# Patient Record
Sex: Female | Born: 1995 | Race: Black or African American | Hispanic: No | Marital: Single | Smoking: Never smoker
Health system: Southern US, Community
[De-identification: ages and names within clinical notes are randomized; demographics above are authoritative.]

## PROBLEM LIST (undated history)

## (undated) DIAGNOSIS — I10 Essential (primary) hypertension: Secondary | ICD-10-CM

## (undated) DIAGNOSIS — M5430 Sciatica, unspecified side: Secondary | ICD-10-CM

## (undated) DIAGNOSIS — D508 Other iron deficiency anemias: Secondary | ICD-10-CM

## (undated) DIAGNOSIS — E119 Type 2 diabetes mellitus without complications: Secondary | ICD-10-CM

## (undated) DIAGNOSIS — F329 Major depressive disorder, single episode, unspecified: Secondary | ICD-10-CM

## (undated) DIAGNOSIS — N926 Irregular menstruation, unspecified: Secondary | ICD-10-CM

## (undated) DIAGNOSIS — F32A Depression, unspecified: Secondary | ICD-10-CM

## (undated) DIAGNOSIS — M2550 Pain in unspecified joint: Secondary | ICD-10-CM

## (undated) HISTORY — DX: Other iron deficiency anemias: D50.8

## (undated) HISTORY — DX: Major depressive disorder, single episode, unspecified: F32.9

## (undated) HISTORY — DX: Depression, unspecified: F32.A

## (undated) HISTORY — DX: Type 2 diabetes mellitus without complications: E11.9

## (undated) HISTORY — DX: Pain in unspecified joint: M25.50

## (undated) HISTORY — DX: Essential (primary) hypertension: I10

## (undated) HISTORY — DX: Irregular menstruation, unspecified: N92.6

---

## 2014-01-02 ENCOUNTER — Emergency Department: Payer: Self-pay | Admitting: Emergency Medicine

## 2014-01-02 LAB — URINALYSIS, COMPLETE
BACTERIA: NONE SEEN
Bilirubin,UR: NEGATIVE
Blood: NEGATIVE
GLUCOSE, UR: NEGATIVE mg/dL (ref 0–75)
Ketone: NEGATIVE
Nitrite: NEGATIVE
Ph: 6 (ref 4.5–8.0)
RBC,UR: NONE SEEN /HPF (ref 0–5)
SPECIFIC GRAVITY: 1.028 (ref 1.003–1.030)
WBC UR: 9 /HPF (ref 0–5)

## 2014-01-02 LAB — CBC
HCT: 41.4 % (ref 35.0–47.0)
HGB: 13.3 g/dL (ref 12.0–16.0)
MCH: 27.9 pg (ref 26.0–34.0)
MCHC: 32.1 g/dL (ref 32.0–36.0)
MCV: 87 fL (ref 80–100)
Platelet: 265 10*3/uL (ref 150–440)
RBC: 4.75 10*6/uL (ref 3.80–5.20)
RDW: 15.5 % — ABNORMAL HIGH (ref 11.5–14.5)
WBC: 7.2 10*3/uL (ref 3.6–11.0)

## 2014-01-02 LAB — COMPREHENSIVE METABOLIC PANEL
ALBUMIN: 3.7 g/dL (ref 3.4–5.0)
ALT: 29 U/L
ANION GAP: 9 (ref 7–16)
AST: 22 U/L (ref 15–37)
Alkaline Phosphatase: 68 U/L
BILIRUBIN TOTAL: 0.3 mg/dL (ref 0.2–1.0)
BUN: 5 mg/dL — ABNORMAL LOW (ref 7–18)
CHLORIDE: 102 mmol/L (ref 98–107)
CO2: 25 mmol/L (ref 21–32)
CREATININE: 0.69 mg/dL (ref 0.60–1.30)
Calcium, Total: 8.5 mg/dL (ref 8.5–10.1)
EGFR (African American): >60
Glucose: 147 mg/dL — ABNORMAL HIGH (ref 65–99)
Osmolality: 272 (ref 275–301)
Potassium: 3.8 mmol/L (ref 3.5–5.1)
Sodium: 136 mmol/L (ref 136–145)
Total Protein: 7.3 g/dL (ref 6.4–8.2)

## 2014-01-02 LAB — HCG, QUANTITATIVE, PREGNANCY: Beta Hcg, Quant.: 26029 m[IU]/mL — ABNORMAL HIGH

## 2014-01-02 LAB — ETHANOL

## 2014-01-02 LAB — DRUG SCREEN, URINE

## 2014-01-02 LAB — WET PREP, GENITAL

## 2014-01-02 LAB — ACETAMINOPHEN LEVEL: Acetaminophen: <2 ug/mL

## 2014-01-02 LAB — GC/CHLAMYDIA PROBE AMP

## 2014-01-02 LAB — SALICYLATE LEVEL: Salicylates, Serum: <1.7 mg/dL

## 2014-01-02 LAB — TSH: THYROID STIMULATING HORM: 0.76 u[IU]/mL

## 2014-01-07 ENCOUNTER — Ambulatory Visit: Payer: Self-pay | Admitting: Nurse Practitioner

## 2014-01-11 ENCOUNTER — Ambulatory Visit: Payer: Self-pay | Admitting: Nurse Practitioner

## 2014-02-11 ENCOUNTER — Ambulatory Visit: Payer: Self-pay | Admitting: Nurse Practitioner

## 2014-02-20 ENCOUNTER — Encounter: Payer: Self-pay | Admitting: Maternal & Fetal Medicine

## 2014-03-05 ENCOUNTER — Emergency Department: Payer: Self-pay | Admitting: Emergency Medicine

## 2014-03-05 LAB — COMPREHENSIVE METABOLIC PANEL
AST: 34 U/L (ref 15–37)
Albumin: 3.3 g/dL — ABNORMAL LOW (ref 3.4–5.0)
Alkaline Phosphatase: 60 U/L
Anion Gap: 6 — ABNORMAL LOW (ref 7–16)
BILIRUBIN TOTAL: 0.3 mg/dL (ref 0.2–1.0)
BUN: 7 mg/dL (ref 7–18)
Calcium, Total: 8.3 mg/dL — ABNORMAL LOW (ref 8.5–10.1)
Chloride: 106 mmol/L (ref 98–107)
Co2: 24 mmol/L (ref 21–32)
Creatinine: 0.45 mg/dL — ABNORMAL LOW (ref 0.60–1.30)
Glucose: 103 mg/dL — ABNORMAL HIGH (ref 65–99)
Osmolality: 270 (ref 275–301)
POTASSIUM: 4.2 mmol/L (ref 3.5–5.1)
SGPT (ALT): 47 U/L
Sodium: 136 mmol/L (ref 136–145)
TOTAL PROTEIN: 6.8 g/dL (ref 6.4–8.2)

## 2014-03-05 LAB — CBC WITH DIFFERENTIAL/PLATELET
BASOS ABS: 0.1 10*3/uL (ref 0.0–0.1)
Basophil %: 0.5 %
EOS ABS: 0.1 10*3/uL (ref 0.0–0.7)
Eosinophil %: 1 %
HCT: 37.7 % (ref 35.0–47.0)
HGB: 11.9 g/dL — ABNORMAL LOW (ref 12.0–16.0)
LYMPHS ABS: 2.5 10*3/uL (ref 1.0–3.6)
Lymphocyte %: 21.8 %
MCH: 27.5 pg (ref 26.0–34.0)
MCHC: 31.7 g/dL — ABNORMAL LOW (ref 32.0–36.0)
MCV: 87 fL (ref 80–100)
Monocyte #: 0.5 x10 3/mm (ref 0.2–0.9)
Monocyte %: 4.7 %
NEUTROS ABS: 8.1 10*3/uL — AB (ref 1.4–6.5)
Neutrophil %: 72 %
PLATELETS: 190 10*3/uL (ref 150–440)
RBC: 4.35 10*6/uL (ref 3.80–5.20)
RDW: 15.3 % — ABNORMAL HIGH (ref 11.5–14.5)
WBC: 11.3 10*3/uL — ABNORMAL HIGH (ref 3.6–11.0)

## 2014-03-05 LAB — URINALYSIS, COMPLETE
BLOOD: NEGATIVE
Bilirubin,UR: NEGATIVE
GLUCOSE, UR: NEGATIVE mg/dL (ref 0–75)
Ketone: NEGATIVE
Leukocyte Esterase: NEGATIVE
NITRITE: NEGATIVE
Ph: 7 (ref 4.5–8.0)
Protein: NEGATIVE
RBC,UR: 2 /HPF (ref 0–5)
SPECIFIC GRAVITY: 1.02 (ref 1.003–1.030)
Squamous Epithelial: 5
WBC UR: 3 /HPF (ref 0–5)

## 2014-03-05 LAB — HCG, QUANTITATIVE, PREGNANCY: Beta Hcg, Quant.: 27397 m[IU]/mL — ABNORMAL HIGH

## 2014-03-13 ENCOUNTER — Ambulatory Visit: Payer: Self-pay | Admitting: Nurse Practitioner

## 2014-03-20 ENCOUNTER — Encounter: Payer: Self-pay | Admitting: Obstetrics & Gynecology

## 2014-03-23 ENCOUNTER — Emergency Department: Payer: Self-pay | Admitting: Emergency Medicine

## 2014-03-23 LAB — URINALYSIS, COMPLETE
BILIRUBIN, UR: NEGATIVE
Bacteria: NONE SEEN
Blood: NEGATIVE
Glucose,UR: NEGATIVE mg/dL (ref 0–75)
KETONE: NEGATIVE
LEUKOCYTE ESTERASE: NEGATIVE
NITRITE: NEGATIVE
PH: 6 (ref 4.5–8.0)
Protein: NEGATIVE
RBC,UR: 2 /HPF (ref 0–5)
Specific Gravity: 1.018 (ref 1.003–1.030)
Squamous Epithelial: 8
WBC UR: 4 /HPF (ref 0–5)

## 2014-03-23 LAB — WET PREP, GENITAL

## 2014-03-23 LAB — GC/CHLAMYDIA PROBE AMP

## 2014-03-25 LAB — URINE CULTURE

## 2014-04-03 ENCOUNTER — Encounter
Admit: 2014-04-03 | Disposition: A | Discharge status: Home / Self Care | Payer: Self-pay | Admitting: Obstetrics and Gynecology

## 2014-04-10 ENCOUNTER — Encounter: Payer: Self-pay | Admitting: Maternal & Fetal Medicine

## 2014-05-23 ENCOUNTER — Observation Stay: Payer: Self-pay | Admitting: Obstetrics and Gynecology

## 2014-05-23 LAB — CBC WITH DIFFERENTIAL/PLATELET
Basophil #: 0 10*3/uL (ref 0.0–0.1)
Basophil %: 0.3 %
Eosinophil #: 0.1 10*3/uL (ref 0.0–0.7)
Eosinophil %: 1.2 %
HCT: 34.2 % — ABNORMAL LOW (ref 35.0–47.0)
HGB: 11.1 g/dL — ABNORMAL LOW (ref 12.0–16.0)
Lymphocyte #: 1.8 10*3/uL (ref 1.0–3.6)
Lymphocyte %: 20 %
MCH: 28.4 pg (ref 26.0–34.0)
MCHC: 32.4 g/dL (ref 32.0–36.0)
MCV: 88 fL (ref 80–100)
Monocyte #: 0.5 x10 3/mm (ref 0.2–0.9)
Monocyte %: 5.5 %
NEUTROS ABS: 6.6 10*3/uL — AB (ref 1.4–6.5)
NEUTROS PCT: 73 %
Platelet: 194 10*3/uL (ref 150–440)
RBC: 3.9 10*6/uL (ref 3.80–5.20)
RDW: 14.5 % (ref 11.5–14.5)
WBC: 9 10*3/uL (ref 3.6–11.0)

## 2014-05-23 LAB — COMPREHENSIVE METABOLIC PANEL
ALK PHOS: 84 U/L
ANION GAP: 7 (ref 7–16)
Albumin: 2.6 g/dL — ABNORMAL LOW (ref 3.4–5.0)
BUN: 5 mg/dL — ABNORMAL LOW (ref 7–18)
Bilirubin,Total: 0.2 mg/dL (ref 0.2–1.0)
CO2: 24 mmol/L (ref 21–32)
Calcium, Total: 8.1 mg/dL — ABNORMAL LOW (ref 8.5–10.1)
Chloride: 108 mmol/L — ABNORMAL HIGH (ref 98–107)
Creatinine: 0.49 mg/dL — ABNORMAL LOW (ref 0.60–1.30)
EGFR (African American): >60
EGFR (Non-African Amer.): >60
Glucose: 85 mg/dL (ref 65–99)
Osmolality: 274 (ref 275–301)
Potassium: 3.8 mmol/L (ref 3.5–5.1)
SGOT(AST): 25 U/L (ref 15–37)
SGPT (ALT): 29 U/L
Sodium: 139 mmol/L (ref 136–145)
Total Protein: 6.3 g/dL — ABNORMAL LOW (ref 6.4–8.2)

## 2014-05-23 LAB — URINALYSIS, COMPLETE
Bilirubin,UR: NEGATIVE
Blood: NEGATIVE
Glucose,UR: NEGATIVE mg/dL (ref 0–75)
Ketone: NEGATIVE
Leukocyte Esterase: NEGATIVE
Nitrite: NEGATIVE
Ph: 7 (ref 4.5–8.0)
Protein: NEGATIVE
RBC,UR: 2 /HPF (ref 0–5)
Specific Gravity: 1.016 (ref 1.003–1.030)
Squamous Epithelial: 21
WBC UR: 2 /HPF (ref 0–5)

## 2014-06-18 ENCOUNTER — Observation Stay: Payer: Self-pay

## 2014-06-20 ENCOUNTER — Observation Stay: Payer: Self-pay | Admitting: Obstetrics & Gynecology

## 2014-06-23 ENCOUNTER — Encounter: Payer: Self-pay | Admitting: Maternal & Fetal Medicine

## 2014-06-24 ENCOUNTER — Encounter: Payer: Self-pay | Admitting: Pediatric Cardiology

## 2014-06-24 DIAGNOSIS — O24313 Unspecified pre-existing diabetes mellitus in pregnancy, third trimester: Secondary | ICD-10-CM

## 2014-06-24 DIAGNOSIS — O24919 Unspecified diabetes mellitus in pregnancy, unspecified trimester: Secondary | ICD-10-CM | POA: Insufficient documentation

## 2014-06-24 DIAGNOSIS — O24312 Unspecified pre-existing diabetes mellitus in pregnancy, second trimester: Secondary | ICD-10-CM | POA: Insufficient documentation

## 2014-06-24 HISTORY — DX: Unspecified pre-existing diabetes mellitus in pregnancy, third trimester: O24.313

## 2014-07-03 ENCOUNTER — Emergency Department: Payer: Self-pay | Admitting: Emergency Medicine

## 2014-07-04 LAB — CBC
HCT: 34.7 % — ABNORMAL LOW (ref 35.0–47.0)
HGB: 11.5 g/dL — ABNORMAL LOW (ref 12.0–16.0)
MCH: 28 pg (ref 26.0–34.0)
MCHC: 33.1 g/dL (ref 32.0–36.0)
MCV: 84 fL (ref 80–100)
PLATELETS: 255 10*3/uL (ref 150–440)
RBC: 4.11 10*6/uL (ref 3.80–5.20)
RDW: 14.7 % — AB (ref 11.5–14.5)
WBC: 9.5 10*3/uL (ref 3.6–11.0)

## 2014-07-04 LAB — COMPREHENSIVE METABOLIC PANEL
ALBUMIN: 3 g/dL — AB (ref 3.4–5.0)
ANION GAP: 8 (ref 7–16)
AST: 27 U/L (ref 15–37)
Alkaline Phosphatase: 132 U/L — ABNORMAL HIGH
BUN: 7 mg/dL (ref 7–18)
Bilirubin,Total: 0.2 mg/dL (ref 0.2–1.0)
Calcium, Total: 8.7 mg/dL (ref 8.5–10.1)
Chloride: 105 mmol/L (ref 98–107)
Co2: 24 mmol/L (ref 21–32)
Creatinine: 0.6 mg/dL (ref 0.60–1.30)
EGFR (African American): >60
EGFR (Non-African Amer.): >60
GLUCOSE: 112 mg/dL — AB (ref 65–99)
Osmolality: 273 (ref 275–301)
Potassium: 4.1 mmol/L (ref 3.5–5.1)
SGPT (ALT): 33 U/L
Sodium: 137 mmol/L (ref 136–145)
TOTAL PROTEIN: 6.8 g/dL (ref 6.4–8.2)

## 2014-07-04 LAB — URINALYSIS, COMPLETE
Bilirubin,UR: NEGATIVE
Blood: NEGATIVE
GLUCOSE, UR: NEGATIVE mg/dL (ref 0–75)
Nitrite: NEGATIVE
Ph: 6 (ref 4.5–8.0)
Protein: 30
RBC,UR: NONE SEEN /HPF (ref 0–5)
SPECIFIC GRAVITY: 1.025 (ref 1.003–1.030)
Squamous Epithelial: 26

## 2014-07-04 LAB — LIPASE, BLOOD: Lipase: 119 U/L (ref 73–393)

## 2014-07-18 ENCOUNTER — Observation Stay: Payer: Self-pay | Admitting: Obstetrics and Gynecology

## 2014-07-18 LAB — URINALYSIS, COMPLETE
BACTERIA: NONE SEEN
BILIRUBIN, UR: NEGATIVE
BLOOD: NEGATIVE
Leukocyte Esterase: NEGATIVE
Nitrite: NEGATIVE
Ph: 6 (ref 4.5–8.0)
Protein: NEGATIVE
Specific Gravity: 1.018 (ref 1.003–1.030)
Squamous Epithelial: 5
WBC UR: 2 /HPF (ref 0–5)

## 2014-07-30 ENCOUNTER — Observation Stay: Payer: Self-pay

## 2014-08-04 ENCOUNTER — Inpatient Hospital Stay: Payer: Self-pay

## 2014-08-06 DIAGNOSIS — O1490 Unspecified pre-eclampsia, unspecified trimester: Secondary | ICD-10-CM

## 2014-09-15 LAB — HM PAP SMEAR

## 2014-09-20 ENCOUNTER — Emergency Department
Admit: 2014-09-20 | Disposition: A | Discharge status: Home / Self Care | Payer: Self-pay | Admitting: Emergency Medicine

## 2014-09-20 LAB — BASIC METABOLIC PANEL
ANION GAP: 7 (ref 7–16)
BUN: 9 mg/dL
CALCIUM: 8.6 mg/dL — AB
Chloride: 106 mmol/L
Co2: 26 mmol/L
Creatinine: 0.75 mg/dL
EGFR (Non-African Amer.): >60
GLUCOSE: 144 mg/dL — AB
POTASSIUM: 3.8 mmol/L
SODIUM: 139 mmol/L

## 2014-09-20 LAB — CBC
HCT: 31.7 % — AB (ref 35.0–47.0)
HGB: 9.9 g/dL — AB (ref 12.0–16.0)
MCH: 24 pg — AB (ref 26.0–34.0)
MCHC: 31.1 g/dL — AB (ref 32.0–36.0)
MCV: 77 fL — AB (ref 80–100)
Platelet: 283 10*3/uL (ref 150–440)
RBC: 4.12 10*6/uL (ref 3.80–5.20)
RDW: 17.6 % — ABNORMAL HIGH (ref 11.5–14.5)
WBC: 5.9 10*3/uL (ref 3.6–11.0)

## 2014-09-20 LAB — TROPONIN I: Troponin-I: <0.03 ng/mL

## 2014-09-21 ENCOUNTER — Emergency Department
Admit: 2014-09-21 | Disposition: A | Discharge status: Home / Self Care | Payer: Self-pay | Admitting: Emergency Medicine

## 2014-09-21 LAB — CBC
HCT: 34 % — ABNORMAL LOW (ref 35.0–47.0)
HGB: 10.6 g/dL — ABNORMAL LOW (ref 12.0–16.0)
MCH: 24.2 pg — AB (ref 26.0–34.0)
MCHC: 31.2 g/dL — ABNORMAL LOW (ref 32.0–36.0)
MCV: 78 fL — ABNORMAL LOW (ref 80–100)
PLATELETS: 302 10*3/uL (ref 150–440)
RBC: 4.38 10*6/uL (ref 3.80–5.20)
RDW: 17.5 % — ABNORMAL HIGH (ref 11.5–14.5)
WBC: 4.6 10*3/uL (ref 3.6–11.0)

## 2014-09-30 ENCOUNTER — Ambulatory Visit
Admit: 2014-09-30 | Disposition: A | Discharge status: Home / Self Care | Payer: Self-pay | Attending: Family Medicine | Admitting: Family Medicine

## 2014-10-04 NOTE — Consult Note (Signed)
Referral Information:  Reason for Referral 19 yo G1 at 13w gestation with EDD 08/28/13 by US performed at [facility] on 01/03/14; measurements c/w 6w.  Pt has h/o T2 DM diagnosed 1/15 following primary care MD evaluation--had Hgb A1c of 7.1% at that time.  Reports normal opthalmologic exam within last year, no h/o renal dz or hypertension.  She was initially started on glyburide 2.5mg  at HS during pregnancy and stopped medication due to nightime lows (50s).  She did not bring log today but reports fastings below 95 and postpraindials 115-120s. [facility] education.   Referring Physician [facility]   Prenatal Hx She reports first trimester screening earlier this week. She reports initiation of exercise (walking) during pregnancy   Past Obstetrical Hx G1   Home Medications: Medication Instructions Status  MetroGel-Vaginal 0.75% vaginal gel with applicator 1 applicatorful vaginal once a day (at bedtime) Active  Prenatal gummy 1  orally 2 times a day Active   Allergies:   No Known Allergies:   Vital Signs/Notes:  Nursing Vital Signs: **Vital Signs.:   10-Sep-15 09:04  Vital Signs Type Routine  Temperature Temperature (F) 98.3  Celsius 36.8  Temperature Source oral  Pulse Pulse 87  Respirations Respirations 18  Systolic BP Systolic BP 128  Diastolic BP (mmHg) Diastolic BP (mmHg) 72  Mean BP 90  Pulse Ox % Pulse Ox % 100  Pulse Ox Activity Level  At rest  Oxygen Delivery Room Air/ 21 %   Perinatal Consult:  PMed Hx Rubella Immune, Hx of varicella   FHx metabolic disorders either side, T2DM- "everybody"--mother, father, grandparents,   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, nature at [employer]   Occupation Father works in Naval architect   Soc Hx single, no etoh,  ilicit drug use or tobacco use   Review Of Systems:  Medications/Allergies Reviewed Medications/Allergies reviewed    Additional Lab/Radiology Notes BSUS: single  live IUP today Opositive,   Impression/Recommendations:  Impression 19 yo G1 at 13 weeks with Type 2 DM(no end organ dysfunction)and maternal bmi 35 we addressed concerns related to pregestational diabetes including risk for fetal/neonatal complications including risk for  congenital anomalies, potential birth trauma/shoulder dystocia and stillbirth.     A hemoglobin A1c level below 8% is associated with an approximate 3% risk of fetal congenital anomalies.     --She was familiar with these neonatal risks due to her [facility] discussions [facility].  We also addressed the risk for hypertensive disorders and need iatrogenic preterm delivery in the stting of  poorly controlled maternal diabetes in pregnancy and/or fetopathy or the onset of hypertensive disorders. We encouraged her to maintain good glycemic control to lower the risk of these adverse outcomes.  We also addressed the recommendations for maternal weight gain (<15lbs) and encouraged her to continue her exercise regimen (walking).   --We reviewed the risk factors for preeclampsia(her risk factors include obesity and pregestational diabetes), how the dx is made and the treatment (delivery).  We also addressed the use of low dose aspirin for preeclampsia risk reduction.   Recommendations 1. [facility]--she received nutrition counseling there 2. She did not bring log book today but will record blood glucose values and will call us next week to review glycemic control 3.  Adjustment of diet to maintain fastings <95 mg/dl and one-hour post-prandials <140 mg/dl.  If there are any abnormalities with her glycemic control, we will initiate glyburide  as necessary.   4.  Baseline P: C ratio and, if abnormal, a 24 hour urine collection for protein and creatinine clearance; this should be repeated each trimester if there are any abnormalities on the first test or should her clinical status change at any  time during the pregnancy 5. Baseline creatine, liver function testing, and platelets, if not already performed. 6. TSH 7. Baseline EKG; maternal echocardiogram should be considered if the patient has an abnormal EKG or cardiac symptoms 8. Fetal echocardiogram at 22 weeks (this appointment has been scheduled)  9. Antepartum testing and growth assessment recommendations are outlined below.   10.Delivery should be considered between 37 and 40 weeks.  Consider delivery at 37-38 weeks if poor glycemic control or if fetopathy present.  If good glycemic control and no medications, delivery can occur by 40w. 11. Recommended initiation of low dose aspirin for preeclampsia risk reduction **[facility](in conjunction with follow up consultation)--pt aware ultrasound appt may be cancelled if [facility]   Plan:  Genetic Counseling no   Prenatal Diagnosis Options Level II US   Ultrasound at what gestational ages Monthly >24 weeks   Antepartum Testing Starting at 32 weeks, Weekly, begin twice weekly testing at ~35 weeks   Additional Testing Thyroid panel, Folate/prenatal vitamins   Delivery at what gestational age [redacted] weeks, sooner if fetopathy or poor glycemic control present    Total Time Spent with Patient 30 minutes   >50% of visit spent in couseling/coordination of care yes   Office Use Only [id number]  Level 2 (30min) NEW office consult exp prob focused   Coding Description: MATERNAL CONDITIONS/HISTORY INDICATION(S).   Diabetes - DM.   Obesity - BMI greater than equal to 30.  Electronic Signatures: Consuelo PandySmall, Stacy Sailer (MD)  (Signed 10-Sep-15 11:21)  Authored: Referral, Home Medications, Allergies, Vital Signs/Notes, Consult, Exam, Lab/Radiology Notes, Impression, Plan, Billing, Coding Description   Last Updated: 10-Sep-15 11:21 by Consuelo PandySmall, Shacarra Choe (MD)

## 2014-10-04 NOTE — Consult Note (Signed)
Referral Information:  Reason for Referral Follow-up MFM visit in setting of Type II diabetes   Referring Physician [facility]   Prenatal Hx Penny Gonzalez is a 19 year-old G1 P0 at 2616 6/7 weeks here for return visit in setting of type II DM. Her A1C early in pregnancy was 6.2%. She was seen by [facility] and had been on glyburide but was discontinued due to frequent low sugars. She is now just following a diet.  She has no complaints.   Home Medications: Medication Instructions Status  multivitamin, prenatal 2 tab(s) orally once a day Active   Allergies:   No Known Allergies:   Vital Signs/Notes:  Nursing Vital Signs: **Vital Signs.:   08-Oct-15 10:11  Pulse Pulse 90  Systolic BP Systolic BP 122  Diastolic BP (mmHg) Diastolic BP (mmHg) 60    Additional Lab/Radiology Notes Accuchecks: FS: 102, 86, 108, 74, 80, 87, 90 2hr PP: Non recorded   Impression/Recommendations:  Impression 19 year-old G1 P0 at 916 6/7 weeks with type II diabetes. See full MFM consult dated 02/20/14. Penny Gonzalez has been seen by [facility] and reports that she is following her diet. She has been recording her Fasting sugars but not 2hr PP. Some of her fastings are elevated but without seeing her other values, I am not comfortable adding back medication.  We reviewed the importance of following her diet and recording her sugars.  All questions answered. See US report from today Return in 2 weeks for MFM consult to review sugar log and US to complete anatomy survey Fetal echo is scheduled for Apr 22, 2014   Electronic Signatures: Penny Gonzalez, Penny Gonzalez (MD)  (Signed 08-Oct-15 11:58)  Authored: Referral, Home Medications, Allergies, Vital Signs/Notes, Lab/Radiology Notes, Impression   Last Updated: 08-Oct-15 11:58 by Penny Gonzalez, Penny Gonzalez (MD)

## 2014-10-06 LAB — SURGICAL PATHOLOGY

## 2014-10-12 NOTE — Consult Note (Signed)
Referral Information:  Reason for Referral 19 yo G1 at [redacted]w[redacted]d gestation with type 2 DM is here today for recommendations regarding BS management.    EDD 08/28/13 by US performed at [facility] on 01/03/14; measurements c/w 6w.  Pt has h/o T2 DM diagnosed 1/15 following primary care MD evaluation--had Hgb A1c of 7.1% at that time.  Reports normal opthalmologic exam within last year, no h/o renal dz or hypertension.  She was initially started on glyburide 2.5mg  at HS during pregnancy and stopped medication due to nightime lows (50s).  [facility] and diabetes education.  She is currently not taking any hypoglycemics for her diabetes.  She has stopped taking her low-dose aspirin.  She has missed  follow-up appointments [facility] at [facility].   Referring Physician [facility]   Prenatal Hx She reports normal first trimester screening.   Home Medications: Medication Instructions Status  multivitamin, prenatal 2 tab(s) orally once a day Active  Aspir 81 81 mg oral delayed release tablet 1 tab(s) orally once a day Active   Allergies:   No Known Allergies:   Vital Signs/Notes:  Nursing Vital Signs: **Vital Signs.:   11-Jan-16 14:00  Temperature Temperature (F) 98.2  Celsius 36.7  Pulse Pulse 111  Respirations Respirations 18  Systolic BP Systolic BP 140  Diastolic BP (mmHg) Diastolic BP (mmHg) 80  Mean BP 100  Pulse Ox % Pulse Ox % 99   Perinatal Consult:  PMed Hx Rubella Immune, Hx of varicella   FHx metabolic disorders either side, T2DM- "everybody"--mother, father, grandparents,   Occupation Mother working at [employer]   Occupation Father works in Naval architect   Soc Hx single, no etoh,  ilicit drug use or tobacco use   Review Of Systems:  Subjective Has a cold now, but it is getting better.   Fever/Chills No   Cough Yes   Abdominal Pain No   Diarrhea No   Constipation No   Nausea/Vomiting No   SOB/DOE No   Chest  Pain No   Dysuria No   Tolerating Diet Yes   Medications/Allergies Reviewed Medications/Allergies reviewed  Also taking Robitussin for her cold   Exam:  Today's Weight 257 lbs;BMI=39    Additional Lab/Radiology Notes Had [place] at [facility] in the last 2 weeks  FBSs   94-110 7/9 > 95 1-2 hour postprandials 91-158  12/15 >120   Impression/Recommendations:  Impression 19 yo G1 at [redacted]w[redacted]d gestation with Type 2 DM (no end organ dysfunction) and obesity previously counseled about the risks associated with diabetes and pregnancy now with fasting hyperglycemia   Recommendations 1. In addition to our original recommendations, I am recommending that she start metformin 500 mg po bid starting now.  This can be increased to 1000 mg po bid if necessary and should obviate the previous problems she had with hypoglycemia. 2. She has a fetal echo appt here tomorrow. 3. She was scheduled for return to [facility] in 2 weeks to review her BSs on metformin. 4. Fetal surveillance as outlined below.   Plan:  Ultrasound at what gestational ages Monthly >24 weeks   Antepartum Testing Starting at 32 weeks, Weekly, begin twice weekly testing at ~35 weeks    Delivery at what gestational age [redacted] weeks, sooner if fetopathy or poor glycemic control present    Total Time Spent with Patient 30 minutes   >50% of visit spent in couseling/coordination of care yes   Office Use Only [phone]  Office Visit Level 4 (25min) EST detailed office/outpt   Coding Description: MATERNAL CONDITIONS/HISTORY INDICATION(S).   Diabetes - DM.   Obesity - BMI greater than equal to 30.  Electronic Signatures: Lady DeutscherJames, Jamel Dunton (MD)  (Signed 11-Jan-16 15:15)  Authored: Referral, Home Medications, Allergies, Vital Signs/Notes, Consult, Exam, Lab/Radiology Notes, Impression, Plan, Billing, Coding Description   Last Updated: 11-Jan-16 15:15 by Lady DeutscherJames, Cordarrell Sane (MD)

## 2014-10-21 NOTE — H&P (Signed)
L&D Evaluation:  History:  HPI 19 yo G1 [facility] 136w3day [facility] with pregnancy complicated by T2DM with blood sugars not well controlled and polyhydraminos sent from office for a PIH evaluation. BP in office was 140s/90s and there was 3+proteinuria. She has had frontal headaches today, off and on and has had some scintillating scotomata yesterday. She is very swollen and has edema of her pannus of her abdomen and her lower extremities. She has gained 9# in 3 days. She reports having some contractions and had a bloody mucoid discharge yesterday.  No LOF, no frank VB, +FM. Very much wants to be induced because of increasing discomforts/pain. AFI today =24cm (has had as much as 36 cm).O POS/RI/VI/GBS positive  Last growth scan at 33 weeks with EFW=83% and AC>97th%. Patient has been very noncompliant with following diet and checking BS qid. Has done a better job of checking blood sugars in the last 2-3 weeks. Glyburide dose slowly increased over that time frame. TWG=57# (289#) NSTs have been reactive. Fetal echo1/13 with borderline left ventricular hypertrophy. First trimester test, MSAFP and CF all normal. +gbs bacteruria this pregnancy. LABS: O POS/RI/VI   Presents with elevated blood pressures and proteinuria   Patient's Medical History Obesity, DM II   Patient's Surgical History none   Medications Pre Natal Vitamins  glyburide 5 mg BID (was advised to increase to 7.5mg m BID, but did not because blood sugars were lower)   Allergies NKDA   Social History none   Family History Non-Contributory   ROS:  General positive for edema. No fever   HEENT positive for frontal headaches, scintillating scotomata,   GI normal, lower abdominal pain with ctx and tightening, no RUQ pain   GU normal, bloody discharge yesterday, neg dysuria or LOF   Resp normal   CV normal, No CP   Renal normal   MS sciatic type pain on right, difficulty moving legs   Exam:  Vital Signs 113/76, 127/99,  127/67, 112/85, 125/74   Urine Protein 3+, in office   General no apparent distress   Mental Status clear   Chest clear   Heart normal sinus rhythm, no murmur/gallop/rubs   Abdomen gravid, tender abdomen due to edema, NO RUQ tenderness   Estimated Fetal Weight 8#   Fetal Position cephalic   Edema 2+  3+   Reflexes 2+   Pelvic no external lesions, 2/70%/-2 and ballotable   Mebranes Intact   FHT normal rate with no decels, 150s with accels to 170s-Cat1   Ucx irregular, q3-7 min apart   Skin peau d'orange skin on abdomen   Impression:  Impression IUP at 36.3 weeks with elevated blood pressure and proteinuria in office. Uncontrolled T2DM with fetopathy   Plan:  Plan EFM/NST, monitor contractions and for cervical change, monitor BP, PIH panel   Electronic Signatures: Trinna BalloonGutierrez, Ammiel Guiney L (CNM)  (Signed 23-Feb-16 00:55)  Authored: L&D Evaluation   Last Updated: 23-Feb-16 00:55 by Trinna BalloonGutierrez, Quitman Norberto L (CNM)

## 2014-10-21 NOTE — H&P (Signed)
L&D Evaluation:  History:  HPI 19 yo G1 at 2858w0dby EDC of 08/28/2013 with pregnancy complicated by DM II presenting with cramping.  No LOF, no VB, +FM.  BG this AM 80.   Presents with abdominal pain   Patient's Medical History Obesity, DM II   Patient's Surgical History none   Medications Pre Natal Vitamins  glyburide 2.5mg  qAM, 5mg  qPM   Allergies NKDA   Social History none   Family History Non-Contributory   Exam:  Vital Signs stable    Urine Protein not completed   General no apparent distress   Mental Status clear    Chest no increased    Abdomen gravid, non-tender   Estimated Fetal Weight Average for gestational age   Fetal Position vtx   Back no CVAT   Edema no edema    FHT normal rate with no decels, 160, moderate, +accels, no decels - tracing not continous because of fetal movement   Impression:  Impression 19 yo G1 at 3058w0d presenting with cramping   Plan:  Comments 1) Abdominal pain and cramping  - UA negative - discussed supportive measures for cramping and discomforts of pregnancy which were discussed with patient at 07/16/14 office visit  2) Fetus- category I tracing with reactive NST yesterday in clinic.  Will wait for 20 minutes of continues tracing prior to discharge  3) O pos / ABSC neg / RI / VZI / HBsAg neg / RPR NR   4) Disposition - home with follow up in place on 07/21/13 [facility]   Electronic Signatures: Lorrene ReidStaebler, Lorence Nagengast M (MD)  (Signed 05-Feb-16 12:21)  Authored: L&D Evaluation   Last Updated: 05-Feb-16 12:21 by Lorrene ReidStaebler, Channah Godeaux M (MD)
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
## 2014-10-21 NOTE — H&P (Signed)
L&D Evaluation:  History:  HPI 19 [facility] with pregnancy complicated by T2DM with blood sugars not well controlled and polyhydraminos presenting with multiple complaints including cramping, sciatica on right, swelling in LE with Lt>RT, and dizziness No LOF, no VB, +FM. Very much wants to be induced because of increasing discomforts/pain. AFI today =36cm. Last growth scan at 33 weeks with EFW=83% and AC>97th%. Patient has been very noncompliant with following diet and checking BS qid. Has done a better job of checking blood sugars in the last 2-3 weeks. Glyburide dose slowly increased over that time frame. TWG=35# NSTs have been reactive. Fetal echo1/13 with borderline left ventricular hypertrophy. First trimester test, MSAFP and CF all normal. +gbs bacteruria this pregnancy. LABS:   Presents with abdominal pain, swelling in LE, dizziness   Patient's Medical History Obesity, DM II   Patient's Surgical History none   Medications Pre Natal Vitamins  glyburide 7.5mg  BID (just increased this AM)   Allergies NKDA   Social History none   Family History Non-Contributory   ROS:  ROS see above   Exam:  Vital Signs 140/80, 133/78   Urine Protein 1+   General no apparent distress   Mental Status clear   Chest clear    Heart normal sinus rhythm, no murmur/gallop/rubs   Abdomen gravid, non-tender, tender around umbilicus. edema present in skin of lower abd   Fetal Position cephalic on US   Edema 2+  3+  Nonpitting  Lt>RT   Reflexes 2+   Pelvic no external lesions, FT/50%/-2 to -3   Mebranes Intact   FHT normal rate with no decels, 150s with accels to 170s to 180s   Ucx irregular, mild   Skin dry   Impression:  Impression IUP at 35 5/7 weeks with DM T2, not well controlled. Polyhydraminos. Edema of lower extremitie probably due to polyhydraminos.  Mildly elevated blood pressures intially with proteinuria. R/O preeclampsia   Plan:  Plan  EFM/NST, monitor BP, PIH panel   Electronic Signatures: Farrel ConnersGutierrez, Kaylon Laroche L (CNM)  (Signed 17-Feb-16 23:17)  Authored: L&D Evaluation   Last Updated: 17-Feb-16 23:17 by Trinna BalloonGutierrez, Wyoma Genson L (CNM)

## 2014-11-14 ENCOUNTER — Telehealth: Payer: Self-pay | Admitting: Family Medicine

## 2014-11-14 DIAGNOSIS — M545 Low back pain, unspecified: Secondary | ICD-10-CM

## 2014-11-14 DIAGNOSIS — G8929 Other chronic pain: Secondary | ICD-10-CM

## 2014-11-14 MED ORDER — NAPROXEN 500 MG PO TABS: 500.0000 mg | ORAL_TABLET | Freq: Two times a day (BID) | ORAL | Status: DC

## 2014-11-14 NOTE — Telephone Encounter (Signed)
Tried calling the patient twice and i get a recording that says the patient is not accepting calls at this time.

## 2014-11-14 NOTE — Telephone Encounter (Signed)
Pt is requesting return call. She is having pain in her back and leg. The pain mainly affects her at night. Please send to [email or web address].

## 2014-11-14 NOTE — Telephone Encounter (Signed)
Refilled her Naproxen 500mg  twice a day as needed for any pain.

## 2014-11-14 NOTE — Telephone Encounter (Signed)
Refilled her Naproxen.

## 2014-11-26 ENCOUNTER — Ambulatory Visit (INDEPENDENT_AMBULATORY_CARE_PROVIDER_SITE_OTHER): Payer: Medicaid Other | Admitting: Family Medicine

## 2014-11-26 ENCOUNTER — Encounter: Payer: Self-pay | Admitting: Family Medicine

## 2014-11-26 VITALS — BP 122/74 | HR 102 | Temp 98.4°F | Resp 18 | Ht 68.0 in | Wt 239.1 lb

## 2014-11-26 DIAGNOSIS — Z202 Contact with and (suspected) exposure to infections with a predominantly sexual mode of transmission: Secondary | ICD-10-CM | POA: Insufficient documentation

## 2014-11-26 DIAGNOSIS — E669 Obesity, unspecified: Secondary | ICD-10-CM | POA: Diagnosis not present

## 2014-11-26 DIAGNOSIS — N926 Irregular menstruation, unspecified: Secondary | ICD-10-CM

## 2014-11-26 DIAGNOSIS — E119 Type 2 diabetes mellitus without complications: Secondary | ICD-10-CM | POA: Diagnosis not present

## 2014-11-26 DIAGNOSIS — M2550 Pain in unspecified joint: Secondary | ICD-10-CM | POA: Insufficient documentation

## 2014-11-26 DIAGNOSIS — E1165 Type 2 diabetes mellitus with hyperglycemia: Secondary | ICD-10-CM | POA: Insufficient documentation

## 2014-11-26 DIAGNOSIS — N912 Amenorrhea, unspecified: Secondary | ICD-10-CM | POA: Insufficient documentation

## 2014-11-26 DIAGNOSIS — R51 Headache: Secondary | ICD-10-CM

## 2014-11-26 DIAGNOSIS — R519 Headache, unspecified: Secondary | ICD-10-CM | POA: Insufficient documentation

## 2014-11-26 DIAGNOSIS — E1169 Type 2 diabetes mellitus with other specified complication: Secondary | ICD-10-CM | POA: Insufficient documentation

## 2014-11-26 LAB — POCT URINE PREGNANCY: PREG TEST UR: NEGATIVE

## 2014-11-26 NOTE — Progress Notes (Signed)
Name: Penny Gonzalez   MRN: [id number]    DOB: January 18, 1996   Date:11/26/2014       Progress Note  Subjective  Chief Complaint  Chief Complaint  Patient presents with  . Possible Pregnancy  . Muscle Pain    patient has been taking Tylenol but it has not helped.    HPI  Joint/Muscle Pain: Patient complains of arthralgias and myalgias for which has been present for several months. Pain is located in multiple joints such as lower back more to the left with hip pain and bilaterally thigh pain. It is described as aching, and is constant .  Associated symptoms include: none.  The patient has has had no relief from NSAIDs.  Related to injury:   No.  Patient is here for routine follow up of Diabetes Type II.  Current diabetes medication regimen includes lifestyle changes, weight loss, diet modification. Patient is not adhering to recommended therapy as instructed. Overall the patient feels that their blood glucose is not well controlled. Checking blood glucose 0 times.   Related symptoms? increased fatigue Any hypoglycemic symptoms?  No  Last Diabetic Eye Exam? None Checking feet daily? No  Amenorrhea: Patient complains of amenorrhea. Currently periods are occurring every a few months.   Bleeding is moderate.  Periods were not regular in the past occurring every a few months. Patient has recently had her first baby. Is there a chance of pregnancy? Yes. Factors that may be contributory to menstrual abnormalities include hormonal factors recently having baby, started on OCPs 1 month ago. Previous treatments for menstrual abnormalities include oral contraceptive pills, somewhat effective.       Patient Active Problem List   Diagnosis Date Noted  . Diabetes mellitus type 2, controlled 11/26/2014  . Headache disorder 11/26/2014  . Arthralgia of multiple joints 11/26/2014  . Obesity, Class II, BMI 35-39.9 11/26/2014  . Contact with and suspected exposure to infections with predominantly  sexual mode of transmission 11/26/2014  . Missed period 11/26/2014  . Diabetes in pregnancy 06/24/2014    History  Substance Use Topics  . Smoking status: Never Smoker   . Smokeless tobacco: Not on file  . Alcohol Use: No     Current outpatient prescriptions:  .  norethindrone-ethinyl estradiol (MICROGESTIN,JUNEL,LOESTRIN) 1-20 MG-MCG tablet, Take 1 tablet by mouth daily., Disp: , Rfl:  .  naproxen (NAPROSYN) 500 MG tablet, Take 1 tablet (500 mg total) by mouth 2 (two) times daily with a meal. (Patient not taking: Reported on 11/26/2014), Disp: 60 tablet, Rfl: 2  History reviewed. No pertinent past surgical history.  Family History  Problem Relation Age of Onset  . Diabetes Mother   . Arthritis Father   . Diabetes Father   . Heart disease Father   . Hypertension Father   . Stroke Father   . Hearing loss Sister   . Hypertension Brother   . Hypertension Paternal Grandfather     No Known Allergies   Review of Systems  Ten systems reviewed and is negative except as mentioned in HPI.     Objective  BP 122/74 mmHg  Pulse 102  Temp(Src) 98.4 F (36.9 C) (Oral)  Resp 18  Ht 5' 8" (1.727 m)  Wt 239 lb 1.6 oz (108.455 kg)  BMI 36.36 kg/m2  SpO2 98%  LMP 10/06/2014 (Approximate)  Physical Exam  Constitutional: Patient is obese and well-nourished. In no distress.  HEENT:  - Head: Normocephalic and atraumatic.  - Ears: Bilateral TMs gray, no erythema or effusion -  Nose: Nasal mucosa moist - Mouth/Throat: Oropharynx is clear and moist. No tonsillar hypertrophy or erythema. No post nasal drainage.  - Eyes: Conjunctivae clear, EOM movements normal. PERRLA. No scleral icterus.  Neck: Normal range of motion. Neck supple. No JVD present. No thyromegaly present.  Cardiovascular: Normal rate, regular rhythm and normal heart sounds.  No murmur heard.  Pulmonary/Chest: Effort normal and breath sounds normal. No respiratory distress. Musculoskeletal: Normal range of motion  bilateral UE and LE, no joint effusions. Peripheral vascular: Bilateral LE no edema. Neurological: CN II-XII grossly intact with no focal deficits. Alert and oriented to person, place, and time. Coordination, balance, strength, speech and gait are normal.  Skin: Skin is warm and dry. No rash noted. No erythema.  Psychiatric: Patient has a stable mood and affect. Behavior is normal in office today. Judgment and thought content normal in office today.   Recent Results (from the past 2160 hour(s))  Troponin I     Status: None   Collection Time: 09/20/14  3:14 AM  Result Value Ref Range   Troponin-I <0.03 ng/mL    Comment: 0.00-0.03 0.03 ng/mL or less: NEGATIVE  Repeat testing in 3-6 hrs  if clinically indicated. >0.05 ng/mL: POTENTIAL  MYOCARDIAL INJURY. Repeat  testing in 3-6 hrs if  clinically indicated. NOTE: An increase or decrease  of 30% or more on serial  testing suggests a  clinically important change NOTE: New Reference Range  08/19/14   CBC     Status: Abnormal   Collection Time: 09/20/14  3:14 AM  Result Value Ref Range   WBC 5.9 3.6-11.0 x10 3/mm 3   RBC 4.12 3.80-5.20 X10 6/mm 3   HGB 9.9 (L) 12.0-16.0 g/dL   HCT 31.7 (L) 35.0-47.0 %   MCV 77 (L) 80-100 fL   MCH 24.0 (L) 26.0-34.0 pg   MCHC 31.1 (L) 32.0-36.0 g/dL   RDW 17.6 (H) 11.5-14.5 %   Platelet 283 150-440 x10 3/mm 3  Basic metabolic panel     Status: Abnormal   Collection Time: 09/20/14  3:14 AM  Result Value Ref Range   Glucose, CSF DNP mg/dL    Comment: 65-99 NOTE: New Reference Range  08/19/14    BUN 9 mg/dL    Comment: 6-20 NOTE: New Reference Range  08/19/14    Creatinine 0.75 mg/dL    Comment: 0.44-1.00 NOTE: New Reference Range  08/19/14    Sodium, Urine Random DNP mmol/L    Comment: 135-145 NOTE: New Reference Range  08/19/14    Potassium, Urine Random DNP mmol/L    Comment: 3.5-5.1 NOTE: New Reference Range  08/19/14    Chloride, Urine Random DNP mmol/L    Comment:  101-111 NOTE: New Reference Range  08/19/14    Co2 26 mmol/L    Comment: 22-32 NOTE: New Reference Range  08/19/14    Calcium, Total 8.6 (L) mg/dL    Comment: 8.9-10.3 NOTE: New Reference Range  08/19/14    Anion Gap 7 7-16   EGFR (African American) >60    EGFR (Non-African Amer.) >60     Comment: eGFR values <60mL/min/1.73 m2 may be an indication of chronic kidney disease (CKD). Calculated eGFR is useful in patients with stable renal function. The eGFR calculation will not be reliable in acutely ill patients when serum creatinine is changing rapidly. It is not useful in patients on dialysis. The eGFR calculation may not be applicable to patients at the low and high extremes of body sizes, pregnant women, and vegetarians.      Glucose 144 (H) mg/dL    Comment: 65-99 NOTE: New Reference Range  08/19/14    Sodium 139 mmol/L    Comment: 135-145 NOTE: New Reference Range  08/19/14    Potassium 3.8 mmol/L    Comment: 3.5-5.1 NOTE: New Reference Range  08/19/14    Chloride 106 mmol/L    Comment: 101-111 NOTE: New Reference Range  08/19/14   CBC     Status: Abnormal   Collection Time: 09/21/14  9:49 AM  Result Value Ref Range   WBC 4.6 3.6-11.0 x10 3/mm 3   RBC 4.38 3.80-5.20 X10 6/mm 3   HGB 10.6 (L) 12.0-16.0 g/dL   HCT 34.0 (L) 35.0-47.0 %   MCV 78 (L) 80-100 fL   MCH 24.2 (L) 26.0-34.0 pg   MCHC 31.2 (L) 32.0-36.0 g/dL   RDW 17.5 (H) 11.5-14.5 %   Platelet 302 150-440 x10 3/mm 3  POCT urine pregnancy     Status: Normal   Collection Time: 11/26/14  8:43 AM  Result Value Ref Range   Preg Test, Ur Negative Negative     Assessment & Plan  1. Missed period Urine pregnancy in office negative. Will work up PCOS and serum B-Hcg to rule out early pregnancy.  - POCT urine pregnancy - B-HCG Quant - Prolactin - DHEA-sulfate  2. Arthralgia of multiple joints Rule out rheumatological propensity. OTC NSAIDs not helping. Avoid narcotics at such a young age. We  discussed possibly starting her on Celexa. Encouraged patient to attend PT and lose weight.  - Sedimentation rate - Rheumatoid factor - Antinuclear Antib (ANA)  3. Obesity, Class II, BMI 35-39.9 The patient has been counseled on their higher than normal BMI.  They have verbally expressed understanding their increased risk for other diseases.  In efforts to meet a better target BMI goal the patient has been counseled on lifestyle, diet and exercise modification tactics. Start with moderate intensity aerobic exercise (walking, jogging, elliptical, swimming, group or individual sports, hiking) at least 75mns a day at least 4 days a week and increase intensity, duration, frequency as tolerated. Diet should include well balance fresh fruits and vegetables avoiding processed foods, carbohydrates and sugars. Drink at least 8oz 10 glasses a day avoiding sodas, sugary fruit drinks, sweetened tea. Check weight on a reliable scale daily and monitor weight loss progress daily. Consider investing in mobile phone apps that will help keep track of weight loss goals.  - TSH - Prolactin - DHEA-sulfate  4. Diabetes mellitus type 2, controlled Due for routine blood work  - CBC with Differential/Platelet - COMPLETE METABOLIC PANEL WITH GFR - Lipid panel - HgB A1c - Prolactin - DHEA-sulfate

## 2014-11-26 NOTE — Progress Notes (Deleted)
Name: Penny Gonzalez   MRN: [id number]    DOB: 07/11/1995   Date:11/26/2014       Progress Note  Subjective  Chief Complaint  Chief Complaint  Patient presents with  . Possible Pregnancy  . Muscle Pain    patient has been taking Tylenol but it has not helped.    HPI  Possible Pregnancy  Muscle Pain     Patient Active Problem List   Diagnosis Date Noted  . Diabetes mellitus type 2, controlled 11/26/2014  . Headache disorder 11/26/2014  . Arthralgia of multiple joints 11/26/2014  . Obesity, Class II, BMI 35-39.9 11/26/2014  . Contact with and suspected exposure to infections with predominantly sexual mode of transmission 11/26/2014  . Missed period 11/26/2014  . Diabetes in pregnancy 06/24/2014    History  Substance Use Topics  . Smoking status: Never Smoker   . Smokeless tobacco: Not on file  . Alcohol Use: No     Current outpatient prescriptions:  .  norethindrone-ethinyl estradiol (MICROGESTIN,JUNEL,LOESTRIN) 1-20 MG-MCG tablet, Take 1 tablet by mouth daily., Disp: , Rfl:  .  naproxen (NAPROSYN) 500 MG tablet, Take 1 tablet (500 mg total) by mouth 2 (two) times daily with a meal. (Patient not taking: Reported on 11/26/2014), Disp: 60 tablet, Rfl: 2  History reviewed. No pertinent past surgical history.  Family History  Problem Relation Age of Onset  . Diabetes Mother   . Arthritis Father   . Diabetes Father   . Heart disease Father   . Hypertension Father   . Stroke Father   . Hearing loss Sister   . Hypertension Brother   . Hypertension Paternal Grandfather     No Known Allergies   Review of Systems  Ten systems reviewed and is negative except as mentioned in HPI.    Objective  BP 122/74 mmHg  Pulse 102  Temp(Src) 98.4 F (36.9 C) (Oral)  Resp 18  Ht 5' 8" (1.727 m)  Wt 239 lb 1.6 oz (108.455 kg)  BMI 36.36 kg/m2  SpO2 98%  LMP 10/06/2014 (Approximate)  Physical Exam  Constitutional: Patient is obese and well-nourished. In no  distress.  HEENT:  - Head: Normocephalic and atraumatic.  - Ears: Bilateral TMs gray, no erythema or effusion - Nose: Nasal mucosa moist - Mouth/Throat: Oropharynx is clear and moist. No tonsillar hypertrophy or erythema. No post nasal drainage.  - Eyes: Conjunctivae clear, EOM movements normal. PERRLA. No scleral icterus.  Neck: Normal range of motion. Neck supple. No JVD present. No thyromegaly present.  Cardiovascular: Normal rate, regular rhythm and normal heart sounds.  No murmur heard.  Pulmonary/Chest: Effort normal and breath sounds normal. No respiratory distress. Musculoskeletal: Normal range of motion bilateral UE and LE, no joint effusions. Peripheral vascular: Bilateral LE no edema. Neurological: CN II-XII grossly intact with no focal deficits. Alert and oriented to person, place, and time. Coordination, balance, strength, speech and gait are normal.  Skin: Skin is warm and dry. No rash noted. No erythema.  Psychiatric: Patient has stable mood. Behavior is normal in office today. Judgment and thought content normal in office today.   Recent Results (from the past 2160 hour(s))  Troponin I     Status: None   Collection Time: 09/20/14  3:14 AM  Result Value Ref Range   Troponin-I <0.03 ng/mL    Comment: 0.00-0.03 0.03 ng/mL or less: NEGATIVE  Repeat testing in 3-6 hrs  if clinically indicated. >0.05 ng/mL: POTENTIAL  MYOCARDIAL INJURY. Repeat  testing in 3-6   hrs if  clinically indicated. NOTE: An increase or decrease  of 30% or more on serial  testing suggests a  clinically important change NOTE: New Reference Range  08/19/14   CBC     Status: Abnormal   Collection Time: 09/20/14  3:14 AM  Result Value Ref Range   WBC 5.9 3.6-11.0 x10 3/mm 3   RBC 4.12 3.80-5.20 X10 6/mm 3   HGB 9.9 (L) 12.0-16.0 g/dL   HCT 31.7 (L) 35.0-47.0 %   MCV 77 (L) 80-100 fL   MCH 24.0 (L) 26.0-34.0 pg   MCHC 31.1 (L) 32.0-36.0 g/dL   RDW 17.6 (H) 11.5-14.5 %   Platelet 283 150-440  x10 3/mm 3  Basic metabolic panel     Status: Abnormal   Collection Time: 09/20/14  3:14 AM  Result Value Ref Range   Glucose, CSF DNP mg/dL    Comment: 65-99 NOTE: New Reference Range  08/19/14    BUN 9 mg/dL    Comment: 6-20 NOTE: New Reference Range  08/19/14    Creatinine 0.75 mg/dL    Comment: 0.44-1.00 NOTE: New Reference Range  08/19/14    Sodium, Urine Random DNP mmol/L    Comment: 135-145 NOTE: New Reference Range  08/19/14    Potassium, Urine Random DNP mmol/L    Comment: 3.5-5.1 NOTE: New Reference Range  08/19/14    Chloride, Urine Random DNP mmol/L    Comment: 101-111 NOTE: New Reference Range  08/19/14    Co2 26 mmol/L    Comment: 22-32 NOTE: New Reference Range  08/19/14    Calcium, Total 8.6 (L) mg/dL    Comment: 8.9-10.3 NOTE: New Reference Range  08/19/14    Anion Gap 7 7-16   EGFR (African American) >60    EGFR (Non-African Amer.) >60     Comment: eGFR values <68m/min/1.73 m2 may be an indication of chronic kidney disease (CKD). Calculated eGFR is useful in patients with stable renal function. The eGFR calculation will not be reliable in acutely ill patients when serum creatinine is changing rapidly. It is not useful in patients on dialysis. The eGFR calculation may not be applicable to patients at the low and high extremes of body sizes, pregnant women, and vegetarians.    Glucose 144 (H) mg/dL    Comment: 65-99 NOTE: New Reference Range  08/19/14    Sodium 139 mmol/L    Comment: 135-145 NOTE: New Reference Range  08/19/14    Potassium 3.8 mmol/L    Comment: 3.5-5.1 NOTE: New Reference Range  08/19/14    Chloride 106 mmol/L    Comment: 101-111 NOTE: New Reference Range  08/19/14   CBC     Status: Abnormal   Collection Time: 09/21/14  9:49 AM  Result Value Ref Range   WBC 4.6 3.6-11.0 x10 3/mm 3   RBC 4.38 3.80-5.20 X10 6/mm 3   HGB 10.6 (L) 12.0-16.0 g/dL   HCT 34.0 (L) 35.0-47.0 %   MCV 78 (L) 80-100 fL   MCH  24.2 (L) 26.0-34.0 pg   MCHC 31.2 (L) 32.0-36.0 g/dL   RDW 17.5 (H) 11.5-14.5 %   Platelet 302 150-440 x10 3/mm 3  POCT urine pregnancy     Status: Normal   Collection Time: 11/26/14  8:43 AM  Result Value Ref Range   Preg Test, Ur Negative Negative     Assessment & Plan

## 2014-11-27 ENCOUNTER — Other Ambulatory Visit: Payer: Self-pay | Admitting: Family Medicine

## 2014-11-27 LAB — CBC WITH DIFFERENTIAL/PLATELET
BASOS ABS: 0 10*3/uL (ref 0.0–0.2)
Basos: 0 %
EOS (ABSOLUTE): 0.1 10*3/uL (ref 0.0–0.4)
Eos: 2 %
Hematocrit: 33.2 % — ABNORMAL LOW (ref 34.0–46.6)
Hemoglobin: 10.6 g/dL — ABNORMAL LOW (ref 11.1–15.9)
IMMATURE GRANS (ABS): 0 10*3/uL (ref 0.0–0.1)
Immature Granulocytes: 0 %
LYMPHS ABS: 2.4 10*3/uL (ref 0.7–3.1)
Lymphs: 46 %
MCH: 22.9 pg — ABNORMAL LOW (ref 26.6–33.0)
MCHC: 31.9 g/dL (ref 31.5–35.7)
MCV: 72 fL — AB (ref 79–97)
MONOCYTES: 6 %
Monocytes Absolute: 0.3 10*3/uL (ref 0.1–0.9)
NEUTROS ABS: 2.3 10*3/uL (ref 1.4–7.0)
Neutrophils: 46 %
PLATELETS: 300 10*3/uL (ref 150–379)
RBC: 4.62 x10E6/uL (ref 3.77–5.28)
RDW: 18.4 % — AB (ref 12.3–15.4)
WBC: 5 10*3/uL (ref 3.4–10.8)

## 2014-11-27 LAB — SEDIMENTATION RATE: SED RATE: 16 mm/h (ref 0–32)

## 2014-11-27 LAB — HEMOGLOBIN A1C
Est. average glucose Bld gHb Est-mCnc: 146 mg/dL
HEMOGLOBIN A1C: 6.7 % — AB (ref 4.8–5.6)

## 2014-11-28 LAB — CMP14+EGFR
ALK PHOS: 89 IU/L (ref 39–117)
ALT: 13 IU/L (ref 0–32)
AST: 16 IU/L (ref 0–40)
Albumin/Globulin Ratio: 1.7 (ref 1.1–2.5)
Albumin: 4.4 g/dL (ref 3.5–5.5)
BUN / CREAT RATIO: 10 (ref 8–20)
BUN: 7 mg/dL (ref 6–20)
Bilirubin Total: <0.2 mg/dL (ref 0.0–1.2)
CALCIUM: 9.2 mg/dL (ref 8.7–10.2)
CHLORIDE: 100 mmol/L (ref 97–108)
CO2: 22 mmol/L (ref 18–29)
CREATININE: 0.69 mg/dL (ref 0.57–1.00)
GFR calc Af Amer: 144 mL/min/{1.73_m2} (ref >59)
GFR calc non Af Amer: 125 mL/min/{1.73_m2} (ref >59)
GLOBULIN, TOTAL: 2.6 g/dL (ref 1.5–4.5)
Glucose: 124 mg/dL — ABNORMAL HIGH (ref 65–99)
Potassium: 4.1 mmol/L (ref 3.5–5.2)
Sodium: 138 mmol/L (ref 134–144)
TOTAL PROTEIN: 7 g/dL (ref 6.0–8.5)

## 2014-11-28 LAB — TSH: TSH: 1.12 u[IU]/mL (ref 0.450–4.500)

## 2014-11-28 LAB — LIPID PANEL
CHOLESTEROL TOTAL: 152 mg/dL (ref 100–199)
Chol/HDL Ratio: 2.5 ratio units (ref 0.0–4.4)
HDL: 60 mg/dL (ref >39)
LDL Calculated: 69 mg/dL (ref 0–99)
TRIGLYCERIDES: 117 mg/dL (ref 0–149)
VLDL Cholesterol Cal: 23 mg/dL (ref 5–40)

## 2014-11-28 LAB — ANA: Anti Nuclear Antibody(ANA): NEGATIVE

## 2014-11-28 LAB — RHEUMATOID FACTOR: Rhuematoid fact SerPl-aCnc: 11.3 IU/mL (ref 0.0–13.9)

## 2014-11-28 LAB — DHEA-SULFATE: DHEA-SO4: 144.7 ug/dL (ref 110.0–431.7)

## 2014-11-28 LAB — PROLACTIN: PROLACTIN: 40.9 ng/mL — AB (ref 4.8–23.3)

## 2014-12-01 ENCOUNTER — Encounter: Payer: Self-pay | Admitting: Family Medicine

## 2014-12-01 ENCOUNTER — Telehealth: Payer: Self-pay | Admitting: Family Medicine

## 2014-12-01 DIAGNOSIS — E119 Type 2 diabetes mellitus without complications: Secondary | ICD-10-CM

## 2014-12-01 DIAGNOSIS — D508 Other iron deficiency anemias: Secondary | ICD-10-CM

## 2014-12-01 HISTORY — DX: Other iron deficiency anemias: D50.8

## 2014-12-01 MED ORDER — FERROUS SULFATE 325 (65 FE) MG PO TABS: 325.0000 mg | ORAL_TABLET | Freq: Every day | ORAL | Status: DC

## 2014-12-01 MED ORDER — METFORMIN HCL 500 MG PO TABS
500.0000 mg | ORAL_TABLET | Freq: Every day | ORAL | Status: DC
Start: 2014-12-01 — End: 2015-02-18

## 2014-12-01 NOTE — Telephone Encounter (Signed)
Contacted [facility], spoke with Chilton Si, and she stated that there was an error with the order on their part so she was adding on the Beta HCG and will fax those results along with the CMP once completed

## 2014-12-01 NOTE — Telephone Encounter (Signed)
Please contact [facility] regarding missing lab work results from last week: CMP and serum Beta HCG quantity to make sure she is not pregnant.

## 2014-12-02 LAB — BETA HCG QUANT (REF LAB)

## 2014-12-02 LAB — SPECIMEN STATUS REPORT

## 2014-12-02 NOTE — Telephone Encounter (Signed)
Got results for Bhcg, negative.

## 2014-12-05 ENCOUNTER — Emergency Department
Admission: EM | Admit: 2014-12-05 | Discharge: 2014-12-06 | Disposition: A | Discharge status: Home / Self Care | Payer: Medicaid Other | Attending: Student | Admitting: Student

## 2014-12-05 ENCOUNTER — Emergency Department: Payer: Medicaid Other

## 2014-12-05 ENCOUNTER — Other Ambulatory Visit: Payer: Self-pay

## 2014-12-05 ENCOUNTER — Encounter: Payer: Self-pay | Admitting: Emergency Medicine

## 2014-12-05 DIAGNOSIS — Z79899 Other long term (current) drug therapy: Secondary | ICD-10-CM | POA: Insufficient documentation

## 2014-12-05 DIAGNOSIS — Z793 Long term (current) use of hormonal contraceptives: Secondary | ICD-10-CM | POA: Diagnosis not present

## 2014-12-05 DIAGNOSIS — E119 Type 2 diabetes mellitus without complications: Secondary | ICD-10-CM | POA: Insufficient documentation

## 2014-12-05 DIAGNOSIS — R509 Fever, unspecified: Secondary | ICD-10-CM

## 2014-12-05 DIAGNOSIS — R0789 Other chest pain: Secondary | ICD-10-CM | POA: Insufficient documentation

## 2014-12-05 DIAGNOSIS — J029 Acute pharyngitis, unspecified: Secondary | ICD-10-CM

## 2014-12-05 DIAGNOSIS — Z791 Long term (current) use of non-steroidal anti-inflammatories (NSAID): Secondary | ICD-10-CM | POA: Diagnosis not present

## 2014-12-05 DIAGNOSIS — R5081 Fever presenting with conditions classified elsewhere: Secondary | ICD-10-CM | POA: Insufficient documentation

## 2014-12-05 DIAGNOSIS — M791 Myalgia, unspecified site: Secondary | ICD-10-CM

## 2014-12-05 LAB — COMPREHENSIVE METABOLIC PANEL
ALBUMIN: 4.2 g/dL (ref 3.5–5.0)
ALT: 25 U/L (ref 14–54)
ANION GAP: 9 (ref 5–15)
AST: 27 U/L (ref 15–41)
Alkaline Phosphatase: 86 U/L (ref 38–126)
BUN: 10 mg/dL (ref 6–20)
CALCIUM: 9.4 mg/dL (ref 8.9–10.3)
CO2: 24 mmol/L (ref 22–32)
Chloride: 104 mmol/L (ref 101–111)
Creatinine, Ser: 0.75 mg/dL (ref 0.44–1.00)
GFR calc Af Amer: >60 mL/min (ref >60)
GFR calc non Af Amer: >60 mL/min (ref >60)
GLUCOSE: 155 mg/dL — AB (ref 65–99)
Potassium: 4.2 mmol/L (ref 3.5–5.1)
SODIUM: 137 mmol/L (ref 135–145)
TOTAL PROTEIN: 8.4 g/dL — AB (ref 6.5–8.1)
Total Bilirubin: 0.4 mg/dL (ref 0.3–1.2)

## 2014-12-05 LAB — URINALYSIS COMPLETE WITH MICROSCOPIC (ARMC ONLY)
BILIRUBIN URINE: NEGATIVE
Bacteria, UA: NONE SEEN
Glucose, UA: NEGATIVE mg/dL
HGB URINE DIPSTICK: NEGATIVE
Ketones, ur: NEGATIVE mg/dL
Leukocytes, UA: NEGATIVE
Nitrite: NEGATIVE
Protein, ur: 100 mg/dL — AB
SPECIFIC GRAVITY, URINE: 1.026 (ref 1.005–1.030)
pH: 5 (ref 5.0–8.0)

## 2014-12-05 LAB — CBC
HEMATOCRIT: 35.1 % (ref 35.0–47.0)
Hemoglobin: 11.1 g/dL — ABNORMAL LOW (ref 12.0–16.0)
MCH: 23.1 pg — AB (ref 26.0–34.0)
MCHC: 31.7 g/dL — AB (ref 32.0–36.0)
MCV: 72.9 fL — AB (ref 80.0–100.0)
Platelets: 283 10*3/uL (ref 150–440)
RBC: 4.81 MIL/uL (ref 3.80–5.20)
RDW: 18.8 % — AB (ref 11.5–14.5)
WBC: 16.1 10*3/uL — ABNORMAL HIGH (ref 3.6–11.0)

## 2014-12-05 LAB — TROPONIN I: Troponin I: <0.03 ng/mL (ref <0.031)

## 2014-12-05 MED ORDER — KETOROLAC TROMETHAMINE 30 MG/ML IJ SOLN
INTRAMUSCULAR | Status: AC
  Filled 2014-12-05: qty 1

## 2014-12-05 MED ORDER — ACETAMINOPHEN 500 MG PO TABS
1000.0000 mg | ORAL_TABLET | Freq: Once | ORAL | Status: AC
  Administered 2014-12-05: 1000 mg via ORAL

## 2014-12-05 MED ORDER — KETOROLAC TROMETHAMINE 30 MG/ML IJ SOLN
30.0000 mg | Freq: Once | INTRAMUSCULAR | Status: AC
  Administered 2014-12-05: 30 mg via INTRAVENOUS

## 2014-12-05 MED ORDER — SODIUM CHLORIDE 0.9 % IV SOLN
1000.0000 mL | Freq: Once | INTRAVENOUS | Status: AC
  Administered 2014-12-05: 1000 mL via INTRAVENOUS

## 2014-12-05 MED ORDER — SODIUM CHLORIDE 0.9 % IV BOLUS (SEPSIS): 1000.0000 mL | Freq: Once | INTRAVENOUS | Status: DC

## 2014-12-05 MED ORDER — ACETAMINOPHEN 500 MG PO TABS
ORAL_TABLET | ORAL | Status: AC
  Filled 2014-12-05: qty 2

## 2014-12-05 NOTE — ED Provider Notes (Addendum)
[facility] Emergency Department Provider Note  ____________________________________________  Time seen: Approximately 11:30 PM  I have reviewed the triage vital signs and the nursing notes.   HISTORY  Chief Complaint Generalized Body Aches; Sore Throat; and Chest Pain    HPI Penny Gonzalez is a 19 y.o. female history of diabetes presents for evaluation of fever, myalgias, sore throat gradual onset, constant since this morning. Her young infant daughter has recently been ill with upper respiratory tract infection symptoms. The patient is having pain in her chest, her back, her arms and her legs. No dysuria, no vomiting or diarrhea. Current severity is moderate. No modifying factors.   Past Medical History  Diagnosis Date  . Diabetes mellitus without complication   . Irregular periods/menstrual cycles   . Joint pain of leg   . Depression     patient has not been officially diagnosed but has symptoms  . Anemia, iron deficiency, inadequate dietary intake 12/01/2014    Patient Active Problem List   Diagnosis Date Noted  . Anemia, iron deficiency, inadequate dietary intake 12/01/2014  . Diabetes mellitus type 2, controlled 11/26/2014  . Headache disorder 11/26/2014  . Arthralgia of multiple joints 11/26/2014  . Obesity, Class II, BMI 35-39.9 11/26/2014  . Contact with and suspected exposure to infections with predominantly sexual mode of transmission 11/26/2014  . Missed period 11/26/2014  . Diabetes in pregnancy 06/24/2014    History reviewed. No pertinent past surgical history.  Current Outpatient Rx  Name  Route  Sig  Dispense  Refill  . ferrous sulfate 325 (65 FE) MG tablet   Oral   Take 1 tablet (325 mg total) by mouth daily with breakfast.   90 tablet   2   . metFORMIN (GLUCOPHAGE) 500 MG tablet   Oral   Take 1 tablet (500 mg total) by mouth daily.   90 tablet   2   . norethindrone-ethinyl estradiol (MICROGESTIN,JUNEL,LOESTRIN) 1-20  MG-MCG tablet   Oral   Take 1 tablet by mouth daily.         . naproxen (NAPROSYN) 500 MG tablet   Oral   Take 1 tablet (500 mg total) by mouth 2 (two) times daily with a meal. Patient not taking: Reported on 11/26/2014   60 tablet   2     Allergies Review of patient's allergies indicates no known allergies.  Family History  Problem Relation Age of Onset  . Diabetes Mother   . Arthritis Father   . Diabetes Father   . Heart disease Father   . Hypertension Father   . Stroke Father   . Hearing loss Sister   . Hypertension Brother   . Hypertension Paternal Grandfather     Social History History  Substance Use Topics  . Smoking status: Never Smoker   . Smokeless tobacco: Not on file  . Alcohol Use: No    Review of Systems Constitutional: + fever/chills Eyes: No visual changes. ENT: No sore throat. Cardiovascular: + chest pain. Respiratory: Denies shortness of breath. Gastrointestinal: No abdominal pain.  No nausea, no vomiting.  No diarrhea.  No constipation. Genitourinary: Negative for dysuria. Musculoskeletal: Negative for back pain. Skin: Negative for rash. Neurological: Negative for headaches, focal weakness or numbness.  10-point ROS otherwise negative.  ____________________________________________   PHYSICAL EXAM:  VITAL SIGNS: ED Triage Vitals  Enc Vitals Group     BP 12/05/14 1925 130/92 mmHg     Pulse Rate 12/05/14 1925 134     Resp 12/05/14  1925 22     Temp 12/05/14 1925 98.5 F (36.9 C)     Temp Source 12/05/14 1925 Oral     SpO2 12/05/14 1919 98 %     Weight 12/05/14 1925 235 lb (106.595 kg)     Height 12/05/14 1925 5\' 9"  (1.753 m)     Head Cir --      Peak Flow --      Pain Score 12/05/14 1928 8     Pain Loc --      Pain Edu? --      Excl. in GC? --     Constitutional: Alert and oriented. Well appearing and in no acute distress. Eyes: Conjunctivae are normal. PERRL. EOMI. Head: Atraumatic. Nose: No  congestion/rhinnorhea. Mouth/Throat: Mucous membranes are moist.  Oropharynx without erythema or exudate. No deviation of uvula or hoarse voice. Neck: No stridor.  Supple without meningismus. Cardiovascular: Tachycardic rate, regular rhythm. Grossly normal heart sounds.  Good peripheral circulation. Respiratory: Normal respiratory effort.  No retractions. Lungs CTAB. Gastrointestinal: Soft and nontender. No distention. No abdominal bruits. No CVA tenderness. Genitourinary: deferred Musculoskeletal: No lower extremity tenderness nor edema.  No joint effusions. Neurologic:  Normal speech and language. No gross focal neurologic deficits are appreciated. Speech is normal. No gait instability. Skin:  Skin is warm, dry and intact. No rash noted. Psychiatric: Mood and affect are normal. Speech and behavior are normal.  ____________________________________________   LABS (all labs ordered are listed, but only abnormal results are displayed)  Labs Reviewed  CBC - Abnormal; Notable for the following:    WBC 16.1 (*)    Hemoglobin 11.1 (*)    MCV 72.9 (*)    MCH 23.1 (*)    MCHC 31.7 (*)    RDW 18.8 (*)    All other components within normal limits  COMPREHENSIVE METABOLIC PANEL - Abnormal; Notable for the following:    Glucose, Bld 155 (*)    Total Protein 8.4 (*)    All other components within normal limits  URINALYSIS COMPLETEWITH MICROSCOPIC ([facility] ONLY) - Abnormal; Notable for the following:    Color, Urine YELLOW (*)    APPearance CLEAR (*)    Protein, ur 100 (*)    Squamous Epithelial / LPF 0-5 (*)    All other components within normal limits  TROPONIN I   ____________________________________________  EKG  ED ECG REPORT I, Gayla Doss, the attending physician, personally viewed and interpreted this ECG.   Date: 12/05/2014  EKG Time: 19:26  Rate: 125  Rhythm: sinus tachycardia  Axis: normal  Intervals:none  ST&T Change: No acute ST segment elevation, nonspecific T-wave  flattening in lateral leads  ____________________________________________  RADIOLOGY  CXR FINDINGS: The heart size and mediastinal contours are within normal limits. Both lungs are clear. The visualized skeletal structures are unremarkable.  IMPRESSION: No active cardiopulmonary disease. ____________________________________________   PROCEDURES  Procedure(s) performed: None  Critical Care performed: Yes, see critical care note(s). Total critical care time spent 35 minutes given abnormal vital signs, multiple fluid boluses administered, fever.  ____________________________________________   INITIAL IMPRESSION / ASSESSMENT AND PLAN / ED COURSE  Pertinent labs & imaging results that were available during my care of the patient were reviewed by me and considered in my medical decision making (see chart for details).  Penny Gonzalez is a 19 y.o. female history of diabetes presents for evaluation of fever, myalgias, sore throat gradual onset, constant since this morning. On exam, she is very well-appearing in no acute  distress. She is tachycardic with fever, temperature 100.7. neck supple without meningismus. Lungs clear to auscultation bilaterally. Abdomen soft, nontender nondistended. Suspect viral syndrome/likely viral pharyngitis. Strep test is pending. She does not appear toxic, appears well hydrated, is tolerating oral intake very well. Antipyretics ordered (toradol ordered by another doctor in triage), her myalgias have improved, IV fluids infusing. Plan to discharge home with PCP follow-up, symptomatic care with return precautions once her heart rate and temperature improve. Dr. for back to follow-up heart rate and point of care strep test. Care transferred to Dr. York Cerise at 11:40 pm ____________________________________________   FINAL CLINICAL IMPRESSION(S) / ED DIAGNOSES  Final diagnoses:  Pharyngitis  Other specified fever  Myalgia      Gayla Doss,  MD 12/05/14 1610  Gayla Doss, MD 12/05/14 2343

## 2014-12-05 NOTE — ED Notes (Signed)
Pt came up to RN station at this time demanding iv be taking out because pt wants to leave. Pt states everyone has been rude here and would like to speak to someone in charge, charge RN andrea made aware, currently speaking with pt at this time along with MD williams.

## 2014-12-05 NOTE — ED Notes (Addendum)
After speaking to charge RN andrea and MD williams pt agreed to stay. MD gayle orders to restart IV and iv fluids at this time in order to bring downb pts' HR. Pt made aware and verbalized understanding and agreed to this at this time.

## 2014-12-05 NOTE — ED Provider Notes (Signed)
MSE was initiated and I personally evaluated the patient and placed orders (if any) at  9:09 PM on December 05, 2014.  The patient appears stable so that the remainder of the MSE may be completed by another provider.  Emily Filbert, MD 12/05/14 2109

## 2014-12-05 NOTE — ED Notes (Signed)
Pt arrived to the ED for complaints of generalized body aches, chest pain and sore throat. Pt states that she began to feel bad this morning and progressively gotten worse. Pt states that the chest pain is centralized experiencing shortness of breath, nausea and radiating back pain. Pt has no previous cardiac history with family cardiac history. Pt is AOx4 in moderate distress in triage, MD aware.

## 2014-12-06 LAB — POCT RAPID STREP A: Streptococcus, Group A Screen (Direct): NEGATIVE

## 2014-12-06 NOTE — ED Provider Notes (Signed)
-----------------------------------------   11:35 PM on 12/05/2014 -----------------------------------------  Assuming care from Dr. Inocencio Homes.  In short, Penny Gonzalez is a 19 y.o. female with a chief complaint of Generalized Body Aches; Sore Throat; and Chest Pain .  Refer to the original H&P for additional details.  The current plan of care is to give her IV fluids and reassess her tachycardia.  ----------------------------------------- 1:35 AM on 12/06/2014 -----------------------------------------  The patient has been sleeping comfortably.  Her heart rate remained slightly elevated at about 105-110, but she is in no acute distress.  I reassessed her and she is breathing and speaking comfortably.  She asked for and tolerated ginger ale in the emergency department.  She states that she is ready to go home.  I will discharge her per Dr. Joya Salm instructions.  I am not concerned about a serious bacterial infection or an emergent medical condition at this time and agree with Dr. Joya Salm initial assessment.   Loleta Rose, MD 12/06/14 [phone]

## 2014-12-06 NOTE — ED Notes (Signed)
POCT Rapid Strep A resulted= NEGATIVE

## 2014-12-10 ENCOUNTER — Encounter: Payer: Self-pay | Admitting: Family Medicine

## 2014-12-10 ENCOUNTER — Ambulatory Visit (INDEPENDENT_AMBULATORY_CARE_PROVIDER_SITE_OTHER): Payer: Medicaid Other | Admitting: Family Medicine

## 2014-12-10 VITALS — BP 120/88 | HR 96 | Temp 97.9°F | Resp 20 | Ht 68.2 in | Wt 234.9 lb

## 2014-12-10 DIAGNOSIS — R000 Tachycardia, unspecified: Secondary | ICD-10-CM

## 2014-12-10 DIAGNOSIS — I471 Supraventricular tachycardia: Secondary | ICD-10-CM | POA: Diagnosis not present

## 2014-12-10 NOTE — Progress Notes (Signed)
Name: Penny Gonzalez   MRN: [id number]    DOB: July 12, 1995   Date:12/10/2014       Progress Note  Subjective  Chief Complaint  Chief Complaint  Patient presents with  . Follow-up    ER follow-up from 12/05/14    HPI  Patient is here for follow up after being seen in the ER: She attended a cook out, started feeling light headed so was taken to ER. Found to have dehydration, given IV fluids. CXR, EKG, lab work otherwise unremarkable. She did not eat anything at the cook out but did note that she did not feel well earlier in the morning to begin with. She also feels that she had some anxiety involved with the situation. Since her episode she denies chest pain, palpitations, nausea, vomiting, paresthesias, dizziness, shortness of breath.  Past Medical History  Diagnosis Date  . Diabetes mellitus without complication   . Irregular periods/menstrual cycles   . Joint pain of leg   . Depression     patient has not been officially diagnosed but has symptoms  . Anemia, iron deficiency, inadequate dietary intake 12/01/2014    History reviewed. No pertinent past surgical history.  Family History  Problem Relation Age of Onset  . Diabetes Mother   . Arthritis Father   . Diabetes Father   . Heart disease Father   . Hypertension Father   . Stroke Father   . Hearing loss Sister   . Hypertension Brother   . Hypertension Paternal Grandfather     History   Social History  . Marital Status: Single    Spouse Name: N/A  . Number of Children: 1  . Years of Education: N/A   Occupational History  . Not on file.   Social History Main Topics  . Smoking status: Never Smoker   . Smokeless tobacco: Not on file  . Alcohol Use: No  . Drug Use: No  . Sexual Activity:    Partners: Male    Birth Control/ Protection: Condom     Comment: patient also uses birth control pills   Other Topics Concern  . Not on file   Social History Narrative     Current outpatient prescriptions:  .   ferrous sulfate 325 (65 FE) MG tablet, Take 1 tablet (325 mg total) by mouth daily with breakfast., Disp: 90 tablet, Rfl: 2 .  metFORMIN (GLUCOPHAGE) 500 MG tablet, Take 1 tablet (500 mg total) by mouth daily., Disp: 90 tablet, Rfl: 2 .  naproxen (NAPROSYN) 500 MG tablet, Take 1 tablet (500 mg total) by mouth 2 (two) times daily with a meal., Disp: 60 tablet, Rfl: 2 .  norethindrone-ethinyl estradiol (MICROGESTIN,JUNEL,LOESTRIN) 1-20 MG-MCG tablet, Take 1 tablet by mouth daily., Disp: , Rfl:   No Known Allergies   ROS  CONSTITUTIONAL: No significant weight changes, fever, chills, weakness or fatigue.  HEENT:  - Eyes: No visual changes.  - Ears: No auditory changes. No pain.  - Nose: No sneezing, congestion, runny nose. - Throat: No sore throat. No changes in swallowing. SKIN: No rash or itching.  CARDIOVASCULAR: No chest pain, chest pressure or chest discomfort. No palpitations or edema.  RESPIRATORY: No shortness of breath, cough or sputum.  GASTROINTESTINAL: No anorexia, nausea, vomiting. No changes in bowel habits. No abdominal pain or blood.  GENITOURINARY: No dysuria. No frequency. No discharge.  NEUROLOGICAL: No headache, dizziness, syncope, paralysis, ataxia, numbness or tingling in the extremities. No memory changes. No change in bowel or bladder control.  MUSCULOSKELETAL: No joint pain. No muscle pain. HEMATOLOGIC: No anemia, bleeding or bruising.  LYMPHATICS: No enlarged lymph nodes.  PSYCHIATRIC: No change in mood. No change in sleep pattern.  ENDOCRINOLOGIC: No reports of sweating, cold or heat intolerance. No polyuria or polydipsia.   Objective  Filed Vitals:   12/10/14 1009  BP: 120/88  Pulse: 96  Temp: 97.9 F (36.6 C)  TempSrc: Oral  Resp: 20  Height: 5' 8.2" (1.732 m)  Weight: 234 lb 14.4 oz (106.55 kg)  SpO2: 97%   Body mass index is 35.52 kg/(m^2).   Recent Results (from the past 2160 hour(s))  Troponin I     Status: None   Collection Time: 09/20/14   3:14 AM  Result Value Ref Range   Troponin-I <0.03 ng/mL    Comment: 0.00-0.03 0.03 ng/mL or less: NEGATIVE  Repeat testing in 3-6 hrs  if clinically indicated. >0.05 ng/mL: POTENTIAL  MYOCARDIAL INJURY. Repeat  testing in 3-6 hrs if  clinically indicated. NOTE: An increase or decrease  of 30% or more on serial  testing suggests a  clinically important change NOTE: New Reference Range  08/19/14   CBC     Status: Abnormal   Collection Time: 09/20/14  3:14 AM  Result Value Ref Range   WBC 5.9 3.6-11.0 x10 3/mm 3   RBC 4.12 3.80-5.20 X10 6/mm 3   HGB 9.9 (L) 12.0-16.0 g/dL   HCT 31.7 (L) 35.0-47.0 %   MCV 77 (L) 80-100 fL   MCH 24.0 (L) 26.0-34.0 pg   MCHC 31.1 (L) 32.0-36.0 g/dL   RDW 17.6 (H) 11.5-14.5 %   Platelet 283 150-440 x10 3/mm 3  Basic metabolic panel     Status: Abnormal   Collection Time: 09/20/14  3:14 AM  Result Value Ref Range   Glucose, CSF DNP mg/dL    Comment: 65-99 NOTE: New Reference Range  08/19/14    BUN 9 mg/dL    Comment: 6-20 NOTE: New Reference Range  08/19/14    Creatinine 0.75 mg/dL    Comment: 0.44-1.00 NOTE: New Reference Range  08/19/14    Sodium, Urine Random DNP mmol/L    Comment: 135-145 NOTE: New Reference Range  08/19/14    Potassium, Urine Random DNP mmol/L    Comment: 3.5-5.1 NOTE: New Reference Range  08/19/14    Chloride, Urine Random DNP mmol/L    Comment: 101-111 NOTE: New Reference Range  08/19/14    Co2 26 mmol/L    Comment: 22-32 NOTE: New Reference Range  08/19/14    Calcium, Total 8.6 (L) mg/dL    Comment: 8.9-10.3 NOTE: New Reference Range  08/19/14    Anion Gap 7 7-16   EGFR (African American) >60    EGFR (Non-African Amer.) >60     Comment: eGFR values <58mL/min/1.73 m2 may be an indication of chronic kidney disease (CKD). Calculated eGFR is useful in patients with stable renal function. The eGFR calculation will not be reliable in acutely ill patients when serum creatinine is changing  rapidly. It is not useful in patients on dialysis. The eGFR calculation may not be applicable to patients at the low and high extremes of body sizes, pregnant women, and vegetarians.    Glucose 144 (H) mg/dL    Comment: 65-99 NOTE: New Reference Range  08/19/14    Sodium 139 mmol/L    Comment: 135-145 NOTE: New Reference Range  08/19/14    Potassium 3.8 mmol/L    Comment: 3.5-5.1 NOTE: New Reference Range  08/19/14  Chloride 106 mmol/L    Comment: 101-111 NOTE: New Reference Range  08/19/14   CBC     Status: Abnormal   Collection Time: 09/21/14  9:49 AM  Result Value Ref Range   WBC 4.6 3.6-11.0 x10 3/mm 3   RBC 4.38 3.80-5.20 X10 6/mm 3   HGB 10.6 (L) 12.0-16.0 g/dL   HCT 19.8 (L) 24.2-99.8 %   MCV 78 (L) 80-100 fL   MCH 24.2 (L) 26.0-34.0 pg   MCHC 31.2 (L) 32.0-36.0 g/dL   RDW 06.9 (H) 99.6-72.2 %   Platelet 302 150-440 x10 3/mm 3  POCT urine pregnancy     Status: Normal   Collection Time: 11/26/14  8:43 AM  Result Value Ref Range   Preg Test, Ur Negative Negative  CMP14+EGFR     Status: Abnormal   Collection Time: 11/27/14  7:11 AM  Result Value Ref Range   Glucose 124 (H) 65 - 99 mg/dL   BUN 7 6 - 20 mg/dL   Creatinine, Ser 7.73 0.57 - 1.00 mg/dL   GFR calc non Af Amer 125 >59 mL/min/1.73   GFR calc Af Amer 144 >59 mL/min/1.73   BUN/Creatinine Ratio 10 8 - 20   Sodium 138 134 - 144 mmol/L   Potassium 4.1 3.5 - 5.2 mmol/L   Chloride 100 97 - 108 mmol/L   CO2 22 18 - 29 mmol/L   Calcium 9.2 8.7 - 10.2 mg/dL   Total Protein 7.0 6.0 - 8.5 g/dL   Albumin 4.4 3.5 - 5.5 g/dL   Globulin, Total 2.6 1.5 - 4.5 g/dL   Albumin/Globulin Ratio 1.7 1.1 - 2.5   Bilirubin Total <0.2 0.0 - 1.2 mg/dL   Alkaline Phosphatase 89 39 - 117 IU/L   AST 16 0 - 40 IU/L   ALT 13 0 - 32 IU/L  CBC with Differential/Platelet     Status: Abnormal   Collection Time: 11/27/14  7:17 AM  Result Value Ref Range   WBC 5.0 3.4 - 10.8 x10E3/uL   RBC 4.62 3.77 - 5.28 x10E6/uL    Hemoglobin 10.6 (L) 11.1 - 15.9 g/dL   Hematocrit 75.0 (L) 51.0 - 46.6 %   MCV 72 (L) 79 - 97 fL   MCH 22.9 (L) 26.6 - 33.0 pg   MCHC 31.9 31.5 - 35.7 g/dL   RDW 71.2 (H) 52.4 - 79.9 %   Platelets 300 150 - 379 x10E3/uL   NEUTROPHILS 46 %   Lymphs 46 %   Monocytes 6 %   Eos 2 %   Basos 0 %   Neutrophils Absolute 2.3 1.4 - 7.0 x10E3/uL   Lymphocytes Absolute 2.4 0.7 - 3.1 x10E3/uL   Monocytes Absolute 0.3 0.1 - 0.9 x10E3/uL   EOS (ABSOLUTE) 0.1 0.0 - 0.4 x10E3/uL   Basophils Absolute 0.0 0.0 - 0.2 x10E3/uL   Immature Granulocytes 0 %   Immature Grans (Abs) 0.0 0.0 - 0.1 x10E3/uL  Lipid panel     Status: None   Collection Time: 11/27/14  7:17 AM  Result Value Ref Range   Cholesterol, Total 152 100 - 199 mg/dL   Triglycerides 800 0 - 149 mg/dL   HDL 60 >12 mg/dL    Comment: According to ATP-III Guidelines, HDL-C >59 mg/dL is considered a negative risk factor for CHD.    VLDL Cholesterol Cal 23 5 - 40 mg/dL   LDL Calculated 69 0 - 99 mg/dL   Chol/HDL Ratio 2.5 0.0 - 4.4 ratio units    Comment:  T. Chol/HDL Ratio                                             Men  Women                               1/2 Avg.Risk  3.4    3.3                                   Avg.Risk  5.0    4.4                                2X Avg.Risk  9.6    7.1                                3X Avg.Risk 23.4   11.0   TSH     Status: None   Collection Time: 11/27/14  7:17 AM  Result Value Ref Range   TSH 1.120 0.450 - 4.500 uIU/mL  Sedimentation rate     Status: None   Collection Time: 11/27/14  7:17 AM  Result Value Ref Range   Sed Rate 16 0 - 32 mm/hr  Rheumatoid factor     Status: None   Collection Time: 11/27/14  7:17 AM  Result Value Ref Range   Rhuematoid fact SerPl-aCnc 11.3 0.0 - 13.9 IU/mL  HgB A1c     Status: Abnormal   Collection Time: 11/27/14  7:17 AM  Result Value Ref Range   Hgb A1c MFr Bld 6.7 (H) 4.8 - 5.6 %    Comment:          Pre-diabetes: 5.7 -  6.4          Diabetes: >6.4          Glycemic control for adults with diabetes: <7.0    Est. average glucose Bld gHb Est-mCnc 146 mg/dL  Antinuclear Antib (ANA)     Status: None   Collection Time: 11/27/14  7:17 AM  Result Value Ref Range   Anit Nuclear Antibody(ANA) Negative Negative  Prolactin     Status: Abnormal   Collection Time: 11/27/14  7:17 AM  Result Value Ref Range   Prolactin 40.9 (H) 4.8 - 23.3 ng/mL  DHEA-sulfate     Status: None   Collection Time: 11/27/14  7:17 AM  Result Value Ref Range   DHEA-SO4 144.7 110.0 - 431.7 ug/dL  Beta HCG, Quant (tumor marker)     Status: None   Collection Time: 11/27/14  7:17 AM  Result Value Ref Range   hCG Quant <1 mIU/mL    Comment:                      Female (Non-pregnant)    0 -     5                             (Postmenopausal)  0 -     8  Female (Pregnant)                      Weeks of Gestation                              3                6 -    71                              4               10 -   750                              5              217 -  7138                              6              158 - 29562                              7             [id number]                              8            [id number]                              9            [id number]                             12            [facility]  [place] CLIA methodology   Specimen status report     Status: None   Collection Time: 11/27/14  7:17 AM  Result Value Ref Range   specimen status report Comment     Comment: Tracking [facility] Error Written Authorization Written  Authorization Written Authorization Received. Authorization received from Original Request 12-01-2014 Logged by [facility] [address] [facility]   CBC     Status: Abnormal   Collection Time: 12/05/14  7:45 PM  Result Value Ref Range   WBC 16.1 (H) 3.6 - 11.0 K/uL   RBC 4.81 3.80 - 5.20 MIL/uL   Hemoglobin 11.1 (L) 12.0 - 16.0 g/dL   HCT 35.1 35.0 - 47.0 %   MCV 72.9 (L) 80.0 - 100.0 fL   MCH 23.1 (L) 26.0 - 34.0 pg   MCHC 31.7 (L) 32.0 - 36.0 g/dL   RDW 18.8 (H) 11.5 - 14.5 %   Platelets 283 150 - 440 K/uL  Comprehensive metabolic panel     Status: Abnormal   Collection Time: 12/05/14  7:45 PM  Result Value Ref Range   Sodium 137 135 - 145 mmol/L   Potassium 4.2 3.5 - 5.1 mmol/L   Chloride 104 101 - 111 mmol/L   CO2 24 22 - 32 mmol/L   Glucose, Bld 155 (H) 65 - 99 mg/dL   BUN 10 6 - 20 mg/dL   Creatinine, Ser 0.75 0.44 - 1.00 mg/dL   Calcium 9.4 8.9 - 10.3 mg/dL   Total Protein 8.4 (H) 6.5 - 8.1 g/dL   Albumin 4.2 3.5 - 5.0 g/dL   AST 27 15 - 41 U/L   ALT 25 14 - 54 U/L   Alkaline Phosphatase 86 38 - 126 U/L   Total Bilirubin 0.4 0.3 - 1.2 mg/dL   GFR calc non Af Amer >60 >60 mL/min   GFR calc Af Amer >60 >60 mL/min    Comment: (NOTE) The eGFR has been calculated using the CKD EPI equation. This calculation has not been validated in all clinical situations. eGFR's persistently <60 mL/min signify possible Chronic Kidney Disease.    Anion gap 9 5 - 15  Troponin I     Status: None   Collection Time: 12/05/14  7:45 PM  Result Value Ref Range   Troponin I <0.03 <0.031 ng/mL    Comment:        NO INDICATION OF MYOCARDIAL INJURY.   Urinalysis complete, with microscopic ([facility] only)     Status: Abnormal   Collection Time: 12/05/14  9:14 PM  Result Value Ref Range   Color, Urine YELLOW (A) YELLOW   APPearance CLEAR (A) CLEAR   Glucose, UA NEGATIVE NEGATIVE mg/dL   Bilirubin Urine NEGATIVE NEGATIVE   Ketones, ur NEGATIVE NEGATIVE mg/dL   Specific Gravity, Urine 1.026 1.005 - 1.030   Hgb  urine dipstick NEGATIVE NEGATIVE   pH 5.0 5.0 - 8.0   Protein, ur 100 (A) NEGATIVE mg/dL   Nitrite NEGATIVE NEGATIVE   Leukocytes, UA NEGATIVE NEGATIVE   RBC / HPF 0-5 0 - 5 RBC/hpf   WBC, UA 0-5 0 - 5 WBC/hpf   Bacteria, UA NONE SEEN NONE SEEN   Squamous Epithelial / LPF 0-5 (A) NONE SEEN   Mucous PRESENT  POCT rapid strep A [facility])     Status: None   Collection Time: 12/06/14 12:12 AM  Result Value Ref Range   Streptococcus, Group A Screen (Direct) NEGATIVE NEGATIVE    Physical Exam  Constitutional: Patient is obese and well-nourished. In no distress.  HEENT:  - Head: Normocephalic and atraumatic.  - Ears: Bilateral TMs gray, no erythema or effusion - Nose: Nasal mucosa moist - Mouth/Throat: Oropharynx is clear and moist. No tonsillar hypertrophy or erythema. No post nasal drainage.  - Eyes: Conjunctivae clear, EOM movements normal. PERRLA. No scleral icterus.  Neck: Normal range of motion. Neck supple. No JVD present. No thyromegaly present.  Cardiovascular: Normal rate, regular rhythm and normal heart sounds. No murmur heard.  Pulmonary/Chest: Effort normal and breath sounds normal. No respiratory distress. Musculoskeletal: Normal range of motion bilateral UE and LE, no joint effusions. Peripheral vascular: Bilateral LE no edema. Neurological: CN II-XII grossly intact with no focal deficits. Alert and oriented to person, place, and time. Coordination, balance, strength, speech and gait are normal.  Skin: Skin is warm and dry. No rash noted. No erythema.  Psychiatric: Patient has a stable mood and affect. Behavior is normal in office today. Judgment and thought content normal in office today.   Assessment & Plan  1. Sinus tachycardia Symptoms resolved. Reassurance provided. Encouraged patient to keep well hydrated and be mindful of anxiety heightening situations.

## 2014-12-25 ENCOUNTER — Encounter: Payer: Self-pay | Admitting: Family Medicine

## 2015-02-16 DIAGNOSIS — E119 Type 2 diabetes mellitus without complications: Secondary | ICD-10-CM | POA: Insufficient documentation

## 2015-02-16 DIAGNOSIS — R110 Nausea: Secondary | ICD-10-CM | POA: Diagnosis not present

## 2015-02-16 DIAGNOSIS — R109 Unspecified abdominal pain: Secondary | ICD-10-CM | POA: Diagnosis present

## 2015-02-16 LAB — CBC WITH DIFFERENTIAL/PLATELET
BASOS PCT: 0 %
Basophils Absolute: 0 10*3/uL (ref 0–0.1)
EOS ABS: 0.1 10*3/uL (ref 0–0.7)
Eosinophils Relative: 2 %
HCT: 37.6 % (ref 35.0–47.0)
HEMOGLOBIN: 12 g/dL (ref 12.0–16.0)
Lymphocytes Relative: 39 %
Lymphs Abs: 2.6 10*3/uL (ref 1.0–3.6)
MCH: 24.7 pg — ABNORMAL LOW (ref 26.0–34.0)
MCHC: 32 g/dL (ref 32.0–36.0)
MCV: 77.1 fL — ABNORMAL LOW (ref 80.0–100.0)
Monocytes Absolute: 0.3 10*3/uL (ref 0.2–0.9)
Monocytes Relative: 5 %
NEUTROS PCT: 54 %
Neutro Abs: 3.7 10*3/uL (ref 1.4–6.5)
Platelets: 300 10*3/uL (ref 150–440)
RBC: 4.87 MIL/uL (ref 3.80–5.20)
RDW: 19.2 % — ABNORMAL HIGH (ref 11.5–14.5)
WBC: 6.8 10*3/uL (ref 3.6–11.0)

## 2015-02-16 LAB — COMPREHENSIVE METABOLIC PANEL
ALK PHOS: 66 U/L (ref 38–126)
ALT: 19 U/L (ref 14–54)
AST: 26 U/L (ref 15–41)
Albumin: 4.3 g/dL (ref 3.5–5.0)
Anion gap: 7 (ref 5–15)
BUN: 9 mg/dL (ref 6–20)
CALCIUM: 9.9 mg/dL (ref 8.9–10.3)
CO2: 28 mmol/L (ref 22–32)
CREATININE: 0.68 mg/dL (ref 0.44–1.00)
Chloride: 104 mmol/L (ref 101–111)
GFR calc Af Amer: >60 mL/min (ref >60)
GFR calc non Af Amer: >60 mL/min (ref >60)
Glucose, Bld: 137 mg/dL — ABNORMAL HIGH (ref 65–99)
Potassium: 3.9 mmol/L (ref 3.5–5.1)
SODIUM: 139 mmol/L (ref 135–145)
Total Bilirubin: 0.4 mg/dL (ref 0.3–1.2)
Total Protein: 7.9 g/dL (ref 6.5–8.1)

## 2015-02-16 LAB — LIPASE, BLOOD: Lipase: 24 U/L (ref 22–51)

## 2015-02-16 NOTE — ED Notes (Addendum)
Pt reports hx of gallstones when she was 6 mos pregnant, pt states that she has had pain intermittently since, but for the past couple days the pain has been worse, radiating through to her back, nausea, and pain with her bm's. Pt states she is here with another pt being seen but states that she is the driver, not the other pt.

## 2015-02-17 ENCOUNTER — Emergency Department
Admission: EM | Admit: 2015-02-17 | Discharge: 2015-02-17 | Discharge status: Against Medical Advice | Payer: Medicaid Other | Attending: Emergency Medicine | Admitting: Emergency Medicine

## 2015-02-18 ENCOUNTER — Encounter: Payer: Self-pay | Admitting: Family Medicine

## 2015-02-18 ENCOUNTER — Ambulatory Visit (INDEPENDENT_AMBULATORY_CARE_PROVIDER_SITE_OTHER): Payer: Medicaid Other | Admitting: Family Medicine

## 2015-02-18 VITALS — BP 118/72 | HR 107 | Temp 98.2°F | Resp 18 | Wt 231.7 lb

## 2015-02-18 DIAGNOSIS — E119 Type 2 diabetes mellitus without complications: Secondary | ICD-10-CM | POA: Diagnosis not present

## 2015-02-18 DIAGNOSIS — K5901 Slow transit constipation: Secondary | ICD-10-CM

## 2015-02-18 DIAGNOSIS — R1010 Upper abdominal pain, unspecified: Secondary | ICD-10-CM

## 2015-02-18 DIAGNOSIS — G8929 Other chronic pain: Secondary | ICD-10-CM

## 2015-02-18 DIAGNOSIS — J358 Other chronic diseases of tonsils and adenoids: Secondary | ICD-10-CM | POA: Diagnosis not present

## 2015-02-18 DIAGNOSIS — R1011 Right upper quadrant pain: Secondary | ICD-10-CM

## 2015-02-18 MED ORDER — DOCUSATE SODIUM 100 MG PO CAPS: 100.0000 mg | ORAL_CAPSULE | Freq: Two times a day (BID) | ORAL | Status: DC

## 2015-02-18 MED ORDER — METFORMIN HCL 500 MG PO TABS
500.0000 mg | ORAL_TABLET | Freq: Every day | ORAL | Status: DC
Start: 2015-02-18 — End: 2015-10-27

## 2015-02-18 NOTE — Patient Instructions (Signed)
Cholelithiasis °Cholelithiasis (also called gallstones) is a form of gallbladder disease in which gallstones form in your gallbladder. The gallbladder is an organ that stores bile made in the liver, which helps digest fats. Gallstones begin as small crystals and slowly grow into stones. Gallstone pain occurs when the gallbladder spasms and a gallstone is blocking the duct. Pain can also occur when a stone passes out of the duct.  °RISK FACTORS °· Being female.   °· Having multiple pregnancies. Health care providers sometimes advise removing diseased gallbladders before future pregnancies.   °· Being obese. °· Eating a diet heavy in fried foods and fat.   °· Being older than 60 years and increasing age.   °· Prolonged use of medicines containing female hormones.   °· Having diabetes mellitus.   °· Rapidly losing weight.   °· Having a family history of gallstones (heredity).   °SYMPTOMS °· Nausea.   °· Vomiting. °· Abdominal pain.   °· Yellowing of the skin (jaundice).   °· Sudden pain. It may persist from several minutes to several hours. °· Fever.   °· Tenderness to the touch.  °In some cases, when gallstones do not move into the bile duct, people have no pain or symptoms. These are called "silent" gallstones.  °TREATMENT °Silent gallstones do not need treatment. In severe cases, emergency surgery may be required. Options for treatment include: °· Surgery to remove the gallbladder. This is the most common treatment. °· Medicines. These do not always work and may take 6-12 months or more to work. °· Shock wave treatment (extracorporeal biliary lithotripsy). In this treatment an ultrasound machine sends shock waves to the gallbladder to break gallstones into smaller pieces that can pass into the intestines or be dissolved by medicine. °HOME CARE INSTRUCTIONS  °· Only take over-the-counter or prescription medicines for pain, discomfort, or fever as directed by your health care provider.   °· Follow a low-fat diet until  seen again by your health care provider. Fat causes the gallbladder to contract, which can result in pain.   °· Follow up with your health care provider as directed. Attacks are almost always recurrent and surgery is usually required for permanent treatment.   °SEEK IMMEDIATE MEDICAL CARE IF:  °· Your pain increases and is not controlled by medicines.   °· You have a fever or persistent symptoms for more than 2-3 days.   °· You have a fever and your symptoms suddenly get worse.   °· You have persistent nausea and vomiting.   °MAKE SURE YOU:  °· Understand these instructions. °· Will watch your condition. °· Will get help right away if you are not doing well or get worse. °Document Released: 05/26/2005 Document Revised: 01/30/2013 Document Reviewed: 11/21/2012 °[employer]® Patient Information ©2015 [employer]. This information is not intended to replace advice given to you by your health care provider. Make sure you discuss any questions you have with your health care provider. ° °

## 2015-02-18 NOTE — Progress Notes (Signed)
Name: Keyona Emrich   MRN: [id number]    DOB: [birth year]   Date:02/18/2015       Progress Note  Subjective  Chief Complaint  Chief Complaint  Patient presents with  . Referral    patient has been having issues with her gallstones and needs a consult.  . Constipation    patient stated that she has a had time having a BM, but she does she passes small red pebbles.    HPI  Huong Luthi is a 19 year old patient with obesity, DM II, pain in weight bearing joints, anemia. She is here today to request consultation regarding getting her gall bladder removed. She complains of chronic epigastric and RUQ pain radiating around right side to flank.  Not associated with meals. Associated with bloating and constipation. No nausea, vomiting. Patient also complains of constipation.  Stool pattern has been 2 formed and pellet like stool(s) per week. Onset was several years ago Defecation has been difficult and incomplete. Co-Morbid conditions:endocrine disease and obesity. Symptoms have been waxing and waning. Trying to eat more fiber in her diet, eating out 2x/week fast food, no regular exercise. Inadequate water intake.  Current OTC/RX therapy has been none which has been not very effective.  She also reports today that she stopped using the metformin as it may have been causing her to feel sick and have chest pain with headaches. Symptoms now resolved. Ceylin also complains of coughing up tonsil stones. No sore throat, fevers, chills, myalgias.     Patient Active Problem List   Diagnosis Date Noted  . Anemia, iron deficiency, inadequate dietary intake 12/01/2014  . Diabetes mellitus type 2, controlled 11/26/2014  . Headache disorder 11/26/2014  . Arthralgia of multiple joints 11/26/2014  . Obesity, Class II, BMI 35-39.9 11/26/2014  . Contact with and suspected exposure to infections with predominantly sexual mode of transmission 11/26/2014  . Missed period 11/26/2014  . Diabetes in pregnancy  06/24/2014    Social History  Substance Use Topics  . Smoking status: Never Smoker   . Smokeless tobacco: Not on file  . Alcohol Use: No     Current outpatient prescriptions:  .  ferrous sulfate 325 (65 FE) MG tablet, Take 1 tablet (325 mg total) by mouth daily with breakfast., Disp: 90 tablet, Rfl: 2 .  metFORMIN (GLUCOPHAGE) 500 MG tablet, Take 1 tablet (500 mg total) by mouth daily., Disp: 90 tablet, Rfl: 2 .  naproxen (NAPROSYN) 500 MG tablet, Take 1 tablet (500 mg total) by mouth 2 (two) times daily with a meal., Disp: 60 tablet, Rfl: 2 .  norethindrone-ethinyl estradiol (MICROGESTIN,JUNEL,LOESTRIN) 1-20 MG-MCG tablet, Take 1 tablet by mouth daily., Disp: , Rfl:   History reviewed. No pertinent past surgical history.  Family History  Problem Relation Age of Onset  . Diabetes Mother   . Arthritis Father   . Diabetes Father   . Heart disease Father   . Hypertension Father   . Stroke Father   . Hearing loss Sister   . Hypertension Brother   . Hypertension Paternal Grandfather     No Known Allergies   Review of Systems  CONSTITUTIONAL: No significant weight changes, fever, chills, weakness or fatigue.  HEENT:  - Eyes: No visual changes.  - Ears: No auditory changes. No pain.  - Nose: No sneezing, congestion, runny nose. - Throat: No sore throat. No changes in swallowing. Tonsil stones. SKIN: No rash or itching.  CARDIOVASCULAR: No chest pain, chest pressure or chest discomfort. No  palpitations or edema.  RESPIRATORY: No shortness of breath, cough or sputum.  GASTROINTESTINAL: No anorexia, nausea, vomiting. Constipation and abdominal pain. GENITOURINARY: No dysuria. No frequency. No discharge.  NEUROLOGICAL: No headache, dizziness, syncope, paralysis, ataxia, numbness or tingling in the extremities. No memory changes. No change in bowel or bladder control.  MUSCULOSKELETAL: No joint pain. No muscle pain. HEMATOLOGIC: No anemia, bleeding or bruising.  LYMPHATICS: No  enlarged lymph nodes.  PSYCHIATRIC: No change in mood. No change in sleep pattern.  ENDOCRINOLOGIC: No reports of sweating, cold or heat intolerance. No polyuria or polydipsia.     Objective  BP 118/72 mmHg  Pulse 107  Temp(Src) 98.2 F (36.8 C) (Oral)  Resp 18  Wt 231 lb 11.2 oz (105.098 kg)  SpO2 98%  LMP 09/16/2014 (Approximate) Body mass index is 35.24 kg/(m^2).  Physical Exam  Constitutional: Patient is obese and well-nourished. In no distress.  HEENT:  - Head: Normocephalic and atraumatic.  - Ears: Bilateral TMs gray, no erythema or effusion - Nose: Nasal mucosa moist - Mouth/Throat: Oropharynx is clear and moist. No tonsillar hypertrophy or erythema. No post nasal drainage.  - Eyes: Conjunctivae clear, EOM movements normal. PERRLA. No scleral icterus.  Neck: Normal range of motion. Neck supple. No JVD present. No thyromegaly present.  Cardiovascular: Normal rate, regular rhythm and normal heart sounds.  No murmur heard.  Pulmonary/Chest: Effort normal and breath sounds normal. No respiratory distress. Abdomen: Soft, obese, non tender, no guarding or rebound in all areas, normal bowel sounds in all areas. Musculoskeletal: Normal range of motion bilateral UE and LE, no joint effusions. Peripheral vascular: Bilateral LE no edema. Neurological: CN II-XII grossly intact with no focal deficits. Alert and oriented to person, place, and time. Coordination, balance, strength, speech and gait are normal.  Skin: Skin is warm and dry. No rash noted. No erythema.  Psychiatric: Patient has a normal mood and affect. Behavior is normal in office today. Judgment and thought content normal in office today.   Recent Results (from the past 2160 hour(s))  POCT urine pregnancy     Status: Normal   Collection Time: 11/26/14  8:43 AM  Result Value Ref Range   Preg Test, Ur Negative Negative  CMP14+EGFR     Status: Abnormal   Collection Time: 11/27/14  7:11 AM  Result Value Ref Range    Glucose 124 (H) 65 - 99 mg/dL   BUN 7 6 - 20 mg/dL   Creatinine, Ser 0.69 0.57 - 1.00 mg/dL   GFR calc non Af Amer 125 >59 mL/min/1.73   GFR calc Af Amer 144 >59 mL/min/1.73   BUN/Creatinine Ratio 10 8 - 20   Sodium 138 134 - 144 mmol/L   Potassium 4.1 3.5 - 5.2 mmol/L   Chloride 100 97 - 108 mmol/L   CO2 22 18 - 29 mmol/L   Calcium 9.2 8.7 - 10.2 mg/dL   Total Protein 7.0 6.0 - 8.5 g/dL   Albumin 4.4 3.5 - 5.5 g/dL   Globulin, Total 2.6 1.5 - 4.5 g/dL   Albumin/Globulin Ratio 1.7 1.1 - 2.5   Bilirubin Total <0.2 0.0 - 1.2 mg/dL   Alkaline Phosphatase 89 39 - 117 IU/L   AST 16 0 - 40 IU/L   ALT 13 0 - 32 IU/L  CBC with Differential/Platelet     Status: Abnormal   Collection Time: 11/27/14  7:17 AM  Result Value Ref Range   WBC 5.0 3.4 - 10.8 x10E3/uL   RBC 4.62 3.77 -  5.28 x10E6/uL   Hemoglobin 10.6 (L) 11.1 - 15.9 g/dL   Hematocrit 33.2 (L) 34.0 - 46.6 %   MCV 72 (L) 79 - 97 fL   MCH 22.9 (L) 26.6 - 33.0 pg   MCHC 31.9 31.5 - 35.7 g/dL   RDW 18.4 (H) 12.3 - 15.4 %   Platelets 300 150 - 379 x10E3/uL   Neutrophils 46 %   Lymphs 46 %   Monocytes 6 %   Eos 2 %   Basos 0 %   Neutrophils Absolute 2.3 1.4 - 7.0 x10E3/uL   Lymphocytes Absolute 2.4 0.7 - 3.1 x10E3/uL   Monocytes Absolute 0.3 0.1 - 0.9 x10E3/uL   EOS (ABSOLUTE) 0.1 0.0 - 0.4 x10E3/uL   Basophils Absolute 0.0 0.0 - 0.2 x10E3/uL   Immature Granulocytes 0 %   Immature Grans (Abs) 0.0 0.0 - 0.1 x10E3/uL  Lipid panel     Status: None   Collection Time: 11/27/14  7:17 AM  Result Value Ref Range   Cholesterol, Total 152 100 - 199 mg/dL   Triglycerides 117 0 - 149 mg/dL   HDL 60 >39 mg/dL    Comment: According to ATP-III Guidelines, HDL-C >59 mg/dL is considered a negative risk factor for CHD.    VLDL Cholesterol Cal 23 5 - 40 mg/dL   LDL Calculated 69 0 - 99 mg/dL   Chol/HDL Ratio 2.5 0.0 - 4.4 ratio units    Comment:                                   T. Chol/HDL Ratio                                              Men  Women                               1/2 Avg.Risk  3.4    3.3                                   Avg.Risk  5.0    4.4                                2X Avg.Risk  9.6    7.1                                3X Avg.Risk 23.4   11.0   TSH     Status: None   Collection Time: 11/27/14  7:17 AM  Result Value Ref Range   TSH 1.120 0.450 - 4.500 uIU/mL  Sedimentation rate     Status: None   Collection Time: 11/27/14  7:17 AM  Result Value Ref Range   Sed Rate 16 0 - 32 mm/hr  Rheumatoid factor     Status: None   Collection Time: 11/27/14  7:17 AM  Result Value Ref Range   Rhuematoid fact SerPl-aCnc 11.3 0.0 - 13.9 IU/mL  HgB A1c     Status: Abnormal   Collection Time: 11/27/14  7:17 AM  Result Value  Ref Range   Hgb A1c MFr Bld 6.7 (H) 4.8 - 5.6 %    Comment:          Pre-diabetes: 5.7 - 6.4          Diabetes: >6.4          Glycemic control for adults with diabetes: <7.0    Est. average glucose Bld gHb Est-mCnc 146 mg/dL  Antinuclear Antib (ANA)     Status: None   Collection Time: 11/27/14  7:17 AM  Result Value Ref Range   Anit Nuclear Antibody(ANA) Negative Negative  Prolactin     Status: Abnormal   Collection Time: 11/27/14  7:17 AM  Result Value Ref Range   Prolactin 40.9 (H) 4.8 - 23.3 ng/mL  DHEA-sulfate     Status: None   Collection Time: 11/27/14  7:17 AM  Result Value Ref Range   DHEA-SO4 144.7 110.0 - 431.7 ug/dL  Beta HCG, Quant (tumor marker)     Status: None   Collection Time: 11/27/14  7:17 AM  Result Value Ref Range   hCG Quant <1 mIU/mL    Comment:                      Female (Non-pregnant)    0 -     5                             (Postmenopausal)  0 -     8                      Female (Pregnant)                      Weeks of Gestation                              3                6 -    71                              4               10 -   750                              5              217 -  7138                              6              158 - 09735                               7             [id number] -[id number]                              8            [id number]  [place] CLIA methodology   Specimen status report     Status: None   Collection Time: 11/27/14  7:17 AM  Result Value Ref Range   specimen status report Comment     Comment: Tracking [facility] Error Written Authorization Written Authorization Written Authorization Received. Authorization received from Original Request 12-01-2014 Logged by [facility]   CBC     Status: Abnormal   Collection Time: 12/05/14  7:45 PM  Result Value Ref Range   WBC 16.1 (H) 3.6 - 11.0 K/uL   RBC 4.81 3.80 - 5.20 MIL/uL   Hemoglobin 11.1 (L) 12.0 - 16.0 g/dL   HCT 35.1 35.0 - 47.0 %   MCV 72.9 (L) 80.0 - 100.0 fL   MCH 23.1 (L) 26.0 - 34.0 pg   MCHC 31.7 (L) 32.0 - 36.0 g/dL   RDW 18.8 (H) 11.5 - 14.5 %   Platelets 283 150 - 440 K/uL  Comprehensive metabolic panel     Status: Abnormal   Collection Time: 12/05/14  7:45 PM  Result Value Ref Range   Sodium 137 135 - 145 mmol/L   Potassium 4.2 3.5 - 5.1 mmol/L   Chloride 104 101 - 111 mmol/L   CO2 24 22 - 32 mmol/L   Glucose, Bld 155 (H) 65 - 99 mg/dL   BUN 10 6 - 20 mg/dL   Creatinine, Ser 0.75 0.44 - 1.00 mg/dL   Calcium 9.4 8.9 - 10.3 mg/dL   Total Protein 8.4 (H) 6.5 - 8.1 g/dL   Albumin 4.2 3.5 - 5.0 g/dL   AST 27 15 - 41 U/L   ALT 25 14 - 54 U/L  Alkaline Phosphatase 86 38 - 126 U/L   Total Bilirubin 0.4 0.3 - 1.2 mg/dL   GFR calc non Af Amer >60 >60 mL/min   GFR calc Af Amer >60 >60 mL/min     Comment: (NOTE) The eGFR has been calculated using the CKD EPI equation. This calculation has not been validated in all clinical situations. eGFR's persistently <60 mL/min signify possible Chronic Kidney Disease.    Anion gap 9 5 - 15  Troponin I     Status: None   Collection Time: 12/05/14  7:45 PM  Result Value Ref Range   Troponin I <0.03 <0.031 ng/mL    Comment:        NO INDICATION OF MYOCARDIAL INJURY.   Urinalysis complete, with microscopic ([facility] only)     Status: Abnormal   Collection Time: 12/05/14  9:14 PM  Result Value Ref Range   Color, Urine YELLOW (A) YELLOW   APPearance CLEAR (A) CLEAR   Glucose, UA NEGATIVE NEGATIVE mg/dL   Bilirubin Urine NEGATIVE NEGATIVE   Ketones, ur NEGATIVE NEGATIVE mg/dL   Specific Gravity, Urine 1.026 1.005 - 1.030   Hgb urine dipstick NEGATIVE NEGATIVE   pH 5.0 5.0 - 8.0   Protein, ur 100 (A) NEGATIVE mg/dL   Nitrite NEGATIVE NEGATIVE   Leukocytes, UA NEGATIVE NEGATIVE   RBC / HPF 0-5 0 - 5 RBC/hpf   WBC, UA 0-5 0 - 5 WBC/hpf   Bacteria, UA NONE SEEN NONE SEEN   Squamous Epithelial / LPF 0-5 (A) NONE SEEN   Mucous PRESENT   POCT rapid strep A [facility])     Status: None   Collection Time: 12/06/14 12:12 AM  Result Value Ref Range   Streptococcus, Group A Screen (Direct) NEGATIVE NEGATIVE  Lipase, blood     Status: None   Collection Time: 02/16/15  9:41 PM  Result Value Ref Range   Lipase 24 22 - 51 U/L  CBC WITH DIFFERENTIAL     Status: Abnormal   Collection Time: 02/16/15  9:41 PM  Result Value Ref Range   WBC 6.8 3.6 - 11.0 K/uL   RBC 4.87 3.80 - 5.20 MIL/uL   Hemoglobin 12.0 12.0 - 16.0 g/dL   HCT 37.6 35.0 - 47.0 %   MCV 77.1 (L) 80.0 - 100.0 fL   MCH 24.7 (L) 26.0 - 34.0 pg   MCHC 32.0 32.0 - 36.0 g/dL   RDW 19.2 (H) 11.5 - 14.5 %   Platelets 300 150 - 440 K/uL   Neutrophils Relative % 54 %   Neutro Abs 3.7 1.4 - 6.5 K/uL   Lymphocytes Relative 39 %   Lymphs Abs 2.6 1.0 - 3.6 K/uL   Monocytes Relative 5  %   Monocytes Absolute 0.3 0.2 - 0.9 K/uL   Eosinophils Relative 2 %   Eosinophils Absolute 0.1 0 - 0.7 K/uL   Basophils Relative 0 %   Basophils Absolute 0.0 0 - 0.1 K/uL  Comprehensive metabolic panel     Status: Abnormal   Collection Time: 02/16/15  9:41 PM  Result Value Ref Range   Sodium 139 135 - 145 mmol/L   Potassium 3.9 3.5 - 5.1 mmol/L   Chloride 104 101 - 111 mmol/L   CO2 28 22 - 32 mmol/L   Glucose, Bld 137 (H) 65 - 99 mg/dL   BUN 9 6 - 20 mg/dL   Creatinine, Ser 0.68 0.44 - 1.00 mg/dL   Calcium 9.9 8.9 - 10.3 mg/dL  Total Protein 7.9 6.5 - 8.1 g/dL   Albumin 4.3 3.5 - 5.0 g/dL   AST 26 15 - 41 U/L   ALT 19 14 - 54 U/L   Alkaline Phosphatase 66 38 - 126 U/L   Total Bilirubin 0.4 0.3 - 1.2 mg/dL   GFR calc non Af Amer >60 >60 mL/min   GFR calc Af Amer >60 >60 mL/min    Comment: (NOTE) The eGFR has been calculated using the CKD EPI equation. This calculation has not been validated in all clinical situations. eGFR's persistently <60 mL/min signify possible Chronic Kidney Disease.    Anion gap 7 5 - 15     Assessment & Plan  1. Diabetes mellitus type 2, controlled Instructed patient to restart metformin at least 225m a day. If symptoms of headache, chest pain return she is instructed to call me.  - metFORMIN (GLUCOPHAGE) 500 MG tablet; Take 1 tablet (500 mg total) by mouth daily.  Dispense: 30 tablet; Refill: 3  2. Constipation, slow transit Will get imaging and start her on colace. Needs to increase fiber in diet, avoid fast food, drink more water and lose weight.  - [place]; Future - docusate sodium (COLACE) 100 MG capsule; Take 1 capsule (100 mg total) by mouth 2 (two) times daily.  Dispense: 60 capsule; Refill: 3  3. Chronic right upper quadrant pain Will get imaging.  - [place] Complete; Future  4. Tonsil stone Gargle with mouth wash twice a day.

## 2015-02-27 ENCOUNTER — Ambulatory Visit: Payer: Medicaid Other

## 2015-03-04 ENCOUNTER — Telehealth: Payer: Self-pay | Admitting: Family Medicine

## 2015-03-04 NOTE — Telephone Encounter (Signed)
Authorization was received.   [facility] #[id number], expiring on 03/28/15.

## 2015-03-04 NOTE — Telephone Encounter (Signed)
Penny Gonzalez from [facility] states patient is having a abdominal ultrasound and it is needing authorization [id number] if you have any questions

## 2015-03-04 NOTE — Telephone Encounter (Signed)
The authorization for patient's [place] is [id number] expires 03/28/2015.  This is information was given to Konrad Dolores with pre service at [facility] on 03/04/15.

## 2015-03-05 ENCOUNTER — Ambulatory Visit
Admission: RE | Admit: 2015-03-05 | Discharge: 2015-03-05 | Disposition: A | Discharge status: Home / Self Care | Payer: Medicaid Other | Source: Ambulatory Visit | Attending: Family Medicine | Admitting: Family Medicine

## 2015-03-05 DIAGNOSIS — K5901 Slow transit constipation: Secondary | ICD-10-CM | POA: Diagnosis present

## 2015-03-05 DIAGNOSIS — K8020 Calculus of gallbladder without cholecystitis without obstruction: Secondary | ICD-10-CM | POA: Insufficient documentation

## 2015-03-05 DIAGNOSIS — R1011 Right upper quadrant pain: Secondary | ICD-10-CM

## 2015-03-05 DIAGNOSIS — G8929 Other chronic pain: Secondary | ICD-10-CM | POA: Diagnosis present

## 2015-03-05 DIAGNOSIS — R1010 Upper abdominal pain, unspecified: Secondary | ICD-10-CM | POA: Insufficient documentation

## 2015-03-08 ENCOUNTER — Encounter: Payer: Self-pay | Admitting: Emergency Medicine

## 2015-03-08 ENCOUNTER — Emergency Department
Admission: EM | Admit: 2015-03-08 | Discharge: 2015-03-08 | Disposition: A | Discharge status: Home / Self Care | Payer: Medicaid Other | Attending: Student | Admitting: Student

## 2015-03-08 DIAGNOSIS — N898 Other specified noninflammatory disorders of vagina: Secondary | ICD-10-CM | POA: Diagnosis present

## 2015-03-08 DIAGNOSIS — Z79899 Other long term (current) drug therapy: Secondary | ICD-10-CM | POA: Diagnosis not present

## 2015-03-08 DIAGNOSIS — Z791 Long term (current) use of non-steroidal anti-inflammatories (NSAID): Secondary | ICD-10-CM | POA: Insufficient documentation

## 2015-03-08 DIAGNOSIS — E119 Type 2 diabetes mellitus without complications: Secondary | ICD-10-CM | POA: Insufficient documentation

## 2015-03-08 DIAGNOSIS — Z3202 Encounter for pregnancy test, result negative: Secondary | ICD-10-CM | POA: Diagnosis not present

## 2015-03-08 LAB — BASIC METABOLIC PANEL
ANION GAP: 8 (ref 5–15)
BUN: 9 mg/dL (ref 6–20)
CHLORIDE: 104 mmol/L (ref 101–111)
CO2: 28 mmol/L (ref 22–32)
Calcium: 9.6 mg/dL (ref 8.9–10.3)
Creatinine, Ser: 0.71 mg/dL (ref 0.44–1.00)
GFR calc non Af Amer: >60 mL/min (ref >60)
Glucose, Bld: 130 mg/dL — ABNORMAL HIGH (ref 65–99)
POTASSIUM: 3.7 mmol/L (ref 3.5–5.1)
Sodium: 140 mmol/L (ref 135–145)

## 2015-03-08 LAB — WET PREP, GENITAL
Clue Cells Wet Prep HPF POC: NONE SEEN
Trich, Wet Prep: NONE SEEN
Yeast Wet Prep HPF POC: NONE SEEN

## 2015-03-08 LAB — CBC WITH DIFFERENTIAL/PLATELET
Basophils Absolute: 0 10*3/uL (ref 0–0.1)
Basophils Relative: 0 %
Eosinophils Absolute: 0.1 10*3/uL (ref 0–0.7)
Eosinophils Relative: 1 %
HEMATOCRIT: 38.4 % (ref 35.0–47.0)
HEMOGLOBIN: 12.4 g/dL (ref 12.0–16.0)
LYMPHS ABS: 2.7 10*3/uL (ref 1.0–3.6)
LYMPHS PCT: 35 %
MCH: 25.2 pg — AB (ref 26.0–34.0)
MCHC: 32.3 g/dL (ref 32.0–36.0)
MCV: 77.9 fL — ABNORMAL LOW (ref 80.0–100.0)
MONOS PCT: 5 %
Monocytes Absolute: 0.4 10*3/uL (ref 0.2–0.9)
NEUTROS ABS: 4.6 10*3/uL (ref 1.4–6.5)
NEUTROS PCT: 59 %
Platelets: 263 10*3/uL (ref 150–440)
RBC: 4.93 MIL/uL (ref 3.80–5.20)
RDW: 19 % — ABNORMAL HIGH (ref 11.5–14.5)
WBC: 7.8 10*3/uL (ref 3.6–11.0)

## 2015-03-08 LAB — URINALYSIS COMPLETE WITH MICROSCOPIC (ARMC ONLY)
Bacteria, UA: NONE SEEN
Bilirubin Urine: NEGATIVE
Glucose, UA: NEGATIVE mg/dL
Ketones, ur: NEGATIVE mg/dL
Nitrite: NEGATIVE
Protein, ur: 100 mg/dL — AB
Specific Gravity, Urine: 1.03 (ref 1.005–1.030)
pH: 5 (ref 5.0–8.0)

## 2015-03-08 LAB — CHLAMYDIA/NGC RT PCR (ARMC ONLY)
CHLAMYDIA TR: NOT DETECTED
N gonorrhoeae: NOT DETECTED

## 2015-03-08 LAB — POCT PREGNANCY, URINE: Preg Test, Ur: NEGATIVE

## 2015-03-08 NOTE — ED Notes (Signed)
Pt reports vaginal discharge for about 2 or 3 days. Denies any odor to discharge reports wanted to be check for STD.

## 2015-03-08 NOTE — ED Provider Notes (Signed)
[facility] Emergency Department Provider Note  ____________________________________________  Time seen: Approximately 11:19 AM  I have reviewed the triage vital signs and the nursing notes.   HISTORY  Chief Complaint Vaginal Discharge  HPI Penny Gonzalez is a 19 y.o. female is here with complaint of vaginal discharge for the approximately 2-3 days. Patient denies any odor. She told triage nurses that she wanted to be checked for STDs but will not elaborate in the exam room. She states that she also has some cramping with some abdominal pain. She denies any vomiting or diarrhea. She denies any fever or chills. She does have her 8-month-old daughter with her. She is a patient at [facility].   Past Medical History  Diagnosis Date  . Diabetes mellitus without complication   . Irregular periods/menstrual cycles   . Joint pain of leg   . Depression     patient has not been officially diagnosed but has symptoms  . Anemia, iron deficiency, inadequate dietary intake 12/01/2014    Patient Active Problem List   Diagnosis Date Noted  . Constipation, slow transit 02/18/2015  . Chronic right upper quadrant pain 02/18/2015  . Tonsil stone 02/18/2015  . Anemia, iron deficiency, inadequate dietary intake 12/01/2014  . Diabetes mellitus type 2, controlled 11/26/2014  . Headache disorder 11/26/2014  . Arthralgia of multiple joints 11/26/2014  . Obesity, Class II, BMI 35-39.9 11/26/2014  . Contact with and suspected exposure to infections with predominantly sexual mode of transmission 11/26/2014  . Missed period 11/26/2014  . Diabetes in pregnancy 06/24/2014    History reviewed. No pertinent past surgical history.  Current Outpatient Rx  Name  Route  Sig  Dispense  Refill  . docusate sodium (COLACE) 100 MG capsule   Oral   Take 1 capsule (100 mg total) by mouth 2 (two) times daily.   60 capsule   3   . ferrous sulfate 325 (65 FE) MG tablet   Oral  Take 1 tablet (325 mg total) by mouth daily with breakfast.   90 tablet   2   . metFORMIN (GLUCOPHAGE) 500 MG tablet   Oral   Take 1 tablet (500 mg total) by mouth daily.   30 tablet   3   . naproxen (NAPROSYN) 500 MG tablet   Oral   Take 1 tablet (500 mg total) by mouth 2 (two) times daily with a meal.   60 tablet   2   . norethindrone-ethinyl estradiol (MICROGESTIN,JUNEL,LOESTRIN) 1-20 MG-MCG tablet   Oral   Take 1 tablet by mouth daily.           Allergies Review of patient's allergies indicates no known allergies.  Family History  Problem Relation Age of Onset  . Diabetes Mother   . Arthritis Father   . Diabetes Father   . Heart disease Father   . Hypertension Father   . Stroke Father   . Hearing loss Sister   . Hypertension Brother   . Hypertension Paternal Grandfather     Social History Social History  Substance Use Topics  . Smoking status: Never Smoker   . Smokeless tobacco: None  . Alcohol Use: No    Review of Systems Constitutional: No fever/chills ENT: No sore throat. Cardiovascular: Denies chest pain. Respiratory: Denies shortness of breath. Gastrointestinal: Positive lower abdominal pain.  No nausea, no vomiting.  No diarrhea.  No constipation. Genitourinary: Negative for dysuria. Musculoskeletal: Negative for back pain. Skin: Negative for rash. Neurological: Negative for headaches, focal  weakness or numbness.  10-point ROS otherwise negative.  ____________________________________________   PHYSICAL EXAM:  VITAL SIGNS: ED Triage Vitals  Enc Vitals Group     BP 03/08/15 1059 118/89 mmHg     Pulse Rate 03/08/15 1059 87     Resp 03/08/15 1059 20     Temp 03/08/15 1059 98.7 F (37.1 C)     Temp Source 03/08/15 1059 Oral     SpO2 03/08/15 1059 96 %     Weight 03/08/15 1059 231 lb (104.781 kg)     Height 03/08/15 1059  (1.753 m)     Head Cir --      Peak Flow --      Pain Score --      Pain Loc --      Pain Edu? --       Excl. in GC? --     Constitutional: Alert and oriented. Well appearing and in no acute distress. Eyes: Conjunctivae are normal. PERRL. EOMI. Head: Atraumatic. Nose: No congestion/rhinnorhea. Mouth/Throat: Mucous membranes are moist.  Oropharynx non-erythematous. Neck: No stridor.   Cardiovascular: Normal rate, regular rhythm. Grossly normal heart sounds.  Good peripheral circulation. Respiratory: Normal respiratory effort.  No retractions. Lungs CTAB. Gastrointestinal: Soft and nontender. No distention.  No CVA tenderness. Bowel sounds normal 4 quadrants. Genitourinary: Pelvic exam: no abnormalities external exam. There is minimal mucus-like secretions but no discharge or exudate was seen. There is no cervical motion tenderness. There is no adnexal masses or tenderness. Musculoskeletal: Moves upper extremities without any difficulty No lower extremity tenderness nor edema.  No joint effusions. Neurologic:  Normal speech and language. No gross focal neurologic deficits are appreciated. No gait instability. Skin:  Skin is warm, dry and intact. No rash noted. Psychiatric: Mood and affect are normal. Speech and behavior are normal.  ____________________________________________   LABS (all labs ordered are listed, but only abnormal results are displayed)  Labs Reviewed  WET PREP, GENITAL - Abnormal; Notable for the following:    WBC, Wet Prep HPF POC FEW (*)    All other components within normal limits  URINALYSIS COMPLETEWITH MICROSCOPIC ([facility] ONLY) - Abnormal; Notable for the following:    Color, Urine YELLOW (*)    APPearance CLEAR (*)    Hgb urine dipstick 1+ (*)    Protein, ur 100 (*)    Leukocytes, UA 1+ (*)    Squamous Epithelial / LPF 6-30 (*)    All other components within normal limits  BASIC METABOLIC PANEL - Abnormal; Notable for the following:    Glucose, Bld 130 (*)    All other components within normal limits  CBC WITH DIFFERENTIAL/PLATELET - Abnormal; Notable for  the following:    MCV 77.9 (*)    MCH 25.2 (*)    RDW 19.0 (*)    All other components within normal limits  CHLAMYDIA/NGC RT PCR ([facility] ONLY)  POC URINE PREG, ED  POCT PREGNANCY, URINE    ___________________________________________   PROCEDURES  Procedure(s) performed: None  Critical Care performed: No  ____________________________________________   INITIAL IMPRESSION / ASSESSMENT AND PLAN / ED COURSE  Pertinent labs & imaging results that were available during my care of the patient were reviewed by me and considered in my medical decision making (see chart for details).  Patient was made aware that her wet prep, GC and chlamydia test were all negative. Urinalysis is clear. Patient will follow-up with her gynecologist at [facility] if any continued problems. She is return to the  emergency room if any fever chills or severe worsening of her vaginal discomfort. ____________________________________________   FINAL CLINICAL IMPRESSION(S) / ED DIAGNOSES  Final diagnoses:  Vaginal discharge      Tommi Rumps, PA-C 03/08/15 2201  Gayla Doss, MD 03/09/15 2320

## 2015-03-08 NOTE — Discharge Instructions (Signed)
FOLLOW UP WITH YOUR DOCTOR AT [facility] IF ANY CONTINUED PROBLEMS

## 2015-03-09 ENCOUNTER — Telehealth: Payer: Self-pay

## 2015-03-09 NOTE — Telephone Encounter (Signed)
Patient returned my call and after she verified her date of birth, ultrasound results were reviewed. Patient was then informed of her options: surgical consult or G.I. referral then was told to think about the 2 and give [place] a call back with her decision. She agreed and said ok.

## 2015-03-10 ENCOUNTER — Telehealth: Payer: Self-pay | Admitting: Family Medicine

## 2015-03-10 ENCOUNTER — Other Ambulatory Visit: Payer: Self-pay | Admitting: Family Medicine

## 2015-03-10 DIAGNOSIS — R1011 Right upper quadrant pain: Principal | ICD-10-CM

## 2015-03-10 DIAGNOSIS — G8929 Other chronic pain: Secondary | ICD-10-CM

## 2015-03-10 DIAGNOSIS — K8020 Calculus of gallbladder without cholecystitis without obstruction: Secondary | ICD-10-CM | POA: Insufficient documentation

## 2015-03-10 NOTE — Telephone Encounter (Signed)
Referral to [facility] placed. 

## 2015-03-10 NOTE — Telephone Encounter (Signed)
Patient has requested to see a GI specialist before seeing a surgeon

## 2015-03-12 NOTE — Telephone Encounter (Signed)
Patient was informed that referral was placed and that she should hear a call from their office with an appt date and time.

## 2015-03-19 ENCOUNTER — Other Ambulatory Visit: Payer: Self-pay | Admitting: Student

## 2015-03-19 DIAGNOSIS — R933 Abnormal findings on diagnostic imaging of other parts of digestive tract: Secondary | ICD-10-CM

## 2015-03-19 DIAGNOSIS — R1011 Right upper quadrant pain: Secondary | ICD-10-CM

## 2015-04-17 ENCOUNTER — Ambulatory Visit: Payer: Medicaid Other

## 2015-04-21 ENCOUNTER — Ambulatory Visit
Admission: RE | Admit: 2015-04-21 | Discharge: 2015-04-21 | Disposition: A | Discharge status: Home / Self Care | Payer: Medicaid Other | Source: Ambulatory Visit | Attending: Student | Admitting: Student

## 2015-04-21 DIAGNOSIS — R1011 Right upper quadrant pain: Secondary | ICD-10-CM

## 2015-04-21 DIAGNOSIS — R933 Abnormal findings on diagnostic imaging of other parts of digestive tract: Secondary | ICD-10-CM

## 2015-04-30 ENCOUNTER — Ambulatory Visit
Admission: RE | Admit: 2015-04-30 | Discharge: 2015-04-30 | Disposition: A | Discharge status: Home / Self Care | Payer: Medicaid Other | Source: Ambulatory Visit | Attending: Student | Admitting: Student

## 2015-04-30 DIAGNOSIS — E119 Type 2 diabetes mellitus without complications: Secondary | ICD-10-CM | POA: Insufficient documentation

## 2015-04-30 DIAGNOSIS — R933 Abnormal findings on diagnostic imaging of other parts of digestive tract: Secondary | ICD-10-CM | POA: Insufficient documentation

## 2015-04-30 DIAGNOSIS — G8929 Other chronic pain: Secondary | ICD-10-CM | POA: Diagnosis not present

## 2015-04-30 DIAGNOSIS — R1011 Right upper quadrant pain: Secondary | ICD-10-CM | POA: Diagnosis not present

## 2015-04-30 MED ORDER — TECHNETIUM TC 99M MEBROFENIN IV KIT
5.0000 | PACK | Freq: Once | INTRAVENOUS | Status: DC | PRN
  Administered 2015-04-30: 4.86 via INTRAVENOUS
  Filled 2015-04-30: qty 6

## 2015-05-19 ENCOUNTER — Encounter: Payer: Self-pay | Admitting: Sports Medicine

## 2015-05-19 ENCOUNTER — Ambulatory Visit (INDEPENDENT_AMBULATORY_CARE_PROVIDER_SITE_OTHER): Payer: Medicaid Other

## 2015-05-19 ENCOUNTER — Ambulatory Visit (INDEPENDENT_AMBULATORY_CARE_PROVIDER_SITE_OTHER): Payer: Medicaid Other | Admitting: Sports Medicine

## 2015-05-19 DIAGNOSIS — M216X9 Other acquired deformities of unspecified foot: Secondary | ICD-10-CM

## 2015-05-19 DIAGNOSIS — M2141 Flat foot [pes planus] (acquired), right foot: Secondary | ICD-10-CM | POA: Diagnosis not present

## 2015-05-19 DIAGNOSIS — M7662 Achilles tendinitis, left leg: Secondary | ICD-10-CM

## 2015-05-19 DIAGNOSIS — M7661 Achilles tendinitis, right leg: Secondary | ICD-10-CM | POA: Diagnosis not present

## 2015-05-19 DIAGNOSIS — M2142 Flat foot [pes planus] (acquired), left foot: Secondary | ICD-10-CM

## 2015-05-19 DIAGNOSIS — B353 Tinea pedis: Secondary | ICD-10-CM

## 2015-05-19 DIAGNOSIS — M79673 Pain in unspecified foot: Secondary | ICD-10-CM

## 2015-05-19 DIAGNOSIS — M21869 Other specified acquired deformities of unspecified lower leg: Secondary | ICD-10-CM

## 2015-05-19 MED ORDER — MELOXICAM 15 MG PO TABS: 15.0000 mg | ORAL_TABLET | Freq: Every day | ORAL | Status: DC

## 2015-05-19 MED ORDER — TERBINAFINE HCL 1 % EX CREA: 1.0000 "application " | TOPICAL_CREAM | Freq: Two times a day (BID) | CUTANEOUS | Status: DC

## 2015-05-19 NOTE — Patient Instructions (Signed)
Achilles Tendinitis Achilles tendinitis is inflammation of the tough, cord-like band that attaches the lower muscles of your leg to your heel (Achilles tendon). It is usually caused by overusing the tendon and joint involved.  CAUSES Achilles tendinitis can happen because of:  A sudden increase in exercise or activity (such as running).  Doing the same exercises or activities (such as jumping) over and over.  Not warming up calf muscles before exercising.  Exercising in shoes that are worn out or not made for exercise.  Having arthritis or a bone growth on the back of the heel bone. This can rub against the tendon and hurt the tendon. SIGNS AND SYMPTOMS The most common symptoms are:  Pain in the back of the leg, just above the heel. The pain usually gets worse with exercise and better with rest.  Stiffness or soreness in the back of the leg, especially in the morning.  Swelling of the skin over the Achilles tendon.  Trouble standing on tiptoe. Sometimes, an Achilles tendon tears (ruptures). Symptoms of an Achilles tendon rupture can include:  Sudden, severe pain in the back of the leg.  Trouble putting weight on the foot or walking normally. DIAGNOSIS Achilles tendinitis will be diagnosed based on symptoms and a physical examination. An X-ray may be done to check if another condition is causing your symptoms. An MRI may be ordered if your health care provider suspects you may have completely torn your tendon, which is called an Achilles tendon rupture.  TREATMENT  Achilles tendinitis usually gets better over time. It can take weeks to months to heal completely. Treatment focuses on treating the symptoms and helping the injury heal. HOME CARE INSTRUCTIONS   Rest your Achilles tendon and avoid activities that cause pain.  Apply ice to the injured area:  Put ice in a plastic bag.  Place a towel between your skin and the bag.  Leave the ice on for 20 minutes, 2-3 times a  day  Try to avoid using the tendon (other than gentle range of motion) while the tendon is painful. Do not resume use until instructed by your health care provider. Then begin use gradually. Do not increase use to the point of pain. If pain does develop, decrease use and continue the above measures. Gradually increase activities that do not cause discomfort until you achieve normal use.  Do exercises to make your calf muscles stronger and more flexible. Your health care provider or physical therapist can recommend exercises for you to do.  Wrap your ankle with an elastic bandage or other wrap. This can help keep your tendon from moving too much. Your health care provider will show you how to wrap your ankle correctly.  Only take over-the-counter or prescription medicines for pain, discomfort, or fever as directed by your health care provider. SEEK MEDICAL CARE IF:   Your pain and swelling increase or pain is uncontrolled with medicines.  You develop new, unexplained symptoms or your symptoms get worse.  You are unable to move your toes or foot.  You develop warmth and swelling in your foot.  You have an unexplained temperature. MAKE SURE YOU:   Understand these instructions.  Will watch your condition.  Will get help right away if you are not doing well or get worse.   This information is not intended to replace advice given to you by your health care provider. Make sure you discuss any questions you have with your health care provider.   Document Released:   03/09/2005 Document Revised: 06/20/2014 Document Reviewed: 01/09/2013 Elsevier Interactive Patient Education 2016 Elsevier Inc.  

## 2015-05-19 NOTE — Progress Notes (Signed)
Patient ID: Ephriam KnucklesSamantha Yordy, female   DOB: 08/02/1995, 19 y.o.   MRN: [id number]   Subjective: Ephriam KnucklesSamantha Calaway is a 19 y.o. female patient who presents to office for evaluation of bilateral foot pain. Patient complains of progressive pain especially over the last few months in both feet all over; reports that she works long hours at [employer] and sometimes the pain goes up both the back of both legs and is sometimes is in her arches and occassional tingling in toes.   Patient denies any other pedal complaints. Denies injury/trip/fall/sprain/any causative factors.   FBS 96mg /dl  D6LA1C 6.7  Patient Active Problem List   Diagnosis Date Noted  . Cholelithiasis 03/10/2015  . Cholelithiasis without obstruction 03/10/2015  . Constipation, slow transit 02/18/2015  . Chronic right upper quadrant pain 02/18/2015  . Tonsil stone 02/18/2015  . Anemia, iron deficiency, inadequate dietary intake 12/01/2014  . Diabetes mellitus type 2, controlled (HCC) 11/26/2014  . Headache disorder 11/26/2014  . Arthralgia of multiple joints 11/26/2014  . Obesity, Class II, BMI 35-39.9 11/26/2014  . Contact with and suspected exposure to infections with predominantly sexual mode of transmission 11/26/2014  . Missed period 11/26/2014  . Diabetes in pregnancy (HCC) 06/24/2014   Current Outpatient Prescriptions on File Prior to Visit  Medication Sig Dispense Refill  . docusate sodium (COLACE) 100 MG capsule Take 1 capsule (100 mg total) by mouth 2 (two) times daily. 60 capsule 3  . ferrous sulfate 325 (65 FE) MG tablet Take 1 tablet (325 mg total) by mouth daily with breakfast. 90 tablet 2  . metFORMIN (GLUCOPHAGE) 500 MG tablet Take 1 tablet (500 mg total) by mouth daily. 30 tablet 3  . naproxen (NAPROSYN) 500 MG tablet Take 1 tablet (500 mg total) by mouth 2 (two) times daily with a meal. 60 tablet 2  . norethindrone-ethinyl estradiol (MICROGESTIN,JUNEL,LOESTRIN) 1-20 MG-MCG tablet Take 1 tablet by mouth daily.      No current facility-administered medications on file prior to visit.   No Known Allergies   Objective:  General: Alert and oriented x3 in no acute distress  Dermatology: No open lesions bilateral lower extremities, no webspace macerations, no ecchymosis bilateral, mild scaly skin plantar sulcus bilateral feet resembling tinea with no signs of infection, all nails x 10 are well manicured.  Vascular: Dorsalis Pedis and Posterior Tibial pedal pulses 2/4, Capillary Fill Time 3 seconds,(+) pedal hair growth bilateral, no edema bilateral lower extremities, Temperature gradient within normal limits.  Neurology: Gross sensation intact via light touch bilateral, Protective sensation intact  with Semmes Weinstein Monofilament to all pedal sites, Position sense intact, vibratory intact bilateral, Deep tendon reflexes within normal limits bilateral, No babinski sign present bilateral. (- )Tinels sign.  Musculoskeletal: Mild tenderness with palpation at achilles tendon insertion no other areas of pedal pain reproduced on exam,No pain with calf compression bilateral. There is decreased ankle rom with knee extending  vs flexed resembling gastroc equnius bilateral, Subtalar joint range of motion is within normal limits, there is no 1st ray hypermobility noted bilateral, Pes planus foot type bilateral, strength within normal limits in all groups bilateral.   Gait: Non-Antalgic gait with increased medial arch collapse and pronatory influence noted bilateral   Xrays  Left & Right Foot 3 views    Impression: Normal osseous mineralization, joint spaces well preserved, pes planus foot type with first ray elevatus, fifth hammertoe deformity, no fractures or dislocations, soft tissues within normal limits, no foreign body.       Assessment  and Plan: Problem List Items Addressed This Visit    None    Visit Diagnoses    Foot pain, unspecified laterality    -  Primary    Relevant Medications    meloxicam  (MOBIC) 15 MG tablet    Other Relevant Orders    DG Foot Complete Left    DG Foot Complete Right    Achilles tendonitis, bilateral        Relevant Medications    meloxicam (MOBIC) 15 MG tablet    Equinovarus acquired deformity, unspecified laterality        Pes planus of both feet        Tinea pedis of both feet        Relevant Medications    terbinafine (LAMISIL AT) 1 % cream        -Complete examination performed -Xrays reviewed -Discussed treatement options for tinea pedis and likely structural related tendonitis -Recommend daily use of good supportive shoes and over-the-counter orthotics -Recommend daily icing -Recommend gentle stretching exercises; information handout given -Recommend daily use of night splint which was dispensed at today's visit -Rx meloxicam 15 mg daily -Rx Lamisil AT cream for tinea -Patient to return to office in 3 weeks or sooner if condition worsens.  Asencion Islam, DPM

## 2015-05-24 ENCOUNTER — Encounter: Payer: Self-pay | Admitting: Emergency Medicine

## 2015-05-24 ENCOUNTER — Emergency Department
Admission: EM | Admit: 2015-05-24 | Discharge: 2015-05-24 | Disposition: A | Discharge status: Home / Self Care | Payer: Medicaid Other | Attending: Emergency Medicine | Admitting: Emergency Medicine

## 2015-05-24 DIAGNOSIS — M7072 Other bursitis of hip, left hip: Secondary | ICD-10-CM

## 2015-05-24 DIAGNOSIS — Y9389 Activity, other specified: Secondary | ICD-10-CM | POA: Insufficient documentation

## 2015-05-24 DIAGNOSIS — Z791 Long term (current) use of non-steroidal anti-inflammatories (NSAID): Secondary | ICD-10-CM | POA: Diagnosis not present

## 2015-05-24 DIAGNOSIS — E119 Type 2 diabetes mellitus without complications: Secondary | ICD-10-CM | POA: Insufficient documentation

## 2015-05-24 DIAGNOSIS — Z7984 Long term (current) use of oral hypoglycemic drugs: Secondary | ICD-10-CM | POA: Insufficient documentation

## 2015-05-24 DIAGNOSIS — M7062 Trochanteric bursitis, left hip: Secondary | ICD-10-CM | POA: Insufficient documentation

## 2015-05-24 DIAGNOSIS — Z79899 Other long term (current) drug therapy: Secondary | ICD-10-CM | POA: Diagnosis not present

## 2015-05-24 DIAGNOSIS — M25552 Pain in left hip: Secondary | ICD-10-CM | POA: Diagnosis present

## 2015-05-24 DIAGNOSIS — M76892 Other specified enthesopathies of left lower limb, excluding foot: Secondary | ICD-10-CM | POA: Diagnosis not present

## 2015-05-24 MED ORDER — PREDNISONE 10 MG PO TABS: 10.0000 mg | ORAL_TABLET | Freq: Two times a day (BID) | ORAL | Status: DC

## 2015-05-24 MED ORDER — CYCLOBENZAPRINE HCL 5 MG PO TABS: 5.0000 mg | ORAL_TABLET | Freq: Three times a day (TID) | ORAL | Status: DC | PRN

## 2015-05-24 NOTE — ED Provider Notes (Signed)
[facility] Emergency Department Provider Note ____________________________________________  Time seen: 1510  I have reviewed the triage vital signs and the nursing notes.  HISTORY  Chief Complaint  Leg Pain and Back Pain  HPI  Penny Gonzalez is a 19 y.o. female reports to the ED for evaluation of left hip and thigh pain has been intermittent but increasing over the last month or so, since she returned to full time work. She is about 10 months postpartum, and describes the onset of some pain to the lateral side of her left hip that seemed to begin around the time she delivered. She describes the symptoms as intermittent, achy, and spasms. She also notes that it sometimes feels as if her leg is going to "give out" on her. This pain usually passes quickly and seemed to be worsened by prolonged walking and transition from sit to stand. He has dosed Tylenol and Motrin intermittently but denies any overall symptom relief. She recently returned to work and notes the increased incidence of these catches or sharp pains in her lateral hip with her excessive walking on the job. She rates discomfort at 9/10 in triage. She denies any lotion any paresthesias, foot drop, or any recent falls, trauma, or injury.  Past Medical History  Diagnosis Date  . Diabetes mellitus without complication (HCC)   . Irregular periods/menstrual cycles   . Joint pain of leg   . Depression     patient has not been officially diagnosed but has symptoms  . Anemia, iron deficiency, inadequate dietary intake 12/01/2014    Patient Active Problem List   Diagnosis Date Noted  . Cholelithiasis 03/10/2015  . Cholelithiasis without obstruction 03/10/2015  . Constipation, slow transit 02/18/2015  . Chronic right upper quadrant pain 02/18/2015  . Tonsil stone 02/18/2015  . Anemia, iron deficiency, inadequate dietary intake 12/01/2014  . Diabetes mellitus type 2, controlled (HCC) 11/26/2014  . Headache  disorder 11/26/2014  . Arthralgia of multiple joints 11/26/2014  . Obesity, Class II, BMI 35-39.9 11/26/2014  . Contact with and suspected exposure to infections with predominantly sexual mode of transmission 11/26/2014  . Missed period 11/26/2014  . Diabetes in pregnancy (HCC) 06/24/2014    No past surgical history on file.  Current Outpatient Rx  Name  Route  Sig  Dispense  Refill  . cyclobenzaprine (FLEXERIL) 5 MG tablet   Oral   Take 1 tablet (5 mg total) by mouth 3 (three) times daily as needed for muscle spasms.   15 tablet   0   . docusate sodium (COLACE) 100 MG capsule   Oral   Take 1 capsule (100 mg total) by mouth 2 (two) times daily.   60 capsule   3   . ferrous sulfate 325 (65 FE) MG tablet   Oral   Take 1 tablet (325 mg total) by mouth daily with breakfast.   90 tablet   2   . meloxicam (MOBIC) 15 MG tablet   Oral   Take 1 tablet (15 mg total) by mouth daily.   30 tablet   0   . metFORMIN (GLUCOPHAGE) 500 MG tablet   Oral   Take 1 tablet (500 mg total) by mouth daily.   30 tablet   3   . naproxen (NAPROSYN) 500 MG tablet   Oral   Take 1 tablet (500 mg total) by mouth 2 (two) times daily with a meal.   60 tablet   2   . norethindrone-ethinyl estradiol (MICROGESTIN,JUNEL,LOESTRIN) 1-20  MG-MCG tablet   Oral   Take 1 tablet by mouth daily.         . predniSONE (DELTASONE) 10 MG tablet   Oral   Take 1 tablet (10 mg total) by mouth 2 (two) times daily with a meal.   10 tablet   0   . terbinafine (LAMISIL AT) 1 % cream   Topical   Apply 1 application topically 2 (two) times daily.   30 g   0    Allergies Review of patient's allergies indicates no known allergies.  Family History  Problem Relation Age of Onset  . Diabetes Mother   . Arthritis Father   . Diabetes Father   . Heart disease Father   . Hypertension Father   . Stroke Father   . Hearing loss Sister   . Hypertension Brother   . Hypertension Paternal Grandfather      Social History Social History  Substance Use Topics  . Smoking status: Never Smoker   . Smokeless tobacco: None  . Alcohol Use: No   Review of Systems  Constitutional: Negative for fever. Eyes: Negative for visual changes. ENT: Negative for sore throat. Cardiovascular: Negative for chest pain. Respiratory: Negative for shortness of breath. Gastrointestinal: Negative for abdominal pain, vomiting and diarrhea. Genitourinary: Negative for dysuria. Musculoskeletal: Negative for back pain.  Skin: Negative for rash. Neurological: Negative for headaches, focal weakness or numbness. ____________________________________________  PHYSICAL EXAM:  VITAL SIGNS: ED Triage Vitals  Enc Vitals Group     BP 05/24/15 1448 141/91 mmHg     Pulse Rate 05/24/15 1448 89     Resp 05/24/15 1448 16     Temp 05/24/15 1448 97.7 F (36.5 C)     Temp Source 05/24/15 1448 Oral     SpO2 05/24/15 1448 95 %     Weight 05/24/15 1448 230 lb (104.327 kg)     Height --      Head Cir --      Peak Flow --      Pain Score 05/24/15 1449 9     Pain Loc --      Pain Edu? --      Excl. in GC? --    Constitutional: Alert and oriented. Well appearing and in no distress. Head: Normocephalic and atraumatic.      Eyes: Conjunctivae are normal. PERRL. Normal extraocular movements      Ears: Canals clear. TMs intact bilaterally.   Nose: No congestion/rhinorrhea.   Mouth/Throat: Mucous membranes are moist.   Neck: Supple. No thyromegaly. Hematological/Lymphatic/Immunological: No cervical lymphadenopathy. Cardiovascular: Normal rate, regular rhythm.  Respiratory: Normal respiratory effort. No wheezes/rales/rhonchi. Gastrointestinal: Soft and nontender. No distention. Musculoskeletal: Patient with normal transition from sit to stand. She is able to exhibit normal lumbar flexion and extension range without midline tenderness, spasm, deformity, or step-off. She is tender to palpation over the left  trochanteric bursa. She shows full hip flexion and extension range without difficulty. Nontender with normal range of motion in all extremities.  Neurologic:  Normal gait without ataxia. Normal speech and language. No gross focal neurologic deficits are appreciated. Skin:  Skin is warm, dry and intact. No rash noted. Psychiatric: Mood and affect are normal. Patient exhibits appropriate insight and judgment. ____________________________________________  INITIAL IMPRESSION / ASSESSMENT AND PLAN / ED COURSE  Patient with presentation consistent with a left hip bursitis. She'll be discharged with prescriptions for enteric-coated Naprosyn and Flexeril. She will take a monitor symptoms and apply ice as directed. She  will follow-up with her primary care provider or orthopedics for consideration of a bursa steroid injection. Work note is provided today as requested. ____________________________________________  FINAL CLINICAL IMPRESSION(S) / ED DIAGNOSES  Final diagnoses:  Hip bursitis, left  Hip flexor tendinitis, left      Lissa HoardJenise V Bacon Demoni Gergen, PA-C 05/24/15 2010  Emily FilbertJonathan E Williams, MD 05/24/15 2049

## 2015-05-24 NOTE — Discharge Instructions (Signed)
Hip Bursitis Bursitis is a swelling and soreness (inflammation) of a fluid-filled sac (bursa). This sac overlies and protects the joints.  CAUSES   Injury.  Overuse of the muscles surrounding the joint.  Arthritis.  Gout.  Infection.  Cold weather.  Inadequate warm-up and conditioning prior to activities. The cause may not be known.  SYMPTOMS   Mild to severe irritation.  Tenderness and swelling over the outside of the hip.  Pain with motion of the hip.  If the bursa becomes infected, a fever may be present. Redness, tenderness, and warmth will develop over the hip. Symptoms usually lessen in 3 to 4 weeks with treatment, but can come back. TREATMENT If conservative treatment does not work, your caregiver may advise draining the bursa and injecting cortisone into the area. This may speed up the healing process. This may also be used as an initial treatment of choice. HOME CARE INSTRUCTIONS   Apply ice to the affected area for 15-20 minutes every 3 to 4 hours while awake for the first 2 days. Put the ice in a plastic bag and place a towel between the bag of ice and your skin.  Rest the painful joint as much as possible, but continue to put the joint through a normal range of motion at least 4 times per day. When the pain lessens, begin normal, slow movements and usual activities to help prevent stiffness of the hip.  Only take over-the-counter or prescription medicines for pain, discomfort, or fever as directed by your caregiver.  Use crutches to limit weight bearing on the hip joint, if advised.  Elevate your painful hip to reduce swelling. Use pillows for propping and cushioning your legs and hips.  Gentle massage may provide comfort and decrease swelling. SEEK IMMEDIATE MEDICAL CARE IF:   Your pain increases even during treatment, or you are not improving.  You have a fever.  You have heat and inflammation over the involved bursa.  You have any other questions or  concerns. MAKE SURE YOU:   Understand these instructions.  Will watch your condition.  Will get help right away if you are not doing well or get worse.   This information is not intended to replace advice given to you by your health care provider. Make sure you discuss any questions you have with your health care provider.   Document Released: 11/19/2001 Document Revised: 08/22/2011 Document Reviewed: 12/30/2014 Elsevier Interactive Patient Education [employer]2016 Elsevier Inc.   Take the prescription meds as directed.  Follow-up with your provider or ortho for further management. Hold any other anti-inflammatories while dosing the steroids.

## 2015-05-24 NOTE — ED Notes (Signed)
Pt here for back pain and left leg pain. Pain is described as a "spasm"

## 2015-05-24 NOTE — ED Notes (Signed)
NAD noted at time of D/C. Pt denies questions or concerns. Pt ambulatory to the lobby at this time.  

## 2015-06-12 ENCOUNTER — Ambulatory Visit: Payer: Medicaid Other | Admitting: Sports Medicine

## 2015-06-16 ENCOUNTER — Ambulatory Visit (INDEPENDENT_AMBULATORY_CARE_PROVIDER_SITE_OTHER): Payer: Medicaid Other | Admitting: Sports Medicine

## 2015-06-16 ENCOUNTER — Encounter: Payer: Self-pay | Admitting: Sports Medicine

## 2015-06-16 DIAGNOSIS — M21869 Other specified acquired deformities of unspecified lower leg: Secondary | ICD-10-CM

## 2015-06-16 DIAGNOSIS — M79673 Pain in unspecified foot: Secondary | ICD-10-CM

## 2015-06-16 DIAGNOSIS — M7661 Achilles tendinitis, right leg: Secondary | ICD-10-CM | POA: Diagnosis not present

## 2015-06-16 DIAGNOSIS — E119 Type 2 diabetes mellitus without complications: Secondary | ICD-10-CM | POA: Diagnosis not present

## 2015-06-16 DIAGNOSIS — M216X9 Other acquired deformities of unspecified foot: Secondary | ICD-10-CM | POA: Diagnosis not present

## 2015-06-16 DIAGNOSIS — M2141 Flat foot [pes planus] (acquired), right foot: Secondary | ICD-10-CM

## 2015-06-16 DIAGNOSIS — B353 Tinea pedis: Secondary | ICD-10-CM | POA: Diagnosis not present

## 2015-06-16 DIAGNOSIS — M7662 Achilles tendinitis, left leg: Secondary | ICD-10-CM

## 2015-06-16 DIAGNOSIS — M2142 Flat foot [pes planus] (acquired), left foot: Secondary | ICD-10-CM

## 2015-06-16 DIAGNOSIS — M62469 Contracture of muscle, unspecified lower leg: Secondary | ICD-10-CM

## 2015-06-16 MED ORDER — TERBINAFINE HCL 1 % EX CREA: 1.0000 "application " | TOPICAL_CREAM | Freq: Two times a day (BID) | CUTANEOUS | Status: DC

## 2015-06-16 NOTE — Progress Notes (Signed)
Patient ID: Penny Gonzalez, female   DOB: 02-Mar-1996, 20 y.o.   MRN: [id number] Subjective: Penny Gonzalez is a 20 y.o. diabetic female patient who presents to office for evaluation of bilateral foot pain. Patient states that pain feels a little better at times however she has been forgetting to wear brace and take meds. States that she has no more problems in the right and has ocassional pain in the left. Patient states that she never got the cream for her feet. Admits also to ocassional tingling in toes unchanged from prior.  Patient denies any other pedal complaints.   FBS 123mg /dl a few days ago  W0J 6.7  Patient Active Problem List   Diagnosis Date Noted  . Cholelithiasis 03/10/2015  . Cholelithiasis without obstruction 03/10/2015  . Constipation, slow transit 02/18/2015  . Chronic right upper quadrant pain 02/18/2015  . Tonsil stone 02/18/2015  . Anemia, iron deficiency, inadequate dietary intake 12/01/2014  . Diabetes mellitus type 2, controlled (HCC) 11/26/2014  . Headache disorder 11/26/2014  . Arthralgia of multiple joints 11/26/2014  . Obesity, Class II, BMI 35-39.9 11/26/2014  . Contact with and suspected exposure to infections with predominantly sexual mode of transmission 11/26/2014  . Missed period 11/26/2014  . Diabetes in pregnancy (HCC) 06/24/2014   Current Outpatient Prescriptions on File Prior to Visit  Medication Sig Dispense Refill  . cyclobenzaprine (FLEXERIL) 5 MG tablet Take 1 tablet (5 mg total) by mouth 3 (three) times daily as needed for muscle spasms. 15 tablet 0  . docusate sodium (COLACE) 100 MG capsule Take 1 capsule (100 mg total) by mouth 2 (two) times daily. 60 capsule 3  . ferrous sulfate 325 (65 FE) MG tablet Take 1 tablet (325 mg total) by mouth daily with breakfast. 90 tablet 2  . meloxicam (MOBIC) 15 MG tablet Take 1 tablet (15 mg total) by mouth daily. 30 tablet 0  . metFORMIN (GLUCOPHAGE) 500 MG tablet Take 1 tablet (500 mg total) by mouth  daily. 30 tablet 3  . naproxen (NAPROSYN) 500 MG tablet Take 1 tablet (500 mg total) by mouth 2 (two) times daily with a meal. 60 tablet 2  . norethindrone-ethinyl estradiol (MICROGESTIN,JUNEL,LOESTRIN) 1-20 MG-MCG tablet Take 1 tablet by mouth daily.    . predniSONE (DELTASONE) 10 MG tablet Take 1 tablet (10 mg total) by mouth 2 (two) times daily with a meal. 10 tablet 0   No current facility-administered medications on file prior to visit.   No Known Allergies   Objective:  General: Alert and oriented x3 in no acute distress  Dermatology: No open lesions bilateral lower extremities, no webspace macerations, no ecchymosis bilateral, mild scaly skin plantar sulcus bilateral feet resembling tinea with no signs of infection, all nails x 10 are well manicured.  Vascular: Dorsalis Pedis and Posterior Tibial pedal pulses 2/4, Capillary Fill Time 3 seconds,(+) pedal hair growth bilateral, no edema bilateral lower extremities, Temperature gradient within normal limits.  Neurology: Gross sensation intact via light touch bilateral, Protective sensation intact  with Semmes Weinstein Monofilament to all pedal sites, Position sense intact, vibratory intact bilateral, Deep tendon reflexes within normal limits bilateral, No babinski sign present bilateral. (- )Tinels sign.  Musculoskeletal: Decreased tenderness with palpation at achilles tendon insertion no other areas of pedal pain reproduced on exam bilateral,No pain with calf compression bilateral. There is decreased ankle rom with knee extending  vs flexed resembling gastroc equnius bilateral, Subtalar joint range of motion is within normal limits, there is no 1st ray hypermobility  noted bilateral, Pes planus foot type bilateral, strength within normal limits in all groups bilateral.        Assessment and Plan: Problem List Items Addressed This Visit    None    Visit Diagnoses    Achilles tendonitis, bilateral    -  Primary    improving L>R     Gastrocnemius equinus, unspecified laterality        Pes planus of both feet        Tinea pedis of both feet        Relevant Medications    terbinafine (LAMISIL AT) 1 % cream    Foot pain, unspecified laterality        Diabetes mellitus without complication (HCC)           -Complete examination performed -Discussed treatement options for tinea pedis and likely structural related tendonitis -Recommend daily use of good supportive shoes and over-the-counter orthotics. Dispensed heel lifts to use daily at today's visit. -Recommend daily icing 1-2x daily  -Recommend gentle stretching exercises daily -Recommend daily use of night splint daily -Advised patient to complete the course of meloxicam as rx -Reordered Lamisil AT cream for tinea -Patient to return to office in 4 weeks or sooner if condition worsens.  Penny Gonzalez, DPM

## 2015-07-14 ENCOUNTER — Encounter: Payer: Self-pay | Admitting: Sports Medicine

## 2015-07-14 ENCOUNTER — Ambulatory Visit (INDEPENDENT_AMBULATORY_CARE_PROVIDER_SITE_OTHER): Payer: Medicaid Other | Admitting: Sports Medicine

## 2015-07-14 DIAGNOSIS — M79673 Pain in unspecified foot: Secondary | ICD-10-CM

## 2015-07-14 DIAGNOSIS — M7662 Achilles tendinitis, left leg: Secondary | ICD-10-CM

## 2015-07-14 DIAGNOSIS — E119 Type 2 diabetes mellitus without complications: Secondary | ICD-10-CM | POA: Diagnosis not present

## 2015-07-14 DIAGNOSIS — M21869 Other specified acquired deformities of unspecified lower leg: Secondary | ICD-10-CM

## 2015-07-14 DIAGNOSIS — M216X9 Other acquired deformities of unspecified foot: Secondary | ICD-10-CM | POA: Diagnosis not present

## 2015-07-14 DIAGNOSIS — M2142 Flat foot [pes planus] (acquired), left foot: Secondary | ICD-10-CM

## 2015-07-14 DIAGNOSIS — M7661 Achilles tendinitis, right leg: Secondary | ICD-10-CM

## 2015-07-14 DIAGNOSIS — M2141 Flat foot [pes planus] (acquired), right foot: Secondary | ICD-10-CM

## 2015-07-14 DIAGNOSIS — B353 Tinea pedis: Secondary | ICD-10-CM

## 2015-07-14 NOTE — Progress Notes (Signed)
Patient ID: Penny Gonzalez, female   DOB: 05-Aug-1995, 20 y.o.   MRN: [id number]  Subjective: Penny Gonzalez is a 20 y.o. diabetic female patient who returms to office for evaluation of bilateral foot pain. Patient states that pain comes and goes; feels "ok". Patient completed the mobic with no issues; states that she never got the cream for her feet and will attempt to get it. Admits that she saw her PCP who gave her a list of other doctors that she can consider visiting for pain in legs. Admits also to ocassional tingling in toes unchanged from prior.  Patient denies any other pedal complaints.   FBS /dl a few days ago  W0J 6.7  Patient Active Problem List   Diagnosis Date Noted  . Cholelithiasis 03/10/2015  . Cholelithiasis without obstruction 03/10/2015  . Constipation, slow transit 02/18/2015  . Chronic right upper quadrant pain 02/18/2015  . Tonsil stone 02/18/2015  . Anemia, iron deficiency, inadequate dietary intake 12/01/2014  . Diabetes mellitus type 2, controlled (HCC) 11/26/2014  . Headache disorder 11/26/2014  . Arthralgia of multiple joints 11/26/2014  . Obesity, Class II, BMI 35-39.9 11/26/2014  . Contact with and suspected exposure to infections with predominantly sexual mode of transmission 11/26/2014  . Missed period 11/26/2014  . Diabetes in pregnancy (HCC) 06/24/2014   Current Outpatient Prescriptions on File Prior to Visit  Medication Sig Dispense Refill  . cyclobenzaprine (FLEXERIL) 5 MG tablet Take 1 tablet (5 mg total) by mouth 3 (three) times daily as needed for muscle spasms. 15 tablet 0  . docusate sodium (COLACE) 100 MG capsule Take 1 capsule (100 mg total) by mouth 2 (two) times daily. 60 capsule 3  . ferrous sulfate 325 (65 FE) MG tablet Take 1 tablet (325 mg total) by mouth daily with breakfast. 90 tablet 2  . meloxicam (MOBIC) 15 MG tablet Take 1 tablet (15 mg total) by mouth daily. 30 tablet 0  . metFORMIN (GLUCOPHAGE) 500 MG tablet Take 1  tablet (500 mg total) by mouth daily. 30 tablet 3  . naproxen (NAPROSYN) 500 MG tablet Take 1 tablet (500 mg total) by mouth 2 (two) times daily with a meal. 60 tablet 2  . norethindrone-ethinyl estradiol (MICROGESTIN,JUNEL,LOESTRIN) 1-20 MG-MCG tablet Take 1 tablet by mouth daily.    . predniSONE (DELTASONE) 10 MG tablet Take 1 tablet (10 mg total) by mouth 2 (two) times daily with a meal. 10 tablet 0  . terbinafine (LAMISIL AT) 1 % cream Apply 1 application topically 2 (two) times daily. 30 g 0   No current facility-administered medications on file prior to visit.   No Known Allergies   Objective:  General: Alert and oriented x3 in no acute distress  Dermatology: No open lesions bilateral lower extremities, no webspace macerations, no ecchymosis bilateral, mild scaly skin plantar sulcus bilateral feet resembling tinea with no signs of infection, all nails x 10 are well manicured.  Vascular: Dorsalis Pedis and Posterior Tibial pedal pulses 2/4, Capillary Fill Time 3 seconds,(+) pedal hair growth bilateral, no edema bilateral lower extremities, Temperature gradient within normal limits.  Neurology: Gross sensation intact via light touch bilateral, Protective sensation intact  with Semmes Weinstein Monofilament to all pedal sites, Position sense intact, vibratory intact bilateral, Deep tendon reflexes within normal limits bilateral, No babinski sign present bilateral. (- )Tinels sign.  Musculoskeletal: No tenderness with palpation at achilles tendon insertion no other areas of pedal pain reproduced on exam bilateral,No pain with calf compression bilateral. There is decreased ankle  rom with knee extending  vs flexed resembling gastroc equnius bilateral, Subtalar joint range of motion is within normal limits, there is no 1st ray hypermobility noted bilateral, Pes planus foot type bilateral, strength within normal limits in all groups bilateral.        Assessment and Plan: Problem List Items  Addressed This Visit    None    Visit Diagnoses    Achilles tendonitis, bilateral    -  Primary    Gastrocnemius equinus, unspecified laterality        Pes planus of both feet        Tinea pedis of both feet        Foot pain, unspecified laterality        Diabetes mellitus without complication (HCC)           -Complete examination performed -Discussed treatement options for tinea pedis and likely structural related tendonitis secondary to pes planus -Recommend daily use of good supportive shoes and over-the-counter orthotics. Cont with heel lifts daily. -Recommend daily icing 1-2x daily  -Recommend gentle stretching exercises daily -Recommend daily use of night splint daily -Advised patient to get Lamisil AT cream for tinea  -Advised patient to see other consults as provided by PCP for ocassional nerve pain; recommend neurology -Patient to return to office as needed or sooner if condition worsens.  Asencion Islam, DPM

## 2015-09-03 ENCOUNTER — Emergency Department
Admission: EM | Admit: 2015-09-03 | Discharge: 2015-09-03 | Disposition: A | Discharge status: Home / Self Care | Payer: Medicaid Other | Attending: Emergency Medicine | Admitting: Emergency Medicine

## 2015-09-03 DIAGNOSIS — K649 Unspecified hemorrhoids: Secondary | ICD-10-CM | POA: Insufficient documentation

## 2015-09-03 DIAGNOSIS — E119 Type 2 diabetes mellitus without complications: Secondary | ICD-10-CM | POA: Diagnosis not present

## 2015-09-03 LAB — URINALYSIS COMPLETE WITH MICROSCOPIC (ARMC ONLY)
BILIRUBIN URINE: NEGATIVE
GLUCOSE, UA: NEGATIVE mg/dL
HGB URINE DIPSTICK: NEGATIVE
KETONES UR: NEGATIVE mg/dL
Nitrite: NEGATIVE
Protein, ur: 30 mg/dL — AB
Specific Gravity, Urine: 1.025 (ref 1.005–1.030)
pH: 6 (ref 5.0–8.0)

## 2015-09-03 LAB — POCT PREGNANCY, URINE: Preg Test, Ur: NEGATIVE

## 2015-09-03 NOTE — ED Notes (Signed)
Pt states she has passed blood with her stools the past 2 days.. States she has hx of hemorrhoids. Pt also request STD and pregnancy test..

## 2015-09-15 ENCOUNTER — Emergency Department
Admission: EM | Admit: 2015-09-15 | Discharge: 2015-09-15 | Disposition: A | Discharge status: Home / Self Care | Payer: Medicaid Other | Attending: Emergency Medicine | Admitting: Emergency Medicine

## 2015-09-15 DIAGNOSIS — F329 Major depressive disorder, single episode, unspecified: Secondary | ICD-10-CM | POA: Diagnosis not present

## 2015-09-15 DIAGNOSIS — K625 Hemorrhage of anus and rectum: Secondary | ICD-10-CM | POA: Diagnosis present

## 2015-09-15 DIAGNOSIS — E119 Type 2 diabetes mellitus without complications: Secondary | ICD-10-CM | POA: Insufficient documentation

## 2015-09-15 DIAGNOSIS — K649 Unspecified hemorrhoids: Secondary | ICD-10-CM | POA: Insufficient documentation

## 2015-09-15 LAB — COMPREHENSIVE METABOLIC PANEL
ALT: 37 U/L (ref 14–54)
ANION GAP: 7 (ref 5–15)
AST: 36 U/L (ref 15–41)
Albumin: 4.5 g/dL (ref 3.5–5.0)
Alkaline Phosphatase: 63 U/L (ref 38–126)
BILIRUBIN TOTAL: 0.5 mg/dL (ref 0.3–1.2)
BUN: 9 mg/dL (ref 6–20)
CALCIUM: 9.7 mg/dL (ref 8.9–10.3)
CO2: 24 mmol/L (ref 22–32)
Chloride: 105 mmol/L (ref 101–111)
Creatinine, Ser: 0.52 mg/dL (ref 0.44–1.00)
GFR calc Af Amer: >60 mL/min (ref >60)
Glucose, Bld: 106 mg/dL — ABNORMAL HIGH (ref 65–99)
POTASSIUM: 3.6 mmol/L (ref 3.5–5.1)
Sodium: 136 mmol/L (ref 135–145)
TOTAL PROTEIN: 7.9 g/dL (ref 6.5–8.1)

## 2015-09-15 LAB — URINALYSIS COMPLETE WITH MICROSCOPIC (ARMC ONLY)
Bilirubin Urine: NEGATIVE
GLUCOSE, UA: NEGATIVE mg/dL
HGB URINE DIPSTICK: NEGATIVE
KETONES UR: NEGATIVE mg/dL
NITRITE: NEGATIVE
Protein, ur: 30 mg/dL — AB
SPECIFIC GRAVITY, URINE: 1.019 (ref 1.005–1.030)
pH: 8 (ref 5.0–8.0)

## 2015-09-15 LAB — CBC
HEMATOCRIT: 40.9 % (ref 35.0–47.0)
HEMOGLOBIN: 13.5 g/dL (ref 12.0–16.0)
MCH: 26.9 pg (ref 26.0–34.0)
MCHC: 32.9 g/dL (ref 32.0–36.0)
MCV: 81.6 fL (ref 80.0–100.0)
Platelets: 237 10*3/uL (ref 150–440)
RBC: 5.01 MIL/uL (ref 3.80–5.20)
RDW: 14.6 % — AB (ref 11.5–14.5)
WBC: 6.5 10*3/uL (ref 3.6–11.0)

## 2015-09-15 LAB — LIPASE, BLOOD: LIPASE: 17 U/L (ref 11–51)

## 2015-09-15 LAB — POCT PREGNANCY, URINE: PREG TEST UR: NEGATIVE

## 2015-09-15 NOTE — ED Provider Notes (Signed)
[facility] Emergency Department Provider Note  ____________________________________________    I have reviewed the triage vital signs and the nursing notes.   HISTORY  Chief Complaint Abdominal Pain and Rectal Bleeding    HPI Penny Gonzalez is a 20 y.o. female who presents with complaints of rectal bleeding. Patient reports that she looked at her stool yesterday and noticed a small amount of red blood in the toilet and on the paper. She denies painful stooling. She does report history of constipation. She does have a history of hemorrhoids. No nausea or vomiting. No dizziness. No abdominal pain today.     Past Medical History  Diagnosis Date  . Diabetes mellitus without complication (HCC)   . Irregular periods/menstrual cycles   . Joint pain of leg   . Depression     patient has not been officially diagnosed but has symptoms  . Anemia, iron deficiency, inadequate dietary intake 12/01/2014    Patient Active Problem List   Diagnosis Date Noted  . Cholelithiasis 03/10/2015  . Cholelithiasis without obstruction 03/10/2015  . Constipation, slow transit 02/18/2015  . Chronic right upper quadrant pain 02/18/2015  . Tonsil stone 02/18/2015  . Anemia, iron deficiency, inadequate dietary intake 12/01/2014  . Diabetes mellitus type 2, controlled (HCC) 11/26/2014  . Headache disorder 11/26/2014  . Arthralgia of multiple joints 11/26/2014  . Obesity, Class II, BMI 35-39.9 11/26/2014  . Contact with and suspected exposure to infections with predominantly sexual mode of transmission 11/26/2014  . Missed period 11/26/2014  . Diabetes in pregnancy (HCC) 06/24/2014    No past surgical history on file.  Current Outpatient Rx  Name  Route  Sig  Dispense  Refill  . cyclobenzaprine (FLEXERIL) 5 MG tablet   Oral   Take 1 tablet (5 mg total) by mouth 3 (three) times daily as needed for muscle spasms.   15 tablet   0   . docusate sodium (COLACE) 100 MG  capsule   Oral   Take 1 capsule (100 mg total) by mouth 2 (two) times daily.   60 capsule   3   . ferrous sulfate 325 (65 FE) MG tablet   Oral   Take 1 tablet (325 mg total) by mouth daily with breakfast.   90 tablet   2   . meloxicam (MOBIC) 15 MG tablet   Oral   Take 1 tablet (15 mg total) by mouth daily.   30 tablet   0   . metFORMIN (GLUCOPHAGE) 500 MG tablet   Oral   Take 1 tablet (500 mg total) by mouth daily.   30 tablet   3   . naproxen (NAPROSYN) 500 MG tablet   Oral   Take 1 tablet (500 mg total) by mouth 2 (two) times daily with a meal.   60 tablet   2   . norethindrone-ethinyl estradiol (MICROGESTIN,JUNEL,LOESTRIN) 1-20 MG-MCG tablet   Oral   Take 1 tablet by mouth daily.         . predniSONE (DELTASONE) 10 MG tablet   Oral   Take 1 tablet (10 mg total) by mouth 2 (two) times daily with a meal.   10 tablet   0   . terbinafine (LAMISIL AT) 1 % cream   Topical   Apply 1 application topically 2 (two) times daily.   30 g   0     Allergies Review of patient's allergies indicates no known allergies.  Family History  Problem Relation Age of Onset  .  Diabetes Mother   . Arthritis Father   . Diabetes Father   . Heart disease Father   . Hypertension Father   . Stroke Father   . Hearing loss Sister   . Hypertension Brother   . Hypertension Paternal Grandfather     Social History Social History  Substance Use Topics  . Smoking status: Never Smoker   . Smokeless tobacco: Not on file  . Alcohol Use: No    Review of Systems  Constitutional: Negative for dizziness Eyes: Negative for redness  Cardiovascular: Negative for palpitations Respiratory: Negative for cough Gastrointestinal: Negative for abdominal pain, rectal bleeding as above Genitourinary: Negative for dysuria. Musculoskeletal: Positive for chronic back pain Skin: Negative for pallor  Psychiatric: Mild anxiety    ____________________________________________   PHYSICAL  EXAM:  VITAL SIGNS: ED Triage Vitals  Enc Vitals Group     BP 09/15/15 0731 124/76 mmHg     Pulse Rate 09/15/15 0328 79     Resp 09/15/15 0328 20     Temp 09/15/15 0328 98.2 F (36.8 C)     Temp Source 09/15/15 0328 Oral     SpO2 09/15/15 0328 99 %     Weight 09/15/15 0328 230 lb (104.327 kg)     Height 09/15/15 0328 5\' 8"  (1.727 m)     Head Cir --      Peak Flow --      Pain Score 09/15/15 0328 8     Pain Loc --      Pain Edu? --      Excl. in GC? --      Constitutional: Alert and oriented. Well appearing and in no distress.  Eyes: Conjunctivae are normal. No erythema or injection ENT   Head: Normocephalic and atraumatic.   Mouth/Throat: Mucous membranes are moist. Cardiovascular: Normal rate, regular rhythm. N Respiratory: Normal respiratory effort without tachypnea nor retractions.  Gastrointestinal: Soft and non-tender in all quadrants. No distention. Nonbleeding, nonthrombosed hemorrhoid at 12 and 6:00, brown stool Genitourinary: deferred Musculoskeletal: Nontender with normal range of motion in all extremities.  Neurologic:  Normal speech and language. No gross focal neurologic deficits are appreciated. Skin:  Skin is warm, dry and intact. No rash noted. Psychiatric: Mood and affect are normal. Patient exhibits appropriate insight and judgment.  ____________________________________________    LABS (pertinent positives/negatives)  Labs Reviewed  COMPREHENSIVE METABOLIC PANEL - Abnormal; Notable for the following:    Glucose, Bld 106 (*)    All other components within normal limits  CBC - Abnormal; Notable for the following:    RDW 14.6 (*)    All other components within normal limits  URINALYSIS COMPLETEWITH MICROSCOPIC ([facility] ONLY) - Abnormal; Notable for the following:    Color, Urine YELLOW (*)    APPearance CLOUDY (*)    Protein, ur 30 (*)    Leukocytes, UA TRACE (*)    Bacteria, UA RARE (*)    Squamous Epithelial / LPF 6-30 (*)    All other  components within normal limits  LIPASE, BLOOD  POC URINE PREG, ED  POCT PREGNANCY, URINE    ____________________________________________   EKG  None  ____________________________________________    RADIOLOGY  None  ____________________________________________   PROCEDURES  Procedure(s) performed: none  Critical Care performed: none  ____________________________________________   INITIAL IMPRESSION / ASSESSMENT AND PLAN / ED COURSE  Pertinent labs & imaging results that were available during my care of the patient were reviewed by me and considered in my medical decision making (see  chart for details).  Patient well-appearing and in no distress. Vital signs are normal. Lab work is unremarkable. Exam consistent with hemorrhoids. Recommended conservative care, Preparation H, increase fiber and follow up with PCP.  ____________________________________________   FINAL CLINICAL IMPRESSION(S) / ED DIAGNOSES  Final diagnoses:  Hemorrhoids, unspecified hemorrhoid type          Jene Every, MD 09/15/15 [phone]

## 2015-09-15 NOTE — ED Notes (Signed)
Patient ambulatory to triage with steady gait, without difficulty or distress noted; pt reports blood noted in stool x week with lower abd & back pain;  here recently for same but left prior to being evaluated

## 2015-09-15 NOTE — Discharge Instructions (Signed)
Hemorrhoids °Hemorrhoids are puffy (swollen) veins around the rectum or anus. Hemorrhoids can cause pain, itching, bleeding, or irritation. °HOME CARE °· Eat foods with fiber, such as whole grains, beans, nuts, fruits, and vegetables. Ask your doctor about taking products with added fiber in them (fiber supplements).  °· Drink enough fluid to keep your pee (urine) clear or pale yellow. °· Exercise often. °· Go to the bathroom when you have the urge to poop. Do not wait. °· Avoid straining to poop (bowel movement). °· Keep the butt area dry and clean. Use wet toilet paper or moist paper towels. °· Medicated creams and medicine inserted into the anus (anal suppository) may be used or applied as told. °· Only take medicine as told by your doctor. °· Take a warm water bath (sitz bath) for 15-20 minutes to ease pain. Do this 3-4 times a day. °· Place ice packs on the area if it is tender or puffy. Use the ice packs between the warm water baths. °¨ Put ice in a plastic bag. °¨ Place a towel between your skin and the bag. °¨ Leave the ice on for 15-20 minutes, 03-04 times a day. °· Do not use a donut-shaped pillow or sit on the toilet for a long time. °GET HELP RIGHT AWAY IF:  °· You have more pain that is not controlled by treatment or medicine. °· You have bleeding that will not stop. °· You have trouble or are unable to poop (bowel movement). °· You have pain or puffiness outside the area of the hemorrhoids. °MAKE SURE YOU:  °· Understand these instructions. °· Will watch your condition. °· Will get help right away if you are not doing well or get worse. °  °This information is not intended to replace advice given to you by your health care provider. Make sure you discuss any questions you have with your health care provider. °  °Document Released: 03/08/2008 Document Revised: 05/16/2012 Document Reviewed: 04/10/2012 °Elsevier Interactive Patient Education ©2016 [employer] ° °High-Fiber Diet °Fiber, also called  dietary fiber, is a type of carbohydrate found in fruits, vegetables, whole grains, and beans. A high-fiber diet can have many health benefits. Your health care provider may recommend a high-fiber diet to help: °· Prevent constipation. Fiber can make your bowel movements more regular. °· Lower your cholesterol. °· Relieve hemorrhoids, uncomplicated diverticulosis, or irritable bowel syndrome. °· Prevent overeating as part of a weight-loss plan. °· Prevent heart disease, type 2 diabetes, and certain cancers. °WHAT IS MY PLAN? °The recommended daily intake of fiber includes: °· 38 grams for men under age 50. °· 30 grams for men over age 50. °· 25 grams for women under age 50. °· 21 grams for women over age 50. °You can get the recommended daily intake of dietary fiber by eating a variety of fruits, vegetables, grains, and beans. Your health care provider may also recommend a fiber supplement if it is not possible to get enough fiber through your diet. °WHAT DO I NEED TO KNOW ABOUT A HIGH-FIBER DIET? °· Fiber supplements have not been widely studied for their effectiveness, so it is better to get fiber through food sources. °· Always check the fiber content on the nutrition facts label of any prepackaged food. Look for foods that contain at least 5 grams of fiber per serving. °· Ask your dietitian if you have questions about specific foods that are related to your condition, especially if those foods are not listed in the following section. °· Increase your   daily fiber consumption gradually. Increasing your intake of dietary fiber too quickly may cause bloating, cramping, or gas. °· Drink plenty of water. Water helps you to digest fiber. °WHAT FOODS CAN I EAT? °Grains °Whole-grain breads. Multigrain cereal. Oats and oatmeal. Brown rice. Barley. Bulgur wheat. Millet. Bran muffins. Popcorn. Rye wafer crackers. °Vegetables °Sweet potatoes. Spinach. Kale. Artichokes. Cabbage. Broccoli. Green peas. Carrots.  Squash. °Fruits °Berries. Pears. Apples. Oranges. Avocados. Prunes and raisins. Dried figs. °Meats and Other Protein Sources °Navy, kidney, pinto, and soy beans. Split peas. Lentils. Nuts and seeds. °Dairy °Fiber-fortified yogurt. °Beverages °Fiber-fortified soy milk. Fiber-fortified orange juice. °Other °Fiber bars. °The items listed above may not be a complete list of recommended foods or beverages. Contact your dietitian for more options. °WHAT FOODS ARE NOT RECOMMENDED? °Grains °White bread. Pasta made with refined flour. White rice. °Vegetables °Fried potatoes. Canned vegetables. Well-cooked vegetables.  °Fruits °Fruit juice. Cooked, strained fruit. °Meats and Other Protein Sources °Fatty cuts of meat. Fried poultry or fried fish. °Dairy °Milk. Yogurt. Cream cheese. Sour cream. °Beverages °Soft drinks. °Other °Cakes and pastries. Butter and oils. °The items listed above may not be a complete list of foods and beverages to avoid. Contact your dietitian for more information. °WHAT ARE SOME TIPS FOR INCLUDING HIGH-FIBER FOODS IN MY DIET? °· Eat a wide variety of high-fiber foods. °· Make sure that half of all grains consumed each day are whole grains. °· Replace breads and cereals made from refined flour or white flour with whole-grain breads and cereals. °· Replace white rice with brown rice, bulgur wheat, or millet. °· Start the day with a breakfast that is high in fiber, such as a cereal that contains at least 5 grams of fiber per serving. °· Use beans in place of meat in soups, salads, or pasta. °· Eat high-fiber snacks, such as berries, raw vegetables, nuts, or popcorn. °  °This information is not intended to replace advice given to you by your health care provider. Make sure you discuss any questions you have with your health care provider. °  °Document Released: 05/30/2005 Document Revised: 06/20/2014 Document Reviewed: 11/12/2013 °Elsevier Interactive Patient Education ©2016 [employer] ° °

## 2015-09-15 NOTE — ED Notes (Signed)
Dr Cyril LoosenKinner at bedside, observed with this RN at bedside, pt has 2 hemorrhoids.

## 2015-10-27 ENCOUNTER — Encounter: Payer: Self-pay | Admitting: Family Medicine

## 2015-10-27 ENCOUNTER — Ambulatory Visit (INDEPENDENT_AMBULATORY_CARE_PROVIDER_SITE_OTHER): Payer: Medicaid Other | Admitting: Family Medicine

## 2015-10-27 VITALS — BP 124/84 | HR 97 | Temp 99.2°F | Resp 16 | Wt 235.0 lb

## 2015-10-27 DIAGNOSIS — Z5181 Encounter for therapeutic drug level monitoring: Secondary | ICD-10-CM

## 2015-10-27 DIAGNOSIS — E119 Type 2 diabetes mellitus without complications: Secondary | ICD-10-CM

## 2015-10-27 DIAGNOSIS — F331 Major depressive disorder, recurrent, moderate: Secondary | ICD-10-CM | POA: Diagnosis not present

## 2015-10-27 DIAGNOSIS — Z202 Contact with and (suspected) exposure to infections with a predominantly sexual mode of transmission: Secondary | ICD-10-CM

## 2015-10-27 DIAGNOSIS — Z0000 Encounter for general adult medical examination without abnormal findings: Secondary | ICD-10-CM | POA: Diagnosis not present

## 2015-10-27 LAB — POCT GLYCOSYLATED HEMOGLOBIN (HGB A1C): HEMOGLOBIN A1C: 7.4

## 2015-10-27 MED ORDER — METFORMIN HCL ER 500 MG PO TB24
500.0000 mg | ORAL_TABLET | Freq: Every day | ORAL | Status: DC
Start: 2015-10-27 — End: 2016-01-30

## 2015-10-27 NOTE — Progress Notes (Signed)
BP 124/84 mmHg  Pulse 97  Temp(Src) 99.2 F (37.3 C) (Oral)  Resp 16  Wt 235 lb (106.595 kg)  SpO2 95%   Subjective:    Patient ID: Penny Gonzalez, female    DOB: Apr 01, 1996, 20 y.o.   MRN: [id number]  HPI: Penny Gonzalez is a 20 y.o. female  Chief Complaint  Patient presents with  . Follow-up  . Medication Refill    metformin  . std check    not having any symptoms   Type 2 diabetes She has not been taking her medicine for a month Has not been home for a month; all is resolved today and she is safe Has had some dry mouth, always thirsty, using the bathroom a lot, no blurred vision since having glasses prescription changed Really strong fam hx of diabetes Did the diabetic educator She knows what to do, it's just a matter of doing it  Just got back on her birth control, had been off for a month, started back, body out of whack, saw her GYN, likely because of stress; no fevers; no abnormal pap smears; had gonorrhea; treated and had been sexually active since then; she would testing; some unprotected sex; would like full testing  She has a therapist; she has discussed the suicidal thoughts, self harm; just wished she wasn't here; then talked about her daughter; she would get depressed, back and forth; her therapist is helping her through it; goes to [facility]; she has crisis numbers   Depression screen [facility] 2/9 10/27/2015 02/18/2015 11/26/2014  Decreased Interest 2 0 1  Down, Depressed, Hopeless 2 0 1  PHQ - 2 Score 4 0 2  Altered sleeping 1 - 1  Tired, decreased energy 1 - 3  Change in appetite 3 - 0  Feeling bad or failure about yourself  2 - 1  Trouble concentrating 1 - 0  Moving slowly or fidgety/restless 0 - 0  Suicidal thoughts 1 - 0  PHQ-9 Score 13 - 7  Difficult doing work/chores Somewhat difficult - Somewhat difficult   Relevant past medical, surgical, family and social history reviewed Past Medical History  Diagnosis Date  . Diabetes mellitus without  complication (HCC)   . Irregular periods/menstrual cycles   . Joint pain of leg   . Depression     patient has not been officially diagnosed but has symptoms  . Anemia, iron deficiency, inadequate dietary intake 12/01/2014  . Major depression (HCC) 11/23/2015   No past surgical history on file. Family History  Problem Relation Age of Onset  . Diabetes Mother   . Arthritis Father   . Diabetes Father   . Heart disease Father   . Hypertension Father   . Stroke Father   . Hearing loss Sister   . Hypertension Brother   . Hypertension Paternal Grandfather    Social History  Substance Use Topics  . Smoking status: Never Smoker   . Smokeless tobacco: None  . Alcohol Use: No   Interim medical history since last visit reviewed. Allergies and medications reviewed  Review of Systems Per HPI unless specifically indicated above     Objective:    BP 124/84 mmHg  Pulse 97  Temp(Src) 99.2 F (37.3 C) (Oral)  Resp 16  Wt 235 lb (106.595 kg)  SpO2 95%  Wt Readings from Last 3 Encounters:  11/18/15 233 lb (105.688 kg)  10/27/15 235 lb (106.595 kg)  09/15/15 230 lb (104.327 kg)   body mass index is 35.74 kg/(m^2).  Physical Exam  Constitutional: She appears well-developed and well-nourished.  HENT:  Head: Normocephalic and atraumatic.  Eyes: Conjunctivae and EOM are normal. Right eye exhibits no hordeolum. Left eye exhibits no hordeolum. No scleral icterus.  Neck: Carotid bruit is not present. No thyromegaly present.  Cardiovascular: Normal rate, regular rhythm, S1 normal, S2 normal and normal heart sounds.   No extrasystoles are present.  Pulmonary/Chest: Effort normal and breath sounds normal. No respiratory distress. Right breast exhibits no inverted nipple, no mass, no nipple discharge, no skin change and no tenderness. Left breast exhibits no inverted nipple, no mass, no nipple discharge, no skin change and no tenderness. Breasts are symmetrical.  Abdominal: Soft. Normal  appearance and bowel sounds are normal. She exhibits no distension, no abdominal bruit, no pulsatile midline mass and no mass. There is no hepatosplenomegaly. There is no tenderness. No hernia.  Genitourinary: Uterus normal. Pelvic exam was performed with patient prone. There is no rash or lesion on the right labia. There is no rash or lesion on the left labia. Cervix exhibits no motion tenderness. Right adnexum displays no mass, no tenderness and no fullness. Left adnexum displays no mass, no tenderness and no fullness.  Musculoskeletal: Normal range of motion. She exhibits no edema.  Lymphadenopathy:       Head (right side): No submandibular adenopathy present.       Head (left side): No submandibular adenopathy present.    She has no cervical adenopathy.    She has no axillary adenopathy.  Neurological: She is alert. She displays no tremor. No cranial nerve deficit. She exhibits normal muscle tone. Gait normal.  Skin: Skin is warm and dry. No bruising and no ecchymosis noted. No cyanosis. No pallor.  Psychiatric: Her speech is normal and behavior is normal. Thought content normal. Her mood appears not anxious. She does not exhibit a depressed mood.   Diabetic Foot Form - Detailed   Diabetic Foot Exam - detailed  Diabetic Foot exam was performed with the following findings:  Yes 10/27/2015  2:34 PM  Visual Foot Exam completed.:  Yes  Are the toenails long?:  No  Are the toenails thick?:  No  Are the toenails ingrown?:  No  Normal Range of Motion:  Yes    Pulse Foot Exam completed.:  Yes  Right Dorsalis Pedis:  Present Left Dorsalis Pedis:  Present  Sensory Foot Exam Completed.:  Yes  Swelling:  No  Semmes-Weinstein Monofilament Test  R Site 1-Great Toe:  Pos L Site 1-Great Toe:  Pos  R Site 4:  Pos L Site 4:  Pos  R Site 5:  Pos L Site 5:  Pos           Assessment & Plan:   Problem List Items Addressed This Visit      Endocrine   Diabetes mellitus type 2, controlled (HCC) -  Primary (Chronic)    Check A1c here shows uncontrolled sugars      Relevant Medications   metFORMIN (GLUCOPHAGE-XR) 500 MG 24 hr tablet   Other Relevant Orders   POCT HgB A1C (Completed)   Lipid Panel w/o Chol/HDL Ratio   Comprehensive metabolic panel   Microalbumin / creatinine urine ratio     Other   Major depression (HCC)    She has crisis numbers; [facility]      Medication monitoring encounter   Relevant Orders   Comprehensive metabolic panel   Preventative health care    USPSTF  grade A and B recommendations reviewed with patient; age-appropriate recommendations, preventive care, screening tests, etc discussed and encouraged; healthy living encouraged; see AVS for patient education given to patient       Other Visit Diagnoses    Gonorrhea contact, treated        Relevant Orders    HIV antibody    GC/chlamydia probe amp, urine    HSV(herpes smplx)abs-1+2(IgG+IgM)-bld (Completed)    RPR Qual (Completed)    Hepatitis panel, acute (Completed)       Follow up plan: Return in about 3 months (around 01/27/2016) for diabetes and fasting labs.  An after-visit summary was printed and given to the patient at check-out.  Please see the patient instructions which may contain other information and recommendations beyond what is mentioned above in the assessment and plan.  Meds ordered this encounter  Medications  . metFORMIN (GLUCOPHAGE-XR) 500 MG 24 hr tablet    Sig: Take 1 tablet (500 mg total) by mouth daily.    Dispense:  30 tablet    Refill:  5    Orders Placed This Encounter  Procedures  . GC/Chlamydia Probe Amp  . HIV antibody  . GC/chlamydia probe amp, urine  . HSV(herpes smplx)abs-1+2(IgG+IgM)-bld  . RPR Qual  . Hepatitis panel, acute  . Lipid Panel w/o Chol/HDL Ratio  . Comprehensive metabolic panel  . Microalbumin / creatinine urine ratio  . POCT HgB A1C

## 2015-10-27 NOTE — Assessment & Plan Note (Addendum)
Check A1c here shows uncontrolled sugars

## 2015-10-27 NOTE — Patient Instructions (Addendum)
Let's get labs today  Please do see your eye doctor regularly, and have your eyes examined every year (or more often per his or her recommendation)  Check your feet every night and let me know right away of any sores, infections, numbness, etc. Try to limit sweets, white bread, white rice, white potatoes It is okay with me for you to not check your fingerstick blood sugars (per [employer] of Endocrinology Best Practices), unless you are interested and feel it would be helpful for you  If you have not heard anything from my staff in a week about any orders/referrals/studies from today, please contact us here to follow-up [phone]  Do seek help if dark thoughts  Health Maintenance, Female Adopting a healthy lifestyle and getting preventive care can go a long way to promote health and wellness. Talk with your health care provider about what schedule of regular examinations is right for you. This is a good chance for you to check in with your provider about disease prevention and staying healthy. In between checkups, there are plenty of things you can do on your own. Experts have done a lot of research about which lifestyle changes and preventive measures are most likely to keep you healthy. Ask your health care provider for more information. WEIGHT AND DIET  Eat a healthy diet  Be sure to include plenty of vegetables, fruits, low-fat dairy products, and lean protein.  Do not eat a lot of foods high in solid fats, added sugars, or salt.  Get regular exercise. This is one of the most important things you can do for your health.  Most adults should exercise for at least 150 minutes each week. The exercise should increase your heart rate and make you sweat (moderate-intensity exercise).  Most adults should also do strengthening exercises at least twice a week. This is in addition to the moderate-intensity exercise.  Maintain a healthy weight  Body mass index (BMI) is a measurement  that can be used to identify possible weight problems. It estimates body fat based on height and weight. Your health care provider can help determine your BMI and help you achieve or maintain a healthy weight.  For females 65 years of age and older:   A BMI below 18.5 is considered underweight.  A BMI of 18.5 to 24.9 is normal.  A BMI of 25 to 29.9 is considered overweight.  A BMI of 30 and above is considered obese.  Watch levels of cholesterol and blood lipids  You should start having your blood tested for lipids and cholesterol at 22 years of age, then have this test every 5 years.  You may need to have your cholesterol levels checked more often if:  Your lipid or cholesterol levels are high.  You are older than 22 years of age.  You are at high risk for heart disease.  CANCER SCREENING   Lung Cancer  Lung cancer screening is recommended for adults 61-34 years old who are at high risk for lung cancer because of a history of smoking.  A yearly low-dose CT scan of the lungs is recommended for people who:  Currently smoke.  Have quit within the past 15 years.  Have at least a 30-pack-year history of smoking. A pack year is smoking an average of one pack of cigarettes a day for 1 year.  Yearly screening should continue until it has been 15 years since you quit.  Yearly screening should stop if you develop a health problem  that would prevent you from having lung cancer treatment.  Breast Cancer  Practice breast self-awareness. This means understanding how your breasts normally appear and feel.  It also means doing regular breast self-exams. Let your health care provider know about any changes, no matter how small.  If you are in your 20s or 30s, you should have a clinical breast exam (CBE) by a health care provider every 1-3 years as part of a regular health exam.  If you are 68 or older, have a CBE every year. Also consider having a breast X-ray (mammogram) every  year.  If you have a family history of breast cancer, talk to your health care provider about genetic screening.  If you are at high risk for breast cancer, talk to your health care provider about having an MRI and a mammogram every year.  Breast cancer gene (BRCA) assessment is recommended for women who have family members with BRCA-related cancers. BRCA-related cancers include:  Breast.  Ovarian.  Tubal.  Peritoneal cancers.  Results of the assessment will determine the need for genetic counseling and BRCA1 and BRCA2 testing. Cervical Cancer Your health care provider may recommend that you be screened regularly for cancer of the pelvic organs (ovaries, uterus, and vagina). This screening involves a pelvic examination, including checking for microscopic changes to the surface of your cervix (Pap test). You may be encouraged to have this screening done every 3 years, beginning at age 66.  For women ages 28-65, health care providers may recommend pelvic exams and Pap testing every 3 years, or they may recommend the Pap and pelvic exam, combined with testing for human papilloma virus (HPV), every 5 years. Some types of HPV increase your risk of cervical cancer. Testing for HPV may also be done on women of any age with unclear Pap test results.  Other health care providers may not recommend any screening for nonpregnant women who are considered low risk for pelvic cancer and who do not have symptoms. Ask your health care provider if a screening pelvic exam is right for you.  If you have had past treatment for cervical cancer or a condition that could lead to cancer, you need Pap tests and screening for cancer for at least 20 years after your treatment. If Pap tests have been discontinued, your risk factors (such as having a new sexual partner) need to be reassessed to determine if screening should resume. Some women have medical problems that increase the chance of getting cervical cancer. In  these cases, your health care provider may recommend more frequent screening and Pap tests. Colorectal Cancer  This type of cancer can be detected and often prevented.  Routine colorectal cancer screening usually begins at 22 years of age and continues through 22 years of age.  Your health care provider may recommend screening at an earlier age if you have risk factors for colon cancer.  Your health care provider may also recommend using home test kits to check for hidden blood in the stool.  A small camera at the end of a tube can be used to examine your colon directly (sigmoidoscopy or colonoscopy). This is done to check for the earliest forms of colorectal cancer.  Routine screening usually begins at age 69.  Direct examination of the colon should be repeated every 5-10 years through 22 years of age. However, you may need to be screened more often if early forms of precancerous polyps or small growths are found. Skin Cancer  Check your skin  from head to toe regularly.  Tell your health care provider about any new moles or changes in moles, especially if there is a change in a mole's shape or color.  Also tell your health care provider if you have a mole that is larger than the size of a pencil eraser.  Always use sunscreen. Apply sunscreen liberally and repeatedly throughout the day.  Protect yourself by wearing long sleeves, pants, a wide-brimmed hat, and sunglasses whenever you are outside. HEART DISEASE, DIABETES, AND HIGH BLOOD PRESSURE   High blood pressure causes heart disease and increases the risk of stroke. High blood pressure is more likely to develop in:  People who have blood pressure in the high end of the normal range (130-139/85-89 mm Hg).  People who are overweight or obese.  People who are African American.  If you are 82-31 years of age, have your blood pressure checked every 3-5 years. If you are 23 years of age or older, have your blood pressure checked  every year. You should have your blood pressure measured twice--once when you are at a hospital or clinic, and once when you are not at a hospital or clinic. Record the average of the two measurements. To check your blood pressure when you are not at a hospital or clinic, you can use:  An automated blood pressure machine at a pharmacy.  A home blood pressure monitor.  If you are between 61 years and 41 years old, ask your health care provider if you should take aspirin to prevent strokes.  Have regular diabetes screenings. This involves taking a blood sample to check your fasting blood sugar level.  If you are at a normal weight and have a low risk for diabetes, have this test once every three years after 22 years of age.  If you are overweight and have a high risk for diabetes, consider being tested at a younger age or more often. PREVENTING INFECTION  Hepatitis B  If you have a higher risk for hepatitis B, you should be screened for this virus. You are considered at high risk for hepatitis B if:  You were born in a country where hepatitis B is common. Ask your health care provider which countries are considered high risk.  Your parents were born in a high-risk country, and you have not been immunized against hepatitis B (hepatitis B vaccine).  You have HIV or AIDS.  You use needles to inject street drugs.  You live with someone who has hepatitis B.  You have had sex with someone who has hepatitis B.  You get hemodialysis treatment.  You take certain medicines for conditions, including cancer, organ transplantation, and autoimmune conditions. Hepatitis C  Blood testing is recommended for:  Everyone born from [birth year] through 1967.  Anyone with known risk factors for hepatitis C. Sexually transmitted infections (STIs)  You should be screened for sexually transmitted infections (STIs) including gonorrhea and chlamydia if:  You are sexually active and are younger than 22 years  of age.  You are older than 22 years of age and your health care provider tells you that you are at risk for this type of infection.  Your sexual activity has changed since you were last screened and you are at an increased risk for chlamydia or gonorrhea. Ask your health care provider if you are at risk.  If you do not have HIV, but are at risk, it may be recommended that you take a prescription medicine daily to prevent  HIV infection. This is called pre-exposure prophylaxis (PrEP). You are considered at risk if:  You are sexually active and do not regularly use condoms or know the HIV status of your partner(s).  You take drugs by injection.  You are sexually active with a partner who has HIV. Talk with your health care provider about whether you are at high risk of being infected with HIV. If you choose to begin PrEP, you should first be tested for HIV. You should then be tested every 3 months for as long as you are taking PrEP.  PREGNANCY   If you are premenopausal and you may become pregnant, ask your health care provider about preconception counseling.  If you may become pregnant, take 400 to 800 micrograms (mcg) of folic acid every day.  If you want to prevent pregnancy, talk to your health care provider about birth control (contraception). OSTEOPOROSIS AND MENOPAUSE   Osteoporosis is a disease in which the bones lose minerals and strength with aging. This can result in serious bone fractures. Your risk for osteoporosis can be identified using a bone density scan.  If you are 37 years of age or older, or if you are at risk for osteoporosis and fractures, ask your health care provider if you should be screened.  Ask your health care provider whether you should take a calcium or vitamin D supplement to lower your risk for osteoporosis.  Menopause may have certain physical symptoms and risks.  Hormone replacement therapy may reduce some of these symptoms and risks. Talk to your  health care provider about whether hormone replacement therapy is right for you.  HOME CARE INSTRUCTIONS   Schedule regular health, dental, and eye exams.  Stay current with your immunizations.   Do not use any tobacco products including cigarettes, chewing tobacco, or electronic cigarettes.  If you are pregnant, do not drink alcohol.  If you are breastfeeding, limit how much and how often you drink alcohol.  Limit alcohol intake to no more than 1 drink per day for nonpregnant women. One drink equals 12 ounces of beer, 5 ounces of wine, or 1 ounces of hard liquor.  Do not use street drugs.  Do not share needles.  Ask your health care provider for help if you need support or information about quitting drugs.  Tell your health care provider if you often feel depressed.  Tell your health care provider if you have ever been abused or do not feel safe at home.   This information is not intended to replace advice given to you by your health care provider. Make sure you discuss any questions you have with your health care provider.   Document Released: 12/13/2010 Document Revised: 06/20/2014 Document Reviewed: 05/01/2013 Elsevier Interactive Patient Education [employer].

## 2015-10-27 NOTE — Progress Notes (Signed)
  Patient ID: Penny Gonzalez, female   DOB: 05/30/1996, 20 y.o.   MRN: [id number]   Subjective:   Penny Gonzalez is a 20 y.o. female here for a complete physical exam  Interim issues since last visit:   Past Medical History  Diagnosis Date  . Diabetes mellitus without complication (HCC)   . Irregular periods/menstrual cycles   . Joint pain of leg   . Depression     patient has not been officially diagnosed but has symptoms  . Anemia, iron deficiency, inadequate dietary intake 12/01/2014   No past surgical history on file.   Family History  Problem Relation Age of Onset  . Diabetes Mother   . Arthritis Father   . Diabetes Father   . Heart disease Father   . Hypertension Father   . Stroke Father   . Hearing loss Sister   . Hypertension Brother   . Hypertension Paternal Grandfather    Social History  Substance Use Topics  . Smoking status: Never Smoker   . Smokeless tobacco: Not on file  . Alcohol Use: No   Review of Systems  Objective:   Filed Vitals:   10/27/15 1401  BP: 124/84  Pulse: 97  Temp: 99.2 F (37.3 C)  TempSrc: Oral  Resp: 16  Weight: 235 lb (106.595 kg)  SpO2: 95%   Body mass index is 35.74 kg/(m^2). Wt Readings from Last 3 Encounters:  10/27/15 235 lb (106.595 kg)  09/15/15 230 lb (104.327 kg)  09/03/15 230 lb (104.327 kg)   Physical Exam  Assessment/Plan:   Problem List Items Addressed This Visit    None       No orders of the defined types were placed in this encounter.   No orders of the defined types were placed in this encounter.    Follow up plan: No Follow-up on file.  An After Visit Summary was printed and given to the patient.

## 2015-10-27 NOTE — Assessment & Plan Note (Signed)
USPSTF grade A and B recommendations reviewed with patient; age-appropriate recommendations, preventive care, screening tests, etc discussed and encouraged; healthy living encouraged; see AVS for patient education given to patient  

## 2015-10-29 ENCOUNTER — Telehealth: Payer: Self-pay

## 2015-10-29 LAB — HEPATITIS PANEL, ACUTE
Hep A IgM: NEGATIVE
Hep B C IgM: NEGATIVE
Hep C Virus Ab: <0.1 s/co ratio (ref 0.0–0.9)
Hepatitis B Surface Ag: NEGATIVE

## 2015-10-29 LAB — GC/CHLAMYDIA PROBE AMP
Chlamydia trachomatis, NAA: NEGATIVE
NEISSERIA GONORRHOEAE BY PCR: NEGATIVE

## 2015-10-29 LAB — HSV(HERPES SMPLX)ABS-I+II(IGG+IGM)-BLD
HSV 1 GLYCOPROTEIN G AB, IGG: 40.5 {index} — AB (ref 0.00–0.90)
HSVI/II COMB AB IGM: 1.02 ratio — AB (ref 0.00–0.90)

## 2015-10-29 LAB — RPR QUALITATIVE: RPR: NONREACTIVE

## 2015-10-29 NOTE — Telephone Encounter (Signed)
I sent results to her already with explanations; you can read them to her or she can check her e-mail; thank you

## 2015-10-29 NOTE — Telephone Encounter (Signed)
Pt calling please review labs

## 2015-10-29 NOTE — Telephone Encounter (Signed)
Left voicemail with results.

## 2015-11-18 ENCOUNTER — Ambulatory Visit (INDEPENDENT_AMBULATORY_CARE_PROVIDER_SITE_OTHER): Payer: Medicaid Other | Admitting: Family Medicine

## 2015-11-18 ENCOUNTER — Encounter: Payer: Self-pay | Admitting: Family Medicine

## 2015-11-18 VITALS — BP 118/80 | HR 91 | Temp 98.6°F | Resp 16 | Wt 233.0 lb

## 2015-11-18 DIAGNOSIS — M545 Low back pain: Secondary | ICD-10-CM

## 2015-11-18 DIAGNOSIS — R5383 Other fatigue: Secondary | ICD-10-CM | POA: Diagnosis not present

## 2015-11-18 DIAGNOSIS — N926 Irregular menstruation, unspecified: Secondary | ICD-10-CM

## 2015-11-18 DIAGNOSIS — Z202 Contact with and (suspected) exposure to infections with a predominantly sexual mode of transmission: Secondary | ICD-10-CM | POA: Diagnosis not present

## 2015-11-18 LAB — POCT URINALYSIS DIPSTICK
Bilirubin, UA: NEGATIVE
Glucose, UA: NEGATIVE
Ketones, UA: NEGATIVE
NITRITE UA: NEGATIVE
PH UA: 5
Protein, UA: NEGATIVE
RBC UA: NEGATIVE
Spec Grav, UA: 1.015
UROBILINOGEN UA: 0.2

## 2015-11-18 LAB — POCT URINE PREGNANCY: Preg Test, Ur: NEGATIVE

## 2015-11-18 MED ORDER — NORETHINDRONE ACET-ETHINYL EST 1-20 MG-MCG PO TABS: 1.0000 | ORAL_TABLET | Freq: Every day | ORAL | Status: DC

## 2015-11-18 MED ORDER — METRONIDAZOLE 500 MG PO TABS: 500.0000 mg | ORAL_TABLET | Freq: Two times a day (BID) | ORAL | Status: AC

## 2015-11-18 MED ORDER — NITROFURANTOIN MONOHYD MACRO 100 MG PO CAPS: 100.0000 mg | ORAL_CAPSULE | Freq: Two times a day (BID) | ORAL | Status: DC

## 2015-11-18 NOTE — Patient Instructions (Addendum)
We'll get some labs today We'll also get a pelvic ultrasound Start the macrobid and the metronidazole Be safe and smart  Sexually Transmitted Disease A sexually transmitted disease (STD) is a disease or infection often passed to another person during sex. However, STDs can be passed through nonsexual ways. An STD can be passed through:  Spit (saliva).  Semen.  Blood.  Mucus from the vagina.  Pee (urine). HOW CAN I LESSEN MY CHANCES OF GETTING AN STD?  Use:  Latex condoms.  Water-soluble lubricants with condoms. Do not use petroleum jelly or oils.  Dental dams. These are small pieces of latex that are used as a barrier during oral sex.  Avoid having more than one sex partner.  Do not have sex with someone who has other sex partners.  Do not have sex with anyone you do not know or who is at high risk for an STD.  Avoid risky sex that can break your skin.  Do not have sex if you have open sores on your mouth or skin.  Avoid drinking too much alcohol or taking illegal drugs. Alcohol and drugs can affect your good judgment.  Avoid oral and anal sex acts.  Get shots (vaccines) for HPV and hepatitis.  If you are at risk of being infected with HIV, it is advised that you take a certain medicine daily to prevent HIV infection. This is called pre-exposure prophylaxis (PrEP). You may be at risk if:  You are a man who has sex with other men (MSM).  You are attracted to the opposite sex (heterosexual) and are having sex with more than one partner.  You take drugs with a needle.  You have sex with someone who has HIV.  Talk with your doctor about if you are at high risk of being infected with HIV. If you begin to take PrEP, get tested for HIV first. Get tested every 3 months for as long as you are taking PrEP.  Get tested for STDs every year if you are sexually active. If you are treated for an STD, get tested again 3 months after you are treated. WHAT SHOULD I DO IF I THINK  I HAVE AN STD?  See your doctor.  Tell your sex partner(s) that you have an STD. They should be tested and treated.  Do not have sex until your doctor says it is okay. WHEN SHOULD I GET HELP? Get help right away if:  You have bad belly (abdominal) pain.  You are a man and have puffiness (swelling) or pain in your testicles.  You are a woman and have puffiness in your vagina.   This information is not intended to replace advice given to you by your health care provider. Make sure you discuss any questions you have with your health care provider.   Document Released: 07/07/2004 Document Revised: 06/20/2014 Document Reviewed: 11/23/2012 Elsevier Interactive Patient Education [employer]2016 Elsevier Inc.
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
## 2015-11-19 ENCOUNTER — Other Ambulatory Visit: Payer: Self-pay | Admitting: Family Medicine

## 2015-11-19 ENCOUNTER — Telehealth: Payer: Self-pay | Admitting: Family Medicine

## 2015-11-19 DIAGNOSIS — E559 Vitamin D deficiency, unspecified: Secondary | ICD-10-CM | POA: Insufficient documentation

## 2015-11-19 LAB — CBC WITH DIFFERENTIAL/PLATELET
Basophils Absolute: 0 10*3/uL (ref 0.0–0.2)
Basos: 0 %
EOS (ABSOLUTE): 0.1 10*3/uL (ref 0.0–0.4)
EOS: 1 %
HEMATOCRIT: 40.4 % (ref 34.0–46.6)
HEMOGLOBIN: 13.3 g/dL (ref 11.1–15.9)
Immature Grans (Abs): 0 10*3/uL (ref 0.0–0.1)
Immature Granulocytes: 0 %
LYMPHS ABS: 3.2 10*3/uL — AB (ref 0.7–3.1)
Lymphs: 42 %
MCH: 27.8 pg (ref 26.6–33.0)
MCHC: 32.9 g/dL (ref 31.5–35.7)
MCV: 85 fL (ref 79–97)
MONOCYTES: 5 %
MONOS ABS: 0.4 10*3/uL (ref 0.1–0.9)
NEUTROS ABS: 3.9 10*3/uL (ref 1.4–7.0)
Neutrophils: 52 %
Platelets: 307 10*3/uL (ref 150–379)
RBC: 4.78 x10E6/uL (ref 3.77–5.28)
RDW: 15.7 % — AB (ref 12.3–15.4)
WBC: 7.5 10*3/uL (ref 3.4–10.8)

## 2015-11-19 LAB — HEPATITIS PANEL, ACUTE
HEP B C IGM: NEGATIVE
Hep A IgM: NEGATIVE
Hep C Virus Ab: <0.1 s/co ratio (ref 0.0–0.9)
Hepatitis B Surface Ag: NEGATIVE

## 2015-11-19 LAB — TSH: TSH: 1.4 u[IU]/mL (ref 0.450–4.500)

## 2015-11-19 LAB — RPR: RPR Ser Ql: NONREACTIVE

## 2015-11-19 LAB — VITAMIN B12: VITAMIN B 12: 459 pg/mL (ref 211–946)

## 2015-11-19 LAB — VITAMIN D 25 HYDROXY (VIT D DEFICIENCY, FRACTURES): VIT D 25 HYDROXY: 15.6 ng/mL — AB (ref 30.0–100.0)

## 2015-11-19 LAB — HIV ANTIBODY (ROUTINE TESTING W REFLEX): HIV SCREEN 4TH GENERATION: NONREACTIVE

## 2015-11-19 MED ORDER — VITAMIN D (ERGOCALCIFEROL) 1.25 MG (50000 UNIT) PO CAPS: 50000.0000 [IU] | ORAL_CAPSULE | ORAL | Status: DC

## 2015-11-19 NOTE — Telephone Encounter (Signed)
I sent her a note through MyChart, but you can call her if she didn't see it Her vitamin D is really low; that can cause fatigue; start prescription vitamin D once a week for 8 weeks, then just 1,000 iu daily Other labs are negative (great news) Thank you

## 2015-11-19 NOTE — Telephone Encounter (Signed)
PT ASKING FOR TEST RESULTS ON LABS

## 2015-11-20 LAB — URINE CULTURE

## 2015-11-23 ENCOUNTER — Encounter: Payer: Self-pay | Admitting: Family Medicine

## 2015-11-23 DIAGNOSIS — F329 Major depressive disorder, single episode, unspecified: Secondary | ICD-10-CM

## 2015-11-23 HISTORY — DX: Major depressive disorder, single episode, unspecified: F32.9

## 2015-11-23 NOTE — Assessment & Plan Note (Signed)
She has crisis numbers; [facility]

## 2015-11-24 ENCOUNTER — Telehealth: Payer: Self-pay | Admitting: Family Medicine

## 2015-11-24 DIAGNOSIS — A749 Chlamydial infection, unspecified: Secondary | ICD-10-CM

## 2015-11-24 NOTE — Telephone Encounter (Signed)
The GC/chlymdia is back results was left on you desk.  She is positive for chylmdia

## 2015-11-24 NOTE — Telephone Encounter (Signed)
It looks like we're still missing results for a urine microalbumin to creatinine ratio from May and the urine repeat for gonorrhea and chlamydia from June; please check with lab; if not done or pending, please ask patient to submit FIRST void urine of the morning for these; thanks

## 2015-11-25 ENCOUNTER — Ambulatory Visit
Admission: RE | Admit: 2015-11-25 | Discharge: 2015-11-25 | Disposition: A | Discharge status: Home / Self Care | Payer: Medicaid Other | Source: Ambulatory Visit | Attending: Family Medicine | Admitting: Family Medicine

## 2015-11-25 DIAGNOSIS — N926 Irregular menstruation, unspecified: Secondary | ICD-10-CM | POA: Diagnosis not present

## 2015-11-25 DIAGNOSIS — M545 Low back pain: Secondary | ICD-10-CM | POA: Diagnosis present

## 2015-11-25 DIAGNOSIS — A749 Chlamydial infection, unspecified: Secondary | ICD-10-CM | POA: Insufficient documentation

## 2015-11-25 MED ORDER — AZITHROMYCIN 250 MG PO TABS: ORAL_TABLET | ORAL | Status: AC

## 2015-11-25 NOTE — Telephone Encounter (Signed)
Patient requesting return call. Checking status on test results.

## 2015-11-25 NOTE — Telephone Encounter (Signed)
Please call at lunch

## 2015-11-25 NOTE — Telephone Encounter (Signed)
Discussed chlamydia; treat with azith 1 gm po x 1 Test of cure in 3 weeks, July 3rd Take a week break after 3 weeks of OCP (she has not been doing this)

## 2015-11-27 ENCOUNTER — Telehealth: Payer: Self-pay | Admitting: Family Medicine

## 2015-11-27 ENCOUNTER — Encounter: Payer: Self-pay | Admitting: Family Medicine

## 2015-11-27 MED ORDER — NYSTATIN 100000 UNIT/ML MT SUSP: 5.0000 mL | Freq: Four times a day (QID) | OROMUCOSAL | Status: DC

## 2015-11-27 NOTE — Telephone Encounter (Signed)
Patient has a question about what her and Dr. Sherie DonLada discussed a few days ago when she was called.  Patient would not go into detail as to what she needed to ask.

## 2015-11-27 NOTE — Telephone Encounter (Signed)
I spoke w/patient Patient was talking to her brother; question about herpes; no blisters or bumps down below Fever blister on mouth a few months back, nothing below White tongue; has had thrush before; will send in nystatin; she can fill if needed Be smart and safe

## 2015-12-08 ENCOUNTER — Ambulatory Visit: Payer: Medicaid Other | Admitting: Family Medicine

## 2015-12-10 ENCOUNTER — Encounter: Payer: Self-pay | Admitting: Family Medicine

## 2015-12-10 ENCOUNTER — Ambulatory Visit (INDEPENDENT_AMBULATORY_CARE_PROVIDER_SITE_OTHER): Payer: Medicaid Other | Admitting: Family Medicine

## 2015-12-10 ENCOUNTER — Other Ambulatory Visit: Payer: Self-pay | Admitting: Family Medicine

## 2015-12-10 VITALS — BP 112/76 | HR 98 | Temp 98.7°F | Resp 16 | Wt 238.4 lb

## 2015-12-10 DIAGNOSIS — A749 Chlamydial infection, unspecified: Secondary | ICD-10-CM | POA: Diagnosis not present

## 2015-12-10 DIAGNOSIS — Z202 Contact with and (suspected) exposure to infections with a predominantly sexual mode of transmission: Secondary | ICD-10-CM

## 2015-12-10 DIAGNOSIS — N898 Other specified noninflammatory disorders of vagina: Secondary | ICD-10-CM

## 2015-12-10 DIAGNOSIS — F331 Major depressive disorder, recurrent, moderate: Secondary | ICD-10-CM

## 2015-12-10 NOTE — Assessment & Plan Note (Signed)
Recent case of chlamydia; retest

## 2015-12-10 NOTE — Progress Notes (Signed)
BP 112/76 mmHg  Pulse 98  Temp(Src) 98.7 F (37.1 C) (Oral)  Resp 16  Wt 238 lb 6.4 oz (108.138 kg)  SpO2 99%   Subjective:    Patient ID: Penny Gonzalez, female    DOB: 02/07/1996, 20 y.o.   MRN: [id number]  HPI: Penny Gonzalez is a 20 y.o. female  Chief Complaint  Patient presents with  . Wrist Pain    Pt states it is locking up and numbness right   Patient is here c/o some right wrist pain She thought her fingers were getting numb Not laying on it at night, doesn't think it's from sleeping on it wrong Tried propping it up Having shooting pains in the right wrist; starts in the wrist and goes up to the 4th and 5th fingers Same motion on every plate, innoculating plates, lots of plates every day, repetitive movements at work  She noticed a fishy odor down below; went to the bathroom and noticed that smell; no problems on the medicine; somewhat a little itchy on the outside; clear discharge; she has had intercourse since last STI checks; she was with her ex-boyfriend but she's not worried about him, doesn't think she picked up anything from him  We reviewed her std testing from June 7th; she wanted to have repeat testing; she thinks she is going through a paranoid phase, just wants to be sure; she knows her body now; the wet feeling and itchy feeling concerns her and she thinks about it a lot; sees a psychiatrist (through Skype) and sees therapist in person; not on any medicine for anxiety  Relevant past medical, surgical, family and social history reviewed Past Medical History  Diagnosis Date  . Diabetes mellitus without complication (HCC)   . Irregular periods/menstrual cycles   . Joint pain of leg   . Depression     patient has not been officially diagnosed but has symptoms  . Anemia, iron deficiency, inadequate dietary intake 12/01/2014  . Major depression (HCC) 11/23/2015   Social History  Substance Use Topics  . Smoking status: Never Smoker   . Smokeless  tobacco: None  . Alcohol Use: No   Interim medical history since last visit reviewed. Allergies and medications reviewed  Review of Systems Per HPI unless specifically indicated above     Objective:    BP 112/76 mmHg  Pulse 98  Temp(Src) 98.7 F (37.1 C) (Oral)  Resp 16  Wt 238 lb 6.4 oz (108.138 kg)  SpO2 99%  Wt Readings from Last 3 Encounters:  12/10/15 238 lb 6.4 oz (108.138 kg)  11/18/15 233 lb (105.688 kg)  10/27/15 235 lb (106.595 kg)   body mass index is 36.26 kg/(m^2).  Physical Exam  Constitutional: She appears well-developed and well-nourished.  obese  Cardiovascular:  Pulses:      Radial pulses are 2+ on the right side.  Genitourinary: Uterus is not enlarged. Cervix exhibits no motion tenderness, no discharge and no friability. Right adnexum displays no mass, no tenderness and no fullness. Left adnexum displays no mass, no tenderness and no fullness. No bleeding in the vagina. Vaginal discharge (non-odorous) found.  Musculoskeletal:       Right wrist: She exhibits normal range of motion, no tenderness, no swelling, no effusion, no crepitus and no deformity.  Neurological:  No sensory deficit of either hand; no muscular wasting of right hand/arm  Psychiatric: Her mood appears not anxious. She does not exhibit a depressed mood.  Good eye contact examiner  Assessment & Plan:   Problem List Items Addressed This Visit      Other   Major depression (HCC)    Encouraged patient to talk with her therapist and psychiatrist, as she might benefit from medication      Relevant Orders   GC/chlamydia probe amp, urine   Contact with and suspected exposure to infections with predominantly sexual mode of transmission - Primary    Patient wishes to recheck for trich, GC, chlamydia; safety encouraged      Chlamydia    Recent case of chlamydia; retest      Relevant Orders   GC/chlamydia probe amp, urine    Other Visit Diagnoses    Vaginal discharge         Relevant Orders    GC/chlamydia probe amp, urine        Follow up plan: Return if symptoms worsen or fail to improve, for (See you in August).  An after-visit summary was printed and given to the patient at check-out.  Please see the patient instructions which may contain other information and recommendations beyond what is mentioned above in the assessment and plan.  Orders Placed This Encounter  Procedures  . WET PREP BY MOLECULAR PROBE  . GC/chlamydia probe amp, urine

## 2015-12-10 NOTE — Patient Instructions (Addendum)
Be smart. Be safe. :-) Talk with your therapist and psychiatrist about maybe starting medicine for recurrent worrying.  Safe Sex Safe sex is about reducing the risk of giving or getting a sexually transmitted disease (STD). STDs are spread through sexual contact involving the genitals, mouth, or rectum. Some STDs can be cured and others cannot. Safe sex can also prevent unintended pregnancies.  WHAT ARE SOME SAFE SEX PRACTICES?  Limit your sexual activity to only one partner who is having sex with only you.  Talk to your partner about his or her past partners, past STDs, and drug use.  Use a condom every time you have sexual intercourse. This includes vaginal, oral, and anal sexual activity. Both females and males should wear condoms during oral sex. Only use latex or polyurethane condoms and water-based lubricants. Using petroleum-based lubricants or oils to lubricate a condom will weaken the condom and increase the chance that it will break. The condom should be in place from the beginning to the end of sexual activity. Wearing a condom reduces, but does not completely eliminate, your risk of getting or giving an STD. STDs can be spread by contact with infected body fluids and skin.  Get vaccinated for hepatitis B and HPV.  Avoid alcohol and recreational drugs, which can affect your judgment. You may forget to use a condom or participate in high-risk sex.  For females, avoid douching after sexual intercourse. Douching can spread an infection farther into the reproductive tract.  Check your body for signs of sores, blisters, rashes, or unusual discharge. See your health care provider if you notice any of these signs.  Avoid sexual contact if you have symptoms of an infection or are being treated for an STD. If you or your partner has herpes, avoid sexual contact when blisters are present. Use condoms at all other times.  If you are at risk of being infected with HIV, it is recommended that  you take a prescription medicine daily to prevent HIV infection. This is called pre-exposure prophylaxis (PrEP). You are considered at risk if:  You are a man who has sex with other men (MSM).  You are a heterosexual man or woman who is sexually active with more than one partner.  You take drugs by injection.  You are sexually active with a partner who has HIV.  Talk with your health care provider about whether you are at high risk of being infected with HIV. If you choose to begin PrEP, you should first be tested for HIV. You should then be tested every 3 months for as long as you are taking PrEP.  See your health care provider for regular screenings, exams, and tests for other STDs. Before having sex with a new partner, each of you should be screened for STDs and should talk about the results with each other. WHAT ARE THE BENEFITS OF SAFE SEX?   There is less chance of getting or giving an STD.  You can prevent unwanted or unintended pregnancies.  By discussing safe sex concerns with your partner, you may increase feelings of intimacy, comfort, trust, and honesty between the two of you.   This information is not intended to replace advice given to you by your health care provider. Make sure you discuss any questions you have with your health care provider.   Document Released: 07/07/2004 Document Revised: 06/20/2014 Document Reviewed: 11/21/2011 Elsevier Interactive Patient Education [employer] Elsevier Inc.

## 2015-12-10 NOTE — Assessment & Plan Note (Signed)
Encouraged patient to talk with her therapist and psychiatrist, as she might benefit from medication

## 2015-12-10 NOTE — Assessment & Plan Note (Signed)
Patient wishes to recheck for trich, GC, chlamydia; safety encouraged

## 2015-12-11 LAB — GC/CHLAMYDIA PROBE AMP
CT Probe RNA: NOT DETECTED
GC Probe RNA: NOT DETECTED

## 2015-12-12 ENCOUNTER — Other Ambulatory Visit: Payer: Self-pay | Admitting: Family Medicine

## 2015-12-12 LAB — WET PREP BY MOLECULAR PROBE
Candida species: POSITIVE — AB
GARDNERELLA VAGINALIS: NEGATIVE
TRICHOMONAS VAG: NEGATIVE

## 2015-12-12 MED ORDER — FLUCONAZOLE 150 MG PO TABS: 150.0000 mg | ORAL_TABLET | Freq: Once | ORAL | Status: DC

## 2015-12-14 ENCOUNTER — Telehealth: Payer: Self-pay | Admitting: Family Medicine

## 2015-12-14 NOTE — Telephone Encounter (Signed)
Requesting return call pertaining to results that she had received from you and the health department.

## 2015-12-16 NOTE — Telephone Encounter (Signed)
I returned her call; she was checked out at the health dept; they did testing; it was a week after we saw her; she told them about her test; their test takes up to 3 weeks I spoke with pt, she was already treated, then retested on 12/10/15 and it was negative

## 2015-12-30 ENCOUNTER — Emergency Department
Admission: EM | Admit: 2015-12-30 | Discharge: 2015-12-30 | Disposition: A | Discharge status: Home / Self Care | Payer: Medicaid Other | Attending: Emergency Medicine | Admitting: Emergency Medicine

## 2015-12-30 ENCOUNTER — Encounter: Payer: Self-pay | Admitting: Emergency Medicine

## 2015-12-30 DIAGNOSIS — Z7984 Long term (current) use of oral hypoglycemic drugs: Secondary | ICD-10-CM | POA: Insufficient documentation

## 2015-12-30 DIAGNOSIS — R1011 Right upper quadrant pain: Secondary | ICD-10-CM | POA: Diagnosis present

## 2015-12-30 DIAGNOSIS — E119 Type 2 diabetes mellitus without complications: Secondary | ICD-10-CM | POA: Diagnosis not present

## 2015-12-30 DIAGNOSIS — N898 Other specified noninflammatory disorders of vagina: Secondary | ICD-10-CM

## 2015-12-30 DIAGNOSIS — F329 Major depressive disorder, single episode, unspecified: Secondary | ICD-10-CM | POA: Insufficient documentation

## 2015-12-30 DIAGNOSIS — R109 Unspecified abdominal pain: Secondary | ICD-10-CM

## 2015-12-30 LAB — URINALYSIS COMPLETE WITH MICROSCOPIC (ARMC ONLY)
BILIRUBIN URINE: NEGATIVE
Glucose, UA: NEGATIVE mg/dL
HGB URINE DIPSTICK: NEGATIVE
Ketones, ur: NEGATIVE mg/dL
LEUKOCYTES UA: NEGATIVE
Nitrite: NEGATIVE
PH: 5 (ref 5.0–8.0)
PROTEIN: 30 mg/dL — AB
Specific Gravity, Urine: 1.024 (ref 1.005–1.030)

## 2015-12-30 LAB — CBC
HEMATOCRIT: 41.5 % (ref 35.0–47.0)
Hemoglobin: 13.8 g/dL (ref 12.0–16.0)
MCH: 28.2 pg (ref 26.0–34.0)
MCHC: 33.2 g/dL (ref 32.0–36.0)
MCV: 84.8 fL (ref 80.0–100.0)
PLATELETS: 243 10*3/uL (ref 150–440)
RBC: 4.89 MIL/uL (ref 3.80–5.20)
RDW: 15.3 % — ABNORMAL HIGH (ref 11.5–14.5)
WBC: 7.6 10*3/uL (ref 3.6–11.0)

## 2015-12-30 LAB — COMPREHENSIVE METABOLIC PANEL
ALT: 26 U/L (ref 14–54)
ANION GAP: 8 (ref 5–15)
AST: 23 U/L (ref 15–41)
Albumin: 4.1 g/dL (ref 3.5–5.0)
Alkaline Phosphatase: 53 U/L (ref 38–126)
BUN: 9 mg/dL (ref 6–20)
CO2: 25 mmol/L (ref 22–32)
CREATININE: 0.7 mg/dL (ref 0.44–1.00)
Calcium: 9.4 mg/dL (ref 8.9–10.3)
Chloride: 106 mmol/L (ref 101–111)
GFR calc non Af Amer: >60 mL/min (ref >60)
Glucose, Bld: 89 mg/dL (ref 65–99)
POTASSIUM: 3.8 mmol/L (ref 3.5–5.1)
SODIUM: 139 mmol/L (ref 135–145)
Total Bilirubin: 0.4 mg/dL (ref 0.3–1.2)
Total Protein: 7.3 g/dL (ref 6.5–8.1)

## 2015-12-30 LAB — WET PREP, GENITAL
Clue Cells Wet Prep HPF POC: NONE SEEN
Sperm: NONE SEEN
Trich, Wet Prep: NONE SEEN
WBC, Wet Prep HPF POC: NONE SEEN
Yeast Wet Prep HPF POC: NONE SEEN

## 2015-12-30 LAB — CHLAMYDIA/NGC RT PCR (ARMC ONLY)
Chlamydia Tr: NOT DETECTED
N GONORRHOEAE: NOT DETECTED

## 2015-12-30 LAB — POCT PREGNANCY, URINE: PREG TEST UR: NEGATIVE

## 2015-12-30 LAB — LIPASE, BLOOD: LIPASE: 19 U/L (ref 11–51)

## 2015-12-30 MED ORDER — KETOROLAC TROMETHAMINE 30 MG/ML IJ SOLN
30.0000 mg | Freq: Once | INTRAMUSCULAR | Status: AC
  Administered 2015-12-30: 30 mg via INTRAVENOUS
  Filled 2015-12-30: qty 1

## 2015-12-30 MED ORDER — SODIUM CHLORIDE 0.9 % IV BOLUS (SEPSIS)
1000.0000 mL | Freq: Once | INTRAVENOUS | Status: AC
  Administered 2015-12-30: 1000 mL via INTRAVENOUS

## 2015-12-30 MED ORDER — ONDANSETRON HCL 4 MG/2ML IJ SOLN
4.0000 mg | Freq: Once | INTRAMUSCULAR | Status: AC
Start: 2015-12-30 — End: 2015-12-30
  Administered 2015-12-30: 4 mg via INTRAVENOUS
  Filled 2015-12-30: qty 2

## 2015-12-30 NOTE — Discharge Instructions (Signed)
You have been seen today in the Emergency Department (ED)  for pelvic pain.  Your workup has shown negative results, but as we discussed, gonorrhea and chlamydia tests take several days to result and if POSITIVE you will get a call at home (you will not get a call if you do not have an infection).  Please have your partner tested for STD's and do not resume sexual activity until you and your partner have confirmed negative test results or have both been treated.  Please follow up with your doctor in 1-2 days regarding todays Emergency Department (ED)  visit and your symptoms.   Return to the Emergency Department (ED)  if your pain worsens, you develop a fever, you develop abdominal pain, or any other symptoms that concern you.

## 2015-12-30 NOTE — ED Notes (Signed)
Pt c/o abd pain and nausea for two days, denies any other sx. NAD.

## 2015-12-30 NOTE — ED Provider Notes (Signed)
[facility] Emergency Department Provider Note  ____________________________________________  Time seen: Approximately 11:36 AM  I have reviewed the triage vital signs and the nursing notes.   HISTORY  Chief Complaint Abdominal Pain and Nausea   HPI Penny Gonzalez is a 20 y.o. female history of diabetes, cholelithiasis, chronic right upper quadrant abdominal pain with multiple negative ultrasounds and HIDA scan in November 2016 who presents for evaluation of abdominal pain. Patient reports 2-3 days of intermittent crampy right flank pain. She reports that the pain is similar to her prior presentations where she has had workup for gallbladder with no acute findings other than cholelithiasis. She reports that the pain is worse when she sits for prolonged period of time and he gets better when she gets up and walks. She denies hematuria but does endorse frequency. She reports that she finished treatment for a urinary tract infection a week ago. She also reports a last month she was treated for chlamydia and is concerned that her symptoms have not cleared and she continues to have a foul smelling vaginal discharge. She reports that she is sexually active with two partners and does not use protection.She endorses some nausea but no vomiting and is eating normally. She denies prior history of kidney stones. She currently endorses mild pain located in her right flank, radiating across to the right upper quadrant, nonpleuritic.  Past Medical History  Diagnosis Date  . Diabetes mellitus without complication (HCC)   . Irregular periods/menstrual cycles   . Joint pain of leg   . Depression     patient has not been officially diagnosed but has symptoms  . Anemia, iron deficiency, inadequate dietary intake 12/01/2014  . Major depression (HCC) 11/23/2015    Patient Active Problem List   Diagnosis Date Noted  . Chlamydia 11/25/2015  . Major depression (HCC) 11/23/2015  .  Vitamin D deficiency 11/19/2015  . Fatigue 11/18/2015  . Preventative health care 10/27/2015  . Medication monitoring encounter 10/27/2015  . Cholelithiasis 03/10/2015  . Cholelithiasis without obstruction 03/10/2015  . Constipation, slow transit 02/18/2015  . Chronic right upper quadrant pain 02/18/2015  . Tonsil stone 02/18/2015  . Anemia, iron deficiency, inadequate dietary intake 12/01/2014  . Diabetes mellitus type 2, controlled (HCC) 11/26/2014  . Headache disorder 11/26/2014  . Arthralgia of multiple joints 11/26/2014  . Obesity, Class II, BMI 35-39.9 11/26/2014  . Contact with and suspected exposure to infections with predominantly sexual mode of transmission 11/26/2014  . Missed period 11/26/2014  . Diabetes in pregnancy (HCC) 06/24/2014    History reviewed. No pertinent past surgical history.  Current Outpatient Rx  Name  Route  Sig  Dispense  Refill  . metFORMIN (GLUCOPHAGE-XR) 500 MG 24 hr tablet   Oral   Take 1 tablet (500 mg total) by mouth daily.   30 tablet   5   . norethindrone-ethinyl estradiol (MICROGESTIN,JUNEL,LOESTRIN) 1-20 MG-MCG tablet   Oral   Take 1 tablet by mouth daily. Then take a week off before starting next pack   1 Package   0     Allergies Review of patient's allergies indicates no known allergies.  Family History  Problem Relation Age of Onset  . Diabetes Mother   . Arthritis Father   . Diabetes Father   . Heart disease Father   . Hypertension Father   . Stroke Father   . Hearing loss Sister   . Hypertension Brother   . Hypertension Paternal Grandfather     Social  History Social History  Substance Use Topics  . Smoking status: Never Smoker   . Smokeless tobacco: None  . Alcohol Use: No    Review of Systems  Constitutional: Negative for fever. Eyes: Negative for visual changes. ENT: Negative for sore throat. Cardiovascular: Negative for chest pain. Respiratory: Negative for shortness of breath. Gastrointestinal: +  RUQ for abdominal pain and nausea. No vomiting or diarrhea. Genitourinary: Negative for dysuria. + frequency and R flank pain Musculoskeletal: Negative for back pain. Skin: Negative for rash. Neurological: Negative for headaches, weakness or numbness.  ____________________________________________   PHYSICAL EXAM:  VITAL SIGNS: ED Triage Vitals  Enc Vitals Group     BP 12/30/15 1057 133/87 mmHg     Pulse Rate 12/30/15 1057 92     Resp 12/30/15 1057 18     Temp 12/30/15 1057 98.2 F (36.8 C)     Temp Source 12/30/15 1057 Oral     SpO2 12/30/15 1057 100 %     Weight 12/30/15 1057 235 lb (106.595 kg)     Height 12/30/15 1057 5\' 8"  (1.727 m)     Head Cir --      Peak Flow --      Pain Score 12/30/15 1051 8     Pain Loc --      Pain Edu? --      Excl. in GC? --     Constitutional: Alert and oriented. Well appearing and in no apparent distress. HEENT:      Head: Normocephalic and atraumatic.         Eyes: Conjunctivae are normal. Sclera is non-icteric. EOMI. PERRL      Mouth/Throat: Mucous membranes are moist.       Neck: Supple with no signs of meningismus. Cardiovascular: Regular rate and rhythm. No murmurs, gallops, or rubs. 2+ symmetrical distal pulses are present in all extremities. No JVD. Respiratory: Normal respiratory effort. Lungs are clear to auscultation bilaterally. No wheezes, crackles, or rhonchi.  Gastrointestinal: Soft, non tender, and non distended with positive bowel sounds. No rebound or guarding. Genitourinary: No CVA tenderness. Pelvic exam: Normal external genitalia, no rashes or lesions. Normal cervical mucus. Os closed. No cervical motion tenderness.  No uterine or adnexal tenderness.   Musculoskeletal: Nontender with normal range of motion in all extremities. No edema, cyanosis, or erythema of extremities. Neurologic: Normal speech and language. Face is symmetric. Moving all extremities. No gross focal neurologic deficits are appreciated. Skin: Skin is  warm, dry and intact. No rash noted. Psychiatric: Mood and affect are normal. Speech and behavior are normal.  ____________________________________________   LABS (all labs ordered are listed, but only abnormal results are displayed)  Labs Reviewed  CBC - Abnormal; Notable for the following:    RDW 15.3 (*)    All other components within normal limits  URINALYSIS COMPLETEWITH MICROSCOPIC ([facility] ONLY) - Abnormal; Notable for the following:    Color, Urine YELLOW (*)    APPearance HAZY (*)    Protein, ur 30 (*)    Bacteria, UA RARE (*)    Squamous Epithelial / LPF 6-30 (*)    All other components within normal limits  WET PREP, GENITAL  CHLAMYDIA/NGC RT PCR ([facility] ONLY)  LIPASE, BLOOD  COMPREHENSIVE METABOLIC PANEL  POC URINE PREG, ED  POCT PREGNANCY, URINE   ____________________________________________  EKG  none ____________________________________________  RADIOLOGY  none  ____________________________________________   PROCEDURES  Procedure(s) performed: None Critical Care performed:  None ____________________________________________   INITIAL IMPRESSION / ASSESSMENT AND  PLAN / ED COURSE  20 y.o. female history of diabetes, cholelithiasis, chronic right upper quadrant abdominal pain with multiple negative ultrasounds and HIDA scan in November 2016 who presents for evaluation of intermittent right flank/right upper quadrant abdominal pain associated with nausea, worse when sitting down for a prolonged period of time. Pain is not postprandial. Patient is also concerned that she might have an STD due to vaginal discharge or foul smell. She also concerned she might have a UTI with urinary frequency. On exam she is extremely well appearing, no distress, her vital signs are within normal limits, her abdomen is soft and completely nontender throughout, no flank tenderness. Labs within normal limits with negative CMP, CBC, U pregnant, and UA. Will perform pelvic exam  to rule out PID vs STD. No indication for imaging with no tenderness and normal labs.  _________________________ 2:34 PM on 12/30/2015 -----------------------------------------  Pelvic exam with no cervical motion tenderness or adnexal tenderness and his normal amount of cervical mucus. Wet prep negative. Patient is feeling markedly improved after Toradol. She is tolerating by mouth. We'll hold off on treating her for GC and chlamydia at this time as her symptoms are very mild and her exam is reassuring. Patient is in agreement. We'll discharge home and follow-up with primary care doctor.  Pertinent labs & imaging results that were available during my care of the patient were reviewed by me and considered in my medical decision making (see chart for details).    I discussed my evaluation of the patient's symptoms, my clinical impression, and my proposed outpatient treatment plan with patient. We have discussed anticipatory guidance, scheduled follow-up, and careful return precautions. The patient expresses understanding and is comfortable with the discharge plan. All patient's questions were answered.   ____________________________________________   FINAL CLINICAL IMPRESSION(S) / ED DIAGNOSES  Final diagnoses:  Abdominal pain, unspecified abdominal location  Vaginal discharge      NEW MEDICATIONS STARTED DURING THIS VISIT:  New Prescriptions   No medications on file     Note:  This document was prepared using Dragon voice recognition software and may include unintentional dictation errors.    Nita Sickle, MD 12/30/15 1435

## 2016-01-21 ENCOUNTER — Ambulatory Visit (INDEPENDENT_AMBULATORY_CARE_PROVIDER_SITE_OTHER): Payer: Medicaid Other | Admitting: Family Medicine

## 2016-01-21 ENCOUNTER — Other Ambulatory Visit: Payer: Self-pay | Admitting: Family Medicine

## 2016-01-21 ENCOUNTER — Encounter: Payer: Self-pay | Admitting: Family Medicine

## 2016-01-21 DIAGNOSIS — F331 Major depressive disorder, recurrent, moderate: Secondary | ICD-10-CM

## 2016-01-21 DIAGNOSIS — E119 Type 2 diabetes mellitus without complications: Secondary | ICD-10-CM | POA: Diagnosis not present

## 2016-01-21 DIAGNOSIS — R300 Dysuria: Secondary | ICD-10-CM

## 2016-01-21 DIAGNOSIS — Z202 Contact with and (suspected) exposure to infections with a predominantly sexual mode of transmission: Secondary | ICD-10-CM | POA: Diagnosis not present

## 2016-01-21 DIAGNOSIS — K8020 Calculus of gallbladder without cholecystitis without obstruction: Secondary | ICD-10-CM | POA: Diagnosis not present

## 2016-01-21 DIAGNOSIS — E559 Vitamin D deficiency, unspecified: Secondary | ICD-10-CM | POA: Diagnosis not present

## 2016-01-21 MED ORDER — SERTRALINE HCL 50 MG PO TABS: ORAL_TABLET | ORAL | 0 refills | Status: DC

## 2016-01-21 MED ORDER — LEVONORGEST-ETH ESTRAD 91-DAY 0.15-0.03 MG PO TABS: 1.0000 | ORAL_TABLET | Freq: Every day | ORAL | 4 refills | Status: DC

## 2016-01-21 NOTE — Assessment & Plan Note (Signed)
Check next week with other labs

## 2016-01-21 NOTE — Assessment & Plan Note (Signed)
Start sertraline, f/u in 4 weeks; call if feeling worse, or seek help if needed

## 2016-01-21 NOTE — Progress Notes (Signed)
Pulse (!) 117   Temp 98.4 F (36.9 C) (Oral)   Resp 18   Ht 5\' 8"  (1.727 m)   Wt 238 lb 11.2 oz (108.3 kg)   SpO2 98%   BMI 36.29 kg/m    Subjective:    Patient ID: Penny Gonzalez, female    DOB: [birth year], 20 y.o.   MRN: [id number]  HPI: Penny Gonzalez is a 20 y.o. female  Chief Complaint  Patient presents with  . Medication Refill    3 month F/U, would like to have her urine checked for follow up on STD  . Diabetes    She wants urine rechecked and wants to f/u on the STD; has been overthinking herpes  Type 2 diabetes; due for labs; no problems with feet  She talked to her therapist about anxiety; she goes through someone else to get medication; she thought it would be okay and thinks it would be a good idea; she had a bad anxiety attack yesterday; she went to court on August 1st; he got probation for a year and has to take anger management  She is unhappy with her birth control; she was having heavy bleeding with the withdrawal bleeding; felt like everything was hurting; she was on the patch before and might put on another patch rather; she does not want Depo; she does not want Mirena; she started depo and blew up and gained 30 pounds in 3 months  Vit D low, 15.6 in June 2017  Depression screen [facility] 2/9 01/21/2016 10/27/2015 02/18/2015 11/26/2014  Decreased Interest 1 2 0 1  Down, Depressed, Hopeless 1 2 0 1  PHQ - 2 Score 2 4 0 2  Altered sleeping 3 1 - 1  Tired, decreased energy 3 1 - 3  Change in appetite 1 3 - 0  Feeling bad or failure about yourself  1 2 - 1  Trouble concentrating 1 1 - 0  Moving slowly or fidgety/restless 1 0 - 0  Suicidal thoughts 0 1 - 0  PHQ-9 Score 12 13 - 7  Difficult doing work/chores Somewhat difficult Somewhat difficult - Somewhat difficult    No flowsheet data found.  Relevant past medical, surgical, family and social history reviewed Past Medical History:  Diagnosis Date  . Anemia, iron deficiency, inadequate dietary intake  12/01/2014  . Depression    patient has not been officially diagnosed but has symptoms  . Diabetes mellitus without complication (HCC)   . Irregular periods/menstrual cycles   . Joint pain of leg   . Major depression (HCC) 11/23/2015   No past surgical history on file. Family History  Problem Relation Age of Onset  . Diabetes Mother   . Arthritis Father   . Diabetes Father   . Heart disease Father   . Hypertension Father   . Stroke Father   . Hearing loss Sister   . Hypertension Brother   . Hypertension Paternal Grandfather    Social History  Substance Use Topics  . Smoking status: Never Smoker  . Smokeless tobacco: Not on file  . Alcohol use No    Interim medical history since last visit reviewed. Allergies and medications reviewed  Review of Systems Per HPI unless specifically indicated above     Objective:    Pulse (!) 117   Temp 98.4 F (36.9 C) (Oral)   Resp 18   Ht 5\' 8"  (1.727 m)   Wt 238 lb 11.2 oz (108.3 kg)   SpO2 98%   BMI  36.29 kg/m   Wt Readings from Last 3 Encounters:  01/21/16 238 lb 11.2 oz (108.3 kg)  12/30/15 235 lb (106.6 kg)  12/10/15 238 lb 6.4 oz (108.1 kg)    Physical Exam  Constitutional: She appears well-developed and well-nourished. No distress.  obese  HENT:  Head: Normocephalic and atraumatic.  Eyes: EOM are normal. No scleral icterus.  Neck: No thyromegaly present.  Cardiovascular: Normal rate, regular rhythm and normal heart sounds.   No murmur heard. Pulmonary/Chest: Effort normal and breath sounds normal. No respiratory distress. She has no wheezes.  Abdominal: Soft. Bowel sounds are normal. She exhibits no distension.  Musculoskeletal: Normal range of motion. She exhibits no edema.  Neurological: She is alert. She exhibits normal muscle tone.  Skin: Skin is warm and dry. She is not diaphoretic. No pallor.  Psychiatric: She has a normal mood and affect. Her behavior is normal. Judgment and thought content normal.    Diabetic Foot Form - Detailed   Diabetic Foot Exam - detailed Diabetic Foot exam was performed with the following findings:  Yes 01/21/2016  9:55 PM  Visual Foot Exam completed.:  Yes  Are the toenails ingrown?:  No Normal Range of Motion:  Yes Pulse Foot Exam completed.:  Yes  Right Dorsalis Pedis:  Present Left Dorsalis Pedis:  Present  Sensory Foot Exam Completed.:  Yes Swelling:  No Semmes-Weinstein Monofilament Test R Site 1-Great Toe:  Pos L Site 1-Great Toe:  Pos  R Site 4:  Pos L Site 4:  Pos  R Site 5:  Pos L Site 5:  Pos        Results for orders placed or performed during the hospital encounter of 12/30/15  Wet prep, genital  Result Value Ref Range   Yeast Wet Prep HPF POC NONE SEEN NONE SEEN   Trich, Wet Prep NONE SEEN NONE SEEN   Clue Cells Wet Prep HPF POC NONE SEEN NONE SEEN   WBC, Wet Prep HPF POC NONE SEEN NONE SEEN   Sperm NONE SEEN   Chlamydia/NGC rt PCR ([facility] only)  Result Value Ref Range   Specimen source GC/Chlam ENDOCERVICAL    Chlamydia Tr NOT DETECTED NOT DETECTED   N gonorrhoeae NOT DETECTED NOT DETECTED  Lipase, blood  Result Value Ref Range   Lipase 19 11 - 51 U/L  Comprehensive metabolic panel  Result Value Ref Range   Sodium 139 135 - 145 mmol/L   Potassium 3.8 3.5 - 5.1 mmol/L   Chloride 106 101 - 111 mmol/L   CO2 25 22 - 32 mmol/L   Glucose, Bld 89 65 - 99 mg/dL   BUN 9 6 - 20 mg/dL   Creatinine, Ser 1.61 0.44 - 1.00 mg/dL   Calcium 9.4 8.9 - 09.6 mg/dL   Total Protein 7.3 6.5 - 8.1 g/dL   Albumin 4.1 3.5 - 5.0 g/dL   AST 23 15 - 41 U/L   ALT 26 14 - 54 U/L   Alkaline Phosphatase 53 38 - 126 U/L   Total Bilirubin 0.4 0.3 - 1.2 mg/dL   GFR calc non Af Amer >60 >60 mL/min   GFR calc Af Amer >60 >60 mL/min   Anion gap 8 5 - 15  CBC  Result Value Ref Range   WBC 7.6 3.6 - 11.0 K/uL   RBC 4.89 3.80 - 5.20 MIL/uL   Hemoglobin 13.8 12.0 - 16.0 g/dL   HCT 04.5 40.9 - 81.1 %   MCV 84.8 80.0 - 100.0 fL  MCH 28.2 26.0 - 34.0 pg    MCHC 33.2 32.0 - 36.0 g/dL   RDW 16.115.3 (H) 09.611.5 - 04.514.5 %   Platelets 243 150 - 440 K/uL  Urinalysis complete, with microscopic  Result Value Ref Range   Color, Urine YELLOW (A) YELLOW   APPearance HAZY (A) CLEAR   Glucose, UA NEGATIVE NEGATIVE mg/dL   Bilirubin Urine NEGATIVE NEGATIVE   Ketones, ur NEGATIVE NEGATIVE mg/dL   Specific Gravity, Urine 1.024 1.005 - 1.030   Hgb urine dipstick NEGATIVE NEGATIVE   pH 5.0 5.0 - 8.0   Protein, ur 30 (A) NEGATIVE mg/dL   Nitrite NEGATIVE NEGATIVE   Leukocytes, UA NEGATIVE NEGATIVE   RBC / HPF 0-5 0 - 5 RBC/hpf   WBC, UA 0-5 0 - 5 WBC/hpf   Bacteria, UA RARE (A) NONE SEEN   Squamous Epithelial / LPF 6-30 (A) NONE SEEN   Mucous PRESENT   Pregnancy, urine POC  Result Value Ref Range   Preg Test, Ur NEGATIVE NEGATIVE      Assessment & Plan:   Problem List Items Addressed This Visit      Digestive   Cholelithiasis without obstruction    Patient considering surgery        Endocrine   Diabetes mellitus type 2, controlled (HCC) (Chronic)    Check A1c next week; foot exam by MD today        Other   Vitamin D deficiency    Check next week with other labs      Major depression (HCC)    Start sertraline, f/u in 4 weeks; call if feeling worse, or seek help if needed      Relevant Medications   sertraline (ZOLOFT) 50 MG tablet   Dysuria    Check urine today      Relevant Orders   Urinalysis w microscopic + reflex cultur   Contact with and suspected exposure to infections with predominantly sexual mode of transmission    Check gc/chl      Relevant Orders   GC/Chlamydia Probe Amp    Other Visit Diagnoses   None.     Follow up plan: Return in about 7 days (around 01/28/2016) for fasting labs (just labs); return 4 weeks with Dr. Sherie DonLada.  An after-visit summary was printed and given to the patient at check-out.  Please see the patient instructions which may contain other information and recommendations beyond what is mentioned  above in the assessment and plan.  Meds ordered this encounter  Medications  . sertraline (ZOLOFT) 50 MG tablet    Sig: One-half of a pill by mouth daily x 8 days, then one whole pill daily    Dispense:  26 tablet    Refill:  0  . levonorgestrel-ethinyl estradiol (SEASONALE,INTROVALE,JOLESSA) 0.15-0.03 MG tablet    Sig: Take 1 tablet by mouth daily.    Dispense:  1 Package    Refill:  4    Orders Placed This Encounter  Procedures  . GC/Chlamydia Probe Amp  . Urinalysis w microscopic + reflex cultur

## 2016-01-21 NOTE — Patient Instructions (Addendum)
Stop the current birth control pill Start the new one on Sunday, then take for 3 months and have one period every 3 months (4 times a year) Start the sertraline for mood and anxiety Return next Thursday or later for fasting labs  Check out the information at familydoctor.org entitled "Nutrition for Weight Loss: What You Need to Know about Fad Diets" Try to lose between 1-2 pounds per week by taking in fewer calories and burning off more calories You can succeed by limiting portions, limiting foods dense in calories and fat, becoming more active, and drinking 8 glasses of water a day (64 ounces) Don't skip meals, especially breakfast, as skipping meals may alter your metabolism Do not use over-the-counter weight loss pills or gimmicks that claim rapid weight loss A healthy BMI (or body mass index) is between 18.5 and 24.9 You can calculate your ideal BMI at the NIH website [employer]huhttp://www.nhlbi.nih.gov/health/educational/lose_wt/BMI/bmicalc.htm  Your goal blood pressure is less than 130 mmHg on top. Try to follow the DASH guidelines (DASH stands for Dietary Approaches to Stop Hypertension) Try to limit the sodium in your diet.  Ideally, consume less than 1.5 grams (less than 1,500mg ) per day. Do not add salt when cooking or at the table.  Check the sodium amount on labels when shopping, and choose items lower in sodium when given a choice. Avoid or limit foods that already contain a lot of sodium. Eat a diet rich in fruits and vegetables and whole grains.

## 2016-01-21 NOTE — Assessment & Plan Note (Signed)
Check gc/chl

## 2016-01-21 NOTE — Assessment & Plan Note (Signed)
Patient considering surgery

## 2016-01-21 NOTE — Assessment & Plan Note (Signed)
Check urine today 

## 2016-01-21 NOTE — Assessment & Plan Note (Signed)
Check A1c next week; foot exam by MD today

## 2016-01-22 LAB — URINALYSIS W MICROSCOPIC + REFLEX CULTURE
BILIRUBIN URINE: NEGATIVE
Bacteria, UA: NONE SEEN [HPF]
CRYSTALS: NONE SEEN [HPF]
Casts: NONE SEEN [LPF]
GLUCOSE, UA: NEGATIVE
KETONES UR: NEGATIVE
LEUKOCYTES UA: NEGATIVE
Nitrite: NEGATIVE
PH: 6 (ref 5.0–8.0)
RBC / HPF: NONE SEEN RBC/HPF (ref <=2)
SPECIFIC GRAVITY, URINE: 1.027 (ref 1.001–1.035)
Squamous Epithelial / LPF: NONE SEEN [HPF] (ref <=5)
WBC UA: NONE SEEN WBC/HPF (ref <=5)
Yeast: NONE SEEN [HPF]

## 2016-01-22 LAB — GC/CHLAMYDIA PROBE AMP
CT Probe RNA: NOT DETECTED
GC Probe RNA: NOT DETECTED

## 2016-01-27 ENCOUNTER — Ambulatory Visit: Payer: Medicaid Other | Admitting: Family Medicine

## 2016-01-27 ENCOUNTER — Telehealth: Payer: Self-pay | Admitting: Family Medicine

## 2016-01-28 ENCOUNTER — Other Ambulatory Visit: Payer: Medicaid Other

## 2016-01-28 ENCOUNTER — Other Ambulatory Visit: Payer: Self-pay

## 2016-01-28 DIAGNOSIS — IMO0001 Reserved for inherently not codable concepts without codable children: Secondary | ICD-10-CM

## 2016-01-28 DIAGNOSIS — E669 Obesity, unspecified: Secondary | ICD-10-CM

## 2016-01-28 DIAGNOSIS — E119 Type 2 diabetes mellitus without complications: Secondary | ICD-10-CM

## 2016-01-28 DIAGNOSIS — Z79899 Other long term (current) drug therapy: Secondary | ICD-10-CM

## 2016-01-28 LAB — COMPLETE METABOLIC PANEL WITH GFR
ALBUMIN: 4.4 g/dL (ref 3.6–5.1)
ALK PHOS: 43 U/L (ref 33–115)
ALT: 20 U/L (ref 6–29)
AST: 16 U/L (ref 10–30)
BUN: 8 mg/dL (ref 7–25)
CO2: 25 mmol/L (ref 20–31)
Calcium: 9.6 mg/dL (ref 8.6–10.2)
Chloride: 102 mmol/L (ref 98–110)
Creat: 0.71 mg/dL (ref 0.50–1.10)
GLUCOSE: 106 mg/dL — AB (ref 65–99)
POTASSIUM: 4 mmol/L (ref 3.5–5.3)
SODIUM: 139 mmol/L (ref 135–146)
Total Bilirubin: 0.4 mg/dL (ref 0.2–1.2)
Total Protein: 7.3 g/dL (ref 6.1–8.1)

## 2016-01-28 LAB — LIPID PANEL
CHOL/HDL RATIO: 2.4 ratio (ref <=5.0)
Cholesterol: 144 mg/dL (ref 125–200)
HDL: 61 mg/dL (ref >=46)
LDL CALC: 64 mg/dL (ref <130)
Triglycerides: 95 mg/dL (ref <150)
VLDL: 19 mg/dL (ref <30)

## 2016-01-28 LAB — HEMOGLOBIN A1C
Hgb A1c MFr Bld: 7.3 % — ABNORMAL HIGH (ref <5.7)
MEAN PLASMA GLUCOSE: 163 mg/dL

## 2016-01-30 ENCOUNTER — Other Ambulatory Visit: Payer: Self-pay | Admitting: Family Medicine

## 2016-01-30 DIAGNOSIS — R809 Proteinuria, unspecified: Secondary | ICD-10-CM | POA: Insufficient documentation

## 2016-01-30 DIAGNOSIS — R3129 Other microscopic hematuria: Secondary | ICD-10-CM

## 2016-01-30 MED ORDER — METFORMIN HCL ER 500 MG PO TB24: 1000.0000 mg | ORAL_TABLET | Freq: Every day | ORAL | 5 refills | Status: DC

## 2016-01-30 NOTE — Progress Notes (Signed)
Orders entered for recheck urine and 24 hour protein

## 2016-01-30 NOTE — Assessment & Plan Note (Signed)
Order 24 hour urine

## 2016-01-30 NOTE — Assessment & Plan Note (Signed)
Recheck one week after period finishes

## 2016-01-30 NOTE — Telephone Encounter (Signed)
Please let pt know that her kidney function is fine Her liver function is fine Her cholesterol panel is beautiful Her A1c is a little above target, so let's have her work on healthier eating, more activity, modest weight loss, and increase metformin from one pill daily to two pills daily Recheck labs and see me for diabetes visit in 3 months

## 2016-02-01 ENCOUNTER — Other Ambulatory Visit: Payer: Self-pay

## 2016-02-01 DIAGNOSIS — R809 Proteinuria, unspecified: Secondary | ICD-10-CM

## 2016-02-01 DIAGNOSIS — R3129 Other microscopic hematuria: Secondary | ICD-10-CM

## 2016-02-01 NOTE — Telephone Encounter (Signed)
Pot notified

## 2016-02-02 ENCOUNTER — Other Ambulatory Visit: Payer: Self-pay | Admitting: Family Medicine

## 2016-02-02 LAB — URINALYSIS W MICROSCOPIC + REFLEX CULTURE
Bacteria, UA: NONE SEEN [HPF]
Bilirubin Urine: NEGATIVE
CASTS: NONE SEEN [LPF]
Crystals: NONE SEEN [HPF]
GLUCOSE, UA: NEGATIVE
Hgb urine dipstick: NEGATIVE
KETONES UR: NEGATIVE
LEUKOCYTES UA: NEGATIVE
NITRITE: NEGATIVE
PH: 6.5 (ref 5.0–8.0)
SPECIFIC GRAVITY, URINE: 1.024 (ref 1.001–1.035)
Yeast: NONE SEEN [HPF]

## 2016-02-03 LAB — PROTEIN, URINE, 24 HOUR
PROTEIN, URINE: 24 mg/dL (ref 5–24)
Protein, 24H Urine: 168 mg/24 h — ABNORMAL HIGH (ref <150)

## 2016-02-04 ENCOUNTER — Other Ambulatory Visit: Payer: Self-pay | Admitting: Family Medicine

## 2016-02-04 ENCOUNTER — Telehealth: Payer: Self-pay | Admitting: Family Medicine

## 2016-02-04 DIAGNOSIS — R809 Proteinuria, unspecified: Secondary | ICD-10-CM

## 2016-02-04 MED ORDER — LISINOPRIL 2.5 MG PO TABS: 2.5000 mg | ORAL_TABLET | Freq: Every day | ORAL | 0 refills | Status: DC

## 2016-02-04 NOTE — Assessment & Plan Note (Signed)
Start ACE-I, recheck urine microalb and BMP in 4 weeks

## 2016-02-04 NOTE — Progress Notes (Signed)
Orders for bmp and urine in 4 weeks after starting ACE-I

## 2016-02-05 ENCOUNTER — Telehealth: Payer: Self-pay | Admitting: Family Medicine

## 2016-02-05 LAB — PROTEIN, URINE, 24 HOUR

## 2016-02-05 NOTE — Telephone Encounter (Signed)
My gut feeling (no pun intended) is that it's the metformin Have her hold the metformin for a week, see if her symptoms resolve Then she start back on the metformin If symptoms return, call us back

## 2016-02-05 NOTE — Telephone Encounter (Signed)
Pt.notified

## 2016-02-05 NOTE — Telephone Encounter (Signed)
I returned her call She will get the lisinopril I explained that the lisinopril is to protect the kidneys She'll stop the metformin for a week and then restart if needed Call if needed

## 2016-02-19 ENCOUNTER — Encounter: Payer: Self-pay | Admitting: Family Medicine

## 2016-02-19 ENCOUNTER — Ambulatory Visit (INDEPENDENT_AMBULATORY_CARE_PROVIDER_SITE_OTHER): Payer: Medicaid Other | Admitting: Family Medicine

## 2016-02-19 DIAGNOSIS — E1165 Type 2 diabetes mellitus with hyperglycemia: Secondary | ICD-10-CM

## 2016-02-19 DIAGNOSIS — N898 Other specified noninflammatory disorders of vagina: Secondary | ICD-10-CM | POA: Diagnosis not present

## 2016-02-19 DIAGNOSIS — Z202 Contact with and (suspected) exposure to infections with a predominantly sexual mode of transmission: Secondary | ICD-10-CM | POA: Diagnosis not present

## 2016-02-19 NOTE — Progress Notes (Signed)
BP 128/74   Pulse 100   Temp 98.9 F (37.2 C) (Oral)   Resp 14   Wt 241 lb (109.3 kg)   LMP 12/31/2015   SpO2 98%   BMI 36.64 kg/m    Subjective:    Patient ID: Penny Gonzalez, female    DOB: 02/07/1996, 20 y.o.   MRN: [id number]  HPI: Penny Gonzalez is a 20 y.o. female  Chief Complaint  Patient presents with  . Follow-up   She was having issues with her stomach, but wants to restart her metformin now; she has diabetes; no dry mouth, no blurred vision; avoiding soft drinks; last night had salad and water; cut out the juice; gets wheat bread instead of white bread, not a bread eater; no sores on the feet; A1c was 7.3 on August 17th  Had some protein in her urine; had this before during pregnancy; had pre-eclampsia; more fish and chicken than red meat; drinking more water now; no foam or bubbles in the urine; urine is clear; taking lisinopril every day, no problems  Still taking birth control, 3 month cycle  She had not had sex in months; she went to [place] this weekend, had intercourse, used protection but the condom popped; she took a shower and wiping down there and was burning; no burning with urination; just burning with wiping and showering; going on for a couple of days; no sores or blisters; no discharge; no pelvic pain; just gets occasional cramps from the medicine; she wants to be checked; she is not worried about pregnancy; on 3 month OCP  Depression screen [facility]HQ 2/9 02/19/2016 01/21/2016 10/27/2015 02/18/2015 11/26/2014  Decreased Interest 0 1 2 0 1  Down, Depressed, Hopeless 0 1 2 0 1  PHQ - 2 Score 0 2 4 0 2  Altered sleeping - 3 1 - 1  Tired, decreased energy - 3 1 - 3  Change in appetite - 1 3 - 0  Feeling bad or failure about yourself  - 1 2 - 1  Trouble concentrating - 1 1 - 0  Moving slowly or fidgety/restless - 1 0 - 0  Suicidal thoughts - 0 1 - 0  PHQ-9 Score - 12 13 - 7  Difficult doing work/chores - Somewhat difficult Somewhat difficult - Somewhat  difficult   Relevant past medical, surgical, family and social history reviewed Past Medical History:  Diagnosis Date  . Anemia, iron deficiency, inadequate dietary intake 12/01/2014  . Depression    patient has not been officially diagnosed but has symptoms  . Diabetes mellitus without complication (HCC)   . Irregular periods/menstrual cycles   . Joint pain of leg   . Major depression (HCC) 11/23/2015   Social History  Substance Use Topics  . Smoking status: Never Smoker  . Smokeless tobacco: Not on file  . Alcohol use No   Interim medical history since last visit reviewed. Allergies and medications reviewed  Review of Systems Per HPI unless specifically indicated above     Objective:    BP 128/74   Pulse 100   Temp 98.9 F (37.2 C) (Oral)   Resp 14   Wt 241 lb (109.3 kg)   LMP 12/31/2015   SpO2 98%   BMI 36.64 kg/m   Wt Readings from Last 3 Encounters:  02/19/16 241 lb (109.3 kg)  01/21/16 238 lb 11.2 oz (108.3 kg)  12/30/15 235 lb (106.6 kg)    Physical Exam  Constitutional: She appears well-developed and well-nourished.  obese  Cardiovascular: Tachycardia present.   Borderline tachycardic  Pulmonary/Chest: Effort normal.  Genitourinary: There is no rash, tenderness or injury on the right labia. There is no rash, tenderness or injury on the left labia. Uterus is not enlarged. Cervix exhibits no motion tenderness, no discharge and no friability. Right adnexum displays no mass, no tenderness and no fullness. Left adnexum displays no mass, no tenderness and no fullness. No erythema or bleeding in the vagina. No signs of injury around the vagina. Vaginal discharge (thin whitish discharge) found.  Musculoskeletal:       Right wrist: She exhibits normal range of motion, no tenderness, no swelling, no effusion, no crepitus and no deformity.  Psychiatric: Her mood appears not anxious. She does not exhibit a depressed mood.  Good eye contact examiner      Assessment &  Plan:   Problem List Items Addressed This Visit      Endocrine   Diabetes mellitus type 2, controlled (HCC) (Chronic)    Encouraged healthy eating; limit white bread, white rice, sugary drinks, etc; next A1c due on or after Nov 18th        Other   Vaginal discharge    Scant discharge; will check wet prep      Contact with and suspected exposure to infections with predominantly sexual mode of transmission    Check labs      Relevant Orders   Urinalysis, Routine w reflex microscopic (Completed)   GC/Chlamydia Probe Amp   WET PREP FOR TRICH, YEAST, CLUE    Other Visit Diagnoses   None.      Follow up plan: No Follow-up on file.  An after-visit summary was printed and given to the patient at check-out.  Please see the patient instructions which may contain other information and recommendations beyond what is mentioned above in the assessment and plan.  No orders of the defined types were placed in this encounter.   Orders Placed This Encounter  Procedures  . GC/Chlamydia Probe Amp  . WET PREP FOR TRICH, YEAST, CLUE  . Urinalysis, Routine w reflex microscopic  . Specimen status report

## 2016-02-19 NOTE — Assessment & Plan Note (Signed)
Check labs 

## 2016-02-21 DIAGNOSIS — N898 Other specified noninflammatory disorders of vagina: Secondary | ICD-10-CM | POA: Insufficient documentation

## 2016-02-21 NOTE — Assessment & Plan Note (Signed)
Scant discharge; will check wet prep

## 2016-02-21 NOTE — Assessment & Plan Note (Signed)
Encouraged healthy eating; limit white bread, white rice, sugary drinks, etc; next A1c due on or after Nov 18th

## 2016-02-22 ENCOUNTER — Ambulatory Visit (INDEPENDENT_AMBULATORY_CARE_PROVIDER_SITE_OTHER): Payer: Medicaid Other | Admitting: Family Medicine

## 2016-02-22 ENCOUNTER — Other Ambulatory Visit: Payer: Self-pay | Admitting: Family Medicine

## 2016-02-22 ENCOUNTER — Other Ambulatory Visit: Payer: Self-pay

## 2016-02-22 DIAGNOSIS — R300 Dysuria: Secondary | ICD-10-CM

## 2016-02-22 LAB — GC/CHLAMYDIA PROBE AMP
Chlamydia trachomatis, NAA: NEGATIVE
Neisseria gonorrhoeae by PCR: NEGATIVE

## 2016-02-22 LAB — PLEASE NOTE

## 2016-02-22 LAB — URINALYSIS, ROUTINE W REFLEX MICROSCOPIC

## 2016-02-22 LAB — SPECIMEN STATUS REPORT

## 2016-02-23 ENCOUNTER — Telehealth: Payer: Self-pay | Admitting: Family Medicine

## 2016-02-23 LAB — WET PREP FOR TRICH, YEAST, CLUE

## 2016-02-23 LAB — URINALYSIS, ROUTINE W REFLEX MICROSCOPIC
Bilirubin, UA: NEGATIVE
GLUCOSE, UA: NEGATIVE
Ketones, UA: NEGATIVE
Leukocytes, UA: NEGATIVE
NITRITE UA: NEGATIVE
RBC, UA: NEGATIVE
Specific Gravity, UA: >=1.03 — AB (ref 1.005–1.030)
Urobilinogen, Ur: 1 mg/dL (ref 0.2–1.0)
pH, UA: 7 (ref 5.0–7.5)

## 2016-02-23 LAB — MICROSCOPIC EXAMINATION
Casts: NONE SEEN /lpf
Epithelial Cells (non renal): >10 /hpf — AB (ref 0–10)

## 2016-02-23 LAB — VAGINITIS/VAGINOSIS, DNA PROBE
CANDIDA SPECIES: NEGATIVE
GARDNERELLA VAGINALIS: NEGATIVE
TRICHOMONAS VAG: NEGATIVE

## 2016-02-23 NOTE — Telephone Encounter (Signed)
I talked to patient; no evidence of infection She stopped the metformin, tapering back up

## 2016-02-29 ENCOUNTER — Telehealth: Payer: Self-pay | Admitting: Family Medicine

## 2016-02-29 NOTE — Telephone Encounter (Signed)
Pt.notified

## 2016-02-29 NOTE — Telephone Encounter (Signed)
I would ask her to give it a few more days to see if flow subsides; if not, please call back on Thursday

## 2016-02-29 NOTE — Telephone Encounter (Signed)
Pt called states has been on new birth control for over a month.  She is suppose to have period every 3 months.  Pt states has been bleeding for over 1 week now, she states it is medium flow. Please advise

## 2016-03-03 ENCOUNTER — Ambulatory Visit: Payer: Medicaid Other

## 2016-03-14 ENCOUNTER — Other Ambulatory Visit: Payer: Self-pay | Admitting: Family Medicine

## 2016-03-14 NOTE — Telephone Encounter (Signed)
August 2017 Cr and K+ reviewed; Rx approved

## 2016-03-15 ENCOUNTER — Telehealth: Payer: Self-pay | Admitting: Family Medicine

## 2016-03-15 NOTE — Telephone Encounter (Signed)
PT WAS CALLING FOR REFILL BUT SEEN WHERE ONE HAD ALREADY BEEN DONE.

## 2016-03-21 NOTE — Telephone Encounter (Signed)
Please call patient regarding results.  Wouldn't really tell me

## 2016-03-23 NOTE — Telephone Encounter (Signed)
Please get more information or see if you can help her; I'll be out this afternoon and I don't see any recent results Thank you

## 2016-03-23 NOTE — Telephone Encounter (Signed)
Ok I spoke with  Patient and she states is having foot pain and numbness and waking up with abdominal pain and back pain.  I told her if she is having multiple problems to schedule an appt and could discuss then.

## 2016-03-24 ENCOUNTER — Encounter: Payer: Self-pay | Admitting: Family Medicine

## 2016-03-24 ENCOUNTER — Ambulatory Visit (INDEPENDENT_AMBULATORY_CARE_PROVIDER_SITE_OTHER): Payer: Medicaid Other | Admitting: Family Medicine

## 2016-03-24 DIAGNOSIS — I10 Essential (primary) hypertension: Secondary | ICD-10-CM | POA: Diagnosis not present

## 2016-03-24 DIAGNOSIS — K8012 Calculus of gallbladder with acute and chronic cholecystitis without obstruction: Secondary | ICD-10-CM

## 2016-03-24 DIAGNOSIS — M79672 Pain in left foot: Secondary | ICD-10-CM

## 2016-03-24 DIAGNOSIS — Z5181 Encounter for therapeutic drug level monitoring: Secondary | ICD-10-CM

## 2016-03-24 DIAGNOSIS — R300 Dysuria: Secondary | ICD-10-CM | POA: Diagnosis not present

## 2016-03-24 DIAGNOSIS — M79671 Pain in right foot: Secondary | ICD-10-CM

## 2016-03-24 DIAGNOSIS — R808 Other proteinuria: Secondary | ICD-10-CM

## 2016-03-24 MED ORDER — LISINOPRIL 5 MG PO TABS: 5.0000 mg | ORAL_TABLET | Freq: Every day | ORAL | 0 refills | Status: DC

## 2016-03-24 NOTE — Assessment & Plan Note (Signed)
Check urine at patient's request

## 2016-03-24 NOTE — Assessment & Plan Note (Signed)
Check urine; increase ACE-I; very important to not get pregnant

## 2016-03-24 NOTE — Progress Notes (Signed)
BP (!) 138/98 (BP Location: Left Arm, Patient Position: Sitting, Cuff Size: Normal)   Pulse 97   Temp 99 F (37.2 C) (Oral)   Resp 16   Wt 241 lb 9 oz (109.6 kg)   LMP 02/17/2016   SpO2 94%   BMI 36.73 kg/m    Subjective:    Patient ID: Penny Gonzalez, female    DOB: 01/12/1996, 20 y.o.   MRN: [id number]  HPI: Penny KnucklesSamantha Closson is a 20 y.o. female  Chief Complaint  Patient presents with  . Abdominal Pain  . Foot Pain   She has been on "shipment" for a month; involves a lot of standing and walking; she had been sitting for 4-5 months; now moving around more; her feet have been tingling, terrible; hurting and aching; wearing tennis shoes; does have protective mats; mother will massage her feet; no medicine; no whirlpool tubs; she has had to wear supports; she used to see a foot doctor, and they gave her foot inserts to wear, but only one pair; just a one time thing  She knows her gallbladder has to be removed; she went to talk to the lady at [facility] about [facility]  Her BP is high; her job is stressing her out; she is going to look for another job  She wants to have her urine checked; she shaves and has razor bumps and has changed her soap; she uses regular   [facility] 03/24/2016 02/19/2016 01/21/2016 10/27/2015 02/18/2015  Decreased Interest 0 0 1 2 0  Down, Depressed, Hopeless 0 0 1 2 0  PHQ - 2 Score 0 0 2 4 0  Altered sleeping - - 3 1 -  Tired, decreased energy - - 3 1 -  Change in appetite - - 1 3 -  Feeling bad or failure about yourself  - - 1 2 -  Trouble concentrating - - 1 1 -  Moving slowly or fidgety/restless - - 1 0 -  Suicidal thoughts - - 0 1 -  PHQ-9 Score - - 12 13 -  Difficult doing work/chores - - Somewhat difficult Somewhat difficult -   Relevant past medical, surgical, family and social history reviewed Past Medical History:  Diagnosis Date  . Anemia, iron deficiency, inadequate dietary intake 12/01/2014  . Depression     patient has not been officially diagnosed but has symptoms  . Diabetes mellitus without complication (HCC)   . Essential hypertension, benign 03/31/2016  . Essential hypertension, benign 03/31/2016  . Irregular periods/menstrual cycles   . Joint pain of leg   . Major depression 11/23/2015   History reviewed. No pertinent surgical history. Family History  Problem Relation Age of Onset  . Diabetes Mother   . Arthritis Father   . Diabetes Father   . Heart disease Father   . Hypertension Father   . Stroke Father   . Hearing loss Sister   . Hypertension Brother   . Hypertension Paternal Grandfather    Social History  Substance Use Topics  . Smoking status: Never Smoker  . Smokeless tobacco: Not on file  . Alcohol use No    Interim medical history since last visit reviewed. Allergies and medications reviewed  Review of Systems Per HPI unless specifically indicated above     Objective:    BP (!) 138/98 (BP Location: Left Arm, Patient Position: Sitting, Cuff Size: Normal)   Pulse 97   Temp 99 F (37.2 C) (Oral)   Resp 16  Wt 241 lb 9 oz (109.6 kg)   LMP 02/17/2016   SpO2 94%   BMI 36.73 kg/m   Wt Readings from Last 3 Encounters:  03/24/16 241 lb 9 oz (109.6 kg)  02/19/16 241 lb (109.3 kg)  01/21/16 238 lb 11.2 oz (108.3 kg)    Physical Exam  Constitutional: She appears well-developed and well-nourished. No distress.  obese  Eyes: EOM are normal. No scleral icterus.  Neck: No thyromegaly present.  Cardiovascular: Normal rate.   Pulmonary/Chest: Effort normal.  Abdominal: She exhibits no distension. There is no tenderness.  Musculoskeletal:       Right ankle: She exhibits normal range of motion, no swelling and no deformity.       Left ankle: She exhibits normal range of motion, no swelling and no deformity.       Right lower leg: She exhibits no swelling and no edema.       Left lower leg: She exhibits no swelling and no edema.       Right foot: There is  tenderness. There is normal range of motion, no bony tenderness, no swelling, no crepitus and no deformity.       Left foot: There is tenderness. There is normal range of motion, no bony tenderness, no swelling, no crepitus and no deformity.  Skin: No pallor.  Psychiatric: She has a normal mood and affect. Her behavior is normal. Judgment and thought content normal. Her mood appears not anxious. She does not exhibit a depressed mood.   Diabetic Foot Form - Detailed   Diabetic Foot Exam - detailed Diabetic Foot exam was performed with the following findings:  Yes 03/24/2016  5:59 PM  Visual Foot Exam completed.:  Yes  Are the toenails ingrown?:  No Normal Range of Motion:  Yes Pulse Foot Exam completed.:  Yes  Right Dorsalis Pedis:  Present Left Dorsalis Pedis:  Present  Sensory Foot Exam Completed.:  Yes Swelling:  No Semmes-Weinstein Monofilament Test R Site 1-Great Toe:  Pos L Site 1-Great Toe:  Pos  R Site 4:  Pos L Site 4:  Pos  R Site 5:  Pos L Site 5:  Pos          Assessment & Plan:   Problem List Items Addressed This Visit      Cardiovascular and Mediastinum   Essential hypertension, benign    Uncontrolled; increase ACE-I; very important to not get pregnant while on this medicine; work on weight loss, healthier eating; return in two weeks for BMP and BP check; try to follow DASH guidelines; see avs      Relevant Medications   lisinopril (PRINIVIL,ZESTRIL) 5 MG tablet   Other Relevant Orders   Basic metabolic panel     Digestive   Cholelithiasis    Patient plans to have surgery; to ER if symptoms worsen before then        Other   Proteinuria    Check urine; increase ACE-I; very important to not get pregnant      Medication monitoring encounter    Check K+ and Cr in two weeks after increase in the ACE-I      Relevant Orders   Basic metabolic panel   Foot pain, bilateral    I think this is exacerbated by her obesity; encouraged weight loss; ten minute  breaks to elevate legs, get off of feet twice during eight hour shift; supportive shoes, mat for floor; massage; I do not feel anything like Morton's neuroma, and exam/hx  note consistent with plantar fasciitis or achilles tendonitis; too young to suspect significant arthritis in the feet; treat symptomatically and give me update      Dysuria    Check urine at patient's request      Relevant Orders   Urinalysis w microscopic + reflex cultur (Completed)    Other Visit Diagnoses   None.      Follow up plan: Return in about 2 weeks (around 04/07/2016) for blood pressure, labs.  An after-visit summary was printed and given to the patient at check-out.  Please see the patient instructions which may contain other information and recommendations beyond what is mentioned above in the assessment and plan.  Meds ordered this encounter  Medications  . lisinopril (PRINIVIL,ZESTRIL) 5 MG tablet    Sig: Take 1 tablet (5 mg total) by mouth daily.    Dispense:  30 tablet    Refill:  0    Orders Placed This Encounter  Procedures  . Urinalysis w microscopic + reflex cultur  . Basic metabolic panel

## 2016-03-24 NOTE — Patient Instructions (Addendum)
I'll write a letter for you to have two ten-minute breaks during your 8 hour shifts Wear good supportive tennis shoes Work on weight loss Check out the information at [email or web address] entitled "Nutrition for Weight Loss: What You Need to Know about Fad Diets" Try to lose between 1-2 pounds per week by taking in fewer calories and burning off more calories You can succeed by limiting portions, limiting foods dense in calories and fat, becoming more active, and drinking 8 glasses of water a day (64 ounces) Don't skip meals, especially breakfast, as skipping meals may alter your metabolism Do not use over-the-counter weight loss pills or gimmicks that claim rapid weight loss A healthy BMI (or body mass index) is between 18.5 and 24.9 You can calculate your ideal BMI at the NIH website [employer]  Increase the lisinopril to 5 mg daily Your goal blood pressure is less than 130 mmHg on top. Try to follow the DASH guidelines (DASH stands for Dietary Approaches to Stop Hypertension) Try to limit the sodium in your diet.  Ideally, consume less than 1.5 grams (less than 1,500mg ) per day. Do not add salt when cooking or at the table.  Check the sodium amount on labels when shopping, and choose items lower in sodium when given a choice. Avoid or limit foods that already contain a lot of sodium. Eat a diet rich in fruits and vegetables and whole grains.  DASH Eating Plan DASH stands for "Dietary Approaches to Stop Hypertension." The DASH eating plan is a healthy eating plan that has been shown to reduce high blood pressure (hypertension). Additional health benefits may include reducing the risk of type 2 diabetes mellitus, heart disease, and stroke. The DASH eating plan may also help with weight loss. WHAT DO I NEED TO KNOW ABOUT THE DASH EATING PLAN? For the DASH eating plan, you will follow these general guidelines:  Choose foods with a percent  daily value for sodium of less than 5% (as listed on the food label).  Use salt-free seasonings or herbs instead of table salt or sea salt.  Check with your health care provider or pharmacist before using salt substitutes.  Eat lower-sodium products, often labeled as "lower sodium" or "no salt added."  Eat fresh foods.  Eat more vegetables, fruits, and low-fat dairy products.  Choose whole grains. Look for the word "whole" as the first word in the ingredient list.  Choose fish and skinless chicken or [place] more often than red meat. Limit fish, poultry, and meat to 6 oz (170 g) each day.  Limit sweets, desserts, sugars, and sugary drinks.  Choose heart-healthy fats.  Limit cheese to 1 oz (28 g) per day.  Eat more home-cooked food and less restaurant, buffet, and fast food.  Limit fried foods.  Cook foods using methods other than frying.  Limit canned vegetables. If you do use them, rinse them well to decrease the sodium.  When eating at a restaurant, ask that your food be prepared with less salt, or no salt if possible. WHAT FOODS CAN I EAT? Seek help from a dietitian for individual calorie needs. Grains Whole grain or whole wheat bread. Brown rice. Whole grain or whole wheat pasta. Quinoa, bulgur, and whole grain cereals. Low-sodium cereals. Corn or whole wheat flour tortillas. Whole grain cornbread. Whole grain crackers. Low-sodium crackers. Vegetables Fresh or frozen vegetables (raw, steamed, roasted, or grilled). Low-sodium or reduced-sodium tomato and vegetable juices. Low-sodium or reduced-sodium tomato sauce and paste. Low-sodium or reduced-sodium canned vegetables.  Fruits  All fresh, canned (in natural juice), or frozen fruits. Meat and Other Protein Products Ground beef (85% or leaner), grass-fed beef, or beef trimmed of fat. Skinless chicken or [place]. Ground chicken or [place]. Pork trimmed of fat. All fish and seafood. Eggs. Dried beans, peas, or lentils. Unsalted  nuts and seeds. Unsalted canned beans. Dairy Low-fat dairy products, such as skim or 1% milk, 2% or reduced-fat cheeses, low-fat ricotta or cottage cheese, or plain low-fat yogurt. Low-sodium or reduced-sodium cheeses. Fats and Oils Tub margarines without trans fats. Light or reduced-fat mayonnaise and salad dressings (reduced sodium). Avocado. Safflower, olive, or canola oils. Natural peanut or almond butter. Other Unsalted popcorn and pretzels. The items listed above may not be a complete list of recommended foods or beverages. Contact your dietitian for more options. WHAT FOODS ARE NOT RECOMMENDED? Grains White bread. White pasta. White rice. Refined cornbread. Bagels and croissants. Crackers that contain trans fat. Vegetables Creamed or fried vegetables. Vegetables in a cheese sauce. Regular canned vegetables. Regular canned tomato sauce and paste. Regular tomato and vegetable juices. Fruits Dried fruits. Canned fruit in light or heavy syrup. Fruit juice. Meat and Other Protein Products Fatty cuts of meat. Ribs, chicken wings, bacon, sausage, bologna, salami, chitterlings, fatback, hot dogs, bratwurst, and packaged luncheon meats. Salted nuts and seeds. Canned beans with salt. Dairy Whole or 2% milk, cream, half-and-half, and cream cheese. Whole-fat or sweetened yogurt. Full-fat cheeses or blue cheese. Nondairy creamers and whipped toppings. Processed cheese, cheese spreads, or cheese curds. Condiments Onion and garlic salt, seasoned salt, table salt, and sea salt. Canned and packaged gravies. Worcestershire sauce. Tartar sauce. Barbecue sauce. Teriyaki sauce. Soy sauce, including reduced sodium. Steak sauce. Fish sauce. Oyster sauce. Cocktail sauce. Horseradish. Ketchup and mustard. Meat flavorings and tenderizers. Bouillon cubes. Hot sauce. Tabasco sauce. Marinades. Taco seasonings. Relishes. Fats and Oils Butter, stick margarine, lard, shortening, ghee, and bacon fat. Coconut, palm  kernel, or palm oils. Regular salad dressings. Other Pickles and olives. Salted popcorn and pretzels. The items listed above may not be a complete list of foods and beverages to avoid. Contact your dietitian for more information. WHERE CAN I FIND MORE INFORMATION? National Heart, Lung, and Blood Institute: [employer].itwww.nhlbi.nih.gov/health/health-topics/topics/dash/   This information is not intended to replace advice given to you by your health care provider. Make sure you discuss any questions you have with your health care provider.   Document Released: 05/19/2011 Document Revised: 06/20/2014 Document Reviewed: 04/03/2013 Elsevier Interactive Patient Education [employer]2016 Elsevier Inc.

## 2016-03-25 LAB — URINALYSIS W MICROSCOPIC + REFLEX CULTURE
BILIRUBIN URINE: NEGATIVE
CASTS: NONE SEEN [LPF]
CRYSTALS: NONE SEEN [HPF]
GLUCOSE, UA: NEGATIVE
HGB URINE DIPSTICK: NEGATIVE
Leukocytes, UA: NEGATIVE
NITRITE: NEGATIVE
Specific Gravity, Urine: 1.028 (ref 1.001–1.035)
Yeast: NONE SEEN [HPF]
pH: 6.5 (ref 5.0–8.0)

## 2016-03-28 ENCOUNTER — Telehealth: Payer: Self-pay | Admitting: Family Medicine

## 2016-03-28 DIAGNOSIS — K8012 Calculus of gallbladder with acute and chronic cholecystitis without obstruction: Secondary | ICD-10-CM

## 2016-03-28 NOTE — Assessment & Plan Note (Signed)
Refer to surgeon. 

## 2016-03-28 NOTE — Telephone Encounter (Signed)
Pt wants to know if she can get a referral to GI at [facility] for her Gall Bladder. She states she was supposed to go last year but did not schedule. Please advise.

## 2016-03-28 NOTE — Telephone Encounter (Signed)
I thought GI was taking care of referral too I just entered a new referral to [facility]

## 2016-03-29 NOTE — Telephone Encounter (Signed)
Pt.notified

## 2016-03-31 ENCOUNTER — Encounter: Payer: Self-pay | Admitting: Family Medicine

## 2016-03-31 DIAGNOSIS — M79671 Pain in right foot: Secondary | ICD-10-CM | POA: Insufficient documentation

## 2016-03-31 DIAGNOSIS — I10 Essential (primary) hypertension: Secondary | ICD-10-CM

## 2016-03-31 DIAGNOSIS — M79672 Pain in left foot: Secondary | ICD-10-CM

## 2016-03-31 HISTORY — DX: Essential (primary) hypertension: I10

## 2016-03-31 NOTE — Assessment & Plan Note (Addendum)
Uncontrolled; increase ACE-I; very important to not get pregnant while on this medicine; work on weight loss, healthier eating; return in two weeks for BMP and BP check; try to follow DASH guidelines; see avs

## 2016-03-31 NOTE — Assessment & Plan Note (Signed)
I think this is exacerbated by her obesity; encouraged weight loss; ten minute breaks to elevate legs, get off of feet twice during eight hour shift; supportive shoes, mat for floor; massage; I do not feel anything like Morton's neuroma, and exam/hx note consistent with plantar fasciitis or achilles tendonitis; too young to suspect significant arthritis in the feet; treat symptomatically and give me update

## 2016-03-31 NOTE — Assessment & Plan Note (Signed)
Check K+ and Cr in two weeks after increase in the ACE-I

## 2016-03-31 NOTE — Assessment & Plan Note (Signed)
Patient plans to have surgery; to ER if symptoms worsen before then

## 2016-04-08 ENCOUNTER — Encounter: Payer: Self-pay | Admitting: Family Medicine

## 2016-04-08 ENCOUNTER — Ambulatory Visit (INDEPENDENT_AMBULATORY_CARE_PROVIDER_SITE_OTHER): Payer: Medicaid Other | Admitting: Family Medicine

## 2016-04-08 DIAGNOSIS — I10 Essential (primary) hypertension: Secondary | ICD-10-CM | POA: Diagnosis not present

## 2016-04-08 DIAGNOSIS — R808 Other proteinuria: Secondary | ICD-10-CM | POA: Diagnosis not present

## 2016-04-08 NOTE — Patient Instructions (Signed)
Keep up the good work Avoid salt substitutes Your goal blood pressure is less than 130 mmHg on top. Try to follow the DASH guidelines (DASH stands for Dietary Approaches to Stop Hypertension) Try to limit the sodium in your diet.  Ideally, consume less than 1.5 grams (less than 1,500mg ) per day. Do not add salt when cooking or at the table.  Check the sodium amount on labels when shopping, and choose items lower in sodium when given a choice. Avoid or limit foods that already contain a lot of sodium. Eat a diet rich in fruits and vegetables and whole grains. Do not get pregnant on this medicine

## 2016-04-08 NOTE — Assessment & Plan Note (Signed)
Well-controlled today on the higher dose; do not get pregnant on this medicine; if any chance or intention of pregnancy, stop it and let us know right away

## 2016-04-08 NOTE — Assessment & Plan Note (Signed)
Improving on the ACE-I; do not get pregnant on this medicine; recheck labs

## 2016-04-08 NOTE — Progress Notes (Signed)
BP 128/78   Pulse 98   Temp 99.1 F (37.3 C) (Oral)   Resp 14   Wt 241 lb 8 oz (109.5 kg)   LMP 02/17/2016   SpO2 98%   BMI 36.72 kg/m    Subjective:    Patient ID: Penny Gonzalez, female    DOB: November 25, 1995, 20 y.o.   MRN: [id number]  HPI: Penny Gonzalez is a 20 y.o. female  Chief Complaint  Patient presents with  . Follow-up   Here for BP follow-up; last BP was 138/98 and today's reading shows systolic down 10 points and diastolic down 20 points; medicine was increased at last visit; using 50% less salt product (advised NOT to)  She is going to reduce her hours; less hours on the feet; feet are still hurting; toes Trying to reduce stress; has a new therapist  Depression screen [facility] 2/9 03/24/2016 02/19/2016 01/21/2016 10/27/2015 02/18/2015  Decreased Interest 0 0 1 2 0  Down, Depressed, Hopeless 0 0 1 2 0  PHQ - 2 Score 0 0 2 4 0  Altered sleeping - - 3 1 -  Tired, decreased energy - - 3 1 -  Change in appetite - - 1 3 -  Feeling bad or failure about yourself  - - 1 2 -  Trouble concentrating - - 1 1 -  Moving slowly or fidgety/restless - - 1 0 -  Suicidal thoughts - - 0 1 -  PHQ-9 Score - - 12 13 -  Difficult doing work/chores - - Somewhat difficult Somewhat difficult -   Relevant past medical, surgical, family and social history reviewed Past Medical History:  Diagnosis Date  . Anemia, iron deficiency, inadequate dietary intake 12/01/2014  . Depression    patient has not been officially diagnosed but has symptoms  . Diabetes mellitus without complication (HCC)   . Essential hypertension, benign 03/31/2016  . Essential hypertension, benign 03/31/2016  . Irregular periods/menstrual cycles   . Joint pain of leg   . Major depression 11/23/2015   History reviewed. No pertinent surgical history. Family History  Problem Relation Age of Onset  . Diabetes Mother   . Arthritis Father   . Diabetes Father   . Heart disease Father   . Hypertension Father   . Stroke  Father   . Hearing loss Sister   . Hypertension Brother   . Hypertension Paternal Grandfather    Social History  Substance Use Topics  . Smoking status: Never Smoker  . Smokeless tobacco: Never Used  . Alcohol use No   Interim medical history since last visit reviewed. Allergies and medications reviewed  Review of Systems Per HPI unless specifically indicated above     Objective:    BP 128/78   Pulse 98   Temp 99.1 F (37.3 C) (Oral)   Resp 14   Wt 241 lb 8 oz (109.5 kg)   LMP 02/17/2016   SpO2 98%   BMI 36.72 kg/m   Wt Readings from Last 3 Encounters:  04/08/16 241 lb 8 oz (109.5 kg)  03/24/16 241 lb 9 oz (109.6 kg)  02/19/16 241 lb (109.3 kg)    Physical Exam  Constitutional: She appears well-developed and well-nourished. No distress.  Cardiovascular: Normal rate.   Pulmonary/Chest: Effort normal.  Skin: No pallor.  Psychiatric: She has a normal mood and affect.      Assessment & Plan:   Problem List Items Addressed This Visit      Cardiovascular and Mediastinum   Essential  hypertension, benign    Well-controlled today on the higher dose; do not get pregnant on this medicine; if any chance or intention of pregnancy, stop it and let us know right away      Relevant Orders   Urine Microalbumin w/creat. ratio (Completed)   BASIC METABOLIC PANEL WITH GFR (Completed)     Other   Proteinuria    Improving on the ACE-I; do not get pregnant on this medicine; recheck labs      Relevant Orders   Urine Microalbumin w/creat. ratio (Completed)    Other Visit Diagnoses   None.     Follow up plan: Return in about 3 months (around 06/30/2016) for fasting labs and visit with Dr. Sherie DonLada.  An after-visit summary was printed and given to the patient at check-out.  Please see the patient instructions which may contain other information and recommendations beyond what is mentioned above in the assessment and plan.  No orders of the defined types were placed in this  encounter.   Orders Placed This Encounter  Procedures  . Urine Microalbumin w/creat. ratio  . BASIC METABOLIC PANEL WITH GFR

## 2016-04-09 LAB — BASIC METABOLIC PANEL WITH GFR
BUN: 9 mg/dL (ref 7–25)
CHLORIDE: 104 mmol/L (ref 98–110)
CO2: 27 mmol/L (ref 20–31)
CREATININE: 0.76 mg/dL (ref 0.50–1.10)
Calcium: 9.3 mg/dL (ref 8.6–10.2)
GFR, Est African American: >89 mL/min (ref >=60)
GFR, Est Non African American: >89 mL/min (ref >=60)
Glucose, Bld: 185 mg/dL — ABNORMAL HIGH (ref 65–99)
POTASSIUM: 3.9 mmol/L (ref 3.5–5.3)
Sodium: 140 mmol/L (ref 135–146)

## 2016-04-09 LAB — MICROALBUMIN / CREATININE URINE RATIO
Creatinine, Urine: 277 mg/dL (ref 20–320)
Microalb Creat Ratio: 26 mcg/mg creat (ref <30)
Microalb, Ur: 7.1 mg/dL

## 2016-04-28 ENCOUNTER — Encounter: Payer: Self-pay | Admitting: Family Medicine

## 2016-04-28 ENCOUNTER — Ambulatory Visit (INDEPENDENT_AMBULATORY_CARE_PROVIDER_SITE_OTHER): Payer: Medicaid Other | Admitting: Family Medicine

## 2016-04-28 VITALS — BP 124/76 | HR 97 | Temp 98.4°F | Resp 14 | Wt 237.0 lb

## 2016-04-28 DIAGNOSIS — E669 Obesity, unspecified: Secondary | ICD-10-CM | POA: Diagnosis not present

## 2016-04-28 DIAGNOSIS — K8012 Calculus of gallbladder with acute and chronic cholecystitis without obstruction: Secondary | ICD-10-CM | POA: Diagnosis not present

## 2016-04-28 DIAGNOSIS — K760 Fatty (change of) liver, not elsewhere classified: Secondary | ICD-10-CM | POA: Diagnosis not present

## 2016-04-28 DIAGNOSIS — I10 Essential (primary) hypertension: Secondary | ICD-10-CM

## 2016-04-28 NOTE — Patient Instructions (Addendum)
Try to limit saturated fats in your diet (bologna, hot dogs, barbeque, cheeseburgers, hamburgers, steak, bacon, sausage, cheese, etc.) and get more fresh fruits, vegetables, and whole grains Your goal blood pressure is less than 130 mmHg on top and under 80 on the bottom Try to follow the DASH guidelines (DASH stands for Dietary Approaches to Stop Hypertension) Try to limit the sodium in your diet.  Ideally, consume less than 1.5 grams (less than 1,500mg ) per day. Do not add salt when cooking or at the table.  Check the sodium amount on labels when shopping, and choose items lower in sodium when given a choice. Avoid or limit foods that already contain a lot of sodium. Eat a diet rich in fruits and vegetables and whole grains. Check out the information at familydoctor.org entitled "Nutrition for Weight Loss: What You Need to Know about Fad Diets" Try to lose between 1-2 pounds per week by taking in fewer calories and burning off more calories You can succeed by limiting portions, limiting foods dense in calories and fat, becoming more active, and drinking 8 glasses of water a day (64 ounces) Don't skip meals, especially breakfast, as skipping meals may alter your metabolism Do not use over-the-counter weight loss pills or gimmicks that claim rapid weight loss A healthy BMI (or body mass index) is between 18.5 and 24.9 You can calculate your ideal BMI at the NIH website [employer]huhttp://www.nhlbi.nih.gov/health/educational/lose_wt/BMI/bmicalc.htm   Fatty Liver Introduction Fatty liver, also called hepatic steatosis or steatohepatitis, is a condition in which too much fat has built up in your liver cells. The liver removes harmful substances from your bloodstream. It produces fluids your body needs. It also helps your body use and store energy from the food you eat. In many cases, fatty liver does not cause symptoms or problems. It is often diagnosed when tests are being done for other reasons. However, over  time, fatty liver can cause inflammation that may lead to more serious liver problems, such as scarring of the liver (cirrhosis). What are the causes? Causes of fatty liver may include:  Drinking too much alcohol.  Poor nutrition.  Obesity.  Cushing syndrome.  Diabetes.  Hyperlipidemia.  Pregnancy.  Certain drugs.  Poisons.  Some viral infections. What increases the risk? You may be more likely to develop fatty liver if you:  Abuse alcohol.  Are pregnant.  Are overweight.  Have diabetes.  Have hepatitis.  Have a high triglyceride level. What are the signs or symptoms? Fatty liver often does not cause any symptoms. In cases where symptoms develop, they can include:  Fatigue.  Weakness.  Weight loss.  Confusion.  Abdominal pain.  Yellowing of your skin and the white parts of your eyes (jaundice).  Nausea and vomiting. How is this diagnosed? Fatty liver may be diagnosed by:  Physical exam and medical history.  Blood tests.  Imaging tests, such as an ultrasound, CT scan, or MRI.  Liver biopsy. A small sample of liver tissue is removed using a needle. The sample is then looked at under a microscope. How is this treated? Fatty liver is often caused by other health conditions. Treatment for fatty liver may involve medicines and lifestyle changes to manage conditions such as:  Alcoholism.  High cholesterol.  Diabetes.  Being overweight or obese. Follow these instructions at home:  Eat a healthy diet as directed by your health care provider.  Exercise regularly. This can help you lose weight and control your cholesterol and diabetes. Talk to your health care  provider about an exercise plan and which activities are best for you.  Do not drink alcohol.  Take medicines only as directed by your health care provider. Contact a health care provider if: You have difficulty controlling your:  Blood sugar.  Cholesterol.  Alcohol consumption. Get  help right away if:  You have abdominal pain.  You have jaundice.  You have nausea and vomiting. This information is not intended to replace advice given to you by your health care provider. Make sure you discuss any questions you have with your health care provider. Document Released: 07/15/2005 Document Revised: 11/05/2015 Document Reviewed: 10/09/2013  2017 Elsevier

## 2016-04-28 NOTE — Progress Notes (Signed)
BP 124/76   Pulse 97   Temp 98.4 F (36.9 C) (Oral)   Resp 14   Wt 237 lb (107.5 kg)   SpO2 98%   BMI 36.04 kg/m    Subjective:    Patient ID: Penny Gonzalez, female    DOB: 04/24/1996, 20 y.o.   MRN: [id number]  HPI: Penny Gonzalez is a 20 y.o. female  Chief Complaint  Patient presents with  . Hypertension    running high  . fatty liver    discuss this   Patient is here to go over her blood pressure High blood pressure at the last two doctor visits (GYN and surgeon) We reviewed records in the computer 127/93 at the surgeon's office yesterday 153/110 at the GYN's office yesterday; rechecked 150/106 No excessive salt, no pain, not really stressed, then says maybe it was the nervousness Mother and father both have HTN  Surgeon plans to do choly on December 7th; told her she has a liver disease, fatty liver Father also had fatty liver Patient will eat sausage here and there for breakfast; not many hamburgers; not much cheese  Obesity; she has managed to lose a few pounds since last visit  ----------------------------------------------------- Abdominal US done March 05, 2015 EXAM: ULTRASOUND ABDOMEN COMPLETE  COMPARISON:  Ultrasound 07/04/2014  FINDINGS: Gallbladder: Multiple mobile stones are demonstrated within the gallbladder lumen. No gallbladder wall thickening or pericholecystic fluid. Negative sonographic Murphy's sign.  Common bile duct: Diameter: 2 mm  Liver: Diffusely increased in echogenicity. No focal lesion identified.  IVC: No abnormality visualized.  Pancreas: Visualized portion unremarkable.  Spleen: Size and appearance within normal limits.  Right Kidney: Length: 12.8 cm. Echogenicity within normal limits. No mass or hydronephrosis visualized.  Left Kidney: Length: 12.2 cm. Echogenicity within normal limits. No mass or hydronephrosis visualized.  Abdominal aorta: No aneurysm visualized.  Other findings:  None.  IMPRESSION: Increased hepatic parenchymal echogenicity suggestive of steatosis.  Cholelithiasis without sonographic evidence for acute cholecystitis.   Electronically Signed   By: Annia Beltrew  Davis M.D.   On: 03/05/2015 09:32 --------------------------------------  [facility] 2/[facility] 03/24/2016 02/19/2016 01/21/2016 10/27/2015 02/18/2015  Decreased Interest 0 0 1 2 0  Down, Depressed, Hopeless 0 0 1 2 0  PHQ - 2 Score 0 0 2 4 0  Altered sleeping - - 3 1 -  Tired, decreased energy - - 3 1 -  Change in appetite - - 1 3 -  Feeling bad or failure about yourself  - - 1 2 -  Trouble concentrating - - 1 1 -  Moving slowly or fidgety/restless - - 1 0 -  Suicidal thoughts - - 0 1 -  PHQ-9 Score - - 12 13 -  Difficult doing work/chores - - Somewhat difficult Somewhat difficult -   Relevant past medical, surgical, family and social history reviewed Past Medical History:  Diagnosis Date  . Anemia, iron deficiency, inadequate dietary intake 12/01/2014  . Depression    patient has not been officially diagnosed but has symptoms  . Diabetes mellitus without complication (HCC)   . Essential hypertension, benign 03/31/2016  . Essential hypertension, benign 03/31/2016  . Irregular periods/menstrual cycles   . Joint pain of leg   . Major depression 11/23/2015   History reviewed. No pertinent surgical history. Family History  Problem Relation Age of Onset  . Diabetes Mother   . Arthritis Father   . Diabetes Father   . Heart disease Father   . Hypertension Father   .  Stroke Father   . Hearing loss Sister   . Hypertension Brother   . Hypertension Paternal Grandfather    Social History  Substance Use Topics  . Smoking status: Never Smoker  . Smokeless tobacco: Never Used  . Alcohol use No   Interim medical history since last visit reviewed. Allergies and medications reviewed  Review of Systems Per HPI unless specifically indicated above     Objective:    BP 124/76    Pulse 97   Temp 98.4 F (36.9 C) (Oral)   Resp 14   Wt 237 lb (107.5 kg)   SpO2 98%   BMI 36.04 kg/m   Wt Readings from Last 3 Encounters:  04/28/16 237 lb (107.5 kg)  04/08/16 241 lb 8 oz (109.5 kg)  03/24/16 241 lb 9 oz (109.6 kg)    Physical Exam  Constitutional: She appears well-developed and well-nourished. No distress.  Eyes: EOM are normal. No scleral icterus.  Neck: No thyromegaly present.  Cardiovascular: Normal rate and regular rhythm.   Pulmonary/Chest: Effort normal and breath sounds normal.  Abdominal: She exhibits no distension.  Musculoskeletal: She exhibits no edema.  Skin: No pallor.  Psychiatric: She has a normal mood and affect. Her behavior is normal. Judgment and thought content normal.   Results for orders placed or performed in visit on 04/08/16  Urine Microalbumin w/creat. ratio  Result Value Ref Range   Creatinine, Urine 277 20 - 320 mg/dL   Microalb, Ur 7.1 Not estab mg/dL   Microalb Creat Ratio 26 <30 mcg/mg creat  BASIC METABOLIC PANEL WITH GFR  Result Value Ref Range   Sodium 140 135 - 146 mmol/L   Potassium 3.9 3.5 - 5.3 mmol/L   Chloride 104 98 - 110 mmol/L   CO2 27 20 - 31 mmol/L   Glucose, Bld 185 (H) 65 - 99 mg/dL   BUN 9 7 - 25 mg/dL   Creat 1.610.76 0.960.50 - 0.451.10 mg/dL   Calcium 9.3 8.6 - 40.910.2 mg/dL   GFR, Est African American >89 >=60 mL/min   GFR, Est Non African American >89 >=60 mL/min      Assessment & Plan:   Problem List Items Addressed This Visit      Cardiovascular and Mediastinum   Essential hypertension, benign (Chronic)    Reviewed blood pressures with her, as well as ideal ranges; work on weight loss, DASH guidelines        Digestive   Fatty liver (Chronic)    Work on weight loss, strategies discussed      Cholelithiasis    Plans for surgery in December; limit fatty foods; to ER if acute attack        Other   Obesity, Class II, 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  An after-visit summary was printed and given to the patient at check-out.  Please see the patient instructions which may contain other information and recommendations beyond what is mentioned above in the assessment and plan.  Face-to-face time with patient was more than 25 minutes, >50% time spent counseling and coordination of care

## 2016-05-07 DIAGNOSIS — K760 Fatty (change of) liver, not elsewhere classified: Secondary | ICD-10-CM | POA: Insufficient documentation

## 2016-05-07 NOTE — Assessment & Plan Note (Signed)
Work on weight loss, strategies discussed

## 2016-05-07 NOTE — Assessment & Plan Note (Signed)
Plans for surgery in December; limit fatty foods; to ER if acute attack

## 2016-05-07 NOTE — Assessment & Plan Note (Signed)
Encouraged weight loss, which is impacting her BP and her fatty liver

## 2016-05-07 NOTE — Assessment & Plan Note (Signed)
Reviewed blood pressures with her, as well as ideal ranges; work on weight loss, DASH guidelines

## 2016-05-12 ENCOUNTER — Encounter
Admission: RE | Admit: 2016-05-12 | Discharge: 2016-05-12 | Disposition: A | Discharge status: Home / Self Care | Payer: Medicaid Other | Source: Ambulatory Visit | Attending: Surgery | Admitting: Surgery

## 2016-05-12 NOTE — Patient Instructions (Signed)
  Your procedure is scheduled on: 05-19-16 [facility](THURSDAY) Report to Same Day Surgery 2nd floor medical mall [facility](Medical Mall Entrance-take elevator on left to 2nd floor.  Check in with surgery information desk.) To find out your arrival time please call [phone] between 1PM - 3PM on 05-18-16 [facility](WEDNESDAY)  Remember: Instructions that are not followed completely may result in serious medical risk, up to and including death, or upon the discretion of your surgeon and anesthesiologist your surgery may need to be rescheduled.    _x___ 1. Do not eat food or drink liquids after midnight. No gum chewing or hard candies.     __x__ 2. No Alcohol for 24 hours before or after surgery.   __x__3. No Smoking for 24 prior to surgery.   ____  4. Bring all medications with you on the day of surgery if instructed.    __x__ 5. Notify your doctor if there is any change in your medical condition     (cold, fever, infections).     Do not wear jewelry, make-up, hairpins, clips or nail polish.  Do not wear lotions, powders, or perfumes. You may wear deodorant.  Do not shave 48 hours prior to surgery. Men may shave face and neck.  Do not bring valuables to the hospital.    [facility] belongings or valuables.               Contacts, dentures or bridgework may not be worn into surgery.  Leave your suitcase in the car. After surgery it may be brought to your room.  For patients admitted to the hospital, discharge time is determined by your treatment team.   Patients discharged the day of surgery will not be allowed to drive home.  You will need someone to drive you home and stay with you the night of your procedure.    Please read over the following fact sheets that you were given:   [facility] Preparing for [facility] and or MRSA Information   _x___ Take these medicines the morning of surgery with A SIP OF WATER:    1. LISINOPRIL  2.  3.  4.  5.  6.  ____Fleets enema or Magnesium  Citrate as directed.   _x___ Use CHG Soap or sage wipes as directed on instruction sheet   ____ Use inhalers on the day of surgery and bring to hospital day of surgery  _X___ Stop metformin 2 days prior to surgery-LAST DOSE Monday, 05-16-16    ____ Take 1/2 of usual insulin dose the night before surgery and none on the morning of  surgery.   ____ Stop Aspirin, Coumadin, Pllavix ,Eliquis, Effient, or Pradaxa  x__ Stop Anti-inflammatories such as Advil, Aleve, Ibuprofen, Motrin, Naproxen,          Naprosyn, Goodies powders or aspirin products. Ok to take Tylenol.   ____ Stop supplements until after surgery.    ____ Bring C-Pap to the hospital.

## 2016-05-16 ENCOUNTER — Ambulatory Visit
Admission: RE | Admit: 2016-05-16 | Discharge: 2016-05-16 | Disposition: A | Discharge status: Home / Self Care | Payer: Medicaid Other | Source: Ambulatory Visit | Attending: Family Medicine | Admitting: Family Medicine

## 2016-05-16 ENCOUNTER — Encounter: Payer: Self-pay | Admitting: Family Medicine

## 2016-05-16 ENCOUNTER — Encounter
Admission: RE | Admit: 2016-05-16 | Discharge: 2016-05-16 | Disposition: A | Discharge status: Home / Self Care | Payer: Medicaid Other | Source: Ambulatory Visit | Attending: Surgery | Admitting: Surgery

## 2016-05-16 ENCOUNTER — Ambulatory Visit (INDEPENDENT_AMBULATORY_CARE_PROVIDER_SITE_OTHER): Payer: Medicaid Other | Admitting: Family Medicine

## 2016-05-16 VITALS — BP 130/90 | HR 100 | Temp 98.6°F | Resp 16 | Wt 236.2 lb

## 2016-05-16 DIAGNOSIS — E669 Obesity, unspecified: Secondary | ICD-10-CM | POA: Insufficient documentation

## 2016-05-16 DIAGNOSIS — R1011 Right upper quadrant pain: Secondary | ICD-10-CM | POA: Insufficient documentation

## 2016-05-16 DIAGNOSIS — Z01818 Encounter for other preprocedural examination: Secondary | ICD-10-CM | POA: Diagnosis not present

## 2016-05-16 DIAGNOSIS — R809 Proteinuria, unspecified: Secondary | ICD-10-CM | POA: Insufficient documentation

## 2016-05-16 DIAGNOSIS — I10 Essential (primary) hypertension: Secondary | ICD-10-CM | POA: Insufficient documentation

## 2016-05-16 DIAGNOSIS — E119 Type 2 diabetes mellitus without complications: Secondary | ICD-10-CM | POA: Insufficient documentation

## 2016-05-16 DIAGNOSIS — Z01810 Encounter for preprocedural cardiovascular examination: Secondary | ICD-10-CM | POA: Insufficient documentation

## 2016-05-16 DIAGNOSIS — N898 Other specified noninflammatory disorders of vagina: Secondary | ICD-10-CM | POA: Diagnosis not present

## 2016-05-16 DIAGNOSIS — E559 Vitamin D deficiency, unspecified: Secondary | ICD-10-CM | POA: Diagnosis not present

## 2016-05-16 DIAGNOSIS — K8012 Calculus of gallbladder with acute and chronic cholecystitis without obstruction: Secondary | ICD-10-CM | POA: Diagnosis not present

## 2016-05-16 DIAGNOSIS — R9431 Abnormal electrocardiogram [ECG] [EKG]: Secondary | ICD-10-CM | POA: Insufficient documentation

## 2016-05-16 DIAGNOSIS — Z01812 Encounter for preprocedural laboratory examination: Secondary | ICD-10-CM | POA: Insufficient documentation

## 2016-05-16 DIAGNOSIS — R059 Cough, unspecified: Secondary | ICD-10-CM

## 2016-05-16 DIAGNOSIS — R05 Cough: Secondary | ICD-10-CM | POA: Diagnosis present

## 2016-05-16 DIAGNOSIS — K760 Fatty (change of) liver, not elsewhere classified: Secondary | ICD-10-CM | POA: Diagnosis not present

## 2016-05-16 DIAGNOSIS — F329 Major depressive disorder, single episode, unspecified: Secondary | ICD-10-CM | POA: Insufficient documentation

## 2016-05-16 DIAGNOSIS — D508 Other iron deficiency anemias: Secondary | ICD-10-CM | POA: Insufficient documentation

## 2016-05-16 DIAGNOSIS — R51 Headache: Secondary | ICD-10-CM | POA: Insufficient documentation

## 2016-05-16 DIAGNOSIS — Z202 Contact with and (suspected) exposure to infections with a predominantly sexual mode of transmission: Secondary | ICD-10-CM | POA: Diagnosis not present

## 2016-05-16 DIAGNOSIS — K8020 Calculus of gallbladder without cholecystitis without obstruction: Secondary | ICD-10-CM | POA: Insufficient documentation

## 2016-05-16 DIAGNOSIS — Z6839 Body mass index (BMI) 39.0-39.9, adult: Secondary | ICD-10-CM | POA: Insufficient documentation

## 2016-05-16 DIAGNOSIS — I498 Other specified cardiac arrhythmias: Secondary | ICD-10-CM | POA: Diagnosis not present

## 2016-05-16 DIAGNOSIS — R300 Dysuria: Secondary | ICD-10-CM | POA: Insufficient documentation

## 2016-05-16 DIAGNOSIS — G8929 Other chronic pain: Secondary | ICD-10-CM | POA: Diagnosis present

## 2016-05-16 LAB — HEPATIC FUNCTION PANEL
ALBUMIN: 3.9 g/dL (ref 3.5–5.0)
ALT: 24 U/L (ref 14–54)
AST: 22 U/L (ref 15–41)
Alkaline Phosphatase: 44 U/L (ref 38–126)
Bilirubin, Direct: <0.1 mg/dL — ABNORMAL LOW (ref 0.1–0.5)
TOTAL PROTEIN: 7.4 g/dL (ref 6.5–8.1)
Total Bilirubin: 0.3 mg/dL (ref 0.3–1.2)

## 2016-05-16 LAB — DIFFERENTIAL
BASOS ABS: 0 10*3/uL (ref 0–0.1)
Basophils Relative: 0 %
EOS ABS: 0.1 10*3/uL (ref 0–0.7)
EOS PCT: 1 %
LYMPHS ABS: 1.8 10*3/uL (ref 1.0–3.6)
Lymphocytes Relative: 27 %
Monocytes Absolute: 0.3 10*3/uL (ref 0.2–0.9)
Monocytes Relative: 4 %
NEUTROS PCT: 68 %
Neutro Abs: 4.4 10*3/uL (ref 1.4–6.5)

## 2016-05-16 LAB — CBC
HCT: 40.2 % (ref 35.0–47.0)
HEMOGLOBIN: 13.4 g/dL (ref 12.0–16.0)
MCH: 28.1 pg (ref 26.0–34.0)
MCHC: 33.3 g/dL (ref 32.0–36.0)
MCV: 84.2 fL (ref 80.0–100.0)
PLATELETS: 235 10*3/uL (ref 150–440)
RBC: 4.77 MIL/uL (ref 3.80–5.20)
RDW: 14.4 % (ref 11.5–14.5)
WBC: 6.6 10*3/uL (ref 3.6–11.0)

## 2016-05-16 MED ORDER — BENZONATATE 100 MG PO CAPS: 100.0000 mg | ORAL_CAPSULE | Freq: Three times a day (TID) | ORAL | 0 refills | Status: DC | PRN

## 2016-05-16 MED ORDER — BECLOMETHASONE DIPROPIONATE 40 MCG/ACT IN AERS: 2.0000 | INHALATION_SPRAY | Freq: Two times a day (BID) | RESPIRATORY_TRACT | 0 refills | Status: DC

## 2016-05-16 NOTE — Assessment & Plan Note (Signed)
I do not believe her cough to be anything serious to preclude her surgery later this week; I will get CXR, and if normal, will clear her for lap choly

## 2016-05-16 NOTE — Patient Instructions (Signed)
Please have the chest xray today Use the new inhaler twice a day to help calm down your airways Rinse your mouth out after the 2nd puff each use Use the cough medicine If cough persists, we'll consider stopping your lisinopril and switching to something else After the xray, I should be able to clear you for surgery

## 2016-05-16 NOTE — Progress Notes (Signed)
BP 130/90 (BP Location: Left Arm, Patient Position: Sitting, Cuff Size: Normal)   Pulse 100   Temp 98.6 F (37 C) (Oral)   Resp 16   Wt 236 lb 4 oz (107.2 kg)   LMP 03/12/2016 (Approximate)   SpO2 94%   BMI 34.89 kg/m    Subjective:    Patient ID: Penny AmosSamantha B Skibicki, female    DOB: 03/08/1996, 20 y.o.   MRN: [id number]  HPI: Penny Gonzalez is a 20 y.o. female  Chief Complaint  Patient presents with  . Procedure    Surgical Clearence for a cough    Patient says she needs surgical clearance because of cough Dr. Nadeen LandauJarvis Wilton Smith is planning on doing a lap choly on December 7th (today is Dec 4th) She has never smoked She had a cold and bronchitis; she doesn't feel bad; doesn't feel sick She has had a cold and the cough has just lingered; [place] came [place] from [place], she got the cold last weekend She had the bronchitis a few years ago; every time she gets sick, it's the coughing part at the end She has been taking robitussin and that helped at first She doesn't want to reschedule She has not taken the ACE-I for two days but can't tell any difference  [facility] screen [facility] 2/9 05/16/2016 03/24/2016 02/19/2016 01/21/2016 10/27/2015  Decreased Interest 0 0 0 1 2  Down, Depressed, Hopeless 0 0 0 1 2  PHQ - 2 Score 0 0 0 2 4  Altered sleeping - - - 3 1  Tired, decreased energy - - - 3 1  Change in appetite - - - 1 3  Feeling bad or failure about yourself  - - - 1 2  Trouble concentrating - - - 1 1  Moving slowly or fidgety/restless - - - 1 0  Suicidal thoughts - - - 0 1  PHQ-9 Score - - - 12 13  Difficult doing work/chores - - - Somewhat difficult Somewhat difficult   Relevant past medical, surgical, family and social history reviewed Past Medical History:  Diagnosis Date  . Anemia, iron deficiency, inadequate dietary intake 12/01/2014  . Bronchitis   . Cold 05/12/2016   RESOLVING PER PT  . Depression    patient has not been officially diagnosed but has  symptoms  . Diabetes mellitus without complication (HCC)   . Essential hypertension, benign 03/31/2016  . Essential hypertension, benign 03/31/2016  . Irregular periods/menstrual cycles   . Joint pain of leg   . Major depression 11/23/2015   History reviewed. No pertinent surgical history. Social History  Substance Use Topics  . Smoking status: Never Smoker  . Smokeless tobacco: Never Used  . Alcohol use 0.0 oz/week     Comment: OCC   Interim medical history since last visit reviewed. Allergies and medications reviewed  Review of Systems Per HPI unless specifically indicated above     Objective:    BP 130/90 (BP Location: Left Arm, Patient Position: Sitting, Cuff Size: Normal)   Pulse 100   Temp 98.6 F (37 C) (Oral)   Resp 16   Wt 236 lb 4 oz (107.2 kg)   LMP 03/12/2016 (Approximate)   SpO2 94%   BMI 34.89 kg/m   Wt Readings from Last 3 Encounters:  05/16/16 236 lb 4 oz (107.2 kg)  05/12/16 235 lb (106.6 kg)  04/28/16 237 lb (107.5 kg)    Physical Exam  Constitutional: She appears well-developed and well-nourished.  HENT:  Right Ear: Hearing, tympanic membrane, external ear and ear canal normal.  Left Ear: Hearing, tympanic membrane, external ear and ear canal normal.  Nose: No rhinorrhea.  Mouth/Throat: Oropharynx is clear and moist and mucous membranes are normal.  Eyes: EOM are normal. No scleral icterus.  Cardiovascular: Normal rate and regular rhythm.   Pulmonary/Chest: Effort normal and breath sounds normal. No accessory muscle usage. No respiratory distress. She has no decreased breath sounds. She has no wheezes. She has no rhonchi. She has no rales.  Few dry-sounding coughs during visit  Lymphadenopathy:    She has no cervical adenopathy.       Right: No supraclavicular adenopathy present.       Left: No supraclavicular adenopathy present.  Skin: She is not diaphoretic. No pallor.  Psychiatric: She has a normal mood and affect. Her behavior is normal.     Results for orders placed or performed during the hospital encounter of 05/16/16  CBC  Result Value Ref Range   WBC 6.6 3.6 - 11.0 K/uL   RBC 4.77 3.80 - 5.20 MIL/uL   Hemoglobin 13.4 12.0 - 16.0 g/dL   HCT 16.140.2 09.635.0 - 04.547.0 %   MCV 84.2 80.0 - 100.0 fL   MCH 28.1 26.0 - 34.0 pg   MCHC 33.3 32.0 - 36.0 g/dL   RDW 40.914.4 81.111.5 - 91.414.5 %   Platelets 235 150 - 440 K/uL  Differential  Result Value Ref Range   Neutrophils Relative % 68 %   Neutro Abs 4.4 1.4 - 6.5 K/uL   Lymphocytes Relative 27 %   Lymphs Abs 1.8 1.0 - 3.6 K/uL   Monocytes Relative 4 %   Monocytes Absolute 0.3 0.2 - 0.9 K/uL   Eosinophils Relative 1 %   Eosinophils Absolute 0.1 0 - 0.7 K/uL   Basophils Relative 0 %   Basophils Absolute 0.0 0 - 0.1 K/uL  Hepatic function panel  Result Value Ref Range   Total Protein 7.4 6.5 - 8.1 g/dL   Albumin 3.9 3.5 - 5.0 g/dL   AST 22 15 - 41 U/L   ALT 24 14 - 54 U/L   Alkaline Phosphatase 44 38 - 126 U/L   Total Bilirubin 0.3 0.3 - 1.2 mg/dL   Bilirubin, Direct <7.8<0.1 (L) 0.1 - 0.5 mg/dL   Indirect Bilirubin NOT CALCULATED 0.3 - 0.9 mg/dL      Assessment & Plan:   Problem List Items Addressed This Visit      Digestive   Cholelithiasis    I do not believe her cough to be anything serious to preclude her surgery later this week; I will get CXR, and if normal, will clear her for lap choly       Other Visit Diagnoses    Cough    -  Primary   sounds to be post-bronchitis; will Rx tessalon perles and QVAR for one month only; rinse mouth out p use; CXR today for pre-op; if persists, stop ACE-I   Relevant Orders   DG Chest 2 View      Follow up plan: No Follow-up on file.  An after-visit summary was printed and given to the patient at check-out.  Please see the patient instructions which may contain other information and recommendations beyond what is mentioned above in the assessment and plan.  Meds ordered this encounter  Medications  . benzonatate (TESSALON PERLES)  100 MG capsule    Sig: Take 1 capsule (100 mg total) by mouth every 8 (eight)  hours as needed for cough.    Dispense:  30 capsule    Refill:  0  . beclomethasone (QVAR) 40 MCG/ACT inhaler    Sig: Inhale 2 puffs into the lungs 2 (two) times daily.    Dispense:  1 Inhaler    Refill:  0    Orders Placed This Encounter  Procedures  . DG Chest 2 View

## 2016-05-17 NOTE — Pre-Procedure Instructions (Signed)
Progress Notes Encounter Date: 05/16/2016 2:20 PM Penny Passey, MD  Family Medicine    [] Hide copied text [] Hover for attribution information   BP 130/90 (BP Location: Left Arm, Patient Position: Sitting, Cuff Size: Normal)   Pulse 100   Temp 98.6 F (37 C) (Oral)   Resp 16   Wt 236 lb 4 oz (107.2 kg)   LMP 03/12/2016 (Approximate)   SpO2 94%   BMI 34.89 kg/m    Subjective:    Patient ID: Penny Gonzalez, female    DOB: 01-21-1996, 20 y.o.   MRN: [id number]  HPI: Penny Gonzalez is a 20 y.o. female      Chief Complaint  Patient presents with  . Procedure    Surgical Clearence for a cough    Patient says she needs surgical clearance because of cough Dr. Nadeen Landau is planning on doing a lap choly on December 7th (today is Dec 4th) She has never smoked She had a cold and bronchitis; she doesn't feel bad; doesn't feel sick She has had a cold and the cough has just lingered; when she came back from [place], she got the cold last weekend She had the bronchitis a few years ago; every time she gets sick, it's the coughing part at the end She has been taking robitussin and that helped at first She doesn't want to reschedule She has not taken the ACE-I for two days but can't tell any difference  Depression screen [facility] 2/9 05/16/2016 03/24/2016 02/19/2016 01/21/2016 10/27/2015  Decreased Interest 0 0 0 1 2  Down, Depressed, Hopeless 0 0 0 1 2  PHQ - 2 Score 0 0 0 2 4  Altered sleeping - - - 3 1  Tired, decreased energy - - - 3 1  Change in appetite - - - 1 3  Feeling bad or failure about yourself  - - - 1 2  Trouble concentrating - - - 1 1  Moving slowly or fidgety/restless - - - 1 0  Suicidal thoughts - - - 0 1  PHQ-9 Score - - - 12 13  Difficult doing work/chores - - - Somewhat difficult Somewhat difficult   Relevant past medical, surgical, family and social history reviewed     Past Medical History:  Diagnosis Date  . Anemia, iron  deficiency, inadequate dietary intake 12/01/2014  . Bronchitis   . Cold 05/12/2016   RESOLVING PER PT  . Depression    patient has not been officially diagnosed but has symptoms  . Diabetes mellitus without complication (HCC)   . Essential hypertension, benign 03/31/2016  . Essential hypertension, benign 03/31/2016  . Irregular periods/menstrual cycles   . Joint pain of leg   . Major depression 11/23/2015   History reviewed. No pertinent surgical history.        Social History   Substance Use Topics   . Smoking status: Never Smoker   . Smokeless tobacco: Never Used   . Alcohol use 0.0 oz/week     Comment: OCC    Interim medical history since last visit reviewed. Allergies and medications reviewed  Review of Systems Per HPI unless specifically indicated above     Objective:    BP 130/90 (BP Location: Left Arm, Patient Position: Sitting, Cuff Size: Normal)   Pulse 100   Temp 98.6 F (37 C) (Oral)   Resp 16   Wt 236 lb 4 oz (107.2 kg)   LMP 03/12/2016 (Approximate)   SpO2 94%   BMI  34.89 kg/m      Wt Readings from Last 3 Encounters:  05/16/16 236 lb 4 oz (107.2 kg)  05/12/16 235 lb (106.6 kg)  04/28/16 237 lb (107.5 kg)    Physical Exam  Constitutional: She appears well-developed and well-nourished.  HENT:  Right Ear: Hearing, tympanic membrane, external ear and ear canal normal.  Left Ear: Hearing, tympanic membrane, external ear and ear canal normal.  Nose: No rhinorrhea.  Mouth/Throat: Oropharynx is clear and moist and mucous membranes are normal.  Eyes: EOM are normal. No scleral icterus.  Cardiovascular: Normal rate and regular rhythm.   Pulmonary/Chest: Effort normal and breath sounds normal. No accessory muscle usage. No respiratory distress. She has no decreased breath sounds. She has no wheezes. She has no rhonchi. She has no rales.  Few dry-sounding coughs during visit  Lymphadenopathy:    She has no cervical adenopathy.       Right: No  supraclavicular adenopathy present.       Left: No supraclavicular adenopathy present.  Skin: She is not diaphoretic. No pallor.  Psychiatric: She has a normal mood and affect. Her behavior is normal.        Results for orders placed or performed during the hospital encounter of 05/16/16  CBC  Result Value Ref Range   WBC 6.6 3.6 - 11.0 K/uL   RBC 4.77 3.80 - 5.20 MIL/uL   Hemoglobin 13.4 12.0 - 16.0 g/dL   HCT 25.340.2 66.435.0 - 40.347.0 %   MCV 84.2 80.0 - 100.0 fL   MCH 28.1 26.0 - 34.0 pg   MCHC 33.3 32.0 - 36.0 g/dL   RDW 47.414.4 25.911.5 - 56.314.5 %   Platelets 235 150 - 440 K/uL  Differential  Result Value Ref Range   Neutrophils Relative % 68 %   Neutro Abs 4.4 1.4 - 6.5 K/uL   Lymphocytes Relative 27 %   Lymphs Abs 1.8 1.0 - 3.6 K/uL   Monocytes Relative 4 %   Monocytes Absolute 0.3 0.2 - 0.9 K/uL   Eosinophils Relative 1 %   Eosinophils Absolute 0.1 0 - 0.7 K/uL   Basophils Relative 0 %   Basophils Absolute 0.0 0 - 0.1 K/uL  Hepatic function panel  Result Value Ref Range   Total Protein 7.4 6.5 - 8.1 g/dL   Albumin 3.9 3.5 - 5.0 g/dL   AST 22 15 - 41 U/L   ALT 24 14 - 54 U/L   Alkaline Phosphatase 44 38 - 126 U/L   Total Bilirubin 0.3 0.3 - 1.2 mg/dL   Bilirubin, Direct <8.7<0.1 (L) 0.1 - 0.5 mg/dL   Indirect Bilirubin NOT CALCULATED 0.3 - 0.9 mg/dL      Assessment & Plan:   Problem List Items Addressed This Visit          Digestive   Cholelithiasis    I do not believe her cough to be anything serious to preclude her surgery later this week; I will get CXR, and if normal, will clear her for lap choly            Other Visit Diagnoses    Cough    -  Primary   sounds to be post-bronchitis; will Rx tessalon perles and QVAR for one month only; rinse mouth out p use; CXR today for pre-op; if persists, stop ACE-I   Relevant Orders   DG Chest 2 View      Follow up plan: No Follow-up on file.  An after-visit summary was printed  and  given to the patient at check-out.  Please see the patient instructions which may contain other information and recommendations beyond what is mentioned above in the assessment and plan.      Meds ordered this encounter  Medications  . benzonatate (TESSALON PERLES) 100 MG capsule    Sig: Take 1 capsule (100 mg total) by mouth every 8 (eight) hours as needed for cough.    Dispense:  30 capsule    Refill:  0  . beclomethasone (QVAR) 40 MCG/ACT inhaler    Sig: Inhale 2 puffs into the lungs 2 (two) times daily.    Dispense:  1 Inhaler    Refill:  0       Orders Placed This Encounter  Procedures  . DG Chest 2 View       Electronically signed by Penny PasseyMelinda P Lada, MD at 05/16/2016 3:07 PM      Office Visit on 05/16/2016        Detailed Report

## 2016-05-17 NOTE — Pre-Procedure Instructions (Signed)
DG Chest 2 View (Accession [id number]) (Order [id number])  Imaging  Date: 05/16/2016 [facility] Ordering/Authorizing: Kerman PasseyMelinda P Lada, MD  Exam Information   Status Exam Begun  Exam Ended   Final [99] 05/16/2016 3:27 PM 05/16/2016 3:35 PM  PACS Images   Show images for DG Chest 2 View  Study Result   CLINICAL DATA:  Cough  EXAM: CHEST  2 VIEW  COMPARISON:  12/05/2014  FINDINGS: The heart size and mediastinal contours are within normal limits. Both lungs are clear. The visualized skeletal structures are unremarkable.  IMPRESSION: No active cardiopulmonary disease.   Electronically Signed   By: Natasha MeadLiviu  Pop M.D.   On: 05/16/2016 16:52   Result Notes   Notes Recorded by Kerman PasseyMelinda P Lada, MD on 05/16/2016 at 5:59 PM EST Good news. Your chest xray was completely normal. Best wishes for your surgery!      Vitals   Height Weight BMI (Calculated)  5\' 9"  (1.753 m) 236 lb 4 oz (107.2 kg) 34.8  External Result Report   External Result Report  Imaging   Imaging Information  Patient Result Comments   Viewed by Maryagnes AmosSamantha B Eleazer on 05/16/2016 6:02 PM  Written by Kerman PasseyLada, Melinda P, MD on 05/16/2016 5:59 PM  Good news. Your chest xray was completely normal. Best wishes for your surgery!  Signed by   Signed Date/Time  Phone Pager  POP, LIVIU 05/16/2016 4:52 PM [phone]  Result Notes   Notes Recorded by Kerman PasseyMelinda P Lada, MD on 05/16/2016 at 5:59 PM EST Good news. Your chest xray was completely normal. Best wishes for your surgery!      Signed   Electronically signed by Natasha MeadLiviu Pop, MD on 05/16/16 at 1652 EST

## 2016-05-17 NOTE — Pre-Procedure Instructions (Signed)
Cholelithiasis A&P Encounter Date: 05/16/2016 3:06 PM Kerman PasseyMelinda P Lada, MD  Family Medicine    [] Hide copied text [] Hover for attribution information I do not believe her cough to be anything serious to preclude her surgery later this week; I will get CXR, and if normal, will clear her for lap choly    Electronically signed by Kerman PasseyMelinda P Lada, MD

## 2016-05-18 ENCOUNTER — Encounter: Payer: Self-pay | Admitting: *Deleted

## 2016-05-19 ENCOUNTER — Ambulatory Visit
Admission: RE | Admit: 2016-05-19 | Discharge: 2016-05-19 | Disposition: A | Discharge status: Home / Self Care | Payer: Medicaid Other | Source: Ambulatory Visit | Attending: Surgery | Admitting: Surgery

## 2016-05-19 ENCOUNTER — Encounter: Payer: Self-pay | Admitting: *Deleted

## 2016-05-19 ENCOUNTER — Ambulatory Visit: Payer: Medicaid Other

## 2016-05-19 ENCOUNTER — Ambulatory Visit: Payer: Medicaid Other | Admitting: Certified Registered"

## 2016-05-19 ENCOUNTER — Encounter
Admission: RE | Disposition: A | Discharge status: Home / Self Care | Payer: Self-pay | Source: Ambulatory Visit | Attending: Surgery

## 2016-05-19 DIAGNOSIS — N289 Disorder of kidney and ureter, unspecified: Secondary | ICD-10-CM | POA: Diagnosis not present

## 2016-05-19 DIAGNOSIS — E119 Type 2 diabetes mellitus without complications: Secondary | ICD-10-CM | POA: Diagnosis not present

## 2016-05-19 DIAGNOSIS — I10 Essential (primary) hypertension: Secondary | ICD-10-CM | POA: Diagnosis not present

## 2016-05-19 DIAGNOSIS — F329 Major depressive disorder, single episode, unspecified: Secondary | ICD-10-CM | POA: Insufficient documentation

## 2016-05-19 DIAGNOSIS — K8020 Calculus of gallbladder without cholecystitis without obstruction: Secondary | ICD-10-CM

## 2016-05-19 DIAGNOSIS — Z7984 Long term (current) use of oral hypoglycemic drugs: Secondary | ICD-10-CM | POA: Diagnosis not present

## 2016-05-19 DIAGNOSIS — D649 Anemia, unspecified: Secondary | ICD-10-CM | POA: Insufficient documentation

## 2016-05-19 DIAGNOSIS — K8010 Calculus of gallbladder with chronic cholecystitis without obstruction: Secondary | ICD-10-CM | POA: Diagnosis not present

## 2016-05-19 HISTORY — PX: CHOLECYSTECTOMY: SHX55

## 2016-05-19 LAB — GLUCOSE, CAPILLARY
Glucose-Capillary: 119 mg/dL — ABNORMAL HIGH (ref 65–99)
Glucose-Capillary: 133 mg/dL — ABNORMAL HIGH (ref 65–99)

## 2016-05-19 LAB — POCT PREGNANCY, URINE: Preg Test, Ur: NEGATIVE

## 2016-05-19 SURGERY — LAPAROSCOPIC CHOLECYSTECTOMY WITH INTRAOPERATIVE CHOLANGIOGRAM
Anesthesia: General

## 2016-05-19 MED ORDER — FENTANYL CITRATE (PF) 100 MCG/2ML IJ SOLN
INTRAMUSCULAR | Status: AC
  Administered 2016-05-19: 25 ug via INTRAVENOUS
  Filled 2016-05-19: qty 2

## 2016-05-19 MED ORDER — FENTANYL CITRATE (PF) 100 MCG/2ML IJ SOLN
INTRAMUSCULAR | Status: DC | PRN
  Administered 2016-05-19: 100 ug via INTRAVENOUS
  Administered 2016-05-19 (×5): 50 ug via INTRAVENOUS

## 2016-05-19 MED ORDER — HYDROCODONE-ACETAMINOPHEN 5-325 MG PO TABS
ORAL_TABLET | ORAL | Status: AC
  Administered 2016-05-19: 1 via ORAL
  Filled 2016-05-19: qty 1

## 2016-05-19 MED ORDER — FAMOTIDINE 20 MG PO TABS
20.0000 mg | ORAL_TABLET | Freq: Once | ORAL | Status: AC
  Administered 2016-05-19: 20 mg via ORAL

## 2016-05-19 MED ORDER — LIDOCAINE HCL (CARDIAC) 20 MG/ML IV SOLN
INTRAVENOUS | Status: DC | PRN
  Administered 2016-05-19: 50 mg via INTRAVENOUS

## 2016-05-19 MED ORDER — KETOROLAC TROMETHAMINE 30 MG/ML IJ SOLN
INTRAMUSCULAR | Status: DC | PRN
  Administered 2016-05-19: 30 mg via INTRAVENOUS

## 2016-05-19 MED ORDER — ONDANSETRON HCL 4 MG/2ML IJ SOLN
INTRAMUSCULAR | Status: DC | PRN
Start: 2016-05-19 — End: 2016-05-19
  Administered 2016-05-19: 4 mg via INTRAVENOUS

## 2016-05-19 MED ORDER — ONDANSETRON HCL 4 MG/2ML IJ SOLN
4.0000 mg | Freq: Once | INTRAMUSCULAR | Status: DC | PRN
Start: 2016-05-19 — End: 2016-05-19

## 2016-05-19 MED ORDER — HYDROCODONE-ACETAMINOPHEN 5-325 MG PO TABS: 1.0000 | ORAL_TABLET | ORAL | 0 refills | Status: DC | PRN

## 2016-05-19 MED ORDER — ACETAMINOPHEN 10 MG/ML IV SOLN
INTRAVENOUS | Status: DC | PRN
  Administered 2016-05-19: 1000 mg via INTRAVENOUS

## 2016-05-19 MED ORDER — HYDROCODONE-ACETAMINOPHEN 5-325 MG PO TABS
1.0000 | ORAL_TABLET | ORAL | Status: DC | PRN
  Administered 2016-05-19: 1 via ORAL

## 2016-05-19 MED ORDER — GLYCOPYRROLATE 0.2 MG/ML IJ SOLN
INTRAMUSCULAR | Status: DC | PRN
  Administered 2016-05-19: 0.6 mg via INTRAVENOUS

## 2016-05-19 MED ORDER — SODIUM CHLORIDE 0.9 % IV SOLN
INTRAVENOUS | Status: DC | PRN
  Administered 2016-05-19: 10 mL

## 2016-05-19 MED ORDER — ALBUTEROL SULFATE HFA 108 (90 BASE) MCG/ACT IN AERS
INHALATION_SPRAY | RESPIRATORY_TRACT | Status: DC | PRN
  Administered 2016-05-19: 4 via RESPIRATORY_TRACT

## 2016-05-19 MED ORDER — HEPARIN SODIUM (PORCINE) 5000 UNIT/ML IJ SOLN
INTRAMUSCULAR | Status: AC
  Filled 2016-05-19: qty 1

## 2016-05-19 MED ORDER — ACETAMINOPHEN 10 MG/ML IV SOLN
INTRAVENOUS | Status: AC
  Filled 2016-05-19: qty 100

## 2016-05-19 MED ORDER — FENTANYL CITRATE (PF) 100 MCG/2ML IJ SOLN
25.0000 ug | INTRAMUSCULAR | Status: DC | PRN
  Administered 2016-05-19 (×4): 25 ug via INTRAVENOUS

## 2016-05-19 MED ORDER — MIDAZOLAM HCL 2 MG/2ML IJ SOLN
INTRAMUSCULAR | Status: DC | PRN
  Administered 2016-05-19: 2 mg via INTRAVENOUS

## 2016-05-19 MED ORDER — PROPOFOL 10 MG/ML IV BOLUS
INTRAVENOUS | Status: DC | PRN
  Administered 2016-05-19: 200 mg via INTRAVENOUS

## 2016-05-19 MED ORDER — ROCURONIUM BROMIDE 100 MG/10ML IV SOLN
INTRAVENOUS | Status: DC | PRN
Start: 2016-05-19 — End: 2016-05-19
  Administered 2016-05-19: 5 mg via INTRAVENOUS
  Administered 2016-05-19: 10 mg via INTRAVENOUS
  Administered 2016-05-19: 5 mg via INTRAVENOUS
  Administered 2016-05-19: 40 mg via INTRAVENOUS

## 2016-05-19 MED ORDER — NEOSTIGMINE METHYLSULFATE 10 MG/10ML IV SOLN
INTRAVENOUS | Status: DC | PRN
  Administered 2016-05-19: 3 mg via INTRAVENOUS

## 2016-05-19 MED ORDER — FAMOTIDINE 20 MG PO TABS
ORAL_TABLET | ORAL | Status: AC
  Administered 2016-05-19: 20 mg via ORAL
  Filled 2016-05-19: qty 1

## 2016-05-19 MED ORDER — SODIUM CHLORIDE 0.9 % IV SOLN
INTRAVENOUS | Status: DC
  Administered 2016-05-19: 10:00:00 via INTRAVENOUS

## 2016-05-19 MED ORDER — DEXAMETHASONE SODIUM PHOSPHATE 10 MG/ML IJ SOLN
INTRAMUSCULAR | Status: DC | PRN
  Administered 2016-05-19: 4 mg via INTRAVENOUS

## 2016-05-19 SURGICAL SUPPLY — 35 items
APPLIER CLIP ROT 10 11.4 M/L (STAPLE) ×2
CANISTER SUCT 1200ML W/VALVE (MISCELLANEOUS) ×2 IMPLANT
CANNULA DILATOR 10 W/SLV (CANNULA) ×2 IMPLANT
CATH REDDICK CHOLANGI 4FR 50CM (CATHETERS) ×2 IMPLANT
CHLORAPREP W/TINT 26ML (MISCELLANEOUS) ×2 IMPLANT
CLIP APPLIE ROT 10 11.4 M/L (STAPLE) ×1 IMPLANT
DRAPE SHEET LG 3/4 BI-LAMINATE (DRAPES) ×2 IMPLANT
ELECT REM PT RETURN 9FT ADLT (ELECTROSURGICAL) ×2
ELECTRODE REM PT RTRN 9FT ADLT (ELECTROSURGICAL) ×1 IMPLANT
GAUZE SPONGE 4X4 12PLY STRL (GAUZE/BANDAGES/DRESSINGS) ×2 IMPLANT
GLOVE BIO SURGEON STRL SZ7.5 (GLOVE) ×2 IMPLANT
GOWN STRL REUS W/ TWL LRG LVL3 (GOWN DISPOSABLE) ×4 IMPLANT
GOWN STRL REUS W/TWL LRG LVL3 (GOWN DISPOSABLE) ×4
IRRIGATION STRYKERFLOW (MISCELLANEOUS) ×1 IMPLANT
IRRIGATOR STRYKERFLOW (MISCELLANEOUS) ×2
IV NS 1000ML (IV SOLUTION) ×1
IV NS 1000ML BAXH (IV SOLUTION) ×1 IMPLANT
KIT RM TURNOVER STRD PROC AR (KITS) ×2 IMPLANT
LABEL OR SOLS (LABEL) ×2 IMPLANT
NDL INSUFF ACCESS 14 VERSASTEP (NEEDLE) ×2 IMPLANT
NEEDLE FILTER BLUNT 18X 1/2SAF (NEEDLE) ×1
NEEDLE FILTER BLUNT 18X1 1/2 (NEEDLE) ×1 IMPLANT
NS IRRIG 500ML POUR BTL (IV SOLUTION) ×2 IMPLANT
PACK LAP CHOLECYSTECTOMY (MISCELLANEOUS) ×2 IMPLANT
SCISSORS METZENBAUM CVD 33 (INSTRUMENTS) ×2 IMPLANT
SEAL FOR SCOPE WARMER C3101 (MISCELLANEOUS) ×2 IMPLANT
SLEEVE ENDOPATH XCEL 5M (ENDOMECHANICALS) ×2 IMPLANT
STRIP CLOSURE SKIN 1/2X4 (GAUZE/BANDAGES/DRESSINGS) ×2 IMPLANT
SUT CHROMIC 5 0 RB 1 27 (SUTURE) ×2 IMPLANT
SUT VIC AB 0 CT2 27 (SUTURE) IMPLANT
SYR 3ML LL SCALE MARK (SYRINGE) ×2 IMPLANT
TROCAR XCEL NON-BLD 11X100MML (ENDOMECHANICALS) ×2 IMPLANT
TROCAR XCEL NON-BLD 5MMX100MML (ENDOMECHANICALS) ×2 IMPLANT
TUBING INSUFFLATOR HI FLOW (MISCELLANEOUS) ×2 IMPLANT
WATER STERILE IRR 1000ML POUR (IV SOLUTION) ×2 IMPLANT

## 2016-05-19 NOTE — H&P (Signed)
  She comes in today for laparoscopic cholecystectomy. I reviewed her history of right upper quadrant pains and findings of gallstones.  She also developed a cold last week but is improving. She saw her primary care physician 2 days ago and was given a prescription for Qvar inhalers used twice a day. Also taking Tessalon Perles as cough medicine which is helping. She feels with cold as mostly resolved.  On examination she is awake alert and oriented and in no acute distress. Lung sounds are clear. Pharynx appears clear.  Diagnosis chronic cholecystitis cholelithiasis  I discussed the plan for laparoscopic cholecystectomy.

## 2016-05-19 NOTE — Op Note (Signed)
OPERATIVE REPORT  PREOPERATIVE DIAGNOSIS:  Chronic cholecystitis cholelithiasis  POSTOPERATIVE DIAGNOSIS: Chronic cholecystitis cholelithiasis  PROCEDURE: Laparoscopic cholecystectomy with cholangiogram  ANESTHESIA: General  SURGEON: Penny RollsWilton Jaretssi Kraker M.D.  INDICATIONS: She has history of epigastric pains and ultrasound findings of gallstones. Surgery was recommended for definitive treatment.  With the patient on the operating table in the supine position under general endotracheal anesthesia the abdomen was prepared with ChloraPrep solution and draped in a sterile manner. A short incision was made in the inferior aspect of the umbilicus and carried down to the deep fascia which was grasped with a laryngeal hook. A Veress needle was inserted aspirated and irrigated with a saline solution. The peritoneal cavity was insufflated with carbon dioxide. The Veress needle was removed. The 10 mm cannula was inserted. The 10 mm 0 laparoscope was inserted to view the peritoneal cavity.  Another incision was made in the epigastrium slightly to the right of the midline to introduce an 11 mm cannula. 2 incisions were made in the lateral aspect of the right upper quadrant to introduce 2   5 mm cannulas. Initial inspection revealed a smooth surface of the liver and a partially intrahepatic gallbladder. Brief survey of stomach and intestines appeared typical.  The gallbladder was retracted towards the right shoulder.  The gallbladder neck was retracted inferiorly and laterally.  The porta hepatis was identified. The gallbladder was mobilized with incision of the visceral peritoneum. The cystic duct was dissected free from surrounding structures. The cystic artery was dissected free from surrounding structures. A critical view of safety was demonstrated  An Endo Clip was placed across the cystic duct adjacent to the gallbladder neck. An incision was made in the cystic duct to introduce a Reddick catheter. The  cholangiogram was done with injection of half-strength Conray 60 dye. This demonstrated the bile ducts and flow of dye into the duodenum. No retained stones were identified. The cholangiogram appeared normal. The Reddick catheter was removed. The cystic duct was doubly ligated with endoclips and divided. The cystic artery was controlled with double endoclips and divided. The gallbladder was dissected free from the liver with use of hook and cautery and blunt dissection. Bleeding was minimal and hemostasis was intact. The gallbladder was delivered up through the infraumbilical incision opened and suctioned.  2 large stones were removed with the stone scoop. The gallbladder was then delivered up out of the abdomen and submitted in formalin with stones for routine pathology. The right upper quadrant was further inspected and hemostasis was intact. The cannulas were removed allowing carbon dioxide to escape from the peritoneal cavity. There was minimal bleeding from subcutaneous tissues which was cauterized and resolved. The skin incisions were closed with interrupted 5-0 chromic subcutaneous suture benzoin and Steri-Strips. Gauze dressings were applied with paper tape.  The patient appeared to be in satisfactory condition and was prepared for transfer to the recovery room  Penny Gonzalez M.D.

## 2016-05-19 NOTE — Discharge Instructions (Addendum)
AMBULATORY SURGERY  DISCHARGE INSTRUCTIONS   1) The drugs that you were given will stay in your system until tomorrow so for the next 24 hours you should not:  A) Drive an automobile B) Make any legal decisions C) Drink any alcoholic beverage   2) You may resume regular meals tomorrow.  Today it is better to start with liquids and gradually work up to solid foods.  You may eat anything you prefer, but it is better to start with liquids, then soup and crackers, and gradually work up to solid foods.   3) Please notify your doctor immediately if you have any unusual bleeding, trouble breathing, redness and pain at the surgery site, drainage, fever, or pain not relieved by medication.    4) Additional Instructions: Make sure you do get up and walk around.  Keep your TED stockings on during the day, take them off at bedtime.  Put them back on in the morning until you are up and around.  Make sure to take stool softeners when you take narcotics. Use a support pillow to hold firm support against your abdomen when coughing, sneezing, laughing.  Please contact your physician with any problems or Same Day Surgery at [phone], Monday through Friday 6 am to 4 pm, or [place] [facility] Main number at [phone].Take Tylenol or Norco if needed for pain.  Should not drive or do anything dangerous when taking Norco.  Remove dressings on Saturday. May then shower and blot dry.  Steri-Strips will likely stick for 1 or 2 weeks and eventually start coming off.

## 2016-05-19 NOTE — Transfer of Care (Signed)
Immediate Anesthesia Transfer of Care Note  Patient: Penny Gonzalez  Procedure(s) Performed: Procedure(s): LAPAROSCOPIC CHOLECYSTECTOMY WITH INTRAOPERATIVE CHOLANGIOGRAM (N/A)  Patient Location: PACU  Anesthesia Type:General  Level of Consciousness: sedated and responds to stimulation  Airway & Oxygen Therapy: Patient Spontanous Breathing and Patient connected to face mask oxygen  Post-op Assessment: Report given to RN and Post -op Vital signs reviewed and stable  Post vital signs: Reviewed  Last Vitals:  Vitals:   05/19/16 0948 05/19/16 1310  BP: 134/84 115/64  Pulse: 85 (!) 101  Resp: 16 (!) 21  Temp: 36.7 C     Last Pain:  Vitals:   05/19/16 0948  TempSrc: Oral         Complications: No apparent anesthesia complications

## 2016-05-19 NOTE — Anesthesia Procedure Notes (Signed)
Procedure Name: Intubation Performed by: Amire Leazer Pre-anesthesia Checklist: Patient identified, Patient being monitored, Timeout performed, Emergency Drugs available and Suction available Patient Re-evaluated:Patient Re-evaluated prior to inductionOxygen Delivery Method: Circle system utilized Preoxygenation: Pre-oxygenation with 100% oxygen Intubation Type: IV induction Ventilation: Mask ventilation without difficulty Laryngoscope Size: Miller and 2 Grade View: Grade I Tube type: Oral Tube size: 7.0 mm Number of attempts: 1 Airway Equipment and Method: Stylet Placement Confirmation: ETT inserted through vocal cords under direct vision,  positive ETCO2 and breath sounds checked- equal and bilateral Secured at: 21 cm Tube secured with: Tape Dental Injury: Teeth and Oropharynx as per pre-operative assessment        

## 2016-05-19 NOTE — Anesthesia Preprocedure Evaluation (Signed)
Anesthesia Evaluation  Patient identified by MRN, date of birth, ID band Patient awake    Reviewed: Allergy & Precautions, NPO status , Patient's Chart, lab work & pertinent test results  History of Anesthesia Complications Negative for: history of anesthetic complications  Airway Mallampati: II       Dental   Pulmonary neg pulmonary ROS,           Cardiovascular hypertension, Pt. on medications      Neuro/Psych Depression    GI/Hepatic negative GI ROS, Neg liver ROS,   Endo/Other  diabetes, Type 2, Oral Hypoglycemic Agents  Renal/GU Renal InsufficiencyRenal disease     Musculoskeletal   Abdominal   Peds  Hematology  (+) anemia ,   Anesthesia Other Findings   Reproductive/Obstetrics                             Anesthesia Physical Anesthesia Plan  ASA: III  Anesthesia Plan: General   Post-op Pain Management:    Induction: Intravenous  Airway Management Planned: Oral ETT  Additional Equipment:   Intra-op Plan:   Post-operative Plan:   Informed Consent: I have reviewed the patients History and Physical, chart, labs and discussed the procedure including the risks, benefits and alternatives for the proposed anesthesia with the patient or authorized representative who has indicated his/her understanding and acceptance.     Plan Discussed with:   Anesthesia Plan Comments:         Anesthesia Quick Evaluation

## 2016-05-19 NOTE — Anesthesia Postprocedure Evaluation (Signed)
Anesthesia Post Note  Patient: Penny Gonzalez  Procedure(s) Performed: Procedure(s) (LRB): LAPAROSCOPIC CHOLECYSTECTOMY WITH INTRAOPERATIVE CHOLANGIOGRAM (N/A)  Patient location during evaluation: PACU Anesthesia Type: General Level of consciousness: awake and alert Pain management: pain level controlled Vital Signs Assessment: post-procedure vital signs reviewed and stable Respiratory status: spontaneous breathing and respiratory function stable Cardiovascular status: stable Anesthetic complications: no    Last Vitals:  Vitals:   05/19/16 1430 05/19/16 1435  BP:  135/77  Pulse: 72 81  Resp: 15 14  Temp:  36.3 C    Last Pain:  Vitals:   05/19/16 1430  TempSrc:   PainSc: 2                  Karaline Buresh K

## 2016-05-20 ENCOUNTER — Encounter: Payer: Self-pay | Admitting: Surgery

## 2016-05-20 LAB — SURGICAL PATHOLOGY

## 2016-06-07 ENCOUNTER — Telehealth: Payer: Self-pay

## 2016-06-07 ENCOUNTER — Telehealth: Payer: Self-pay | Admitting: Family Medicine

## 2016-06-07 NOTE — Telephone Encounter (Signed)
Pt would like a call back about her birth control and her menstrual.

## 2016-06-07 NOTE — Telephone Encounter (Signed)
Patient called concerning her menstrual cycle and dark brown discharge. Patient state its been happening for three days now and patient took pregnancy test it was negative. Patient also will like to see if she can get on the patch instead. She states it will help her, she's forgot a couple of times with the pill.

## 2016-06-08 MED ORDER — NORELGESTROMIN-ETH ESTRADIOL 150-35 MCG/24HR TD PTWK: 1.0000 | MEDICATED_PATCH | TRANSDERMAL | 1 refills | Status: DC

## 2016-06-08 NOTE — Telephone Encounter (Signed)
Talked with patient about her symptoms; period just darker; thinks she'd like to use patch instead of pill; if any issues, come in; we can check labs, thyroid, etc

## 2016-06-18 IMAGING — US US TRANSVAGINAL NON-OB
1 series · 14 of 25 positions shown · non-contrast
Comparison: Ob ultrasound 07/04/2014

CLINICAL DATA: 20-year-old female with irregular menses. Low back
pain. Initial encounter.



[Series 1: us transvaginal non-ob · 0.17mm/px · 14 of 101 slices shown]
[im 1/101]
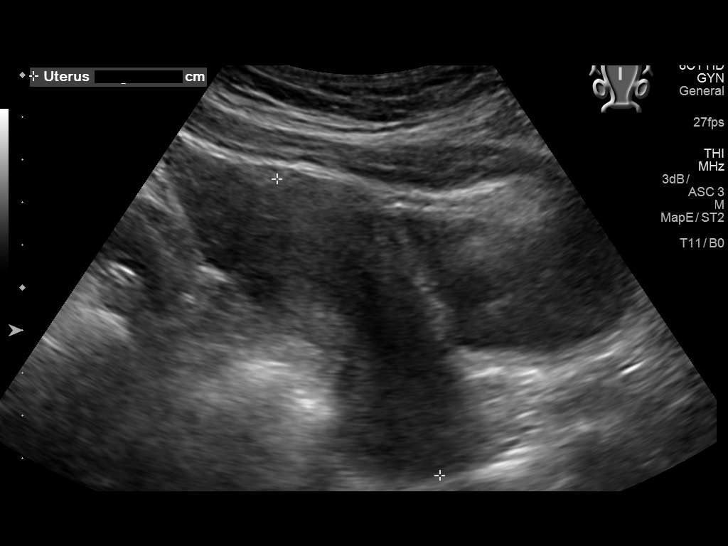
[im 9/101]
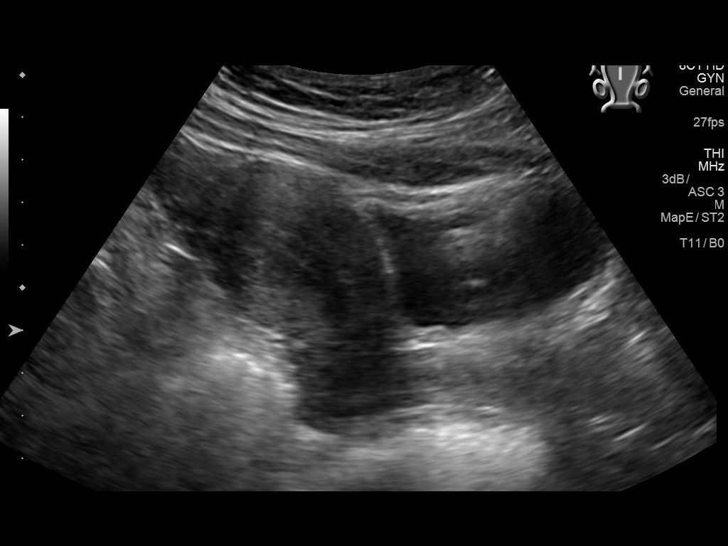
[im 17/101]
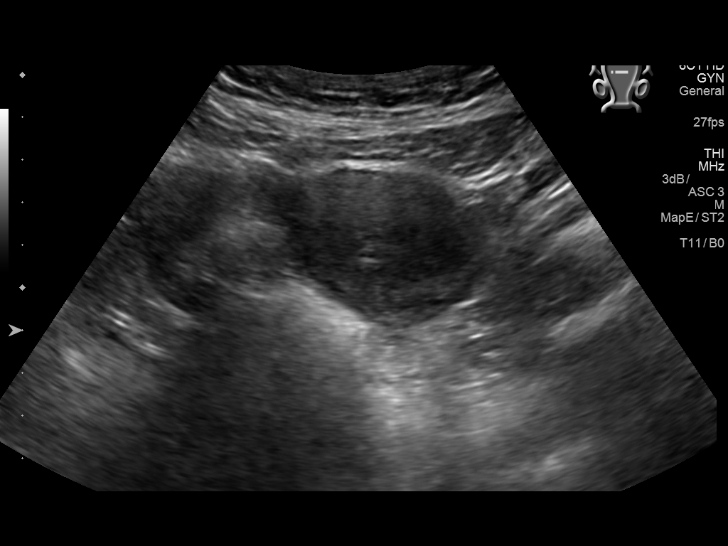
[im 26/101]
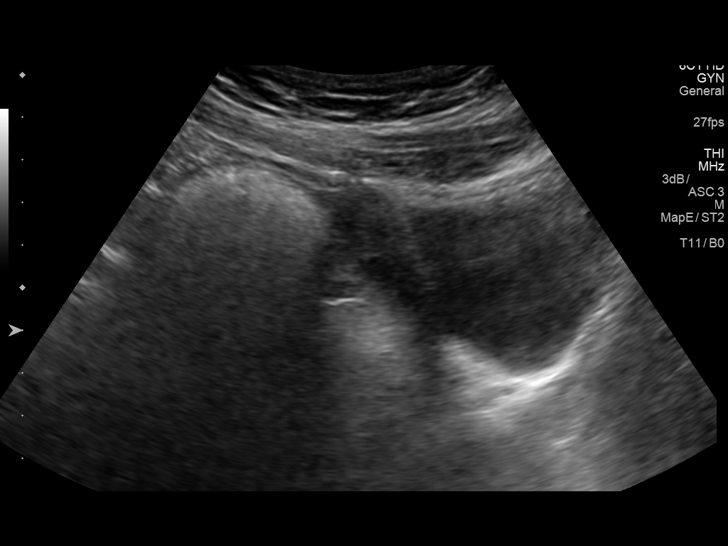
[im 34/101]
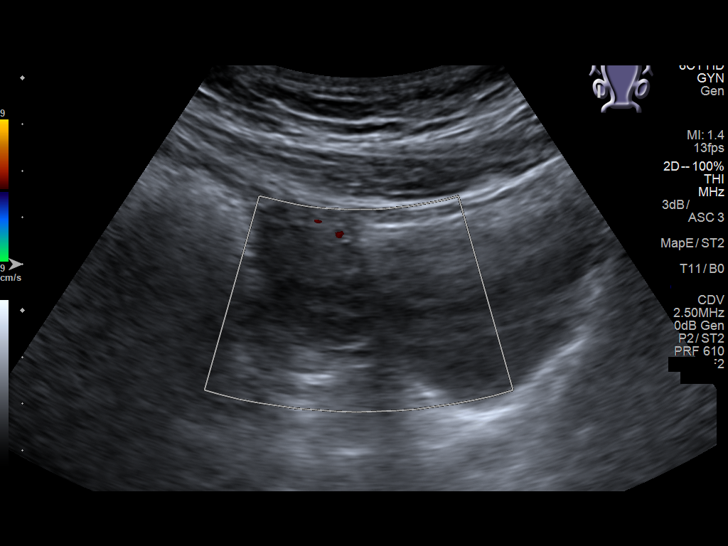
[im 38/101]
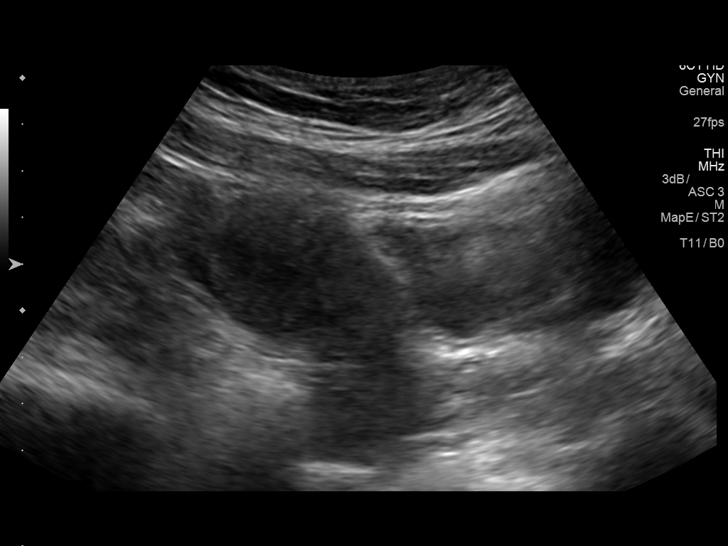
[im 46/101]
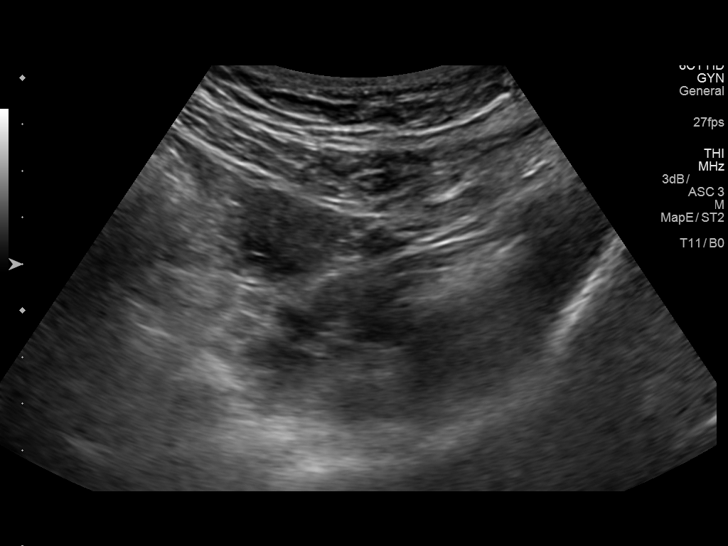
[im 55/101]
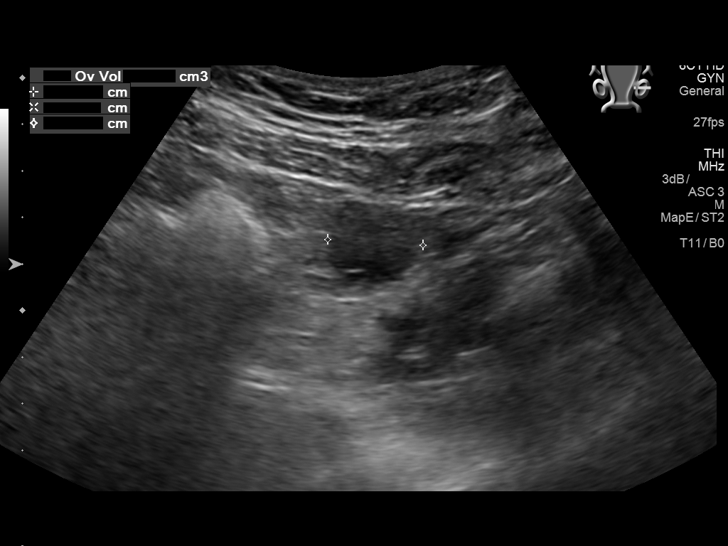
[im 63/101]
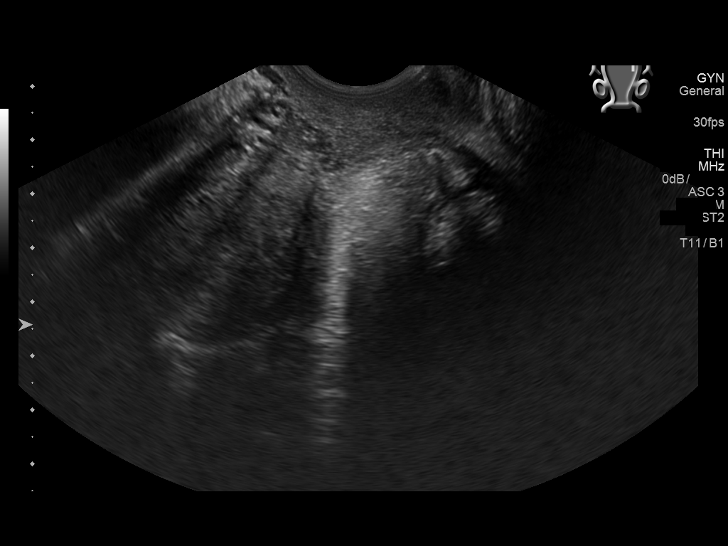
[im 67/101]
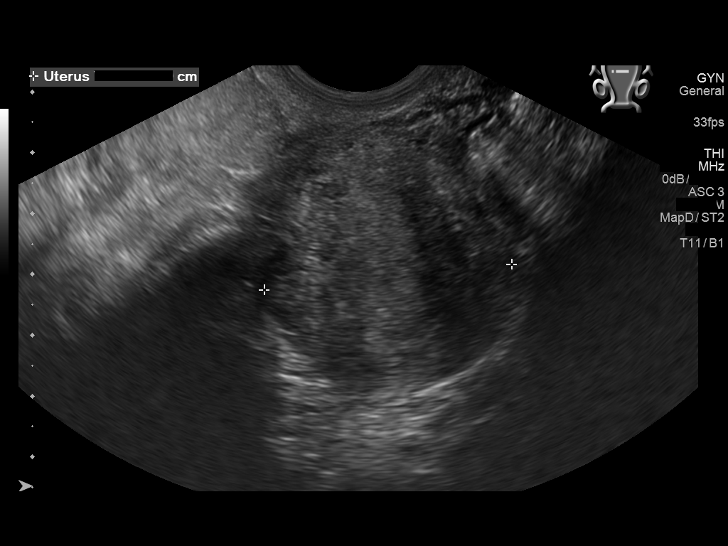
[im 76/101]
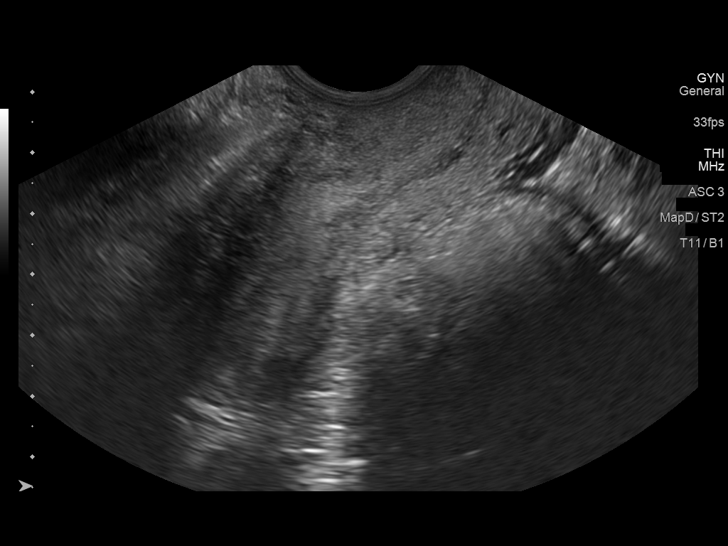
[im 84/101]
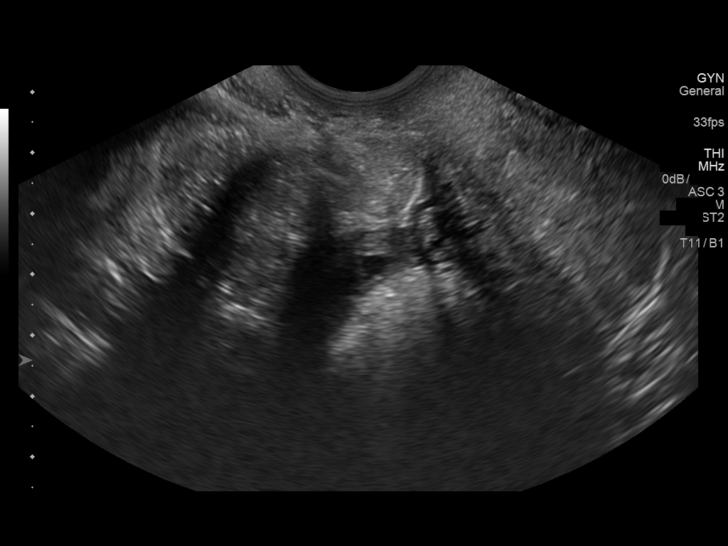
[im 92/101]
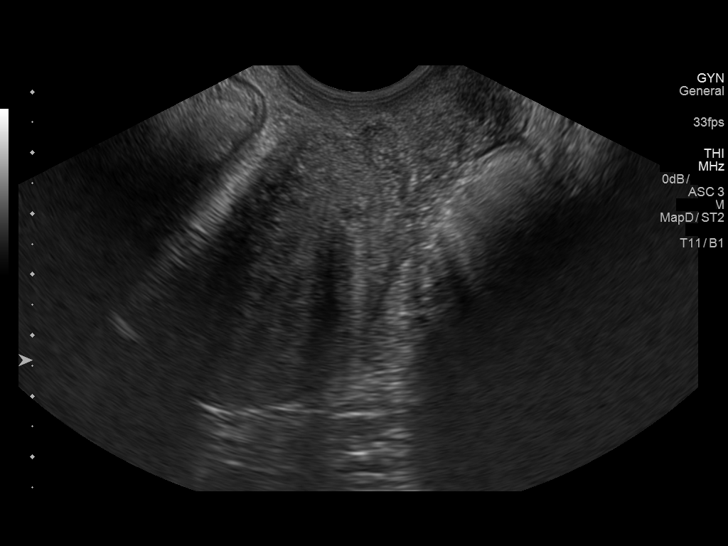
[im 101/101]
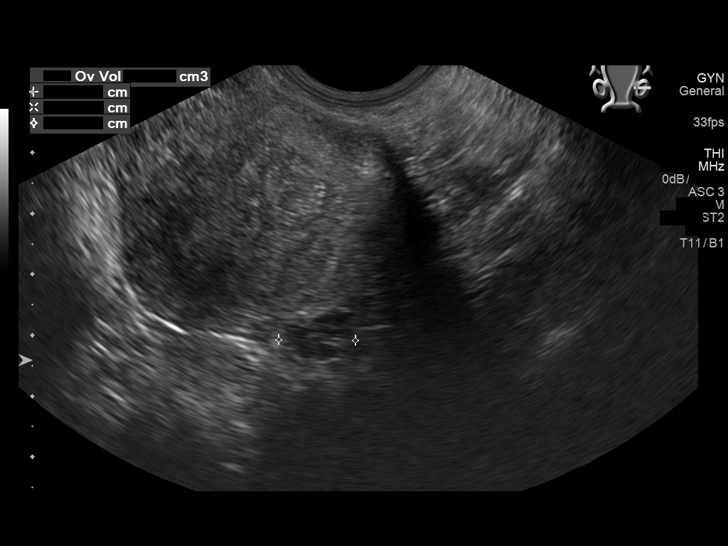

[14 of 25 positions shown; findings below may reference images not displayed]

FINDINGS: Uterus

Measurements: 8.1 x 3.7 x 4.0 cm. No fibroids or other mass
visualized. Mildly retroverted.

Endometrium

Thickness: 5 mm.  No focal abnormality visualized.

Right ovary

Measurements: 3.2 x 1.6 x 2.3 cm. Normal small follicles. Normal
appearance/no adnexal mass.

Left ovary

Measurements: 2.4 x 1.0 x 1.3 cm. Normal small follicles.. Normal
appearance/no adnexal mass.

Other findings

No abnormal free fluid.
IMPRESSION: Normal ultrasound appearance of the pelvis.

## 2016-06-30 ENCOUNTER — Ambulatory Visit: Payer: Medicaid Other | Admitting: Family Medicine

## 2016-07-04 ENCOUNTER — Encounter: Payer: Self-pay | Admitting: Family Medicine

## 2016-07-04 MED ORDER — METFORMIN HCL ER 500 MG PO TB24: 1000.0000 mg | ORAL_TABLET | Freq: Every day | ORAL | 1 refills | Status: DC

## 2016-07-11 ENCOUNTER — Ambulatory Visit (INDEPENDENT_AMBULATORY_CARE_PROVIDER_SITE_OTHER): Payer: Medicaid Other | Admitting: Family Medicine

## 2016-07-11 ENCOUNTER — Encounter: Payer: Self-pay | Admitting: Family Medicine

## 2016-07-11 DIAGNOSIS — I10 Essential (primary) hypertension: Secondary | ICD-10-CM

## 2016-07-11 DIAGNOSIS — K760 Fatty (change of) liver, not elsewhere classified: Secondary | ICD-10-CM | POA: Diagnosis not present

## 2016-07-11 DIAGNOSIS — E669 Obesity, unspecified: Secondary | ICD-10-CM

## 2016-07-11 DIAGNOSIS — R808 Other proteinuria: Secondary | ICD-10-CM | POA: Diagnosis not present

## 2016-07-11 DIAGNOSIS — E559 Vitamin D deficiency, unspecified: Secondary | ICD-10-CM

## 2016-07-11 DIAGNOSIS — Z5181 Encounter for therapeutic drug level monitoring: Secondary | ICD-10-CM

## 2016-07-11 DIAGNOSIS — R1031 Right lower quadrant pain: Secondary | ICD-10-CM | POA: Diagnosis not present

## 2016-07-11 DIAGNOSIS — E1165 Type 2 diabetes mellitus with hyperglycemia: Secondary | ICD-10-CM | POA: Diagnosis not present

## 2016-07-11 LAB — POCT URINE PREGNANCY: Preg Test, Ur: NEGATIVE

## 2016-07-11 NOTE — Progress Notes (Signed)
BP 128/86   Pulse 100   Temp 99.1 F (37.3 C) (Oral)   Resp 16   Wt 228 lb 3 oz (103.5 kg)   LMP 07/04/2016   SpO2 96%   BMI 33.70 kg/m    Subjective:    Patient ID: Penny Gonzalez, female    DOB: 12/25/1995, 21 y.o.   MRN: [id number]  HPI: Penny Gonzalez is a 21 y.o. female  Chief Complaint  Patient presents with  . Follow-up    3 month   . GI Problem    lower stomach pain, nausea and diarrhea   She is here for follow-up She has type 2 diabetes; she had been off of metformin for a few weeks; now back on She has been eating well, doing what she's supposed to be doing Cut out sodas completely Drinking more water Due for A1c today Last intake was a lollipop from out front, nothing else since morning  Lab Results  Component Value Date   HGBA1C 7.0 (H) 07/11/2016   Stomach upset; want a pregnancy test today; some discomfort on the right side on the outside; thinks it might be an after effect from surgery Choly went well  She had a cold sore the other day and the dentist said they can't treat her that day; no lesions down below  Depression screen [facility] 2/9 07/11/2016 05/16/2016 03/24/2016 02/19/2016 01/21/2016  Decreased Interest 0 0 0 0 1  Down, Depressed, Hopeless 0 0 0 0 1  PHQ - 2 Score 0 0 0 0 2  Altered sleeping - - - - 3  Tired, decreased energy - - - - 3  Change in appetite - - - - 1  Feeling bad or failure about yourself  - - - - 1  Trouble concentrating - - - - 1  Moving slowly or fidgety/restless - - - - 1  Suicidal thoughts - - - - 0  PHQ-9 Score - - - - 12  Difficult doing work/chores - - - - Somewhat difficult   Relevant past medical, surgical, family and social history reviewed Past Medical History:  Diagnosis Date  . Anemia, iron deficiency, inadequate dietary intake 12/01/2014  . Bronchitis 04/2016   dr Milta Croson cleared pt medically for surgery after cxr results were normal (05-16-16)  . Cold 05/12/2016   RESOLVING PER PT  . Depression    patient has not been officially diagnosed but has symptoms  . Diabetes mellitus without complication (HCC)   . Essential hypertension, benign 03/31/2016  . Essential hypertension, benign 03/31/2016  . Irregular periods/menstrual cycles   . Joint pain of leg   . Major depression 11/23/2015   Past Surgical History:  Procedure Laterality Date  . CHOLECYSTECTOMY N/A 05/19/2016   Procedure: LAPAROSCOPIC CHOLECYSTECTOMY WITH INTRAOPERATIVE CHOLANGIOGRAM;  Surgeon: Nadeen LandauJarvis Wilton Smith, MD;  Location: [facility] ORS;  Service: General;  Laterality: N/A;   Social History  Substance Use Topics  . Smoking status: Never Smoker  . Smokeless tobacco: Never Used  . Alcohol use 0.0 oz/week     Comment: OCC   Interim medical history since last visit reviewed. Allergies and medications reviewed  Review of Systems Per HPI unless specifically indicated above     Objective:    BP 128/86   Pulse 100   Temp 99.1 F (37.3 C) (Oral)   Resp 16   Wt 228 lb 3 oz (103.5 kg)   LMP 07/04/2016   SpO2 96%   BMI 33.70 kg/m  Wt Readings from Last 3 Encounters:  07/11/16 228 lb 3 oz (103.5 kg)  05/19/16 236 lb (107 kg)  05/16/16 236 lb 4 oz (107.2 kg)    Physical Exam  Constitutional: She appears well-developed and well-nourished. No distress.  Obese, but weight down nearly 8 pounds over last 7 weeks  HENT:  Mouth/Throat: Mucous membranes are normal.  Eyes: EOM are normal. No scleral icterus.  Neck: No thyromegaly present.  Cardiovascular: Normal rate and regular rhythm.   Pulmonary/Chest: Effort normal and breath sounds normal. No respiratory distress.  Abdominal: She exhibits no distension.  Musculoskeletal: She exhibits no edema.  Neurological: She is alert.  Skin: No pallor.  Psychiatric: She has a normal mood and affect. Her behavior is normal.    Diabetic Foot Form - Detailed   Diabetic Foot Exam - detailed Diabetic Foot exam was performed with the following findings:  Yes 07/11/2016 10:01 AM    Visual Foot Exam completed.:  Yes  Are the toenails ingrown?:  No Normal Range of Motion:  Yes Pulse Foot Exam completed.:  Yes  Right Dorsalis Pedis:  Present Left Dorsalis Pedis:  Present  Sensory Foot Exam Completed.:  Yes Swelling:  No Semmes-Weinstein Monofilament Test R Site 1-Great Toe:  Pos L Site 1-Great Toe:  Pos  R Site 4:  Pos L Site 4:  Pos  R Site 5:  Pos L Site 5:  Pos          Assessment & Plan:   Problem List Items Addressed This Visit      Cardiovascular and Mediastinum   Essential hypertension, benign (Chronic)    Controlled; try to follow DASH guidelines        Digestive   Fatty liver (Chronic)    Check liver enzymes; expect these to be coming down with her weight loss      Relevant Orders   COMPLETE METABOLIC PANEL WITH GFR (Completed)     Endocrine   Diabetes mellitus type 2, controlled (HCC) (Chronic)    Check A1c today; foot exam by MD today; eye exam UTD; no need for FSBS per ACE guidelines      Relevant Orders   Hemoglobin A1c (Completed)   Microalbumin / creatinine urine ratio (Completed)   Lipid panel (Completed)     Other   Vitamin D deficiency    Check level today      Relevant Orders   VITAMIN D 25 Hydroxy (Vit-D Deficiency, Fractures) (Completed)   Proteinuria    Check urine microalbumin:creatinine      Relevant Orders   Microalbumin / creatinine urine ratio (Completed)   Obesity (BMI 30.0-34.9)    Praised patient for weight loss      Medication monitoring encounter    Check labs      Relevant Orders   COMPLETE METABOLIC PANEL WITH GFR (Completed)   Abdominal pain, RLQ    Check urine hCG at patient's request      Relevant Orders   COMPLETE METABOLIC PANEL WITH GFR (Completed)   POCT urine pregnancy (Completed)       Follow up plan: Return in about 3 months (around 10/09/2016) for fasting labs and visit.  An after-visit summary was printed and given to the patient at check-out.  Please see the patient  instructions which may contain other information and recommendations beyond what is mentioned above in the assessment and plan.  No orders of the defined types were placed in this encounter.   Orders Placed This Encounter  Procedures  . Hemoglobin A1c  . Microalbumin / creatinine urine ratio  . Lipid panel  . VITAMIN D 25 Hydroxy (Vit-D Deficiency, Fractures)  . COMPLETE METABOLIC PANEL WITH GFR  . POCT urine pregnancy

## 2016-07-11 NOTE — Assessment & Plan Note (Signed)
Check level today 

## 2016-07-11 NOTE — Assessment & Plan Note (Signed)
Check urine hCG at patient's request

## 2016-07-11 NOTE — Patient Instructions (Signed)
We'll get labs today Please do see your eye doctor regularly, and have your eyes examined every year (or more often per his or her recommendation) Check your feet every night and let me know right away of any sores, infections, numbness, etc. Try to limit sweets, white bread, white rice, white potatoes It is okay with me for you to not check your fingerstick blood sugars (per [employer] Best Practices), unless you are interested and feel it would be helpful for you

## 2016-07-11 NOTE — Assessment & Plan Note (Signed)
Check liver enzymes; expect these to be coming down with her weight loss

## 2016-07-11 NOTE — Assessment & Plan Note (Signed)
Check labs 

## 2016-07-11 NOTE — Assessment & Plan Note (Signed)
Check urine microalbumin:creatinine 

## 2016-07-11 NOTE — Assessment & Plan Note (Signed)
Controlled; try to follow DASH guidelines 

## 2016-07-11 NOTE — Assessment & Plan Note (Signed)
Check A1c today; foot exam by MD today; eye exam UTD; no need for FSBS per ACE guidelines

## 2016-07-11 NOTE — Assessment & Plan Note (Signed)
Praised patient for weight loss 

## 2016-07-12 ENCOUNTER — Other Ambulatory Visit: Payer: Self-pay | Admitting: Family Medicine

## 2016-07-12 LAB — MICROALBUMIN / CREATININE URINE RATIO
Creatinine, Urine: 129 mg/dL (ref 20–320)
MICROALB UR: 1.2 mg/dL
MICROALB/CREAT RATIO: 9 ug/mg{creat} (ref <30)

## 2016-07-12 LAB — COMPLETE METABOLIC PANEL WITH GFR
ALT: 17 U/L (ref 6–29)
AST: 14 U/L (ref 10–30)
Albumin: 4.3 g/dL (ref 3.6–5.1)
Alkaline Phosphatase: 51 U/L (ref 33–115)
BILIRUBIN TOTAL: 0.4 mg/dL (ref 0.2–1.2)
BUN: 7 mg/dL (ref 7–25)
CO2: 28 mmol/L (ref 20–31)
CREATININE: 0.62 mg/dL (ref 0.50–1.10)
Calcium: 9.4 mg/dL (ref 8.6–10.2)
Chloride: 104 mmol/L (ref 98–110)
GFR, Est African American: >89 mL/min (ref >=60)
GFR, Est Non African American: >89 mL/min (ref >=60)
GLUCOSE: 92 mg/dL (ref 65–99)
Potassium: 4.2 mmol/L (ref 3.5–5.3)
SODIUM: 142 mmol/L (ref 135–146)
Total Protein: 7.2 g/dL (ref 6.1–8.1)

## 2016-07-12 LAB — LIPID PANEL
CHOLESTEROL: 147 mg/dL (ref <200)
HDL: 58 mg/dL (ref >50)
LDL Cholesterol: 59 mg/dL (ref <100)
Total CHOL/HDL Ratio: 2.5 Ratio (ref <5.0)
Triglycerides: 148 mg/dL (ref <150)
VLDL: 30 mg/dL (ref <30)

## 2016-07-12 LAB — VITAMIN D 25 HYDROXY (VIT D DEFICIENCY, FRACTURES): VIT D 25 HYDROXY: 19 ng/mL — AB (ref 30–100)

## 2016-07-12 LAB — HEMOGLOBIN A1C
HEMOGLOBIN A1C: 7 % — AB (ref <5.7)
MEAN PLASMA GLUCOSE: 154 mg/dL

## 2016-07-12 MED ORDER — VITAMIN D (ERGOCALCIFEROL) 1.25 MG (50000 UNIT) PO CAPS: 50000.0000 [IU] | ORAL_CAPSULE | ORAL | 1 refills | Status: AC

## 2016-07-12 NOTE — Progress Notes (Signed)
Start Rx vit D weekly x 8 weeks 

## 2016-07-15 ENCOUNTER — Encounter: Payer: Self-pay | Admitting: Family Medicine

## 2016-07-30 ENCOUNTER — Other Ambulatory Visit: Payer: Self-pay | Admitting: Family Medicine

## 2016-08-01 ENCOUNTER — Telehealth: Payer: Self-pay | Admitting: Family Medicine

## 2016-08-01 DIAGNOSIS — Z111 Encounter for screening for respiratory tuberculosis: Secondary | ICD-10-CM

## 2016-08-01 NOTE — Telephone Encounter (Signed)
PT IS NEEDING A ORDER FOR TB SKIN TEST BY Thursday. COULD YOU PLEASE GET HER AN ORDER FOR THIS TO BE DONE BEFORE Thursday FOR SHE AS ORIENTATION. Please advise

## 2016-08-01 NOTE — Telephone Encounter (Signed)
TB lab work ordered and place up front

## 2016-08-03 LAB — QUANTIFERON TB GOLD ASSAY (BLOOD)
INTERFERON GAMMA RELEASE ASSAY: NEGATIVE
Mitogen-Nil: >10 IU/mL
Quantiferon Nil Value: 0.02 IU/mL
Quantiferon Tb Ag Minus Nil Value: 0 IU/mL

## 2016-08-08 ENCOUNTER — Encounter: Payer: Self-pay | Admitting: Family Medicine

## 2016-08-11 ENCOUNTER — Ambulatory Visit (INDEPENDENT_AMBULATORY_CARE_PROVIDER_SITE_OTHER): Payer: Medicaid Other | Admitting: Family Medicine

## 2016-08-11 ENCOUNTER — Encounter: Payer: Self-pay | Admitting: Family Medicine

## 2016-08-11 VITALS — BP 118/82 | HR 96 | Temp 98.3°F | Resp 16 | Wt 234.0 lb

## 2016-08-11 DIAGNOSIS — F331 Major depressive disorder, recurrent, moderate: Secondary | ICD-10-CM

## 2016-08-11 DIAGNOSIS — M25472 Effusion, left ankle: Secondary | ICD-10-CM

## 2016-08-11 MED ORDER — ESCITALOPRAM OXALATE 10 MG PO TABS: 10.0000 mg | ORAL_TABLET | Freq: Every day | ORAL | 0 refills | Status: DC

## 2016-08-11 NOTE — Patient Instructions (Addendum)
Start Lexapro (escitalopram) once a day Continue to work with your therapist Get 7 to 8 hours of sleep a night Get outdoors for at least 15 minutes a day (weather permitting), and try to walk

## 2016-08-11 NOTE — Assessment & Plan Note (Signed)
Start lexapro; refer to psychiatrist; continue therapy; safety discussed, seek help for any thoughts of self-harm or hurting others; close f/u

## 2016-08-11 NOTE — Progress Notes (Signed)
BP 118/82   Pulse 96   Temp 98.3 F (36.8 C) (Oral)   Resp 16   Wt 234 lb (106.1 kg)   LMP 07/29/2016   SpO2 97%   BMI 34.56 kg/m    Subjective:    Patient ID: Penny Gonzalez, female    DOB: 03/24/1996, 21 y.o.   MRN: [id number]  HPI: Penny Gonzalez is a 21 y.o. female  Chief Complaint  Patient presents with  . headaches  . 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; balancing between depression and anger management; focusing more on anger management right now; has to go to court for that; not seeing psychiatrist; has seen psych at [facility] before they changed; would be willing to go back No thoughts of SI or HI Can't remember names of medicines before; was on something for anxiety Depression has been a long-term problem; first struggled after high school when life really started Getting 5 hours a night of sleep  Some swelling in the left ankle; no injury  Depression screen [facility] 2/9 08/11/2016 07/11/2016 05/16/2016 03/24/2016 02/19/2016  Decreased Interest 1 0 0 0 0  Down, Depressed, Hopeless 1 0 0 0 0  PHQ - 2 Score 2 0 0 0 0  Altered sleeping 3 - - - -  Tired, decreased energy 1 - - - -  Change in appetite 0 - - - -  Feeling bad or failure about yourself  0 - - - -  Trouble concentrating 0 - - - -  Moving slowly or fidgety/restless 0 - - - -  Suicidal thoughts 0 - - - -  PHQ-9 Score 6 - - - -  Difficult doing work/chores Somewhat difficult - - - -   Relevant past medical, surgical, family and social history reviewed Past Medical History:  Diagnosis Date  . Anemia, iron deficiency, inadequate dietary intake 12/01/2014  . Bronchitis 04/2016   dr lada cleared pt medically for surgery after cxr results were normal (05-16-16)  . Cold 05/12/2016   RESOLVING PER PT  . Depression    patient  has not been officially diagnosed but has symptoms  . Diabetes mellitus without complication (HCC)   . Essential hypertension, benign 03/31/2016  . Essential hypertension, benign 03/31/2016  . Irregular periods/menstrual cycles   . Joint pain of leg   . Major depression 11/23/2015   Past Surgical History:  Procedure Laterality Date  . CHOLECYSTECTOMY N/A 05/19/2016   Procedure: LAPAROSCOPIC CHOLECYSTECTOMY WITH INTRAOPERATIVE CHOLANGIOGRAM;  Surgeon: Nadeen Landau, MD;  Location: [facility] ORS;  Service: General;  Laterality: N/A;   Social History  Substance Use Topics  . Smoking status: Never Smoker  . Smokeless tobacco: Never Used  . Alcohol use 0.0 oz/week     Comment: OCC   Interim medical history since last visit reviewed. Allergies and medications reviewed  Review of Systems Per HPI unless specifically indicated above     Objective:    BP 118/82   Pulse 96   Temp 98.3 F (36.8 C) (Oral)   Resp 16   Wt 234 lb (106.1 kg)   LMP 07/29/2016   SpO2 97%   BMI 34.56 kg/m   Wt Readings from Last 3 Encounters:  08/11/16 234 lb (106.1 kg)  07/11/16 228 lb 3  oz (103.5 kg)  05/19/16 236 lb (107 kg)    Physical Exam  Constitutional: She appears well-developed and well-nourished. No distress.  Cardiovascular: Normal rate and regular rhythm.   Pulmonary/Chest: Effort normal and breath sounds normal.  Psychiatric: Thought content is not paranoid. She exhibits a depressed mood. She expresses no homicidal and no suicidal ideation.  Tearful; good eye contact with examiner   Diabetic Foot Form - Detailed   Diabetic Foot Exam - detailed Diabetic Foot exam was performed with the following findings:  Yes 08/11/2016  9:57 AM  Visual Foot Exam completed.:  Yes  Are the toenails ingrown?:  No Normal Range of Motion:  Yes Pulse Foot Exam completed.:  Yes  Right Dorsalis Pedis:  Present Left Dorsalis Pedis:  Present  Sensory Foot Exam Completed.:  Yes Semmes-Weinstein Monofilament  Test R Site 1-Great Toe:  Pos L Site 1-Great Toe:  Pos  R Site 4:  Pos L Site 4:  Pos  R Site 5:  Pos L Site 5:  Pos        Results for orders placed or performed in visit on 08/01/16  Quantiferon tb gold assay  Result Value Ref Range   Interferon Gamma Release Assay NEGATIVE NEGATIVE   Quantiferon Nil Value 0.02 IU/mL   Mitogen-Nil >10.00 IU/mL   Quantiferon Tb Ag Minus Nil Value 0.00 IU/mL      Assessment & Plan:   Problem List Items Addressed This Visit      Other   Major depression    Start lexapro; refer to psychiatrist; continue therapy; safety discussed, seek help for any thoughts of self-harm or hurting others; close f/u      Relevant Medications   escitalopram (LEXAPRO) 10 MG tablet      Follow up plan: Return in about 3 weeks (around 09/01/2016) for medication check.  An after-visit summary was printed and given to the patient at check-out.  Please see the patient instructions which may contain other information and recommendations beyond what is mentioned above in the assessment and plan.  Meds ordered this encounter  Medications  . escitalopram (LEXAPRO) 10 MG tablet    Sig: Take 1 tablet (10 mg total) by mouth daily.    Dispense:  30 tablet    Refill:  0    No orders of the defined types were placed in this encounter.

## 2016-08-17 ENCOUNTER — Encounter: Payer: Self-pay | Admitting: Family Medicine

## 2016-08-17 ENCOUNTER — Ambulatory Visit (INDEPENDENT_AMBULATORY_CARE_PROVIDER_SITE_OTHER): Payer: Medicaid Other | Admitting: Family Medicine

## 2016-08-17 VITALS — BP 116/80 | HR 100 | Temp 98.2°F | Resp 14 | Wt 232.4 lb

## 2016-08-17 DIAGNOSIS — N760 Acute vaginitis: Secondary | ICD-10-CM

## 2016-08-17 DIAGNOSIS — B001 Herpesviral vesicular dermatitis: Secondary | ICD-10-CM | POA: Diagnosis not present

## 2016-08-17 MED ORDER — DOCOSANOL 10 % EX CREA: TOPICAL_CREAM | CUTANEOUS | 11 refills | Status: DC

## 2016-08-17 NOTE — Patient Instructions (Signed)
We'll call you tomorrow with the results

## 2016-08-17 NOTE — Progress Notes (Signed)
BP 116/80   Pulse 100   Temp 98.2 F (36.8 C) (Oral)   Resp 14   Wt 232 lb 6.4 oz (105.4 kg)   LMP 07/29/2016   SpO2 98%   BMI 34.32 kg/m    Subjective:    Patient ID: Penny Gonzalez, female    DOB: [birth year], 21 y.o.   MRN: [id number]  HPI: Penny Gonzalez is a 21 y.o. female  Chief Complaint  Patient presents with  . Vaginal Discharge    and odor   Patient is here for an acute visit; vaginal discharge Symptoms started 3 days ago; white discharge vaginally with fishy odor No OTC products No new sexual partners No fevers No itching No burning with urination  She also gets fever blisters and would like Rx for Abreva  Depression screen [facility] 2/9 08/11/2016 07/11/2016 05/16/2016 03/24/2016 02/19/2016  Decreased Interest 1 0 0 0 0  Down, Depressed, Hopeless 1 0 0 0 0  PHQ - 2 Score 2 0 0 0 0  Altered sleeping 3 - - - -  Tired, decreased energy 1 - - - -  Change in appetite 0 - - - -  Feeling bad or failure about yourself  0 - - - -  Trouble concentrating 0 - - - -  Moving slowly or fidgety/restless 0 - - - -  Suicidal thoughts 0 - - - -  PHQ-9 Score 6 - - - -  Difficult doing work/chores Somewhat difficult - - - -   Relevant past medical, surgical, family and social history reviewed Past Medical History:  Diagnosis Date  . Anemia, iron deficiency, inadequate dietary intake 12/01/2014  . Bronchitis 04/2016   dr lada cleared pt medically for surgery after cxr results were normal (05-16-16)  . Cold 05/12/2016   RESOLVING PER PT  . Depression    patient has not been officially diagnosed but has symptoms  . Diabetes mellitus without complication (HCC)   . Essential hypertension, benign 03/31/2016  . Essential hypertension, benign 03/31/2016  . Irregular periods/menstrual cycles   . Joint pain of leg   . Major depression 11/23/2015   Past Surgical History:  Procedure Laterality Date  . CHOLECYSTECTOMY N/A 05/19/2016   Procedure: LAPAROSCOPIC CHOLECYSTECTOMY  WITH INTRAOPERATIVE CHOLANGIOGRAM;  Surgeon: Nadeen Landau, MD;  Location: [facility] ORS;  Service: General;  Laterality: N/A;   Social History  Substance Use Topics  . Smoking status: Never Smoker  . Smokeless tobacco: Never Used  . Alcohol use 0.0 oz/week     Comment: OCC   Interim medical history since last visit reviewed. Allergies and medications reviewed  Review of Systems Per HPI unless specifically indicated above     Objective:    BP 116/80   Pulse 100   Temp 98.2 F (36.8 C) (Oral)   Resp 14   Wt 232 lb 6.4 oz (105.4 kg)   LMP 07/29/2016   SpO2 98%   BMI 34.32 kg/m   Wt Readings from Last 3 Encounters:  08/17/16 232 lb 6.4 oz (105.4 kg)  08/11/16 234 lb (106.1 kg)  07/11/16 228 lb 3 oz (103.5 kg)    Physical Exam  Constitutional: She appears well-developed and well-nourished. No distress.  Eyes: EOM are normal. No scleral icterus.  Neck: No thyromegaly present.  Cardiovascular: Normal rate.   Pulmonary/Chest: Effort normal.  Abdominal: She exhibits no distension.  Genitourinary: No erythema, tenderness or bleeding in the vagina. No signs of injury around the vagina. Vaginal discharge  found.  Skin: No pallor.  Psychiatric: She has a normal mood and affect. Her behavior is normal. Judgment and thought content normal. Her mood appears not anxious. She does not exhibit a depressed mood.      Assessment & Plan:   Problem List Items Addressed This Visit      Digestive   Fever blister    Rx for Abreva; suggested L-lysine or B complex vitamins for prevention      Relevant Medications   Docosanol (ABREVA) 10 % CREA    Other Visit Diagnoses    Acute vaginitis    -  Primary   wet mount collected; will see what results show   Relevant Orders   WET PREP BY MOLECULAR PROBE (Completed)       Follow up plan: No Follow-up on file.  An after-visit summary was printed and given to the patient at check-out.  Please see the patient instructions which may  contain other information and recommendations beyond what is mentioned above in the assessment and plan.  Meds ordered this encounter  Medications  . Docosanol (ABREVA) 10 % CREA    Sig: Apply five times a day until blister healed    Dispense:  2 g    Refill:  11    Orders Placed This Encounter  Procedures  . WET PREP BY MOLECULAR PROBE   Just this one time, [facility] we get results she requests

## 2016-08-18 ENCOUNTER — Other Ambulatory Visit: Payer: Self-pay | Admitting: Family Medicine

## 2016-08-18 LAB — WET PREP BY MOLECULAR PROBE
CANDIDA SPECIES: NOT DETECTED
Gardnerella vaginalis: DETECTED — AB
Trichomonas vaginosis: NOT DETECTED

## 2016-08-18 MED ORDER — METRONIDAZOLE 500 MG PO TABS: 500.0000 mg | ORAL_TABLET | Freq: Two times a day (BID) | ORAL | 0 refills | Status: AC

## 2016-08-19 ENCOUNTER — Telehealth: Payer: Self-pay | Admitting: Family Medicine

## 2016-08-19 NOTE — Telephone Encounter (Signed)
Just nothing with decongestants or alcohol Plain claritin or zyrtec or benadryl or mucinex would be fine

## 2016-08-19 NOTE — Telephone Encounter (Signed)
Pt notified. Also mention to her no wine as well. And no tussin. Which is the medication she is currently on.

## 2016-08-19 NOTE — Telephone Encounter (Signed)
Pt states the medication she was put on states she cannot take any cold medication with it. Pt would like a call back to be advise as to what she can take.

## 2016-08-21 DIAGNOSIS — B001 Herpesviral vesicular dermatitis: Secondary | ICD-10-CM | POA: Insufficient documentation

## 2016-08-21 NOTE — Assessment & Plan Note (Signed)
Rx for Abreva; suggested L-lysine or B complex vitamins for prevention

## 2016-08-26 ENCOUNTER — Ambulatory Visit: Payer: Medicaid Other | Admitting: Obstetrics and Gynecology

## 2016-09-01 ENCOUNTER — Ambulatory Visit (INDEPENDENT_AMBULATORY_CARE_PROVIDER_SITE_OTHER): Payer: Medicaid Other | Admitting: Family Medicine

## 2016-09-01 ENCOUNTER — Encounter: Payer: Self-pay | Admitting: Family Medicine

## 2016-09-01 VITALS — BP 120/78 | HR 95 | Temp 97.7°F | Resp 16 | Wt 232.3 lb

## 2016-09-01 DIAGNOSIS — F3341 Major depressive disorder, recurrent, in partial remission: Secondary | ICD-10-CM

## 2016-09-01 DIAGNOSIS — Z113 Encounter for screening for infections with a predominantly sexual mode of transmission: Secondary | ICD-10-CM

## 2016-09-01 DIAGNOSIS — N760 Acute vaginitis: Secondary | ICD-10-CM | POA: Diagnosis not present

## 2016-09-01 NOTE — Progress Notes (Signed)
BP 120/78   Pulse 95   Temp 97.7 F (36.5 C) (Oral)   Resp 16   Wt 232 lb 5 oz (105.4 kg)   SpO2 97%   BMI 34.31 kg/m    Subjective:    Patient ID: Penny Gonzalez, female    DOB: Apr 19, 1996, 21 y.o.   MRN: [id number]  HPI: Penny Gonzalez is a 21 y.o. female  Chief Complaint  Patient presents with  . Follow-up    Medication check; lexapro;  irritation vagina area.   She never started the lexapro; talked to her therapist; was just going through a rough time, but doing better; her therapist did not think she needed medicine; helped her get through stuff; financial; going to be starting school in May; other issues; doing much better now  Vaginal irritation; burns a little with urination; feels almost like a cut; feels like chlamydia she had one time; itchy and wet; no extra discharge; no topical products; last intercourse was March 5th; unprotected but someone she trusts; she would like to be tested for GC, chl, and HIV  Depression screen [facility] 2/9 09/01/2016 08/11/2016 07/11/2016 05/16/2016 03/24/2016  Decreased Interest 0 1 0 0 0  Down, Depressed, Hopeless 0 1 0 0 0  PHQ - 2 Score 0 2 0 0 0  Altered sleeping - 3 - - -  Tired, decreased energy - 1 - - -  Change in appetite - 0 - - -  Feeling bad or failure about yourself  - 0 - - -  Trouble concentrating - 0 - - -  Moving slowly or fidgety/restless - 0 - - -  Suicidal thoughts - 0 - - -  PHQ-9 Score - 6 - - -  Difficult doing work/chores - Somewhat difficult - - -   Relevant past medical, surgical, family and social history reviewed Past Medical History:  Diagnosis Date  . Anemia, iron deficiency, inadequate dietary intake 12/01/2014  . Bronchitis 04/2016   dr lada cleared pt medically for surgery after cxr results were normal (05-16-16)  . Cold 05/12/2016   RESOLVING PER PT  . Depression    patient has not been officially diagnosed but has symptoms  . Diabetes mellitus without complication (HCC)   . Essential  hypertension, benign 03/31/2016  . Essential hypertension, benign 03/31/2016  . Irregular periods/menstrual cycles   . Joint pain of leg   . Major depression 11/23/2015   Past Surgical History:  Procedure Laterality Date  . CHOLECYSTECTOMY N/A 05/19/2016   Procedure: LAPAROSCOPIC CHOLECYSTECTOMY WITH INTRAOPERATIVE CHOLANGIOGRAM;  Surgeon: Nadeen Landau, MD;  Location: [facility] ORS;  Service: General;  Laterality: N/A;   Social History  Substance Use Topics  . Smoking status: Never Smoker  . Smokeless tobacco: Never Used  . Alcohol use 0.0 oz/week     Comment: OCC    Interim medical history since last visit reviewed. Allergies and medications reviewed  Review of Systems Per HPI unless specifically indicated above     Objective:    BP 120/78   Pulse 95   Temp 97.7 F (36.5 C) (Oral)   Resp 16   Wt 232 lb 5 oz (105.4 kg)   SpO2 97%   BMI 34.31 kg/m   Wt Readings from Last 3 Encounters:  09/01/16 232 lb 5 oz (105.4 kg)  08/17/16 232 lb 6.4 oz (105.4 kg)  08/11/16 234 lb (106.1 kg)    Physical Exam  Constitutional: She appears well-developed and well-nourished. No distress.  Eyes: EOM are normal. No scleral icterus.  Neck: No thyromegaly present.  Cardiovascular: Normal rate and regular rhythm.   Pulmonary/Chest: Effort normal and breath sounds normal.  Abdominal: She exhibits no distension.  Genitourinary: Uterus is not tender. Cervix exhibits no motion tenderness, no discharge and no friability. No erythema, tenderness or bleeding in the vagina. No signs of injury around the vagina. Vaginal discharge (moderate amount of whitish-off white discharge; no fishy odor) found.  Skin: No pallor.  Psychiatric: She has a normal mood and affect. Her behavior is normal. Judgment and thought content normal. Her mood appears not anxious. Her affect is not blunt. She does not exhibit a depressed mood.  Good eye contact with examiner   Diabetic Foot Form - Detailed   Diabetic  Foot Exam - detailed Diabetic Foot exam was performed with the following findings:  Yes 09/01/2016 10:22 AM  Visual Foot Exam completed.:  Yes  Are the toenails ingrown?:  No Normal Range of Motion:  Yes Pulse Foot Exam completed.:  Yes  Right Dorsalis Pedis:  Present Left Dorsalis Pedis:  Present  Sensory Foot Exam Completed.:  Yes Semmes-Weinstein Monofilament Test R Site 1-Great Toe:  Pos L Site 1-Great Toe:  Pos  R Site 4:  Pos L Site 4:  Pos  R Site 5:  Pos L Site 5:  Pos        Results for orders placed or performed in visit on 08/17/16  WET PREP BY MOLECULAR PROBE  Result Value Ref Range   Candida species NOT DETECTED NOT DETECTED   Trichomonas vaginosis NOT DETECTED NOT DETECTED   Gardnerella vaginalis DETECTED (A) NOT DETECTED      Assessment & Plan:   Problem List Items Addressed This Visit      Other   Major depression    Patient got through recent struggle without using medicine; continue to work with counselor       Other Visit Diagnoses    Acute vaginitis    -  Primary   Relevant Orders   GC/Chlamydia Probe Amp   WET PREP BY MOLECULAR PROBE   HIV antibody (with reflex)   Screen for STD (sexually transmitted disease)       Relevant Orders   GC/Chlamydia Probe Amp   WET PREP BY MOLECULAR PROBE   HIV antibody (with reflex)      Follow up plan: No Follow-up on file.  An after-visit summary was printed and given to the patient at check-out.  Please see the patient instructions which may contain other information and recommendations beyond what is mentioned above in the assessment and plan.  No orders of the defined types were placed in this encounter.   Orders Placed This Encounter  Procedures  . GC/Chlamydia Probe Amp  . WET PREP BY MOLECULAR PROBE  . HIV antibody (with reflex)

## 2016-09-01 NOTE — Assessment & Plan Note (Signed)
Patient got through recent struggle without using medicine; continue to work with counselor

## 2016-09-02 ENCOUNTER — Other Ambulatory Visit: Payer: Self-pay | Admitting: Family Medicine

## 2016-09-02 LAB — WET PREP BY MOLECULAR PROBE
Candida species: DETECTED — AB
GARDNERELLA VAGINALIS: NOT DETECTED
TRICHOMONAS VAG: NOT DETECTED

## 2016-09-02 LAB — HIV ANTIBODY (ROUTINE TESTING W REFLEX): HIV 1&2 Ab, 4th Generation: NONREACTIVE

## 2016-09-02 LAB — GC/CHLAMYDIA PROBE AMP
CT PROBE, AMP APTIMA: NOT DETECTED
GC PROBE AMP APTIMA: NOT DETECTED

## 2016-09-02 MED ORDER — FLUCONAZOLE 150 MG PO TABS: 150.0000 mg | ORAL_TABLET | Freq: Once | ORAL | 0 refills | Status: AC

## 2016-09-02 NOTE — Progress Notes (Signed)
Diflucan for yeast

## 2016-09-23 ENCOUNTER — Ambulatory Visit (INDEPENDENT_AMBULATORY_CARE_PROVIDER_SITE_OTHER): Payer: Medicaid Other | Admitting: Family Medicine

## 2016-09-23 ENCOUNTER — Encounter: Payer: Self-pay | Admitting: Family Medicine

## 2016-09-23 VITALS — BP 122/76 | HR 91 | Temp 97.8°F | Resp 16 | Wt 237.0 lb

## 2016-09-23 DIAGNOSIS — N926 Irregular menstruation, unspecified: Secondary | ICD-10-CM

## 2016-09-23 DIAGNOSIS — B372 Candidiasis of skin and nail: Secondary | ICD-10-CM | POA: Diagnosis not present

## 2016-09-23 DIAGNOSIS — J3089 Other allergic rhinitis: Secondary | ICD-10-CM

## 2016-09-23 DIAGNOSIS — J309 Allergic rhinitis, unspecified: Secondary | ICD-10-CM | POA: Insufficient documentation

## 2016-09-23 LAB — POCT URINE PREGNANCY: Preg Test, Ur: NEGATIVE

## 2016-09-23 MED ORDER — NYSTATIN 100000 UNIT/GM EX POWD: Freq: Three times a day (TID) | CUTANEOUS | 0 refills | Status: DC

## 2016-09-23 MED ORDER — LORATADINE 10 MG PO TABS: 10.0000 mg | ORAL_TABLET | Freq: Every day | ORAL | 11 refills | Status: DC

## 2016-09-23 NOTE — Progress Notes (Signed)
BP 122/76   Pulse 91   Temp 97.8 F (36.6 C) (Oral)   Resp 16   Wt 237 lb (107.5 kg)   SpO2 98%   BMI 35.00 kg/m    Subjective:    Patient ID: Penny Gonzalez, female    DOB: 1995-09-30, 21 y.o.   MRN: [id number]  HPI: MERRIANNE MCCUMBERS is a 21 y.o. female  Chief Complaint  Patient presents with  . Allergic Reaction    itching thorat  . 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; every time she is there, gets to itchy; showers after she gets home; she gets itchy; no hives  Dry patch under the breasts; tries to dry off there really well  Depression screen [facility] 2/9 09/23/2016 09/01/2016 08/11/2016 07/11/2016 05/16/2016  Decreased Interest 0 0 1 0 0  Down, Depressed, Hopeless 1 0 1 0 0  PHQ - 2 Score 1 0 2 0 0  Altered sleeping - - 3 - -  Tired, decreased energy - - 1 - -  Change in appetite - - 0 - -  Feeling bad or failure about yourself  - - 0 - -  Trouble concentrating - - 0 - -  Moving slowly or fidgety/restless - - 0 - -  Suicidal thoughts - - 0 - -  PHQ-9 Score - - 6 - -  Difficult doing work/chores Not difficult at all - Somewhat difficult - -    Relevant past medical, surgical, family and social history reviewed Past Medical History:  Diagnosis Date  . Anemia, iron deficiency, inadequate dietary intake 12/01/2014  . Bronchitis 04/2016   dr Jose Alleyne cleared pt medically for surgery after cxr results were normal (05-16-16)  . Cold 05/12/2016   RESOLVING PER PT  . Depression    patient has not been officially diagnosed but has symptoms  . Diabetes mellitus without complication (HCC)   . Essential hypertension, benign 03/31/2016  . Essential  hypertension, benign 03/31/2016  . Irregular periods/menstrual cycles   . Joint pain of leg   . Major depression 11/23/2015   Past Surgical History:  Procedure Laterality Date  . CHOLECYSTECTOMY N/A 05/19/2016   Procedure: LAPAROSCOPIC CHOLECYSTECTOMY WITH INTRAOPERATIVE CHOLANGIOGRAM;  Surgeon: Nadeen Landau, MD;  Location: [facility] ORS;  Service: General;  Laterality: N/A;   Social History  Substance Use Topics  . Smoking status: Never Smoker  . Smokeless tobacco: Never Used  . Alcohol use 0.0 oz/week     Comment: OCC   Interim medical history since last visit reviewed. Allergies and medications reviewed  Review of Systems Per HPI unless specifically indicated above     Objective:    BP 122/76   Pulse 91   Temp 97.8 F (36.6 C) (Oral)   Resp 16   Wt 237 lb (107.5 kg)   SpO2 98%   BMI 35.00 kg/m   Wt Readings from Last 3 Encounters:  09/23/16 237 lb (107.5 kg)  09/01/16 232 lb 5 oz (105.4 kg)  08/17/16 232 lb 6.4 oz (105.4 kg)    Physical Exam  Constitutional: She appears well-developed and well-nourished. No distress.  Cardiovascular: Normal rate.   Pulmonary/Chest: Effort normal.  Skin:  Very  mild patch of hypopigmentation, mild erythema under right breast in fold   Diabetic Foot Form - Detailed   Diabetic Foot Exam - detailed Diabetic Foot exam was performed with the following findings:  Yes 09/23/2016  9:05 AM  Visual Foot Exam completed.:  Yes  Is there a history of foot ulcer?:  No Are the toenails ingrown?:  No Normal Range of Motion:  Yes Pulse Foot Exam completed.:  Yes  Right Dorsalis Pedis:  Present Left Dorsalis Pedis:  Present  Sensory Foot Exam Completed.:  Yes Swelling:  No Semmes-Weinstein Monofilament Test R Site 1-Great Toe:  Pos L Site 1-Great Toe:  Pos  R Site 4:  Pos L Site 4:  Pos  R Site 5:  Pos L Site 5:  Pos         Results for orders placed or performed in visit on 09/23/16  POCT urine pregnancy  Result Value Ref Range    Preg Test, Ur Negative Negative      Assessment & Plan:   Problem List Items Addressed This Visit      Respiratory   Allergic rhinitis    Start anithistaimine, avoid triggers; showering after exposure       Other Visit Diagnoses    Menstrual period late    -  Primary   Relevant Orders   POCT urine pregnancy (Completed)   Yeast infection of the skin       Relevant Medications   nystatin (MYCOSTATIN/NYSTOP) powder       Follow up plan: Return in about 2 weeks (around 10/10/2016) for twenty minute follow-up with fasting labs; AFTER 4/29.  An after-visit summary was printed and given to the patient at check-out.  Please see the patient instructions which may contain other information and recommendations beyond what is mentioned above in the assessment and plan.  Meds ordered this encounter  Medications  . loratadine (CLARITIN) 10 MG tablet    Sig: Take 1 tablet (10 mg total) by mouth daily.    Dispense:  30 tablet    Refill:  11  . nystatin (MYCOSTATIN/NYSTOP) powder    Sig: Apply topically 3 (three) times daily.    Dispense:  15 g    Refill:  0    Orders Placed This Encounter  Procedures  . POCT urine pregnancy

## 2016-09-23 NOTE — Patient Instructions (Signed)
Try the new powder under the breasts Start the claritin for allergies and itchy throat Avoid triggers I'll see you soon

## 2016-09-23 NOTE — Assessment & Plan Note (Signed)
Start anithistaimine, avoid triggers; showering after exposure

## 2016-09-24 ENCOUNTER — Other Ambulatory Visit: Payer: Self-pay | Admitting: Family Medicine

## 2016-09-25 NOTE — Telephone Encounter (Signed)
I don't see a pap smear result on the chart; please track that down and own that until you get result on my desk please; if unable to get results, bring patient in for pap smear I'll send in refill

## 2016-09-26 NOTE — Telephone Encounter (Signed)
Pt notified. Pt will call back an schedule a pap smear

## 2016-09-28 ENCOUNTER — Telehealth: Payer: Self-pay

## 2016-09-28 NOTE — Telephone Encounter (Signed)
Patient's mental therapist called she states she takled with patient and told her to consider psych evual.  Patient declined.  Therapist states if you have any questions or concerns please feel free to call. [place] [phone]

## 2016-09-29 ENCOUNTER — Encounter: Payer: Self-pay | Admitting: Family Medicine

## 2016-09-29 MED ORDER — METFORMIN HCL ER 500 MG PO TB24: 1000.0000 mg | ORAL_TABLET | Freq: Every day | ORAL | 5 refills | Status: DC

## 2016-09-30 ENCOUNTER — Ambulatory Visit (INDEPENDENT_AMBULATORY_CARE_PROVIDER_SITE_OTHER): Payer: Medicaid Other | Admitting: Advanced Practice Midwife

## 2016-09-30 ENCOUNTER — Encounter: Payer: Self-pay | Admitting: Advanced Practice Midwife

## 2016-09-30 VITALS — BP 122/72 | HR 92 | Ht 69.0 in | Wt 238.0 lb

## 2016-09-30 DIAGNOSIS — B373 Candidiasis of vulva and vagina: Secondary | ICD-10-CM | POA: Diagnosis not present

## 2016-09-30 DIAGNOSIS — B9689 Other specified bacterial agents as the cause of diseases classified elsewhere: Secondary | ICD-10-CM | POA: Diagnosis not present

## 2016-09-30 DIAGNOSIS — B3731 Acute candidiasis of vulva and vagina: Secondary | ICD-10-CM

## 2016-09-30 DIAGNOSIS — N760 Acute vaginitis: Secondary | ICD-10-CM

## 2016-09-30 MED ORDER — METRONIDAZOLE 500 MG PO TABS: 500.0000 mg | ORAL_TABLET | Freq: Two times a day (BID) | ORAL | 0 refills | Status: AC

## 2016-09-30 MED ORDER — FLUCONAZOLE 150 MG PO TABS: 150.0000 mg | ORAL_TABLET | Freq: Once | ORAL | 3 refills | Status: AC

## 2016-09-30 NOTE — Progress Notes (Signed)
HPI:      [name] is a 21 y.o. G1P0101 who LMP was Patient's last menstrual period was 09/24/2016 (approximate)., presents today for a problem visit.  She complains of:  Vaginitis: Patient complains of an abnormal vaginal discharge for 2 days. Vaginal symptoms include discharge described as white, creamy and malodorous and odor.Vulvar symptoms include none.STI Risk: had screening with PCP recently. Other associated symptoms: noneMenstrual pattern: She had been bleeding regularly. Pt describes frequent episodes of BV especially associated with menstrual cycle. She used perfumed panty liner recently which may have aggravated her symptoms. Pt is wondering what she can do to reduce her risk of continued frequent BV. She requests medicine for BV today and also for yeast as she tends to have a yeast infection following the BV medicine.  PMHx: She  has a past medical history of Anemia, iron deficiency, inadequate dietary intake (12/01/2014); Bronchitis (04/2016); Cold (05/12/2016); Depression; Diabetes mellitus without complication (HCC); Essential hypertension, benign (03/31/2016); Essential hypertension, benign (03/31/2016); Irregular periods/menstrual cycles; Joint pain of leg; and Major depression (11/23/2015). Also,  has a past surgical history that includes Cholecystectomy (N/A, 05/19/2016)., family history includes Arthritis in her father; Deafness in her sister; Diabetes in her father, maternal grandmother, and mother; Heart disease in her father and paternal grandmother; Hypertension in her father, maternal grandmother, and paternal grandmother; Lupus in her sister; Stroke in her father.,  reports that she has never smoked. She has never used smokeless tobacco. She reports that she drinks alcohol. She reports that she does not use drugs.  She has a current medication list which includes the following prescription(s): loratadine, metformin, nystatin, xulane, fluconazole, and metronidazole.  Also, has No Known Allergies.  Review of Systems  Constitutional: Negative.   HENT: Negative.   Eyes: Negative.   Respiratory: Negative.   Cardiovascular: Negative.   Gastrointestinal: Negative.   Genitourinary: Negative.   Musculoskeletal: Negative.   Skin: Negative.   Neurological: Negative.   Endo/Heme/Allergies: Negative.   Psychiatric/Behavioral: Negative.     Objective: BP 122/72 (BP Location: Left Arm, Patient Position: Sitting, Cuff Size: Large)   Pulse 92   Ht  (1.753 m)   Wt 238 lb (108 kg)   LMP 09/24/2016 (Approximate)   BMI 35.15 kg/m    Physical Exam  Constitutional: She is oriented to person, place, and time. She appears well-developed and well-nourished.  Genitourinary: Vagina normal.  Genitourinary Comments: Minimal creamy white discharge noted. Ph 4.5, wet prep with a few clue cells, no evidence of yeast. Positive whiff test.  HENT:  Head: Normocephalic and atraumatic.  Neck: Normal range of motion.  Cardiovascular: Normal rate and regular rhythm.   Pulmonary/Chest: Effort normal and breath sounds normal.  Abdominal: Soft. Bowel sounds are normal.  Musculoskeletal: Normal range of motion.  Neurological: She is alert and oriented to person, place, and time.  Skin: Skin is warm and dry.  Psychiatric: She has a normal mood and affect.    ASSESSMENT/PLAN:    Problem List Items Addressed This Visit    None    Visit Diagnoses    Bacterial vaginosis    -  Primary   Relevant Medications   metroNIDAZOLE (FLAGYL) 500 MG tablet   fluconazole (DIFLUCAN) 150 MG tablet   Other Relevant Orders   NuSwab Vaginitis (VG)   Vaginal candidiasis       Relevant Medications   metroNIDAZOLE (FLAGYL) 500 MG tablet   fluconazole (DIFLUCAN) 150 MG tablet   Other Relevant Orders  NuSwab Vaginitis (VG)     Recommendations given to reduce future risk of BV: OTC Boric Acid treatment for Ph Probiotics to increase beneficial bacteria Soak in tub with 1 cup sea  salt as needed for cleansing/healing Avoid plastic covered sanitary pads and perfume products Do not douche  Follow up as needed   Tresea Mall, CNM

## 2016-10-04 ENCOUNTER — Encounter: Payer: Self-pay | Admitting: Family Medicine

## 2016-10-04 LAB — NUSWAB VAGINITIS (VG)
ATOPOBIUM VAGINAE: HIGH {score} — AB
BVAB 2: HIGH {score} — AB
CANDIDA ALBICANS, NAA: NEGATIVE
CANDIDA GLABRATA, NAA: NEGATIVE
Trich vag by NAA: NEGATIVE

## 2016-10-07 ENCOUNTER — Ambulatory Visit: Payer: Medicaid Other | Admitting: Family Medicine

## 2016-10-11 ENCOUNTER — Telehealth: Payer: Self-pay

## 2016-10-11 NOTE — Telephone Encounter (Addendum)
Pt calling stating pharm diesn't have rx for antibx for bv.  [employer],  Controls.  They stated they did received the medication rx and pt picked it up on the 13th and paid $3.  Called pt, line busy.  [phone]  Per JEG's note flagyl and diflucan was ordered but is not in med list.  Pt was seen on the 20th

## 2016-10-12 ENCOUNTER — Encounter: Payer: Self-pay | Admitting: Obstetrics and Gynecology

## 2016-10-12 ENCOUNTER — Ambulatory Visit (INDEPENDENT_AMBULATORY_CARE_PROVIDER_SITE_OTHER): Payer: Medicaid Other | Admitting: Obstetrics and Gynecology

## 2016-10-12 VITALS — BP 122/70 | Ht 69.0 in | Wt 236.0 lb

## 2016-10-12 DIAGNOSIS — N760 Acute vaginitis: Secondary | ICD-10-CM | POA: Diagnosis not present

## 2016-10-12 DIAGNOSIS — B9689 Other specified bacterial agents as the cause of diseases classified elsewhere: Secondary | ICD-10-CM | POA: Diagnosis not present

## 2016-10-12 LAB — POCT WET PREP WITH KOH
KOH PREP POC: NEGATIVE
PH, VAGINAL: 6
Trichomonas, UA: NEGATIVE

## 2016-10-12 MED ORDER — METRONIDAZOLE 500 MG PO TABS: 500.0000 mg | ORAL_TABLET | Freq: Two times a day (BID) | ORAL | 0 refills | Status: AC

## 2016-10-12 MED ORDER — BORIC ACID CRYS: 600.0000 mg | CRYSTALS | Freq: Every day | 0 refills | Status: AC

## 2016-10-12 MED ORDER — FLUCONAZOLE 150 MG PO TABS: 150.0000 mg | ORAL_TABLET | Freq: Once | ORAL | 1 refills | Status: AC

## 2016-10-12 MED ORDER — METRONIDAZOLE 0.75 % VA GEL: 1.0000 | VAGINAL | 2 refills | Status: DC

## 2016-10-12 NOTE — Addendum Note (Signed)
Addended by: Tresea Mall on: 10/12/2016 10:23 AM   Modules accepted: Orders

## 2016-10-12 NOTE — Progress Notes (Signed)
Obstetrics & Gynecology Office Visit   Chief Complaint  Patient presents with  . Vaginitis    pt states vaginal issues not getting better since last visit 4/20    History of Present Illness:  Vaginitis: Patient complains of an abnormal vaginal discharge for 2 weeks. Vaginal symptoms include burning, odor and abnormal odor and discharge (white, clear).Vulvar symptoms include local irritation and odor.STI Risk: Possible STD exposureDischarge described as: copious and clear.Other associated symptoms: none.Menstrual pattern: She had been bleeding regularly. Contraception: none  She has a history of recurrent BV that has been treated, but unsuccessfully.    Review of Systems: Review of Systems  Constitutional: Negative.   HENT: Negative.   Eyes: Negative.   Respiratory: Negative.   Cardiovascular: Negative.   Gastrointestinal: Negative.   Genitourinary: Negative.        Per HPI  Musculoskeletal: Negative.   Skin: Negative.   Neurological: Negative.   Psychiatric/Behavioral: Negative.     Past Medical History:  Diagnosis Date  . Anemia, iron deficiency, inadequate dietary intake 12/01/2014  . Bronchitis 04/2016   dr lada cleared pt medically for surgery after cxr results were normal (05-16-16)  . Cold 05/12/2016   RESOLVING PER PT  . Depression    patient has not been officially diagnosed but has symptoms  . Diabetes mellitus without complication (HCC)   . Essential hypertension, benign 03/31/2016  . Essential hypertension, benign 03/31/2016  . Irregular periods/menstrual cycles   . Joint pain of leg   . Major depression 11/23/2015    Past Surgical History:  Procedure Laterality Date  . CHOLECYSTECTOMY N/A 05/19/2016   Procedure: LAPAROSCOPIC CHOLECYSTECTOMY WITH INTRAOPERATIVE CHOLANGIOGRAM;  Surgeon: Nadeen Landau, MD;  Location: [facility] ORS;  Service: General;  Laterality: N/A;    Gynecologic History: Patient's last menstrual period was 09/24/2016  (approximate).  Obstetric History: G1P0101  Family History  Problem Relation Age of Onset  . Diabetes Mother   . Arthritis Father   . Diabetes Father   . Heart disease Father   . Hypertension Father   . Stroke Father   . Diabetes Maternal Grandmother   . Hypertension Maternal Grandmother   . Heart disease Paternal Grandmother   . Hypertension Paternal Grandmother   . Lupus Sister   . Deafness Sister     Social History   Social History  . Marital status: Single    Spouse name: N/A  . Number of children: 1  . Years of education: N/A   Occupational History  . Not on file.   Social History Main Topics  . Smoking status: Never Smoker  . Smokeless tobacco: Never Used  . Alcohol use 0.0 oz/week     Comment: OCC  . Drug use: No  . Sexual activity: Yes    Partners: Male    Birth control/ protection: Condom, Patch   Other Topics Concern  . Not on file   Social History Narrative  . No narrative on file    Allergies: No Known Allergies  Medications:   Medication Sig Start Date End Date Taking? Authorizing Provider  fluconazole (DIFLUCAN) 150 MG tablet Take 1 tablet (150 mg total) by mouth once. Can take additional dose three days later if symptoms persist 10/12/16 10/12/16  Tresea Mall, CNM  loratadine (CLARITIN) 10 MG tablet Take 1 tablet (10 mg total) by mouth daily. 09/23/16   [name], MD  metFORMIN (GLUCOPHAGE-XR) 500 MG 24 hr tablet Take 2 tablets (1,000 mg total) by mouth daily. 09/29/16  [name], MD  metroNIDAZOLE (FLAGYL) 500 MG tablet Take 1 tablet (500 mg total) by mouth 2 (two) times daily. 10/12/16 10/19/16  Tresea Mall, CNM  nystatin (MYCOSTATIN/NYSTOP) powder Apply topically 3 (three) times daily. 09/23/16   [name], MD  [employer] 150-35 MCG/24HR transdermal patch APPLY 1 PATCH TOPICALLY ONCE A WEEK 09/25/16   [name], MD    Physical Exam BP 122/70   Ht  (1.753 m)   Wt 236 lb (107 kg)   LMP 09/24/2016 (Approximate)   BMI 34.85  kg/m  Patient's last menstrual period was 09/24/2016 (approximate). Physical Exam  Constitutional: She is oriented to person, place, and time and well-developed, well-nourished, and in no distress. No distress.  HENT:  Head: Normocephalic and atraumatic.  Eyes: Conjunctivae are normal. Left eye exhibits no discharge. No scleral icterus.  Neck: Normal range of motion. Neck supple.  Cardiovascular: Normal rate and regular rhythm.  Exam reveals no gallop and no friction rub.   No murmur heard. Pulmonary/Chest: Effort normal and breath sounds normal. No respiratory distress. She has no wheezes. She has no rales.  Abdominal: Soft. Bowel sounds are normal. She exhibits no distension and no mass. There is no tenderness. There is no rebound and no guarding.  Genitourinary: Vagina normal, uterus normal, cervix normal, right adnexa normal, left adnexa normal and vulva normal.  Musculoskeletal: Normal range of motion. She exhibits no edema.  Lymphadenopathy:    She has no cervical adenopathy.  Neurological: She is alert and oriented to person, place, and time. No cranial nerve deficit.  Skin: Skin is warm and dry. No rash noted.  Psychiatric: Judgment normal.    Female chaperone present for pelvic and breast  portions of the physical exam  Wet Prep: Vaginal pH: 6.0 Clue Cells: present Trichomonas: absent Fungal elements: absent  Assessment: 21 y.o. G1P0101 with recurrent BV.  Plan: Problem List Items Addressed This Visit    None    Visit Diagnoses    BV (bacterial vaginosis)  (Chronic)   -  Primary   Relevant Medications   metroNIDAZOLE (METROGEL) 0.75 % vaginal gel (Start on 10/13/2016)   Boric Acid CRYS   Other Relevant Orders   Chlamydia/Gonococcus/Trichomonas, NAA   POCT Wet Prep with KOH     Therapy for recurrent BV 1) initiate with flagyl  po bid x 7 days along with boric acid  pv qhs x 21 days.  2) follow with suppression of metronidazole gel 0.75% twice weekly for  4-6 months. 3) follow up 6 months to see response.   Thomasene Mohair, MD 10/12/2016 1:41 PM

## 2016-10-12 NOTE — Telephone Encounter (Signed)
Pt is calling about her prescription and has been to the pharmcy. Pt would like to be contacted when her medication is sent in please

## 2016-10-12 NOTE — Telephone Encounter (Signed)
Rx's sent to pharmacy and pt notified.

## 2016-10-13 ENCOUNTER — Telehealth: Payer: Self-pay

## 2016-10-13 LAB — HM DIABETES EYE EXAM

## 2016-10-13 NOTE — Telephone Encounter (Signed)
Penny KernAdv [name] to dispense 21 600mg  vag supp.

## 2016-10-13 NOTE — Telephone Encounter (Signed)
[name] calling from [facility]  They need quantity of Boric Acid and for how many days.  [phone]

## 2016-10-13 NOTE — Telephone Encounter (Signed)
21 days. So 21 600mg  boric acid vaginal suppositories. Please let pharm know as I have been on hold for 5 mins and need to get [facility] back. Please and thank you!

## 2016-10-14 ENCOUNTER — Ambulatory Visit (INDEPENDENT_AMBULATORY_CARE_PROVIDER_SITE_OTHER): Payer: Medicaid Other | Admitting: Family Medicine

## 2016-10-14 ENCOUNTER — Encounter: Payer: Self-pay | Admitting: Family Medicine

## 2016-10-14 DIAGNOSIS — E1165 Type 2 diabetes mellitus with hyperglycemia: Secondary | ICD-10-CM

## 2016-10-14 DIAGNOSIS — N760 Acute vaginitis: Secondary | ICD-10-CM

## 2016-10-14 DIAGNOSIS — E559 Vitamin D deficiency, unspecified: Secondary | ICD-10-CM

## 2016-10-14 DIAGNOSIS — R808 Other proteinuria: Secondary | ICD-10-CM

## 2016-10-14 DIAGNOSIS — R5383 Other fatigue: Secondary | ICD-10-CM

## 2016-10-14 DIAGNOSIS — I10 Essential (primary) hypertension: Secondary | ICD-10-CM | POA: Diagnosis not present

## 2016-10-14 DIAGNOSIS — K760 Fatty (change of) liver, not elsewhere classified: Secondary | ICD-10-CM

## 2016-10-14 DIAGNOSIS — B9689 Other specified bacterial agents as the cause of diseases classified elsewhere: Secondary | ICD-10-CM

## 2016-10-14 LAB — TSH: TSH: 0.62 m[IU]/L

## 2016-10-14 MED ORDER — METFORMIN HCL ER 500 MG PO TB24: 1000.0000 mg | ORAL_TABLET | Freq: Every day | ORAL | 5 refills | Status: DC

## 2016-10-14 MED ORDER — METFORMIN HCL ER 500 MG PO TB24: 500.0000 mg | ORAL_TABLET | Freq: Every day | ORAL | 5 refills | Status: DC

## 2016-10-14 NOTE — Assessment & Plan Note (Signed)
Controlling diabetes; unable to get urine today, will get next visit

## 2016-10-14 NOTE — Assessment & Plan Note (Addendum)
Check labs; last two previous levels have been under 20 which can contribute to fatigue

## 2016-10-14 NOTE — Assessment & Plan Note (Signed)
Check vit D level and TSH

## 2016-10-14 NOTE — Assessment & Plan Note (Addendum)
Check A1c today; will plan to get urine microalbumin next time, patient just voided; foot exam by MD today

## 2016-10-14 NOTE — Patient Instructions (Signed)
Check out the information at familydoctor.org entitled "Nutrition for Weight Loss: What You Need to Know about Fad Diets" Try to lose between 1-2 pounds per week by taking in fewer calories and burning off more calories You can succeed by limiting portions, limiting foods dense in calories and fat, becoming more active, and drinking 8 glasses of water a day (64 ounces) Don't skip meals, especially breakfast, as skipping meals may alter your metabolism Do not use over-the-counter weight loss pills or gimmicks that claim rapid weight loss A healthy BMI (or body mass index) is between 18.5 and 24.9 You can calculate your ideal BMI at the NIH website http://www.nhlbi.nih.gov/health/educational/lose_wt/BMI/bmicalc.htm  Please do see your eye doctor regularly, and have your eyes examined every year (or more often per his or her recommendation) Check your feet every night and let me know right away of any sores, infections, numbness, etc. Try to limit sweets, white bread, white rice, white potatoes It is okay with me for you to not check your fingerstick blood sugars (per American College of Endocrinology Best Practices), unless you are interested and feel it would be helpful for you   

## 2016-10-14 NOTE — Progress Notes (Signed)
BP 126/70   Pulse 95   Temp 98.3 F (36.8 C) (Oral)   Resp 14   Wt 239 lb 1.6 oz (108.5 kg)   LMP 09/24/2016 (Approximate)   SpO2 99%   BMI 35.31 kg/m    Subjective:    Patient ID: Penny Gonzalez, female    DOB: 1995/10/01, 21 y.o.   MRN: [id number]  HPI: Penny Gonzalez is a 21 y.o. female  Chief Complaint  Patient presents with  . Follow-up   HPI Type 2 diabetes Not checking FSBS with my blessing Saw eye doctor yesterday; no problems with eyes No problems with feet Drinking more water; cut out sodas, occasional ginger ale; not drinking sports drinks  Not much of a bread eater; occasional sandwich with white bread Lab Results  Component Value Date   HGBA1C 7.0 (H) 07/11/2016  she is hoping for a 6.7 or lower today She has been doing so good she says She takes just one metformin a day Her body does not like two metformin a day; thinks her sugars go down too low  Beautiful cholesterol panel in January  Vitamin D deficiency; always tired; took the supplements  Depression screen [facility] 2/9 10/14/2016 10/14/2016 09/23/2016 09/01/2016 08/11/2016  Decreased Interest 0 0 0 0 1  Down, Depressed, Hopeless 1 - 1 0 1  PHQ - 2 Score 1 0 1 0 2  Altered sleeping - - - - 3  Tired, decreased energy - - - - 1  Change in appetite - - - - 0  Feeling bad or failure about yourself  - - - - 0  Trouble concentrating - - - - 0  Moving slowly or fidgety/restless - - - - 0  Suicidal thoughts - - - - 0  PHQ-9 Score - - - - 6  Difficult doing work/chores - - Not difficult at all - Somewhat difficult   Relevant past medical, surgical, family and social history reviewed Past Medical History:  Diagnosis Date  . Anemia, iron deficiency, inadequate dietary intake 12/01/2014  . Bronchitis 04/2016   dr lada cleared pt medically for surgery after cxr results were normal (05-16-16)  . Cold 05/12/2016   RESOLVING PER PT  . Depression    patient has not been officially diagnosed but has  symptoms  . Diabetes mellitus without complication (HCC)   . Essential hypertension, benign 03/31/2016  . Essential hypertension, benign 03/31/2016  . Irregular periods/menstrual cycles   . Joint pain of leg   . Major depression 11/23/2015   Past Surgical History:  Procedure Laterality Date  . CHOLECYSTECTOMY N/A 05/19/2016   Procedure: LAPAROSCOPIC CHOLECYSTECTOMY WITH INTRAOPERATIVE CHOLANGIOGRAM;  Surgeon: Nadeen Landau, MD;  Location: [facility] ORS;  Service: General;  Laterality: N/A;   family history includes Arthritis in her father; Deafness in her sister; Diabetes in her father, maternal grandmother, and mother; Heart disease in her father and paternal grandmother; Hypertension in her father, maternal grandmother, and paternal grandmother; Lupus in her sister; Stroke in her father. Social History   Social History  . Marital status: Single    Spouse name: N/A  . Number of children: 1  . Years of education: N/A   Occupational History  . Not on file.   Social History Main Topics  . Smoking status: Never Smoker  . Smokeless tobacco: Never Used  . Alcohol use 0.0 oz/week     Comment: OCC  . Drug use: No  . Sexual activity: Yes    Partners:  Male    Birth control/ protection: Condom, Patch   Other Topics Concern  . Not on file   Social History Narrative  . No narrative on file    Interim medical history since last visit reviewed. Allergies and medications reviewed  Review of Systems Per HPI unless specifically indicated above     Objective:    BP 126/70   Pulse 95   Temp 98.3 F (36.8 C) (Oral)   Resp 14   Wt 239 lb 1.6 oz (108.5 kg)   LMP 09/24/2016 (Approximate)   SpO2 99%   BMI 35.31 kg/m   Wt Readings from Last 3 Encounters:  10/14/16 239 lb 1.6 oz (108.5 kg)  10/12/16 236 lb (107 kg)  09/30/16 238 lb (108 kg)    Physical Exam  Constitutional: She appears well-developed and well-nourished. No distress.  HENT:  Head: Normocephalic and  atraumatic.  Eyes: EOM are normal. No scleral icterus.  Neck: No thyromegaly present.  Cardiovascular: Normal rate, regular rhythm and normal heart sounds.   No murmur heard. Pulmonary/Chest: Effort normal and breath sounds normal. No respiratory distress. She has no wheezes.  Abdominal: Soft. Bowel sounds are normal. She exhibits no distension.  Musculoskeletal: Normal range of motion. She exhibits no edema.  Neurological: She is alert. She exhibits normal muscle tone.  Skin: Skin is warm and dry. She is not diaphoretic. No pallor.  Psychiatric: She has a normal mood and affect. Her behavior is normal. Judgment and thought content normal.   Diabetic Foot Form - Detailed   Diabetic Foot Exam - detailed Diabetic Foot exam was performed with the following findings:  Yes 10/14/2016 10:46 AM  Visual Foot Exam completed.:  Yes  Are the toenails ingrown?:  No Normal Range of Motion:  Yes Pulse Foot Exam completed.:  Yes  Right Dorsalis Pedis:  Present Left Dorsalis Pedis:  Present  Sensory Foot Exam Completed.:  Yes Swelling:  No Semmes-Weinstein Monofilament Test R Site 1-Great Toe:  Pos L Site 1-Great Toe:  Pos  R Site 4:  Pos L Site 4:  Pos  R Site 5:  Pos L Site 5:  Pos        Results for orders placed or performed in visit on 10/12/16  POCT Wet Prep with KOH  Result Value Ref Range   Trichomonas, UA Negative    Clue Cells Wet Prep HPF POC Present    Epithelial Wet Prep HPF POC  Few, Moderate, Many, Too numerous to count   Yeast Wet Prep HPF POC     Bacteria Wet Prep HPF POC  Few   RBC Wet Prep HPF POC     WBC Wet Prep HPF POC     KOH Prep POC Negative Negative   PH, VAGINAL 6.0       Assessment & Plan:   Problem List Items Addressed This Visit      Cardiovascular and Mediastinum   Essential hypertension, benign (Chronic)    Well-controlled today        Digestive   Fatty liver (Chronic)    Encouraged weight loss        Endocrine   Diabetes mellitus type 2,  controlled (HCC) (Chronic)    Check A1c today; will plan to get urine microalbumin next time, patient just voided; foot exam by MD today      Relevant Medications   metFORMIN (GLUCOPHAGE-XR) 500 MG 24 hr tablet   Other Relevant Orders   Hemoglobin A1c  Genitourinary   Bacterial vaginosis    Recurrent; now on regimen through GYN office, BID metronidazole, then will do metrogel twice a week; she has not picked up the boric acid yet, and will contact the GYN office for that        Other   Vitamin D deficiency    Check labs; last two previous levels have been under 20 which can contribute to fatigue      Relevant Orders   VITAMIN D 25 Hydroxy (Vit-D Deficiency, Fractures)   Proteinuria    Controlling diabetes; unable to get urine today, will get next visit      Morbid obesity (HCC)    BMI > 35 plus diabetes mellitus; encouraged weight loss      Relevant Medications   metFORMIN (GLUCOPHAGE-XR) 500 MG 24 hr tablet   Fatigue    Check vit D level and TSH      Relevant Orders   TSH       Follow up plan: Return in about 3 months (around 01/14/2017) for twenty minute follow-up with fasting labs.  An after-visit summary was printed and given to the patient at check-out.  Please see the patient instructions which may contain other information and recommendations beyond what is mentioned above in the assessment and plan.  Meds ordered this encounter  Medications  . DISCONTD: metFORMIN (GLUCOPHAGE-XR) 500 MG 24 hr tablet    Sig: Take 2 tablets (1,000 mg total) by mouth daily with breakfast.    Dispense:  30 tablet    Refill:  5    New instructions  . metFORMIN (GLUCOPHAGE-XR) 500 MG 24 hr tablet    Sig: Take 1 tablet (500 mg total) by mouth daily.    Dispense:  30 tablet    Refill:  5    Please use this one instead -- thank you! New instructions    Orders Placed This Encounter  Procedures  . TSH  . VITAMIN D 25 Hydroxy (Vit-D Deficiency, Fractures)  . Hemoglobin A1c

## 2016-10-14 NOTE — Assessment & Plan Note (Signed)
BMI > 35 plus diabetes mellitus; encouraged weight loss

## 2016-10-14 NOTE — Assessment & Plan Note (Signed)
Well controlled today.

## 2016-10-14 NOTE — Assessment & Plan Note (Signed)
Recurrent; now on regimen through GYN office, BID metronidazole, then will do metrogel twice a week; she has not picked up the boric acid yet, and will contact the GYN office for that

## 2016-10-14 NOTE — Assessment & Plan Note (Signed)
Encouraged weight loss 

## 2016-10-15 ENCOUNTER — Other Ambulatory Visit: Payer: Self-pay | Admitting: Family Medicine

## 2016-10-15 LAB — HEMOGLOBIN A1C
HEMOGLOBIN A1C: 6.4 % — AB (ref <5.7)
Mean Plasma Glucose: 137 mg/dL

## 2016-10-15 LAB — CHLAMYDIA/GONOCOCCUS/TRICHOMONAS, NAA
Chlamydia by NAA: NEGATIVE
Gonococcus by NAA: NEGATIVE
Trich vag by NAA: NEGATIVE

## 2016-10-15 LAB — VITAMIN D 25 HYDROXY (VIT D DEFICIENCY, FRACTURES): VIT D 25 HYDROXY: 22 ng/mL — AB (ref 30–100)

## 2016-10-15 MED ORDER — VITAMIN D (ERGOCALCIFEROL) 1.25 MG (50000 UNIT) PO CAPS: 50000.0000 [IU] | ORAL_CAPSULE | ORAL | 2 refills | Status: DC

## 2016-10-15 NOTE — Progress Notes (Signed)
Rx vitamin D for 3 months, then 1,000 vit D3 daily OTC

## 2016-10-26 ENCOUNTER — Telehealth: Payer: Self-pay | Admitting: Family Medicine

## 2016-10-26 NOTE — Telephone Encounter (Signed)
Pt would like a call back please. °

## 2016-10-26 NOTE — Telephone Encounter (Signed)
Pt states she would like to schedule a CPE. Dr lada mention to her that she need a pap smear and CPE. Pt has not had one. Pt states she will schedule an appt and she would like to get a blood test to see if she is pregnant as well. I state to her we should schedule an appt ASAP if she thinks she pregnant. Pt agrees. Rhonda schedule her a appt.

## 2016-10-27 ENCOUNTER — Ambulatory Visit (INDEPENDENT_AMBULATORY_CARE_PROVIDER_SITE_OTHER): Payer: Medicaid Other | Admitting: Obstetrics and Gynecology

## 2016-10-27 ENCOUNTER — Encounter: Payer: Self-pay | Admitting: Obstetrics and Gynecology

## 2016-10-27 VITALS — BP 120/80 | HR 73 | Ht 69.0 in | Wt 235.0 lb

## 2016-10-27 DIAGNOSIS — Z1239 Encounter for other screening for malignant neoplasm of breast: Secondary | ICD-10-CM

## 2016-10-27 DIAGNOSIS — N926 Irregular menstruation, unspecified: Secondary | ICD-10-CM

## 2016-10-27 DIAGNOSIS — Z113 Encounter for screening for infections with a predominantly sexual mode of transmission: Secondary | ICD-10-CM

## 2016-10-27 DIAGNOSIS — N898 Other specified noninflammatory disorders of vagina: Secondary | ICD-10-CM | POA: Diagnosis not present

## 2016-10-27 DIAGNOSIS — Z1231 Encounter for screening mammogram for malignant neoplasm of breast: Secondary | ICD-10-CM | POA: Diagnosis not present

## 2016-10-27 LAB — POCT WET PREP WITH KOH
CLUE CELLS WET PREP PER HPF POC: NEGATIVE
KOH Prep POC: NEGATIVE
TRICHOMONAS UA: NEGATIVE
Yeast Wet Prep HPF POC: NEGATIVE

## 2016-10-27 LAB — POCT URINE PREGNANCY: PREG TEST UR: NEGATIVE

## 2016-10-27 NOTE — Progress Notes (Signed)
Chief Complaint  Patient presents with  . Vaginitis    wants blood hcg test    HPI:      [name] is a 21 y.o. G1P0101 who LMP was Patient's last menstrual period was 10/21/2016., presents today for increased vag d/c with a tinge of irritation, no odor. Sx started yesterday. She has a hx of recurrent BV. Pt is on metrogel twice weekly for preventive. She also had unprotected sex a few wks ago. She would like STD testing. She denies any known exposures.  Her LMP was lighter than usual and due to unprotected sex, she would like blood preg test. She had a neg UPT with her PCP 10/14/16, but LMP was 10/21/16.  She also noticed a RT breast mass a wk ago on SBE. It was around her LMP. She has not felt the lump since. No tenderness.    Patient Active Problem List   Diagnosis Date Noted  . Bacterial vaginosis 10/14/2016  . Allergic rhinitis 09/23/2016  . Fever blister 08/21/2016  . Abdominal pain, RLQ 07/11/2016  . Fatty liver 05/07/2016  . Foot pain, bilateral 03/31/2016  . Essential hypertension, benign 03/31/2016  . Proteinuria 01/30/2016  . Major depression 11/23/2015  . Vitamin D deficiency 11/19/2015  . Fatigue 11/18/2015  . Preventative health care 10/27/2015  . Medication monitoring encounter 10/27/2015  . Constipation, slow transit 02/18/2015  . Tonsil stone 02/18/2015  . Diabetes mellitus type 2, controlled (HCC) 11/26/2014  . Headache disorder 11/26/2014  . Arthralgia of multiple joints 11/26/2014  . Morbid obesity (HCC) 11/26/2014  . Contact with and suspected exposure to infections with predominantly sexual mode of transmission 11/26/2014    Family History  Problem Relation Age of Onset  . Diabetes Mother   . Arthritis Father   . Diabetes Father   . Heart disease Father   . Hypertension Father   . Stroke Father   . Diabetes Maternal Grandmother   . Hypertension Maternal Grandmother   . Heart disease Paternal Grandmother   . Hypertension Paternal  Grandmother   . Lupus Sister   . Deafness Sister     Social History   Social History  . Marital status: Single    Spouse name: N/A  . Number of children: 1  . Years of education: N/A   Occupational History  . Not on file.   Social History Main Topics  . Smoking status: Never Smoker  . Smokeless tobacco: Never Used  . Alcohol use 0.0 oz/week     Comment: OCC  . Drug use: No  . Sexual activity: Yes    Partners: Male    Birth control/ protection: Condom, Patch   Other Topics Concern  . Not on file   Social History Narrative  . No narrative on file     Current Outpatient Prescriptions:  .  metFORMIN (GLUCOPHAGE-XR) 500 MG 24 hr tablet, Take 1 tablet (500 mg total) by mouth daily., Disp: 30 tablet, Rfl: 5 .  metroNIDAZOLE (METROGEL) 0.75 % vaginal gel, Place 1 Applicatorful vaginally 2 (two) times a week., Disp: 25 g, Rfl: 2 .  nystatin (MYCOSTATIN/NYSTOP) powder, Apply topically 3 (three) times daily., Disp: 15 g, Rfl: 0 .  XULANE 150-35 MCG/24HR transdermal patch, APPLY 1 PATCH TOPICALLY ONCE A WEEK, Disp: 3 patch, Rfl: 1 .  Boric Acid CRYS, Place 600 mg vaginally at bedtime. (Patient not taking: Reported on 10/27/2016), Disp: 12600 g, Rfl: 0 .  loratadine (CLARITIN) 10 MG tablet, Take 1 tablet (  10 mg total) by mouth daily. (Patient not taking: Reported on 10/27/2016), Disp: 30 tablet, Rfl: 11 .  Vitamin D, Ergocalciferol, (DRISDOL) 50000 units CAPS capsule, Take 1 capsule (50,000 Units total) by mouth every 30 (thirty) days. (Patient not taking: Reported on 10/27/2016), Disp: 1 capsule, Rfl: 2  Review of Systems  Constitutional: Positive for fatigue. Negative for fever.  Gastrointestinal: Negative for blood in stool, constipation, diarrhea, nausea and vomiting.  Genitourinary: Positive for menstrual problem and vaginal discharge. Negative for dyspareunia, dysuria, flank pain, frequency, hematuria, urgency, vaginal bleeding and vaginal pain.       Breast: lump    Musculoskeletal: Negative for back pain.  Skin: Negative for rash.     OBJECTIVE:   Vitals:  BP 120/80   Pulse 73   Ht 5\' 9"  (1.753 m)   Wt 235 lb (106.6 kg)   LMP 10/21/2016   BMI 34.70 kg/m   Physical Exam  Constitutional: She is oriented to person, place, and time and well-developed, well-nourished, and in no distress. Vital signs are normal.  Pulmonary/Chest: Right breast exhibits no inverted nipple, no mass, no nipple discharge, no skin change and no tenderness. Left breast exhibits no inverted nipple, no mass, no nipple discharge, no skin change and no tenderness.    RT BREAST WITHOUT MASSES; NORMAL BREAST TISSUE BILAT  Genitourinary: Vagina normal, uterus normal, cervix normal, right adnexa normal, left adnexa normal and vulva normal. Uterus is not enlarged. Cervix exhibits no motion tenderness and no tenderness. Right adnexum displays no mass and no tenderness. Left adnexum displays no mass and no tenderness. Vulva exhibits no erythema, no exudate, no lesion, no rash and no tenderness. Vagina exhibits no lesion.  Neurological: She is oriented to person, place, and time.  Vitals reviewed.   Results: Results for orders placed or performed in visit on 10/27/16 (from the past 24 hour(s))  POCT Wet Prep with KOH     Status: Normal   Collection Time: 10/27/16  2:28 PM  Result Value Ref Range   Trichomonas, UA Negative    Clue Cells Wet Prep HPF POC neg    Epithelial Wet Prep HPF POC  Few, Moderate, Many, Too numerous to count   Yeast Wet Prep HPF POC neg    Bacteria Wet Prep HPF POC  Few   RBC Wet Prep HPF POC     WBC Wet Prep HPF POC     KOH Prep POC Negative Negative  POCT urine pregnancy     Status: Normal   Collection Time: 10/27/16  2:37 PM  Result Value Ref Range   Preg Test, Ur Negative Negative     Assessment/Plan: Vaginal discharge - Neg wet prep/exam. Reassurance. F/u prn. Cont metrogel 2x weekly. - Plan: POCT Wet Prep with KOH  Screening for STD  (sexually transmitted disease) - Pt request. Will f/u with results. - Plan: Chlamydia/Gonococcus/Trichomonas, NAA  Irregular menses - Neg UPT. Would be positive by now if pregnant. Reassurance.  - Plan: POCT urine pregnancy  Screening breast examination - No masses. Reassurance. F/u prn.     Return if symptoms worsen or fail to improve.  Jennfer Gassen B. Linda Grimmer, PA-C 10/27/2016 2:45 PM

## 2016-10-30 LAB — CHLAMYDIA/GONOCOCCUS/TRICHOMONAS, NAA
Chlamydia by NAA: NEGATIVE
Gonococcus by NAA: NEGATIVE
Trich vag by NAA: NEGATIVE

## 2016-10-31 ENCOUNTER — Telehealth: Payer: Self-pay

## 2016-10-31 NOTE — Telephone Encounter (Signed)
Pt calling for results from last week.  Adv neg.  Would you release them to MyChart?  Thanks.

## 2016-10-31 NOTE — Telephone Encounter (Signed)
Done

## 2016-11-03 ENCOUNTER — Ambulatory Visit (INDEPENDENT_AMBULATORY_CARE_PROVIDER_SITE_OTHER): Payer: Medicaid Other | Admitting: Family Medicine

## 2016-11-03 ENCOUNTER — Encounter: Payer: Self-pay | Admitting: Family Medicine

## 2016-11-03 VITALS — BP 128/86 | HR 97 | Temp 98.4°F | Resp 16 | Wt 235.0 lb

## 2016-11-03 DIAGNOSIS — E1165 Type 2 diabetes mellitus with hyperglycemia: Secondary | ICD-10-CM | POA: Diagnosis not present

## 2016-11-03 DIAGNOSIS — Z113 Encounter for screening for infections with a predominantly sexual mode of transmission: Secondary | ICD-10-CM

## 2016-11-03 DIAGNOSIS — L299 Pruritus, unspecified: Secondary | ICD-10-CM | POA: Diagnosis not present

## 2016-11-03 LAB — HEPATITIS PANEL, ACUTE
HCV Ab: NEGATIVE
Hep A IgM: NONREACTIVE
Hep B C IgM: NONREACTIVE
Hepatitis B Surface Ag: NEGATIVE

## 2016-11-03 LAB — HIV ANTIBODY (ROUTINE TESTING W REFLEX): HIV 1&2 Ab, 4th Generation: NONREACTIVE

## 2016-11-03 MED ORDER — VITAMIN D (ERGOCALCIFEROL) 1.25 MG (50000 UNIT) PO CAPS: 50000.0000 [IU] | ORAL_CAPSULE | ORAL | 2 refills | Status: DC

## 2016-11-03 NOTE — Progress Notes (Signed)
BP 128/86   Pulse 97   Temp 98.4 F (36.9 C) (Oral)   Resp 16   Wt 235 lb (106.6 kg)   LMP 10/21/2016   SpO2 98%   BMI 34.70 kg/m    Subjective:    Patient ID: Penny Gonzalez, female    DOB: 12/07/1995, 21 y.o.   MRN: [id number]  HPI: Penny Gonzalez is a 21 y.o. female  Chief Complaint  Patient presents with  . Exposure to STD    no symptoms, wants tested via blood work. Pt was tested via urine at [facility]; everything was neg.  [name]. Pruritis    skin all over with bumps   HPI She wants to be tested for STDs; has not been with the father of her baby in August A few months ago, he found out someone he had been with has AIDS; he is getting checked This is really a mind thing; she is pretty sure she doesn't have anything Was with someone last week; condom popped the last time; she says she trusts him Wants blood work done Wants to be safe She [facility]  She has been having pruritis and hives; sometimes itches on upper thighs, in between thighs; itches; cortisone  Needs to get vit D and will need that sent to [facility] [address]  [facility] 10/14/2016 10/14/2016 09/23/2016 09/01/2016 08/11/2016  Decreased Interest 0 0 0 0 1  Down, Depressed, Hopeless 1 - 1 0 1  PHQ - 2 Score 1 0 1 0 2  Altered sleeping - - - - 3  Tired, decreased energy - - - - 1  Change in appetite - - - - 0  Feeling bad or failure about yourself  - - - - 0  Trouble concentrating - - - - 0  Moving slowly or fidgety/restless - - - - 0  Suicidal thoughts - - - - 0  PHQ-9 Score - - - - 6  Difficult doing work/chores - - Not difficult at all - Somewhat difficult    Relevant past medical, surgical, family and social history reviewed Past Medical History:  Diagnosis Date  . Anemia, iron deficiency, inadequate dietary intake 12/01/2014  . Bronchitis 04/2016   dr lada cleared pt medically for surgery after cxr results were normal (05-16-16)  . Cold 05/12/2016    RESOLVING PER PT  . Depression    patient has not been officially diagnosed but has symptoms  . Diabetes mellitus without complication (HCC)   . Essential hypertension, benign 03/31/2016  . Essential hypertension, benign 03/31/2016  . Irregular periods/menstrual cycles   . Joint pain of leg   . Major depression 11/23/2015   Family History  Problem Relation Age of Onset  . Diabetes Mother   . Arthritis Father   . Diabetes Father   . Heart disease Father   . Hypertension Father   . Stroke Father   . Diabetes Maternal Grandmother   . Hypertension Maternal Grandmother   . Heart disease Paternal Grandmother   . Hypertension Paternal Grandmother   . Lupus Sister   . Deafness Sister    Social History   Social History  . Marital status: Single    Spouse name: N/A  . Number of children: 1  . Years of education: N/A   Occupational History  . Not on file.   Social History Main Topics  . Smoking status: Never Smoker  . Smokeless tobacco: Never  Used  . Alcohol use 0.0 oz/week     Comment: OCC  . Drug use: No  . Sexual activity: Yes    Partners: Male    Birth control/ protection: Condom, Patch   Other Topics Concern  . Not on file   Social History Narrative  . No narrative on file   Interim medical history since last visit reviewed. Allergies and medications reviewed  Review of Systems Per HPI unless specifically indicated above     Objective:    BP 128/86   Pulse 97   Temp 98.4 F (36.9 C) (Oral)   Resp 16   Wt 235 lb (106.6 kg)   LMP 10/21/2016   SpO2 98%   BMI 34.70 kg/m   Wt Readings from Last 3 Encounters:  11/03/16 235 lb (106.6 kg)  10/27/16 235 lb (106.6 kg)  10/14/16 239 lb 1.6 oz (108.5 kg)    Physical Exam  Constitutional: She appears well-developed and well-nourished. No distress.  Cardiovascular: Normal rate and regular rhythm.   Pulmonary/Chest: Effort normal and breath sounds normal.  Skin:  No hives or erythema noted over the upper  posterior thighs (where patient noted previous hives)  Psychiatric: She has a normal mood and affect.   Diabetic Foot Form - Detailed   Diabetic Foot Exam - detailed Diabetic Foot exam was performed with the following findings:  Yes 11/03/2016 10:53 AM  Visual Foot Exam completed.:  Yes  Are the toenails ingrown?:  No Normal Range of Motion:  Yes Pulse Foot Exam completed.:  Yes  Right Dorsalis Pedis:  Present Left Dorsalis Pedis:  Present  Sensory Foot Exam Completed.:  Yes Swelling:  No Semmes-Weinstein Monofilament Test R Site 1-Great Toe:  Pos L Site 1-Great Toe:  Pos  R Site 4:  Pos L Site 4:  Pos  R Site 5:  Pos L Site 5:  Pos    Comments:  Small resolving blister medial (instep) of LEFT foot; no erythema     Results for orders placed or performed in visit on 10/27/16  Chlamydia/Gonococcus/Trichomonas, NAA  Result Value Ref Range   Chlamydia by NAA Negative Negative   Gonococcus by NAA Negative Negative   Trich vag by NAA Negative Negative  POCT urine pregnancy  Result Value Ref Range   Preg Test, Ur Negative Negative  POCT Wet Prep with KOH  Result Value Ref Range   Trichomonas, UA Negative    Clue Cells Wet Prep HPF POC neg    Epithelial Wet Prep HPF POC  Few, Moderate, Many, Too numerous to count   Yeast Wet Prep HPF POC neg    Bacteria Wet Prep HPF POC  Few   RBC Wet Prep HPF POC     WBC Wet Prep HPF POC     KOH Prep POC Negative Negative      Assessment & Plan:   Problem List Items Addressed This Visit      Endocrine   Diabetes mellitus type 2, controlled (HCC) (Chronic)    Foot exam done; keep close eye on the small blister on left foot       Other Visit Diagnoses    Screen for STD (sexually transmitted disease)    -  Primary   safe sex encouraged; will check labs for STD testing; see AVS   Relevant Orders   GC/Chlamydia Probe Amp   Hepatitis panel, acute   HIV antibody   RPR   Pruritus       posterior thighs,  between legs; doubt contact,  likely heat or stress; no prescriptions needed       Follow up plan: No Follow-up on file.  An after-visit summary was printed and given to the patient at check-out.  Please see the patient instructions which may contain other information and recommendations beyond what is mentioned above in the assessment and plan.  Meds ordered this encounter  Medications  . Vitamin D, Ergocalciferol, (DRISDOL) 50000 units CAPS capsule    Sig: Take 1 capsule (50,000 Units total) by mouth every 30 (thirty) days.    Dispense:  1 capsule    Refill:  2    Orders Placed This Encounter  Procedures  . GC/Chlamydia Probe Amp  . Hepatitis panel, acute  . HIV antibody  . RPR

## 2016-11-03 NOTE — Patient Instructions (Addendum)
Safe Sex Practicing safe sex means taking steps before and during sex to reduce your risk of:  Getting an STD (sexually transmitted disease).  Giving your partner an STD.  Unwanted pregnancy. How can I practice safe sex?   To practice safe sex:  Limit your sexual partners to only one partner who is having sex with only you.  Avoid using alcohol and recreational drugs before having sex. These substances can affect your judgment.  Before having sex with a new partner:  Talk to your partner about past partners, past STDs, and drug use.  You and your partner should be screened for STDs and discuss the results with each other.  Check your body regularly for sores, blisters, rashes, or unusual discharge. If you notice any of these problems, visit your health care provider.  If you have symptoms of an infection or you are being treated for an STD, avoid sexual contact.  While having sex, use a condom. Make sure to:  Use a condom every time you have vaginal, oral, or anal sex. Both females and males should wear condoms during oral sex.  Keep condoms in place from the beginning to the end of sexual activity.  Use a latex condom, if possible. Latex condoms offer the best protection.  Use only water-based lubricants or oils to lubricate a condom. Using petroleum-based lubricants or oils will weaken the condom and increase the chance that it will break.  See your health care provider for regular screenings, exams, and tests for STDs.  Talk with your health care provider about the form of birth control (contraception) that is best for you.  Get vaccinated against hepatitis B and human papillomavirus (HPV).  If you are at risk of being infected with HIV (human immunodeficiency virus), talk with your health care provider about taking a prescription medicine to prevent HIV infection. You are considered at risk for HIV if:  You are a man who has sex with other men.  You are a  heterosexual man or woman who is sexually active with more than one partner.  You take drugs by injection.  You are sexually active with a partner who has HIV. This information is not intended to replace advice given to you by your health care provider. Make sure you discuss any questions you have with your health care provider. Document Released: 07/07/2004 Document Revised: 10/14/2015 Document Reviewed: 04/19/2015 Elsevier Interactive Patient Education  2017 [employer]  

## 2016-11-03 NOTE — Assessment & Plan Note (Addendum)
Foot exam done; keep close eye on the small blister on left foot

## 2016-11-04 LAB — GC/CHLAMYDIA PROBE AMP
CT Probe RNA: NOT DETECTED
GC Probe RNA: NOT DETECTED

## 2016-11-04 LAB — SYPHILIS: RPR W/REFLEX TO RPR TITER AND TREPONEMAL ANTIBODIES, TRADITIONAL SCREENING AND DIAGNOSIS ALGORITHM

## 2016-11-06 ENCOUNTER — Other Ambulatory Visit: Payer: Self-pay | Admitting: Family Medicine

## 2016-11-06 NOTE — Telephone Encounter (Signed)
rx approved

## 2016-11-10 ENCOUNTER — Encounter: Payer: Medicaid Other | Admitting: Family Medicine

## 2016-11-18 ENCOUNTER — Telehealth: Payer: Self-pay

## 2016-11-18 NOTE — Telephone Encounter (Signed)
PT called triage line stating she has been using the Metrogel 2xweekly as prescribed by SDJ. Last night she had intercourse and noticed clumpy white discharge/no odor/no color/no other irritation. She just wants to make sure she shouldn't be worried. Pt aware it sounds like it could be a mixture of gel plus after effects of intercourse. She is aware if above S&S are noticed she is to call for an appointment. She was very thankful of call. KJ CMA

## 2016-11-20 ENCOUNTER — Encounter: Payer: Self-pay | Admitting: Emergency Medicine

## 2016-11-20 DIAGNOSIS — R197 Diarrhea, unspecified: Secondary | ICD-10-CM | POA: Diagnosis not present

## 2016-11-20 DIAGNOSIS — I10 Essential (primary) hypertension: Secondary | ICD-10-CM | POA: Insufficient documentation

## 2016-11-20 DIAGNOSIS — E119 Type 2 diabetes mellitus without complications: Secondary | ICD-10-CM | POA: Insufficient documentation

## 2016-11-20 DIAGNOSIS — R110 Nausea: Secondary | ICD-10-CM | POA: Diagnosis not present

## 2016-11-20 DIAGNOSIS — R1084 Generalized abdominal pain: Secondary | ICD-10-CM | POA: Diagnosis present

## 2016-11-20 DIAGNOSIS — Z793 Long term (current) use of hormonal contraceptives: Secondary | ICD-10-CM | POA: Diagnosis not present

## 2016-11-20 DIAGNOSIS — Z79899 Other long term (current) drug therapy: Secondary | ICD-10-CM | POA: Diagnosis not present

## 2016-11-20 DIAGNOSIS — Z7984 Long term (current) use of oral hypoglycemic drugs: Secondary | ICD-10-CM | POA: Diagnosis not present

## 2016-11-20 DIAGNOSIS — K5289 Other specified noninfective gastroenteritis and colitis: Secondary | ICD-10-CM | POA: Insufficient documentation

## 2016-11-20 LAB — CBC
HCT: 40.1 % (ref 35.0–47.0)
Hemoglobin: 13.5 g/dL (ref 12.0–16.0)
MCH: 28.7 pg (ref 26.0–34.0)
MCHC: 33.7 g/dL (ref 32.0–36.0)
MCV: 85.3 fL (ref 80.0–100.0)
PLATELETS: 227 10*3/uL (ref 150–440)
RBC: 4.7 MIL/uL (ref 3.80–5.20)
RDW: 13.8 % (ref 11.5–14.5)
WBC: 5.1 10*3/uL (ref 3.6–11.0)

## 2016-11-20 LAB — COMPREHENSIVE METABOLIC PANEL
ALT: 30 U/L (ref 14–54)
AST: 31 U/L (ref 15–41)
Albumin: 4 g/dL (ref 3.5–5.0)
Alkaline Phosphatase: 42 U/L (ref 38–126)
Anion gap: 7 (ref 5–15)
BILIRUBIN TOTAL: 0.6 mg/dL (ref 0.3–1.2)
BUN: 6 mg/dL (ref 6–20)
CO2: 27 mmol/L (ref 22–32)
CREATININE: 0.7 mg/dL (ref 0.44–1.00)
Calcium: 8.9 mg/dL (ref 8.9–10.3)
Chloride: 102 mmol/L (ref 101–111)
Glucose, Bld: 114 mg/dL — ABNORMAL HIGH (ref 65–99)
Potassium: 3.5 mmol/L (ref 3.5–5.1)
Sodium: 136 mmol/L (ref 135–145)
TOTAL PROTEIN: 7.3 g/dL (ref 6.5–8.1)

## 2016-11-20 LAB — URINALYSIS, COMPLETE (UACMP) WITH MICROSCOPIC
BILIRUBIN URINE: NEGATIVE
Bacteria, UA: NONE SEEN
GLUCOSE, UA: NEGATIVE mg/dL
KETONES UR: NEGATIVE mg/dL
Leukocytes, UA: NEGATIVE
NITRITE: NEGATIVE
PH: 7 (ref 5.0–8.0)
Protein, ur: NEGATIVE mg/dL
SPECIFIC GRAVITY, URINE: 1.014 (ref 1.005–1.030)

## 2016-11-20 LAB — LIPASE, BLOOD: Lipase: 21 U/L (ref 11–51)

## 2016-11-20 LAB — POCT PREGNANCY, URINE: Preg Test, Ur: NEGATIVE

## 2016-11-20 NOTE — ED Triage Notes (Signed)
Pt ambulatory to triage with steady gait, no distress noted. Pt c/o lower abdominal pain and diarrhea consistently since this AM. Pt denies N/V.

## 2016-11-21 ENCOUNTER — Emergency Department
Admission: EM | Admit: 2016-11-21 | Discharge: 2016-11-21 | Disposition: A | Discharge status: Home / Self Care | Payer: Medicaid Other | Attending: Emergency Medicine | Admitting: Emergency Medicine

## 2016-11-21 DIAGNOSIS — K529 Noninfective gastroenteritis and colitis, unspecified: Secondary | ICD-10-CM

## 2016-11-21 MED ORDER — ONDANSETRON 4 MG PO TBDP
4.0000 mg | ORAL_TABLET | Freq: Once | ORAL | Status: AC
  Administered 2016-11-21: 4 mg via ORAL
  Filled 2016-11-21: qty 1

## 2016-11-21 MED ORDER — LOPERAMIDE HCL 2 MG PO CAPS
4.0000 mg | ORAL_CAPSULE | Freq: Once | ORAL | Status: AC
  Administered 2016-11-21: 4 mg via ORAL
  Filled 2016-11-21: qty 2

## 2016-11-21 MED ORDER — LOPERAMIDE HCL 2 MG PO TABS: 2.0000 mg | ORAL_TABLET | Freq: Four times a day (QID) | ORAL | 0 refills | Status: DC | PRN

## 2016-11-21 MED ORDER — GI COCKTAIL ~~LOC~~
30.0000 mL | Freq: Once | ORAL | Status: AC
  Administered 2016-11-21: 30 mL via ORAL
  Filled 2016-11-21: qty 30

## 2016-11-21 MED ORDER — ONDANSETRON 4 MG PO TBDP: 4.0000 mg | ORAL_TABLET | Freq: Three times a day (TID) | ORAL | 0 refills | Status: DC | PRN

## 2016-11-21 NOTE — ED Provider Notes (Addendum)
[facility] Emergency Department Provider Note  Time seen: 12:30 AM  I have reviewed the triage vital signs and the nursing notes.   HISTORY  Chief Complaint Abdominal Pain and Diarrhea    HPI Penny AmosSamantha B Kohlmeyer is a 21 y.o. female with a past medical history of diabetes, hypertension, presents the emergency department for nausea and diarrhea. According to the patient since 6:00 this morning she awoke with diarrhea. Has been nauseated throughout the day but has not vomited. States a burning sensation in her upper abdomen. Denies any lower abdominal pain. Denies vaginal discharge, currently on her period. Denies dysuria. Describes her abdominal pain as mild to moderate burning sensation in the upper abdomen.  Past Medical History:  Diagnosis Date  . Anemia, iron deficiency, inadequate dietary intake 12/01/2014  . Bronchitis 04/2016   dr lada cleared pt medically for surgery after cxr results were normal (05-16-16)  . Cold 05/12/2016   RESOLVING PER PT  . Depression    patient has not been officially diagnosed but has symptoms  . Diabetes mellitus without complication (HCC)   . Essential hypertension, benign 03/31/2016  . Essential hypertension, benign 03/31/2016  . Irregular periods/menstrual cycles   . Joint pain of leg   . Major depression 11/23/2015    Patient Active Problem List   Diagnosis Date Noted  . Bacterial vaginosis 10/14/2016  . Allergic rhinitis 09/23/2016  . Fever blister 08/21/2016  . Abdominal pain, RLQ 07/11/2016  . Fatty liver 05/07/2016  . Foot pain, bilateral 03/31/2016  . Essential hypertension, benign 03/31/2016  . Proteinuria 01/30/2016  . Major depression 11/23/2015  . Vitamin D deficiency 11/19/2015  . Fatigue 11/18/2015  . Preventative health care 10/27/2015  . Medication monitoring encounter 10/27/2015  . Constipation, slow transit 02/18/2015  . Tonsil stone 02/18/2015  . Diabetes mellitus type 2, controlled (HCC)  11/26/2014  . Headache disorder 11/26/2014  . Arthralgia of multiple joints 11/26/2014  . Morbid obesity (HCC) 11/26/2014  . Contact with and suspected exposure to infections with predominantly sexual mode of transmission 11/26/2014    Past Surgical History:  Procedure Laterality Date  . CHOLECYSTECTOMY N/A 05/19/2016   Procedure: LAPAROSCOPIC CHOLECYSTECTOMY WITH INTRAOPERATIVE CHOLANGIOGRAM;  Surgeon: Nadeen LandauJarvis Wilton Smith, MD;  Location: [facility] ORS;  Service: General;  Laterality: N/A;    Prior to Admission medications   Medication Sig Start Date End Date Taking? Authorizing Provider  metFORMIN (GLUCOPHAGE-XR) 500 MG 24 hr tablet Take 1 tablet (500 mg total) by mouth daily. 10/14/16   Kerman PasseyLada, Melinda P, MD  metroNIDAZOLE (METROGEL) 0.75 % vaginal gel Place 1 Applicatorful vaginally 2 (two) times a week. 10/13/16 03/28/17  Conard NovakJackson, Stephen D, MD  norelgestromin-ethinyl estradiol Burr Medico(XULANE) 150-35 MCG/24HR transdermal patch Place 1 patch onto the skin once a week. With one week off, then repeat 11/06/16   Kerman PasseyLada, Melinda P, MD  nystatin (MYCOSTATIN/NYSTOP) powder Apply topically 3 (three) times daily. 09/23/16   Kerman PasseyLada, Melinda P, MD  Vitamin D, Ergocalciferol, (DRISDOL) 50000 units CAPS capsule Take 1 capsule (50,000 Units total) by mouth every 30 (thirty) days. 11/03/16 02/01/17  Kerman PasseyLada, Melinda P, MD    No Known Allergies  Family History  Problem Relation Age of Onset  . Diabetes Mother   . Arthritis Father   . Diabetes Father   . Heart disease Father   . Hypertension Father   . Stroke Father   . Diabetes Maternal Grandmother   . Hypertension Maternal Grandmother   . Heart disease Paternal Grandmother   .  Hypertension Paternal Grandmother   . Lupus Sister   . Deafness Sister     Social History Social History  Substance Use Topics  . Smoking status: Never Smoker  . Smokeless tobacco: Never Used  . Alcohol use 0.0 oz/week     Comment: OCC    Review of Systems Constitutional: Negative for  fever. Cardiovascular: Negative for chest pain. Respiratory: Negative for shortness of breath. Gastrointestinal: Mild to moderate upper abdominal discomfort, burning. Positive for nausea. Negative for vomiting. Positive for diarrhea. Genitourinary: Negative for dysuria. Musculoskeletal: Negative for back pain. Neurological: Negative for headache All other ROS negative  ____________________________________________   PHYSICAL EXAM:  VITAL SIGNS: ED Triage Vitals [11/20/16 2029]  Enc Vitals Group     BP 137/90     Pulse Rate 94     Resp 16     Temp 98.5 F (36.9 C)     Temp Source Oral     SpO2 99 %     Weight 235 lb (106.6 kg)     Height 5\' 9"  (1.753 m)     Head Circumference      Peak Flow      Pain Score      Pain Loc      Pain Edu?      Excl. in GC?     Constitutional: Alert and oriented. Well appearing and in no distress. Eyes: Normal exam ENT   Head: Normocephalic and atraumatic.   Mouth/Throat: Mucous membranes are moist. Cardiovascular: Normal rate, regular rhythm. No murmur Respiratory: Normal respiratory effort without tachypnea nor retractions. Breath sounds are clear  Gastrointestinal: Soft, mild epigastric tenderness palpation. No rebound or guarding. No distention Musculoskeletal: Nontender with normal range of motion in all extremities.  Neurologic:  Normal speech and language. No gross focal neurologic deficits Skin:  Skin is warm, dry and intact.  Psychiatric: Mood and affect are normal.  ____________________________________________   INITIAL IMPRESSION / ASSESSMENT AND PLAN / ED COURSE  Pertinent labs & imaging results that were available during my care of the patient were reviewed by me and considered in my medical decision making (see chart for details).  The patient presents to emergency department nausea and diarrhea with upper abdominal burning. Patient's labs are largely within normal limits, mild epigastric tenderness otherwise benign  abdominal exam. Patient states her main concern is frequent diarrhea and not being able to work. Will treat with loperamide, Zofran and a GI cocktail. With a normal white blood cell count and symptoms starting today and a largely nontender abdomen besides slight epigastric tenderness I do not believe the patient requires abdominal imaging at this time. She is status post cholecystectomy last year and no lower abdominal tenderness.  Symptoms have improved. Highly suspect gastroenteritis. We'll discharge with loperamide and Zofran. Patient agreeable to plan. I discussed abdominal pain return precautions. ____________________________________________   FINAL CLINICAL IMPRESSION(S) / ED DIAGNOSES  Nausea and diarrhea    Minna Antis, MD 11/21/16 0136    Minna Antis, MD 11/21/16 [id number]

## 2016-11-25 ENCOUNTER — Encounter: Payer: Self-pay | Admitting: Family Medicine

## 2016-11-25 MED ORDER — TRIAMCINOLONE ACETONIDE 0.1 % EX CREA: 1.0000 "application " | TOPICAL_CREAM | Freq: Two times a day (BID) | CUTANEOUS | 0 refills | Status: DC

## 2016-11-29 ENCOUNTER — Encounter: Payer: Self-pay | Admitting: Family Medicine

## 2016-11-29 ENCOUNTER — Ambulatory Visit (INDEPENDENT_AMBULATORY_CARE_PROVIDER_SITE_OTHER): Payer: Medicaid Other | Admitting: Family Medicine

## 2016-11-29 VITALS — BP 122/84 | HR 96 | Temp 98.1°F | Resp 16 | Ht 69.0 in | Wt 234.7 lb

## 2016-11-29 DIAGNOSIS — L299 Pruritus, unspecified: Secondary | ICD-10-CM | POA: Diagnosis not present

## 2016-11-29 DIAGNOSIS — Z113 Encounter for screening for infections with a predominantly sexual mode of transmission: Secondary | ICD-10-CM

## 2016-11-29 DIAGNOSIS — N760 Acute vaginitis: Secondary | ICD-10-CM

## 2016-11-29 DIAGNOSIS — B9689 Other specified bacterial agents as the cause of diseases classified elsewhere: Secondary | ICD-10-CM

## 2016-11-29 MED ORDER — HYDROXYZINE PAMOATE 25 MG PO CAPS: 25.0000 mg | ORAL_CAPSULE | Freq: Three times a day (TID) | ORAL | 0 refills | Status: DC | PRN

## 2016-11-29 NOTE — Progress Notes (Addendum)
Name: Penny Gonzalez   MRN: [id number]    DOB: Jun 16, 1995   Date:11/29/2016       Progress Note  Subjective  Chief Complaint  Chief Complaint  Patient presents with  . Follow-up    back of legs and thighs itchy    HPI  Pt presents with itching and bumps to bilateral legs (worse on left) and buttocks. She was seen by Dr. Sanda Klein (PCP) for the same on 11/03/16, and was given Triamcinolone cream which she has been using for about a week with minimal relief. Has not taken benadryl or other antihistamines.  Vaginal Discharge: She notes having some variation in vaginal discharge from clumpy white discharge to clear mucus.  PT using patch for contraception, having heavy periods monthly. She is under the care of [facility] for vaginal discharge and is using Metrogel twice weekly. Reviewed note from Kimberly Martinique, [place] with [employer].  Advised she should schedule a follow up appointment with her OB/GYN to address these issues.  STI Testing: Pt requests STI testing. Using condoms with new partner. Has not been using condoms with another partner. Has sex with men only.  Had negative STI panel on 11/03/2016. She requests GC/Chlamydia urine testing again today due to new partner. Declines HIV/RPR testing.  States no symptoms aside from vaginal discharge described above.  Patient Active Problem List   Diagnosis Date Noted  . Bacterial vaginosis 10/14/2016  . Allergic rhinitis 09/23/2016  . Fever blister 08/21/2016  . Abdominal pain, RLQ 07/11/2016  . Fatty liver 05/07/2016  . Foot pain, bilateral 03/31/2016  . Essential hypertension, benign 03/31/2016  . Proteinuria 01/30/2016  . Major depression 11/23/2015  . Vitamin D deficiency 11/19/2015  . Fatigue 11/18/2015  . Preventative health care 10/27/2015  . Medication monitoring encounter 10/27/2015  . Constipation, slow transit 02/18/2015  . Tonsil stone 02/18/2015  . Diabetes mellitus type 2, controlled ([place]) 11/26/2014  . Headache  disorder 11/26/2014  . Arthralgia of multiple joints 11/26/2014  . Morbid obesity ([place]) 11/26/2014  . Contact with and suspected exposure to infections with predominantly sexual mode of transmission 11/26/2014    Social History  Substance Use Topics  . Smoking status: Never Smoker  . Smokeless tobacco: Never Used  . Alcohol use 0.0 oz/week     Comment: OCC     Current Outpatient Prescriptions:  .  loperamide (IMODIUM A-D) 2 MG tablet, Take 1 tablet (2 mg total) by mouth 4 (four) times daily as needed for diarrhea or loose stools., Disp: 20 tablet, Rfl: 0 .  metFORMIN (GLUCOPHAGE-XR) 500 MG 24 hr tablet, Take 1 tablet (500 mg total) by mouth daily., Disp: 30 tablet, Rfl: 5 .  metroNIDAZOLE (METROGEL) 0.75 % vaginal gel, Place 1 Applicatorful vaginally 2 (two) times a week., Disp: 25 g, Rfl: 2 .  norelgestromin-ethinyl estradiol Marilu Favre) 150-35 MCG/24HR transdermal patch, Place 1 patch onto the skin once a week. With one week off, then repeat, Disp: 9 patch, Rfl: 1 .  nystatin (MYCOSTATIN/NYSTOP) powder, Apply topically 3 (three) times daily., Disp: 15 g, Rfl: 0 .  triamcinolone cream (KENALOG) 0.1 %, Apply 1 application topically 2 (two) times daily., Disp: 30 g, Rfl: 0 .  ondansetron (ZOFRAN ODT) 4 MG disintegrating tablet, Take 1 tablet (4 mg total) by mouth every 8 (eight) hours as needed for nausea or vomiting. (Patient not taking: Reported on 11/29/2016), Disp: 20 tablet, Rfl: 0 .  Vitamin D, Ergocalciferol, (DRISDOL) 50000 units CAPS capsule, Take 1 capsule (50,000 Units  total) by mouth every 30 (thirty) days. (Patient not taking: Reported on 11/29/2016), Disp: 1 capsule, Rfl: 2  No Known Allergies  ROS  Constitutional: Negative for fever or weight change.  Respiratory: Negative for cough and shortness of breath.   Cardiovascular: Negative for chest pain or palpitations.  Gastrointestinal: Negative for abdominal pain, no bowel changes.  GU: Negative for dysuria. See  HPI Musculoskeletal: Negative for gait problem or joint swelling.  Skin: See HPI Neurological: Negative for dizziness or headache.  No other specific complaints in a complete review of systems (except as listed in HPI above).  Objective  Vitals:   11/29/16 1029  BP: 122/84  Pulse: 96  Resp: 16  Temp: 98.1 F (36.7 C)  TempSrc: Oral  SpO2: 97%  Weight: 234 lb 11.2 oz (106.5 kg)  Height: _0  (1.753 m)    Body mass index is 34.66 kg/m.  Nursing Note and Vital Signs reviewed.  Physical Exam  Constitutional: Patient appears well-developed and well-nourished. Obese No distress.  HEENT: head atraumatic, normocephalic Cardiovascular: Normal rate, regular rhythm, S1/S2 present.  No murmur or rub heard. No BLE edema. Pulmonary/Chest: Effort normal and breath sounds clear. No respiratory distress or retractions. Abdominal: Soft and non-tender, bowel sounds present x4 quadrants. Skin: 2 small flesh colored papules to right inner thigh and 4-5 flesh colored papules to right buttock. No excoriation, erythema, or tenderness. Psychiatric: Patient has a normal mood and affect. behavior is normal. Judgment and thought content normal.  Recent Results (from the past 2160 hour(s))  GC/Chlamydia Probe Amp     Status: None   Collection Time: 09/01/16  9:57 AM  Result Value Ref Range   CT Probe RNA NOT DETECTED     Comment:                    **Normal Reference Range: NOT DETECTED**   This test was performed using the APTIMA COMBO2 Assay ([place].).   The analytical performance characteristics of this assay, when used to test SurePath specimens have been determined by [facility]      GC Probe RNA NOT DETECTED     Comment:                    **Normal Reference Range: NOT DETECTED**   This test was performed using the APTIMA COMBO2 Assay ([place].).   The analytical performance characteristics of this assay, when used to test SurePath specimens have been  determined by [place]     Status: Abnormal   Collection Time: 09/01/16  9:57 AM  Result Value Ref Range   Candida species DETECTED (A) NOT DETECTED   Trichomonas vaginosis NOT DETECTED NOT DETECTED   Gardnerella vaginalis NOT DETECTED NOT DETECTED  HIV antibody (with reflex)     Status: None   Collection Time: 09/01/16 10:51 AM  Result Value Ref Range   HIV 1&2 Ab, 4th Generation NONREACTIVE NONREACTIVE    Comment:   HIV-1 antigen and HIV-1/HIV-2 antibodies were not detected.  There is no laboratory evidence of HIV infection.   HIV-1/2 Antibody Diff        Not indicated. HIV-1 RNA, Qual TMA          Not indicated.     PLEASE NOTE: This information has been disclosed to you from records whose confidentiality may be protected by state law. If your state requires such protection, then the state law prohibits  you from making any further disclosure of the information without the specific written consent of the person to whom it pertains, or as otherwise permitted by law. A general authorization for the release of medical or other information is NOT sufficient for this purpose.   The performance of this assay has not been clinically validated in patients less than 58 years old.   For additional information please refer to http://education.[facility].com/faq/FAQ106.  (This link is being provided for informational/educational purposes only.)     POCT urine pregnancy     Status: Normal   Collection Time: 09/23/16  8:43 AM  Result Value Ref Range   Preg Test, Ur Negative Negative  NuSwab Vaginitis (VG)     Status: Abnormal   Collection Time: 09/30/16 12:23 PM  Result Value Ref Range   Atopobium vaginae High - 2 (A) Score   BVAB 2 High - 2 (A) Score   Megasphaera 1 Low - 0 Score    Comment: Calculate total score by adding the 3 individual bacterial vaginosis (BV) marker scores together.  Total score is interpreted as follows: Total  score 0-1: Indicates the absence of BV. Total score   2: Indeterminate for BV. Additional clinical                  data should be evaluated to establish a                  diagnosis. Total score 3-6: Indicates the presence of BV. This test was developed and its performance characteristics determined by [facility].  It has not been cleared or approved by the Food and Drug Administration.  The FDA has determined that such clearance or approval is not necessary.    Candida albicans, NAA Negative Negative   Candida glabrata, NAA Negative Negative    Comment: This test was developed and its performance characteristics determined by [facility].  It has not been cleared or approved by the Food and Drug Administration.  The FDA has determined that such clearance or approval is not necessary.    Trich vag by NAA Negative Negative  POCT Wet Prep with KOH     Status: Abnormal   Collection Time: 10/12/16  1:41 PM  Result Value Ref Range   Trichomonas, UA Negative    Clue Cells Wet Prep HPF POC Present     Comment: ~50% clue cells   Epithelial Wet Prep HPF POC  Few, Moderate, Many, Too numerous to count   Yeast Wet Prep HPF POC     Bacteria Wet Prep HPF POC  Few   RBC Wet Prep HPF POC     WBC Wet Prep HPF POC     KOH Prep POC Negative Negative   PH, VAGINAL 6.0   Chlamydia/Gonococcus/Trichomonas, NAA     Status: None   Collection Time: 10/12/16  1:42 PM  Result Value Ref Range   Chlamydia by NAA Negative Negative   Gonococcus by NAA Negative Negative   Trich vag by NAA Negative Negative  HM DIABETES EYE EXAM     Status: None   Collection Time: 10/13/16 12:00 AM  Result Value Ref Range   HM Diabetic Eye Exam No Retinopathy No Retinopathy  TSH     Status: None   Collection Time: 10/14/16 10:59 AM  Result Value Ref Range   TSH 0.62 mIU/L    Comment:   Reference Range   > or = 20 Years  0.40-4.50   Pregnancy Range First trimester  0.26-2.66  Second trimester 0.55-2.73 Third trimester   0.43-2.91     VITAMIN D 25 Hydroxy (Vit-D Deficiency, Fractures)     Status: Abnormal   Collection Time: 10/14/16 10:59 AM  Result Value Ref Range   Vit D, 25-Hydroxy 22 (L) 30 - 100 ng/mL    Comment: Vitamin D Status           25-OH Vitamin D        Deficiency                <20 ng/mL        Insufficiency         20 - 29 ng/mL        Optimal             > or = 30 ng/mL   For 25-OH Vitamin D testing on patients on D2-supplementation and patients for whom quantitation of D2 and D3 fractions is required, the Quest[facility] 25-OH VIT D, (D2,D3), LC/MS/MS is recommended: order code [id number] (patients > 2 yrs).   Hemoglobin A1c     Status: Abnormal   Collection Time: 10/14/16 10:59 AM  Result Value Ref Range   Hgb A1c MFr Bld 6.4 (H) <5.7 %    Comment:   For someone without known diabetes, a hemoglobin A1c value between 5.7% and 6.4% is consistent with prediabetes and should be confirmed with a follow-up test.   For someone with known diabetes, a value <7% indicates that their diabetes is well controlled. A1c targets should be individualized based on duration of diabetes, age, co-morbid conditions and other considerations.   This assay result is consistent with an increased risk of diabetes.   Currently, no consensus exists regarding use of hemoglobin A1c for diagnosis of diabetes in children.      Mean Plasma Glucose 137 mg/dL  POCT Wet Prep with KOH     Status: Normal   Collection Time: 10/27/16  2:28 PM  Result Value Ref Range   Trichomonas, UA Negative    Clue Cells Wet Prep HPF POC neg    Epithelial Wet Prep HPF POC  Few, Moderate, Many, Too numerous to count   Yeast Wet Prep HPF POC neg    Bacteria Wet Prep HPF POC  Few   RBC Wet Prep HPF POC     WBC Wet Prep HPF POC     KOH Prep POC Negative Negative  POCT urine pregnancy     Status: Normal   Collection Time: 10/27/16  2:37 PM  Result Value Ref Range   Preg Test, Ur Negative Negative   Chlamydia/Gonococcus/Trichomonas, NAA     Status: None   Collection Time: 10/27/16  3:39 PM  Result Value Ref Range   Chlamydia by NAA Negative Negative   Gonococcus by NAA Negative Negative   Trich vag by NAA Negative Negative  GC/Chlamydia Probe Amp     Status: None   Collection Time: 11/03/16  9:59 AM  Result Value Ref Range   CT Probe RNA NOT DETECTED     Comment:                    **Normal Reference Range: NOT DETECTED**   This test was performed using the APTIMA COMBO2 Assay ([employer]   The analytical performance characteristics of this assay, when used to test SurePath specimens have been determined by [facility]      GC Probe RNA NOT DETECTED     Comment:                    **  Normal Reference Range: NOT DETECTED**   This test was performed using the APTIMA COMBO2 Assay ([place].).   The analytical performance characteristics of this assay, when used to test SurePath specimens have been determined by [facility]     Hepatitis panel, acute     Status: None   Collection Time: 11/03/16 10:11 AM  Result Value Ref Range   Hepatitis B Surface Ag NEGATIVE NEGATIVE   HCV Ab NEGATIVE NEGATIVE   Hep B C IgM NON REACTIVE NON REACTIVE    Comment: High levels of Hepatitis B Core IgM antibody are detectable during the acute stage of Hepatitis B. This antibody is used to differentiate current from past HBV infection.      Hep A IgM NON REACTIVE NON REACTIVE    Comment:   Effective April 28, 2014, Hepatitis Acute Panel (test code [id number]) will be revised to automatically reflex to the Hepatitis C Viral RNA, Quantitative, Real-Time PCR assay if the Hepatitis C antibody screening result is Reactive. This action is being taken to ensure that the CDC/USPSTF recommended HCV diagnostic algorithm with the appropriate test reflex needed for accurate interpretation is followed.     HIV antibody     Status: None   Collection Time: 11/03/16 10:11 AM   Result Value Ref Range   HIV 1&2 Ab, 4th Generation NONREACTIVE NONREACTIVE    Comment:   HIV-1 antigen and HIV-1/HIV-2 antibodies were not detected.  There is no laboratory evidence of HIV infection.   HIV-1/2 Antibody Diff        Not indicated. HIV-1 RNA, Qual TMA          Not indicated.     PLEASE NOTE: This information has been disclosed to you from records whose confidentiality may be protected by state law. If your state requires such protection, then the state law prohibits you from making any further disclosure of the information without the specific written consent of the person to whom it pertains, or as otherwise permitted by law. A general authorization for the release of medical or other information is NOT sufficient for this purpose.   The performance of this assay has not been clinically validated in patients less than 45 years old.   For additional information please refer to http://education.[facility].com/faq/FAQ106.  (This link is being provided for informational/educational purposes only.)     RPR     Status: None   Collection Time: 11/03/16 10:11 AM  Result Value Ref Range   RPR Ser Ql NON REAC NON REAC  Lipase, blood     Status: None   Collection Time: 11/20/16  8:27 PM  Result Value Ref Range   Lipase 21 11 - 51 U/L  Comprehensive metabolic panel     Status: Abnormal   Collection Time: 11/20/16  8:27 PM  Result Value Ref Range   Sodium 136 135 - 145 mmol/L   Potassium 3.5 3.5 - 5.1 mmol/L   Chloride 102 101 - 111 mmol/L   CO2 27 22 - 32 mmol/L   Glucose, Bld 114 (H) 65 - 99 mg/dL   BUN 6 6 - 20 mg/dL   Creatinine, Ser 0.70 0.44 - 1.00 mg/dL   Calcium 8.9 8.9 - 10.3 mg/dL   Total Protein 7.3 6.5 - 8.1 g/dL   Albumin 4.0 3.5 - 5.0 g/dL   AST 31 15 - 41 U/L   ALT 30 14 - 54 U/L   Alkaline Phosphatase 42 38 - 126 U/L   Total Bilirubin 0.6 0.3 -  1.2 mg/dL   GFR calc non Af Amer >60 >60 mL/min   GFR calc Af Amer >60 >60 mL/min    Comment:  (NOTE) The eGFR has been calculated using the CKD EPI equation. This calculation has not been validated in all clinical situations. eGFR's persistently <60 mL/min signify possible Chronic Kidney Disease.    Anion gap 7 5 - 15  CBC     Status: None   Collection Time: 11/20/16  8:27 PM  Result Value Ref Range   WBC 5.1 3.6 - 11.0 K/uL   RBC 4.70 3.80 - 5.20 MIL/uL   Hemoglobin 13.5 12.0 - 16.0 g/dL   HCT 40.1 35.0 - 47.0 %   MCV 85.3 80.0 - 100.0 fL   MCH 28.7 26.0 - 34.0 pg   MCHC 33.7 32.0 - 36.0 g/dL   RDW 13.8 11.5 - 14.5 %   Platelets 227 150 - 440 K/uL  Urinalysis, Complete w Microscopic     Status: Abnormal   Collection Time: 11/20/16  8:27 PM  Result Value Ref Range   Color, Urine YELLOW (A) YELLOW   APPearance CLEAR (A) CLEAR   Specific Gravity, Urine 1.014 1.005 - 1.030   pH 7.0 5.0 - 8.0   Glucose, UA NEGATIVE NEGATIVE mg/dL   Hgb urine dipstick LARGE (A) NEGATIVE   Bilirubin Urine NEGATIVE NEGATIVE   Ketones, ur NEGATIVE NEGATIVE mg/dL   Protein, ur NEGATIVE NEGATIVE mg/dL   Nitrite NEGATIVE NEGATIVE   Leukocytes, UA NEGATIVE NEGATIVE   RBC / HPF TOO NUMEROUS TO COUNT 0 - 5 RBC/hpf   WBC, UA 0-5 0 - 5 WBC/hpf   Bacteria, UA NONE SEEN NONE SEEN   Squamous Epithelial / LPF 0-5 (A) NONE SEEN   Mucous PRESENT   Pregnancy, urine POC     Status: None   Collection Time: 11/20/16  8:37 PM  Result Value Ref Range   Preg Test, Ur NEGATIVE NEGATIVE    Comment:        THE SENSITIVITY OF THIS METHODOLOGY IS >24 mIU/mL      Assessment & Plan  1. Pruritus - hydrOXYzine (VISTARIL) 25 MG capsule; Take 1 capsule (25 mg total) by mouth every 8 (eight) hours as needed for itching.  Dispense: 30 capsule; Refill: 0 - Discussed possible side effect of medication can be drowsiness, she is advised to avoid medication within 8 hours of driving/operating heavy machinery/doing tasks that require concentration. - Continue Triamcinolone Cream - Advised likely due to stress or heat  as Dr. Sanda Klein had discussed in previous note. There are no concerning findings for significant rash, allergic reaction, or infection and patient is reassured of this.  2. Bacterial vaginosis Schedule a follow up with OB-GYN to discuss ongoing symptoms. Advised she may try starting a probiotic as tolerated, but she needs to follow up with OB-GYN.  3. Screen for STD (sexually transmitted disease) - GC/Chlamydia Probe Amp - SureSwab, T.vaginalis RNA,Ql,Female  -Red flags and when to present for emergency care or RTC including fever >101.89F, chest pain, shortness of breath, new/worsening/un-resolving symptom, abdominal pain, reviewed with patient at time of visit. Follow up and care instructions discussed and provided in AVS.  I have reviewed this encounter including the documentation in this note and/or discussed this patient with the Johney Maine, FNP, NP-C. I am certifying that I agree with the content of this note as supervising physician.  Steele Sizer, MD [facility] 11/29/2016, 1:08 PM

## 2016-11-29 NOTE — Patient Instructions (Addendum)
Please use Claritin when your itching occurs. Continue Triamcinolone twice daily until itching and/or rash decrease. Please schedule a follow up with your OB-GYN.

## 2016-11-30 ENCOUNTER — Telehealth: Payer: Self-pay

## 2016-11-30 LAB — GC/CHLAMYDIA PROBE AMP
CT Probe RNA: NOT DETECTED
GC Probe RNA: NOT DETECTED

## 2016-11-30 LAB — SURESWAB, T.VAGINALIS RNA,QL,FEMALE: Trichomonas vaginalis RNA: NOT DETECTED

## 2016-11-30 NOTE — Telephone Encounter (Signed)
Pt made appt but wanted to talk to someone about d/c c gel use 2xwk.  Adv to keep appt next Tues and not to use gel night before.

## 2016-12-05 ENCOUNTER — Encounter: Payer: Self-pay | Admitting: Obstetrics and Gynecology

## 2016-12-06 ENCOUNTER — Encounter: Payer: Self-pay | Admitting: Obstetrics and Gynecology

## 2016-12-06 ENCOUNTER — Ambulatory Visit (INDEPENDENT_AMBULATORY_CARE_PROVIDER_SITE_OTHER): Payer: Medicaid Other | Admitting: Obstetrics and Gynecology

## 2016-12-06 VITALS — BP 124/70 | HR 98 | Ht 69.0 in | Wt 234.0 lb

## 2016-12-06 DIAGNOSIS — N898 Other specified noninflammatory disorders of vagina: Secondary | ICD-10-CM | POA: Diagnosis not present

## 2016-12-06 LAB — POCT WET PREP WITH KOH
CLUE CELLS WET PREP PER HPF POC: NEGATIVE
KOH Prep POC: NEGATIVE
TRICHOMONAS UA: NEGATIVE
Yeast Wet Prep HPF POC: NEGATIVE

## 2016-12-06 NOTE — Progress Notes (Signed)
Chief Complaint  Patient presents with  . Vaginitis    discharge (on Metronidazole twice weekly)    HPI:      [name] is a 21 y.o. G1P0101 who LMP was Patient's last menstrual period was 11/23/2016., presents today for continued vaginal discharge. She has a hx of recurrent BV and is using metrogel twice weekly for 4-6 months as preventive maintenance. Pt hasn't had any recurrent BV sx while doing tx. She notes increased d/c and then a clumpy white d/c after sex and is frustrated. She has not had any itching/irritation/fishy odor. No new partners. Pt has had frequent neg gon/chlam testing because she is concerned about exposures. Last neg STD test was 11/29/16 at PCP.     Patient Active Problem List   Diagnosis Date Noted  . Bacterial vaginosis 10/14/2016  . Allergic rhinitis 09/23/2016  . Fever blister 08/21/2016  . Abdominal pain, RLQ 07/11/2016  . Fatty liver 05/07/2016  . Foot pain, bilateral 03/31/2016  . Essential hypertension, benign 03/31/2016  . Proteinuria 01/30/2016  . Major depression 11/23/2015  . Vitamin D deficiency 11/19/2015  . Fatigue 11/18/2015  . Preventative health care 10/27/2015  . Medication monitoring encounter 10/27/2015  . Constipation, slow transit 02/18/2015  . Tonsil stone 02/18/2015  . Diabetes mellitus type 2, controlled ([facility]) 11/26/2014  . Headache disorder 11/26/2014  . Arthralgia of multiple joints 11/26/2014  . Morbid obesity ([facility]) 11/26/2014  . Contact with and suspected exposure to infections with predominantly sexual mode of transmission 11/26/2014    Family History  Problem Relation Age of Onset  . Diabetes Mother   . Arthritis Father   . Diabetes Father   . Heart disease Father   . Hypertension Father   . Stroke Father   . Diabetes Maternal Grandmother   . Hypertension Maternal Grandmother   . Heart disease Paternal Grandmother   . Hypertension Paternal Grandmother   . Lupus Sister   . Deafness Sister      Social History   Social History  . Marital status: Single    Spouse name: N/A  . Number of children: 1  . Years of education: N/A   Occupational History  . Not on file.   Social History Main Topics  . Smoking status: Never Smoker  . Smokeless tobacco: Never Used  . Alcohol use 0.0 oz/week     Comment: OCC  . Drug use: No  . Sexual activity: Yes    Partners: Male    Birth control/ protection: Condom, Patch   Other Topics Concern  . Not on file   Social History Narrative  . No narrative on file     Current Outpatient Prescriptions:  .  hydrOXYzine (VISTARIL) 25 MG capsule, Take 1 capsule (25 mg total) by mouth every 8 (eight) hours as needed for itching., Disp: 30 capsule, Rfl: 0 .  metFORMIN (GLUCOPHAGE-XR) 500 MG 24 hr tablet, Take 1 tablet (500 mg total) by mouth daily., Disp: 30 tablet, Rfl: 5 .  metroNIDAZOLE (METROGEL) 0.75 % vaginal gel, Place 1 Applicatorful vaginally 2 (two) times a week., Disp: 25 g, Rfl: 2 .  norelgestromin-ethinyl estradiol Burr Medico(XULANE) 150-35 MCG/24HR transdermal patch, Place 1 patch onto the skin once a week. With one week off, then repeat, Disp: 9 patch, Rfl: 1 .  nystatin (MYCOSTATIN/NYSTOP) powder, Apply topically 3 (three) times daily., Disp: 15 g, Rfl: 0 .  triamcinolone cream (KENALOG) 0.1 %, Apply 1 application topically 2 (two) times daily., Disp: 30 g, Rfl:  0  Review of Systems  Constitutional: Negative for fever.  Gastrointestinal: Negative for blood in stool, constipation, diarrhea, nausea and vomiting.  Genitourinary: Positive for vaginal discharge. Negative for dyspareunia, dysuria, flank pain, frequency, hematuria, urgency, vaginal bleeding and vaginal pain.  Musculoskeletal: Negative for back pain.  Skin: Negative for rash.     OBJECTIVE:   Vitals:  BP 124/70 (BP Location: Left Arm, Patient Position: Sitting, Cuff Size: Large)   Pulse 98   Ht 5\' 9"  (1.753 m)   Wt 234 lb (106.1 kg)   LMP 11/23/2016   BMI 34.56 kg/m    Physical Exam  Constitutional: She is oriented to person, place, and time and well-developed, well-nourished, and in no distress. Vital signs are normal.  Genitourinary: Uterus normal, cervix normal, right adnexa normal, left adnexa normal and vulva normal. Uterus is not enlarged. Cervix exhibits no motion tenderness and no tenderness. Right adnexum displays no mass and no tenderness. Left adnexum displays no mass and no tenderness. Vulva exhibits no erythema, no exudate, no lesion, no rash and no tenderness. Vagina exhibits no lesion. Thin  odorless  white and vaginal discharge found.  Neurological: She is oriented to person, place, and time.  Vitals reviewed.   Results: Results for orders placed or performed in visit on 12/06/16 (from the past 24 hour(s))  POCT Wet Prep with KOH     Status: Normal   Collection Time: 12/06/16 10:52 AM  Result Value Ref Range   Trichomonas, UA Negative    Clue Cells Wet Prep HPF POC neg    Epithelial Wet Prep HPF POC  Few, Moderate, Many, Too numerous to count   Yeast Wet Prep HPF POC neg    Bacteria Wet Prep HPF POC  Few   RBC Wet Prep HPF POC     WBC Wet Prep HPF POC     KOH Prep POC Negative Negative     Assessment/Plan: Vaginal discharge - Neg wet prep/exam. Reassurance. Discussed pros/cons of tx and to cont for now, and normal d/c changes. Try tx once wkly instead of twice. Add probiotics.  - Plan: POCT Wet Prep with KOH     Return if symptoms worsen or fail to improve.  Cordella Nyquist B. Raphaela Cannaday, PA-C 12/06/2016 10:52 AM

## 2016-12-15 ENCOUNTER — Encounter: Payer: Self-pay | Admitting: Family Medicine

## 2016-12-16 MED ORDER — TRIAMCINOLONE ACETONIDE 0.1 % EX CREA: 1.0000 "application " | TOPICAL_CREAM | Freq: Two times a day (BID) | CUTANEOUS | 0 refills | Status: DC

## 2016-12-26 ENCOUNTER — Ambulatory Visit: Payer: Medicaid Other | Admitting: Family Medicine

## 2016-12-27 ENCOUNTER — Ambulatory Visit (INDEPENDENT_AMBULATORY_CARE_PROVIDER_SITE_OTHER): Payer: Medicaid Other | Admitting: Family Medicine

## 2016-12-27 ENCOUNTER — Encounter: Payer: Self-pay | Admitting: Family Medicine

## 2016-12-27 VITALS — BP 116/74 | HR 99 | Temp 99.7°F | Resp 14 | Wt 229.2 lb

## 2016-12-27 DIAGNOSIS — B9689 Other specified bacterial agents as the cause of diseases classified elsewhere: Secondary | ICD-10-CM

## 2016-12-27 DIAGNOSIS — B86 Scabies: Secondary | ICD-10-CM

## 2016-12-27 DIAGNOSIS — N760 Acute vaginitis: Secondary | ICD-10-CM | POA: Diagnosis not present

## 2016-12-27 DIAGNOSIS — E1165 Type 2 diabetes mellitus with hyperglycemia: Secondary | ICD-10-CM

## 2016-12-27 MED ORDER — PERMETHRIN 5 % EX CREA: 1.0000 "application " | TOPICAL_CREAM | Freq: Once | CUTANEOUS | 0 refills | Status: AC

## 2016-12-27 NOTE — Progress Notes (Signed)
BP 116/74   Pulse 99   Temp 99.7 F (37.6 C) (Oral)   Resp 14   Wt 229 lb 3.2 oz (104 kg)   LMP 12/22/2016   SpO2 99%   BMI 33.85 kg/m    Subjective:    Patient ID: Penny Gonzalez, female    DOB: 10/23/1995, 21 y.o.   MRN: [id number]  HPI: Penny Gonzalez is a 21 y.o. female  Chief Complaint  Patient presents with  . Pruritis    weeks all over   HPI Patient is here c/o itching for weeks She was seen by my colleague, Maurice SmallEmily Boyce, FNP on June 19th for similar symptoms; she was prescribed vistaril I saw her back in May and she was given triamcinolone cream; that hasn't helped much Now other people in the house are itching New bumps in the groin area, on the hands and arms Nothing on the scalp really Started in the groin area after being intimate in [place]; he had bumps on his back  She went to urgent care two weeks ago; she had a really bad yeast infection; vaginally; white discharge, clumpy; [facility] Not using metrogel for now, taking a break for two weeks she was told Using nystatin powder under the breasts; has a good sports bra for support  [facility] 12/27/2016 10/14/2016 10/14/2016 09/23/2016 09/01/2016  Decreased Interest 0 0 0 0 0  Down, Depressed, Hopeless 0 1 - 1 0  PHQ - 2 Score 0 1 0 1 0  Altered sleeping - - - - -  Tired, decreased energy - - - - -  Change in appetite - - - - -  Feeling bad or failure about yourself  - - - - -  Trouble concentrating - - - - -  Moving slowly or fidgety/restless - - - - -  Suicidal thoughts - - - - -  PHQ-9 Score - - - - -  Difficult doing work/chores - - - Not difficult at all -   Relevant past medical, surgical, family and social history reviewed Past Medical History:  Diagnosis Date  . Anemia, iron deficiency, inadequate dietary intake 12/01/2014  . Bronchitis 04/2016   dr Sheretta Grumbine cleared pt medically for surgery after cxr results were normal (05-16-16)  . Cold 05/12/2016   RESOLVING PER PT  . Depression    patient has not been officially diagnosed but has symptoms  . Diabetes mellitus without complication (HCC)   . Essential hypertension, benign 03/31/2016  . Irregular periods/menstrual cycles   . Joint pain of leg   . Major depression 11/23/2015   Past Surgical History:  Procedure Laterality Date  . CHOLECYSTECTOMY N/A 05/19/2016   Procedure: LAPAROSCOPIC CHOLECYSTECTOMY WITH INTRAOPERATIVE CHOLANGIOGRAM;  Surgeon: Nadeen LandauJarvis Wilton Smith, MD;  Location: [facility] ORS;  Service: General;  Laterality: N/A;   Family History  Problem Relation Age of Onset  . Diabetes Mother   . Arthritis Father   . Diabetes Father   . Heart disease Father   . Hypertension Father   . Stroke Father   . Diabetes Maternal Grandmother   . Hypertension Maternal Grandmother   . Heart disease Paternal Grandmother   . Hypertension Paternal Grandmother   . Lupus Sister   . Deafness Sister    Social History   Social History  . Marital status: Single    Spouse name: N/A  . Number of children: 1  . Years of education: N/A   Occupational History  .  Not on file.   Social History Main Topics  . Smoking status: Never Smoker  . Smokeless tobacco: Never Used  . Alcohol use 0.0 oz/week     Comment: OCC  . Drug use: No  . Sexual activity: Yes    Partners: Male    Birth control/ protection: Condom, Patch   Other Topics Concern  . Not on file   Social History Narrative  . No narrative on file   Interim medical history since last visit reviewed. Allergies and medications reviewed  Review of Systems Per HPI unless specifically indicated above     Objective:    BP 116/74   Pulse 99   Temp 99.7 F (37.6 C) (Oral)   Resp 14   Wt 229 lb 3.2 oz (104 kg)   LMP 12/22/2016   SpO2 99%   BMI 33.85 kg/m   Wt Readings from Last 3 Encounters:  12/27/16 229 lb 3.2 oz (104 kg)  12/06/16 234 lb (106.1 kg)  11/29/16 234 lb 11.2 oz (106.5 kg)    Physical Exam  Constitutional:  She appears well-developed and well-nourished.  Cardiovascular: Normal rate.   Pulmonary/Chest: Effort normal.  Neurological: She is alert.  Skin:  Numerous tiny erythematous papules; freshest ones on the right wrist, volar left elbow; numerous bumps also between the legs left and right side; no vesicles; not dermatomal distribution  Psychiatric: Her mood appears not anxious. She does not exhibit a depressed mood.    Results for orders placed or performed in visit on 12/06/16  POCT Wet Prep with KOH  Result Value Ref Range   Trichomonas, UA Negative    Clue Cells Wet Prep HPF POC neg    Epithelial Wet Prep HPF POC  Few, Moderate, Many, Too numerous to count   Yeast Wet Prep HPF POC neg    Bacteria Wet Prep HPF POC  Few   RBC Wet Prep HPF POC     WBC Wet Prep HPF POC     KOH Prep POC Negative Negative      Assessment & Plan:   Problem List Items Addressed This Visit      Endocrine   Diabetes mellitus type 2, controlled (HCC) (Chronic)    Foot exam by MD today        Genitourinary   Bacterial vaginosis    Recurrent; managed primarily by gyn now on metrogel (though taking a break for 2 weeks per urgent care instructions)       Other Visit Diagnoses    Scabies    -  Primary   suspected scabies, though none seen on scraping; explained dx, treatment; see AVS; household members to be treated by their doctors       Follow up plan: No Follow-up on file.  An after-visit summary was printed and given to the patient at check-out.  Please see the patient instructions which may contain other information and recommendations beyond what is mentioned above in the assessment and plan.  Meds ordered this encounter  Medications  . permethrin (ELIMITE) 5 % cream    Sig: Apply 1 application topically once. Leave on 8-14 hours, then shower, rinse off completely; repeat 1 week later if needed    Dispense:  60 g    Refill:  0    No orders of the defined types were placed in this  encounter.

## 2016-12-27 NOTE — Assessment & Plan Note (Signed)
Foot exam by MD today 

## 2016-12-27 NOTE — Assessment & Plan Note (Signed)
Recurrent; managed primarily by gyn now on metrogel (though taking a break for 2 weeks per urgent care instructions)

## 2016-12-27 NOTE — Patient Instructions (Addendum)
Use the new cream tonight Apply from jawline down, including creases (but not up in vagina); leave on for 8-14 hours, then shower and rinse off thoroughly Repeat in one week if needed If after that 2nd treatment, you are still having problems, call me and we'll refer you to a dermatologist   Scabies, Adult Scabies is a skin condition that happens when very small insects get under the skin (infestation). This causes a rash and severe itchiness. Scabies can spread from person to person (is contagious). If you get scabies, it is common for others in your household to get scabies too. With proper treatment, symptoms usually go away in 2-4 weeks. Scabies usually does not cause lasting problems. What are the causes? This condition is caused by mites (Sarcoptes scabiei, or human itch mites) that can only be seen with a microscope. The mites get into the top layer of skin and lay eggs. Scabies can spread from person to person through:  Close contact with a person who has scabies.  Contact with infested items, such as towels, bedding, or clothing.  What increases the risk? This condition is more likely to develop in:  People who live in nursing homes and other extended-care facilities.  People who have sexual contact with a partner who has scabies.  Young children who attend child care facilities.  People who care for others who are at increased risk for scabies.  What are the signs or symptoms? Symptoms of this condition may include:  Severe itchiness. This is often worse at night.  A rash that includes tiny red bumps or blisters. The rash commonly occurs on the wrist, elbow, armpit, fingers, waist, groin, or buttocks. Bumps may form a line (burrow) in some areas.  Skin irritation. This can include scaly patches or sores.  How is this diagnosed? This condition is diagnosed with a physical exam. Your health care provider will look closely at your skin. In some cases, your health care  provider may take a sample of your affected skin (skin scraping) and have it examined under a microscope. How is this treated? This condition may be treated with:  Medicated cream or lotion that kills the mites. This is spread on the entire body and left on for several hours. Usually, one treatment with medicated cream or lotion is enough to kill all of the mites. In severe cases, the treatment may be repeated.  Medicated cream that relieves itching.  Medicines that help to relieve itching.  Medicines that kill the mites. This treatment is rarely used.  Follow these instructions at home:  Medicines  Take or apply over-the-counter and prescription medicines as told by your health care provider.  Apply medicated cream or lotion as told by your health care provider.  Do not wash off the medicated cream or lotion until the necessary amount of time has passed. Skin Care  Avoid scratching your affected skin.  Keep your fingernails closely trimmed to reduce injury from scratching.  Take cool baths or apply cool washcloths to help reduce itching. General instructions  Clean all items that you recently had contact with, including bedding, clothing, and furniture. Do this on the same day that your treatment starts. ? Use hot water when you wash items. ? Place unwashable items into closed, airtight plastic bags for at least 3 days. The mites cannot live for more than 3 days away from human skin. ? Vacuum furniture and mattresses that you use.  Make sure that other people who may have been  infested are examined by a health care provider. These include members of your household and anyone who may have had contact with infested items.  Keep all follow-up visits as told by your health care provider. This is important. Contact a health care provider if:  You have itching that does not go away after 4 weeks of treatment.  You continue to develop new bumps or burrows.  You have redness,  swelling, or pain in your rash area after treatment.  You have fluid, blood, or pus coming from your rash. This information is not intended to replace advice given to you by your health care provider. Make sure you discuss any questions you have with your health care provider. Document Released: 02/18/2015 Document Revised: 11/05/2015 Document Reviewed: 12/30/2014 Elsevier Interactive Patient Education  [employer].

## 2016-12-30 ENCOUNTER — Encounter: Payer: Self-pay | Admitting: Emergency Medicine

## 2016-12-30 ENCOUNTER — Telehealth: Payer: Self-pay | Admitting: Family Medicine

## 2016-12-30 ENCOUNTER — Emergency Department
Admission: EM | Admit: 2016-12-30 | Discharge: 2016-12-31 | Disposition: A | Discharge status: Home / Self Care | Payer: Medicaid Other | Attending: Emergency Medicine | Admitting: Emergency Medicine

## 2016-12-30 DIAGNOSIS — R21 Rash and other nonspecific skin eruption: Secondary | ICD-10-CM | POA: Diagnosis present

## 2016-12-30 DIAGNOSIS — E119 Type 2 diabetes mellitus without complications: Secondary | ICD-10-CM | POA: Diagnosis not present

## 2016-12-30 DIAGNOSIS — I10 Essential (primary) hypertension: Secondary | ICD-10-CM | POA: Insufficient documentation

## 2016-12-30 DIAGNOSIS — Z7984 Long term (current) use of oral hypoglycemic drugs: Secondary | ICD-10-CM | POA: Diagnosis not present

## 2016-12-30 DIAGNOSIS — L309 Dermatitis, unspecified: Secondary | ICD-10-CM

## 2016-12-30 MED ORDER — METHYLPREDNISOLONE SODIUM SUCC 125 MG IJ SOLR
125.0000 mg | Freq: Once | INTRAMUSCULAR | Status: AC
  Administered 2016-12-30: 125 mg via INTRAMUSCULAR
  Filled 2016-12-30: qty 2

## 2016-12-30 MED ORDER — PREDNISONE 50 MG PO TABS: ORAL_TABLET | ORAL | 0 refills | Status: DC

## 2016-12-30 NOTE — Telephone Encounter (Signed)
Pt was told that if her situation did not improve that Dr Sherie DonLada would send in a referral to a dermatologist. Pt asking if this can still be done. Please return call to discuss

## 2016-12-30 NOTE — Telephone Encounter (Signed)
Please thank her for letting me know; I just put in the referral to dermatology

## 2016-12-30 NOTE — Assessment & Plan Note (Signed)
Persistent, refer to derm

## 2016-12-30 NOTE — ED Notes (Signed)
Pt reports itching for weeks, pt states started under breasts and was given powder which helped. Pt states she has seen her PCP 3 times. Pt states the itching continued everywhere else and was given a cream without relief. Pt states 3rd time she saw PCP she was tested for scabies which came back negative however was still given the cream just in case. Pt states she used the cream and is continuing to itch all over including, bilateral arms, hands, back, inner thighs and abd. Pt points to little raised bumps on arms and states "everywhere I itch I get a bump." Pt denies drainage from bumps. Pt reports hx of DM.

## 2016-12-30 NOTE — ED Notes (Signed)
Patient to stat desk ambulatory without difficulty or distress.  Reports here for itching "for awhile"  Also reports has seen PMD x 2 for same.  Also reports while at PMD office was running low grade fever.

## 2016-12-30 NOTE — ED Triage Notes (Signed)
Pt is ambulatory to ED with c/o rash on inner thighs. Pt reports that her doctor's office recommended a dermatologist and have ruled out scabies and other possible insect bites. Pt is in NAD at this time.

## 2017-01-02 NOTE — Telephone Encounter (Signed)
Done

## 2017-01-04 NOTE — ED Provider Notes (Signed)
[facility] Emergency Department Provider Note  ____________________________________________  Time seen: Approximately 3:23 PM  I have reviewed the triage vital signs and the nursing notes.   HISTORY  Chief Complaint Rash    HPI Penny AmosSamantha B Gonzalez is a 21 y.o. female presenting to the emergency department with pruritic rash along the inner thighs, bilateral arms and back. Patient states that she has been previously treated for scabies by primary care and rash has been refractory to permethrin. Rash is not localized in intertriginous spaces. She denies facial swelling, shortness of breath, chest tightness, nausea, vomiting and abdominal pain. Patient denies new contacts with linens, laundry soap, clothing or outdoor foliage. Patient has no known contacts with scabies.   Past Medical History:  Diagnosis Date  . Anemia, iron deficiency, inadequate dietary intake 12/01/2014  . Bronchitis 04/2016   dr lada cleared pt medically for surgery after cxr results were normal (05-16-16)  . Cold 05/12/2016   RESOLVING PER PT  . Depression    patient has not been officially diagnosed but has symptoms  . Diabetes mellitus without complication (HCC)   . Essential hypertension, benign 03/31/2016  . Irregular periods/menstrual cycles   . Joint pain of leg   . Major depression 11/23/2015    Patient Active Problem List   Diagnosis Date Noted  . Dermatitis 12/30/2016  . Bacterial vaginosis 10/14/2016  . Allergic rhinitis 09/23/2016  . Fever blister 08/21/2016  . Abdominal pain, RLQ 07/11/2016  . Fatty liver 05/07/2016  . Foot pain, bilateral 03/31/2016  . Essential hypertension, benign 03/31/2016  . Proteinuria 01/30/2016  . Major depression 11/23/2015  . Vitamin D deficiency 11/19/2015  . Fatigue 11/18/2015  . Preventative health care 10/27/2015  . Medication monitoring encounter 10/27/2015  . Constipation, slow transit 02/18/2015  . Tonsil stone 02/18/2015  .  Diabetes mellitus type 2, controlled (HCC) 11/26/2014  . Headache disorder 11/26/2014  . Arthralgia of multiple joints 11/26/2014  . Morbid obesity (HCC) 11/26/2014  . Contact with and suspected exposure to infections with predominantly sexual mode of transmission 11/26/2014    Past Surgical History:  Procedure Laterality Date  . CHOLECYSTECTOMY N/A 05/19/2016   Procedure: LAPAROSCOPIC CHOLECYSTECTOMY WITH INTRAOPERATIVE CHOLANGIOGRAM;  Surgeon: Nadeen LandauJarvis Wilton Smith, MD;  Location: [facility] ORS;  Service: General;  Laterality: N/A;    Prior to Admission medications   Medication Sig Start Date End Date Taking? Authorizing Provider  hydrOXYzine (VISTARIL) 25 MG capsule Take 1 capsule (25 mg total) by mouth every 8 (eight) hours as needed for itching. 11/29/16   Doren CustardBoyce, Emily E, FNP  metFORMIN (GLUCOPHAGE-XR) 500 MG 24 hr tablet Take 1 tablet (500 mg total) by mouth daily. 10/14/16   Kerman PasseyLada, Melinda P, MD  metroNIDAZOLE (METROGEL) 0.75 % vaginal gel Place 1 Applicatorful vaginally 2 (two) times a week. Patient not taking: Reported on 12/27/2016 10/13/16 03/28/17  Conard NovakJackson, Stephen D, MD  norelgestromin-ethinyl estradiol Burr Medico(XULANE) 150-35 MCG/24HR transdermal patch Place 1 patch onto the skin once a week. With one week off, then repeat 11/06/16   Kerman PasseyLada, Melinda P, MD  nystatin (MYCOSTATIN/NYSTOP) powder Apply topically 3 (three) times daily. Patient not taking: Reported on 12/27/2016 09/23/16   Kerman PasseyLada, Melinda P, MD  predniSONE (DELTASONE) 50 MG tablet Take one tablet by mouth daily for the next five days. 12/30/16   Orvil FeilWoods, Jaclyn M, PA-C  triamcinolone cream (KENALOG) 0.1 % Apply 1 application topically 2 (two) times daily. 12/16/16   Kerman PasseyLada, Melinda P, MD    Allergies Patient has no known  allergies.  Family History  Problem Relation Age of Onset  . Diabetes Mother   . Arthritis Father   . Diabetes Father   . Heart disease Father   . Hypertension Father   . Stroke Father   . Diabetes Maternal Grandmother   .  Hypertension Maternal Grandmother   . Heart disease Paternal Grandmother   . Hypertension Paternal Grandmother   . Lupus Sister   . Deafness Sister     Social History Social History  Substance Use Topics  . Smoking status: Never Smoker  . Smokeless tobacco: Never Used  . Alcohol use 0.0 oz/week     Comment: OCC     Review of Systems  Constitutional: No fever/chills Eyes: No visual changes. No discharge ENT: No upper respiratory complaints. Cardiovascular: no chest pain. Respiratory: no cough. No SOB. Gastrointestinal: No abdominal pain.  No nausea, no vomiting.  No diarrhea.  No constipation. Musculoskeletal: Negative for musculoskeletal pain. Skin: She has diffuse pruritic rash Neurological: Negative for headaches, focal weakness or numbness.   ____________________________________________   PHYSICAL EXAM:  VITAL SIGNS: ED Triage Vitals  Enc Vitals Group     BP 12/30/16 2151 131/79     Pulse Rate 12/30/16 2151 85     Resp 12/30/16 2151 18     Temp 12/30/16 2151 99 F (37.2 C)     Temp Source 12/30/16 2151 Oral     SpO2 12/30/16 2151 97 %     Weight 12/30/16 2152 229 lb (103.9 kg)     Height 12/30/16 2152 5\' 9"  (1.753 m)     Head Circumference --      Peak Flow --      Pain Score 12/30/16 2150 10     Pain Loc --      Pain Edu? --      Excl. in GC? --      Constitutional: Alert and oriented. Well appearing and in no acute distress. Eyes: Conjunctivae are normal. PERRL. EOMI. Head: Atraumatic. Cardiovascular: Normal rate, regular rhythm. Normal S1 and S2.  Good peripheral circulation. Respiratory: Normal respiratory effort without tachypnea or retractions. Lungs CTAB. Good air entry to the bases with no decreased or absent breath sounds. Gastrointestinal: Bowel sounds 4 quadrants. Soft and nontender to palpation. No guarding or rigidity. No palpable masses. No distention. No CVA tenderness. Musculoskeletal: Full range of motion to all extremities. No gross  deformities appreciated. Neurologic:  Normal speech and language. No gross focal neurologic deficits are appreciated.  Skin: She has diffuse maculopapular rash along the inner thighs, upper extremities and back. No burrows visualized. No crusting or surrounding cellulitis. Psychiatric: Mood and affect are normal. Speech and behavior are normal. Patient exhibits appropriate insight and judgement.   ____________________________________________   LABS (all labs ordered are listed, but only abnormal results are displayed)  Labs Reviewed - No data to display ____________________________________________  EKG   ____________________________________________  RADIOLOGY   No results found.  ____________________________________________    PROCEDURES  Procedure(s) performed:    Procedures    Medications  methylPREDNISolone sodium succinate (SOLU-MEDROL) 125 mg/2 mL injection 125 mg (125 mg Intramuscular Given 12/30/16 2344)     ____________________________________________   INITIAL IMPRESSION / ASSESSMENT AND PLAN / ED COURSE  Pertinent labs & imaging results that were available during my care of the patient were reviewed by me and considered in my medical decision making (see chart for details).  Review of the [place] CSRS was performed in accordance of the [facility] prior to  dispensing any controlled drugs.    Assessment and plan Rash Patient presents to the emergency department with diffuse, pruritic rash refractory to permethrin. Patient was given Solu-Medrol in the emergency department and discharged with prednisone. Patient was advised to follow up with her primary care provider if rash persists. Overall physical exam is reassuring. Vital signs are reassuring at discharge. All patient questions were answered.   ____________________________________________  FINAL CLINICAL IMPRESSION(S) / ED DIAGNOSES  Final diagnoses:  Rash      NEW MEDICATIONS STARTED DURING THIS  VISIT:  Discharge Medication List as of 12/30/2016 11:28 PM    START taking these medications   Details  predniSONE (DELTASONE) 50 MG tablet Take one tablet by mouth daily for the next five days., Print            This chart was dictated using voice recognition software/Dragon. Despite best efforts to proofread, errors can occur which can change the meaning. Any change was purely unintentional.    Orvil Feil, PA-C 01/04/17 1616    Jeanmarie Plant, MD 01/05/17 1102

## 2017-01-09 ENCOUNTER — Telehealth: Payer: Self-pay

## 2017-01-09 ENCOUNTER — Encounter: Payer: Self-pay | Admitting: Family Medicine

## 2017-01-09 DIAGNOSIS — L299 Pruritus, unspecified: Secondary | ICD-10-CM

## 2017-01-09 MED ORDER — HYDROXYZINE PAMOATE 25 MG PO CAPS: 25.0000 mg | ORAL_CAPSULE | Freq: Four times a day (QID) | ORAL | 0 refills | Status: DC | PRN

## 2017-01-09 NOTE — Telephone Encounter (Signed)
Patient went to ER this weekend and was given prednisone, which has helped.  They also have set her up for derm appt in September.  Patient wants to know if she can get another round of prednisone?

## 2017-01-09 NOTE — Telephone Encounter (Signed)
MyChart message sent Rx sent

## 2017-01-13 ENCOUNTER — Ambulatory Visit (INDEPENDENT_AMBULATORY_CARE_PROVIDER_SITE_OTHER): Payer: Medicaid Other | Admitting: Family Medicine

## 2017-01-13 ENCOUNTER — Encounter: Payer: Self-pay | Admitting: Family Medicine

## 2017-01-13 ENCOUNTER — Other Ambulatory Visit: Payer: Self-pay

## 2017-01-13 VITALS — BP 122/84 | HR 100 | Temp 98.5°F | Resp 16 | Ht 69.0 in | Wt 231.4 lb

## 2017-01-13 DIAGNOSIS — L253 Unspecified contact dermatitis due to other chemical products: Secondary | ICD-10-CM

## 2017-01-13 DIAGNOSIS — N898 Other specified noninflammatory disorders of vagina: Secondary | ICD-10-CM

## 2017-01-13 DIAGNOSIS — L509 Urticaria, unspecified: Secondary | ICD-10-CM

## 2017-01-13 DIAGNOSIS — E1165 Type 2 diabetes mellitus with hyperglycemia: Secondary | ICD-10-CM

## 2017-01-13 DIAGNOSIS — Z113 Encounter for screening for infections with a predominantly sexual mode of transmission: Secondary | ICD-10-CM

## 2017-01-13 DIAGNOSIS — R5383 Other fatigue: Secondary | ICD-10-CM

## 2017-01-13 DIAGNOSIS — R7989 Other specified abnormal findings of blood chemistry: Secondary | ICD-10-CM

## 2017-01-13 DIAGNOSIS — Z7251 High risk heterosexual behavior: Secondary | ICD-10-CM

## 2017-01-13 MED ORDER — CETIRIZINE HCL 10 MG PO TABS: 10.0000 mg | ORAL_TABLET | Freq: Every day | ORAL | 0 refills | Status: DC

## 2017-01-13 MED ORDER — DIPHENHYDRAMINE HCL 25 MG PO TABS: 25.0000 mg | ORAL_TABLET | Freq: Every evening | ORAL | 0 refills | Status: DC | PRN

## 2017-01-13 MED ORDER — RANITIDINE HCL 150 MG/10ML PO SYRP: 150.0000 mg | ORAL_SOLUTION | Freq: Every day | ORAL | 0 refills | Status: DC

## 2017-01-13 NOTE — Patient Instructions (Addendum)
Latex Allergy Latex is a fluid made by certain trees. It used to make many rubber products, including gloves, rubber bands, balloons, condoms, adhesive tape, and adhesive bandages. A latex allergy is an abnormal reaction to latex by the body's defense system (immune system). If you have a latex allergy, your risk of developing allergies to certain foods increases. These foods include avocados, bananas, chestnuts, kiwis, and passion fruits. What are the causes? A latex allergy happens when the immune system mistakenly sees latex as harmful and releases antibodies to fight it. What increases the risk? You are at increased risk of developing a latex allergy if you:  Work in the health care or Warehouse manager.  Have more than one medical procedure.  What are the signs or symptoms? Symptoms are triggered by touching an item that was made with latex or breathing in particles of latex. Symptoms may be mild or severe. Mild symptoms  Nasal congestion.  Tingling in the mouth.  An itchy, red rash.  Vomiting.  Diarrhea.  Watery eyes.  Coughing. Severe symptoms  Swelling of the lips, face, and tongue.  Swelling of the back of the mouth and throat.  Wheezing.  A hoarse voice.  Itchy, red, swollen areas of skin (hives).  Dizziness or light-headedness.  Fainting.  Trouble breathing or swallowing.  Chest tightness.  Rapid heartbeat. How is this diagnosed? A diagnosis is made with:  A physical exam.  A medical and family history.  Skin or blood tests.  How is this treated? There is no cure for a latex allergy. An allergic reaction can be treated with medicines, such as:  Antihistamines.  Steroids.  Respiratory inhalers.  Epinephrine.  Severe symptoms can be a sign of a life-threatening reaction called anaphylaxis, and they require immediate treatment. Severe reactions usually need to be treated at a hospital. People who have had a severe reaction may be prescribed  rescue medicines to take if they are accidentally exposed to latex. Follow these instructions at home: General instructions  Avoid exposure to latex.  Use latex-free alternatives.  Be careful of products labeled "hypoallergenic." "Hypoallergenic" does not mean "latex-free."  Take medicines only as directed by your health care provider.  Inform all health care providers that you have a latex allergy. Instructions for People with a Severe Latex Allergy  Wear a medical alert bracelet or necklace that describes your allergy.  Carry your anaphylaxis kit or an epinephrine injection with you at all times. Use them as directed by your health care provider.  Make sure that you, your family members, and your employer know: ? How to use an anaphylaxis kit. ? How to give an epinephrine injection.  Replace your epinephrine immediately after use in case you have another reaction.  Seek medical care even after you take epinephrine. This is important because epinephrine can be followed by a delayed, life-threatening reaction. Contact a health care provider if:  Your symptoms have not gone away within 2 days.  Your symptoms get worse.  You develop new symptoms. Get help right away if:  You have an allergic reaction and use the medicine epinephrine.  You have any symptoms of a severe reaction, such as: ? Swelling of the lips, face, and tongue. ? Swelling of the back of the mouth and throat. ? Wheezing. ? A hoarse voice. ? Hives. ? Dizziness or light-headedness. ? Fainting. ? Trouble breathing, speaking, or swallowing. ? Chest tightness ? Rapid heartbeat. This information is not intended to replace advice given to you by  your health care provider. Make sure you discuss any questions you have with your health care provider. Document Released: 03/05/2004 Document Revised: 11/05/2015 Document Reviewed: 03/11/2014 Elsevier Interactive Patient Education  2018 [employer]  Sexually Transmitted Infections, Adult Sexually transmitted infections (STIs) are diseases that are passed (transmitted) from person to person through bodily fluids exchanged during sex or sexual contact. Bodily fluids include saliva, semen, blood, vaginal mucus, and urine. You may have an increased risk for developing an STI if you have unprotected oral, vaginal, or anal sex. Some common STIs include:  Herpes.  Hepatitis B.  Chlamydia.  Gonorrhea.  Syphilis.  HPV (human papillomavirus).  HIV (humanimmunodeficiency virus), the virus that can cause AIDS (acquired immunodeficiency virus).  How can I protect myself from sexually transmitted infections? The only way to completely prevent STIs is not to have sex of any kind (practice abstinence). This includes oral, vaginal, or anal sex. If you are sexually active, take these actions to lower your risk of getting an STI:  Have only one sex partner (be monogamous) or limit the number of sexual partners you have.  Stay up-to-date on immunizations. Certain vaccines can lower your risk of getting certain STIs, such as: ? Hepatitis A and B vaccines. You may have been vaccinated as a young child, but likely need a booster shot as a teen or young adult. ? HPV vaccine. This vaccine is recommended if you are a man under age 45 or a woman under age 104.  Use methods that prevent the exchange of body fluids between partners (barrier protection) every time you have sex. Barrier protection can be used during oral, vaginal, or anal sex. Commonly used barrier methods include: ? Female condom. ? Female condom. ? Dental dam.  Get tested regularly for STIs. Have your sexual partner get tested regularly as well.  Avoid mixing alcohol, drugs, and sex. Alcohol and drug use can affect your ability to make good decisions and can lead to risky sexual behaviors.  Ask your health care provider about taking pre-exposure prophylaxis (PrEP) to prevent HIV infection  if you: ? Have a HIV-positive sexual partner. ? Have multiple sexual partners or partners who do not know their HIV status, and do not regularly use a condom during sex. ? Use injection drugs and share needles.  Birth control pills, injections, implants, and intrauterine devices (IUDs) do not protect against STIs. To prevent both STIs and pregnancy, always use a condom with another form of birth control. Some STIs, such as herpes, are spread through skin to skin contact. A condom does not protect you from getting such STIs. If you or your partner have herpes and there is an active flare with open sores, avoid all sexual contact. Why are these changes important? Taking steps to practice safe sex protects you and others. Many STIs can be cured. However, some STIs are not curable and will affect you for the rest of your life. STIs can be passed on to another person even if you do not have symptoms. What can happen if changes are not made? Certain STIs may:  Require you to take medicine for the rest of your life.  Affect your ability to have children (your fertility).  Increase your risk for developing another STI or certain serious health conditions, such as: ? Cervical cancer. ? Head and neck cancer. ? Pelvic inflammatory disease (PID) in women. ? Organ damage or damage to other parts of your body, if the infection spreads.  Be passed  to a baby during childbirth.  How are sexually transmitted infections treated? If you or your partner know or think that you may have an STI:  Talk with your healthcare provider about what can be done to treat it. Some STIs can be treated and cured with medicines.  For curable STIs, you and your partner should avoid sex during treatment and for several days after treatment is complete.  You and your partner should both be treated at the same time, if there is any chance that your partner is infected as well. If you get treatment but your partner does not,  your partner can re-infect you when you resume sexual contact.  Do not have unprotected sex.  Where to find more information: Learn more about sexually transmitted diseases and infections from:  Centers for Disease Control and Prevention: ? More information about specific STIs: AppraiserFraud.fi ? Find places to get sexual health counseling and treatment for free or for a low cost: gettested.StoreMirror.com.cy  [place] Department of Health and Human Services: http://white.info/.html  Summary  The only way to completely prevent STIs is not to have sex (practice abstinence), including oral, vaginal, or anal sex.  STIs can spread through saliva, semen, blood, vaginal mucus, urine, or sexual contact.  If you do have sex, limit your number of sexual partners and use a barrier protection method every time you have sex.  If you develop an STI, get treated right away and ask your partner to be treated as well. Do not resume having sex until both of you have completed treatment for the STI. This information is not intended to replace advice given to you by your health care provider. Make sure you discuss any questions you have with your health care provider. Document Released: 05/26/2016 Document Revised: 05/26/2016 Document Reviewed: 05/26/2016 Elsevier Interactive Patient Education  [employer].

## 2017-01-13 NOTE — Progress Notes (Addendum)
Name: Penny Gonzalez   MRN: [id number]    DOB: [birth year]   Date:01/13/2017       Progress Note  Subjective  Chief Complaint  Chief Complaint  Patient presents with  . Exposure to STD    want blood work , bacteria vaginosis  . Allergic Reaction    to latex     HPI  Latex Allergy: Patient has realized that she has a latex allergy - she has had repeated vaginal irritation and rash to inner thighs after using latex condoms. 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  She still has rash/"welts" to inner thigh and pruritis.  Used Hydroxyzine without relief of symptoms.  Vaginal irritation and Discharge: Saw an Urgent Care Provider in early July, was told she had a yeast infection and was treated for this; she was also told to stop using flagyl gel for 2-3 weeks.  She stopped its use, but is concerned that she may have BV or a yeast infection again because she is having mild vaginal itching and small amount of white discharge.  STD Check: Has been using condoms.  Has one partner who she is concerned may have passed HSV virus to her - she has not noted any vaginal ulcerations or lesions; has had a cold sore in the past. Has one other partner who she has occasional contact with.  We discussed her repetitive STD testing at length. Patient became tearful during discussion and explained that her child's father had transmitted an STI to her in the past without notifying her in a timely manner. She has been very anxious since this incident 1 year ago that she has an STI but no symptoms.  She has been working with a Social worker regarding her anxiety and trust issues, including this issue, and plans to continue counseling.  Reassurance is provided. Encouraged patient to always use a condom (now, non-latex) and to follow up with our clinic as needed.  Patient Active Problem List   Diagnosis Date Noted  . Dermatitis  12/30/2016  . Bacterial vaginosis 10/14/2016  . Allergic rhinitis 09/23/2016  . Fever blister 08/21/2016  . Abdominal pain, RLQ 07/11/2016  . Fatty liver 05/07/2016  . Foot pain, bilateral 03/31/2016  . Essential hypertension, benign 03/31/2016  . Proteinuria 01/30/2016  . Major depression 11/23/2015  . Vitamin D deficiency 11/19/2015  . Fatigue 11/18/2015  . Preventative health care 10/27/2015  . Medication monitoring encounter 10/27/2015  . Constipation, slow transit 02/18/2015  . Tonsil stone 02/18/2015  . Diabetes mellitus type 2, controlled ([place]) 11/26/2014  . Headache disorder 11/26/2014  . Arthralgia of multiple joints 11/26/2014  . Morbid obesity ([place]) 11/26/2014  . Contact with and suspected exposure to infections with predominantly sexual mode of transmission 11/26/2014    Social History  Substance Use Topics  . Smoking status: Never Smoker  . Smokeless tobacco: Never Used  . Alcohol use 0.0 oz/week     Comment: OCC     Current Outpatient Prescriptions:  .  metFORMIN (GLUCOPHAGE-XR) 500 MG 24 hr tablet, Take 1 tablet (500 mg total) by mouth daily., Disp: 30 tablet, Rfl: 5 .  norelgestromin-ethinyl estradiol Marilu Favre) 150-35 MCG/24HR transdermal patch, Place 1 patch onto the skin once a week. With one week off, then repeat, Disp: 9 patch, Rfl: 1 .  cetirizine (ZYRTEC) 10 MG tablet, Take 1 tablet (10 mg total) by mouth daily., Disp: 30 tablet,  Rfl: 0 .  diphenhydrAMINE (BENADRYL) 25 MG tablet, Take 1 tablet (25 mg total) by mouth at bedtime as needed., Disp: 30 tablet, Rfl: 0 .  ranitidine (ZANTAC) 150 MG/10ML syrup, Take 10 mLs (150 mg total) by mouth at bedtime., Disp: 300 mL, Rfl: 0  No Known Allergies  ROS  Constitutional: Negative for fever or weight change.  Respiratory: Negative for cough and shortness of breath.   Cardiovascular: Negative for chest pain or palpitations.  Gastrointestinal: Negative for abdominal pain, no bowel changes.  GU: See  HPI Musculoskeletal: Negative for gait problem or joint swelling.  Skin: Positive for rash and itching to bilateral inner thighs, some itching to bilateral hands  Neurological: Negative for dizziness or headache.  No other specific complaints in a complete review of systems (except as listed in HPI above).  Objective  Vitals:   01/13/17 1435  BP: 122/84  Pulse: 100  Resp: 16  Temp: 98.5 F (36.9 C)  TempSrc: Oral  SpO2: 98%  Weight: 231 lb 6.4 oz (105 kg)  Height: '5\' 9"'$  (1.753 m)   Body mass index is 34.17 kg/m.  Nursing Note and Vital Signs reviewed.  Physical Exam  Constitutional: Patient appears well-developed and well-nourished. Obese  No distress.  HEENT: head atraumatic, normocephalic Cardiovascular: Normal rate, regular rhythm, S1/S2 present.  No murmur or rub heard. No BLE edema. Pulmonary/Chest: Effort normal and breath sounds clear. No respiratory distress or retractions. Abdominal: Soft and non-tender, bowel sounds present x4 quadrants. Psychiatric: Patient has a normal mood and affect. behavior is normal. Judgment and thought content normal FEMALE GENITALIA:  External genitalia normal External urethra normal Vaginal vault normal with moderate amount of white, thin discharge; no lesions Cervix normal with moderate amount of white, thin discharge or lesions Bimanual exam normal without masses, no CMT. Musculoskeletal: Normal range of motion, no joint effusions. No gross deformities Neurological: he is alert and oriented to person, place, and time. No cranial nerve deficit. Coordination, balance, strength, speech and gait are normal.  Skin: Skin is warm and dry. Urticaria noted to bilateral upper medial thighs. No drainage or bleeding, no excoriation, no tenderness on palpation. Psychiatric: Patient has a normal mood and affect. behavior is normal. Judgment and thought content normal.  Recent Results (from the past 2160 hour(s))  POCT Wet Prep with KOH      Status: Normal   Collection Time: 10/27/16  2:28 PM  Result Value Ref Range   Trichomonas, UA Negative    Clue Cells Wet Prep HPF POC neg    Epithelial Wet Prep HPF POC  Few, Moderate, Many, Too numerous to count   Yeast Wet Prep HPF POC neg    Bacteria Wet Prep HPF POC  Few   RBC Wet Prep HPF POC     WBC Wet Prep HPF POC     KOH Prep POC Negative Negative  POCT urine pregnancy     Status: Normal   Collection Time: 10/27/16  2:37 PM  Result Value Ref Range   Preg Test, Ur Negative Negative  Chlamydia/Gonococcus/Trichomonas, NAA     Status: None   Collection Time: 10/27/16  3:39 PM  Result Value Ref Range   Chlamydia by NAA Negative Negative   Gonococcus by NAA Negative Negative   Trich vag by NAA Negative Negative  GC/Chlamydia Probe Amp     Status: None   Collection Time: 11/03/16  9:59 AM  Result Value Ref Range   CT Probe RNA NOT DETECTED  Comment:                    **Normal Reference Range: NOT DETECTED**   This test was performed using the APTIMA COMBO2 Assay ([place]   The analytical performance characteristics of this assay, when used to test SurePath specimens have been determined by [facility]      GC Probe RNA NOT DETECTED     Comment:                    **Normal Reference Range: NOT DETECTED**   This test was performed using the APTIMA COMBO2 Assay ([place].).   The analytical performance characteristics of this assay, when used to test SurePath specimens have been determined by [facility]     Hepatitis panel, acute     Status: None   Collection Time: 11/03/16 10:11 AM  Result Value Ref Range   Hepatitis B Surface Ag NEGATIVE NEGATIVE   HCV Ab NEGATIVE NEGATIVE   Hep B C IgM NON REACTIVE NON REACTIVE    Comment: High levels of Hepatitis B Core IgM antibody are detectable during the acute stage of Hepatitis B. This antibody is used to differentiate current from past HBV infection.      Hep A IgM NON REACTIVE NON  REACTIVE    Comment:   Effective April 28, 2014, Hepatitis Acute Panel (test code [phone]) will be revised to automatically reflex to the Hepatitis C Viral RNA, Quantitative, Real-Time PCR assay if the Hepatitis C antibody screening result is Reactive. This action is being taken to ensure that the CDC/USPSTF recommended HCV diagnostic algorithm with the appropriate test reflex needed for accurate interpretation is followed.     HIV antibody     Status: None   Collection Time: 11/03/16 10:11 AM  Result Value Ref Range   HIV 1&2 Ab, 4th Generation NONREACTIVE NONREACTIVE    Comment:   HIV-1 antigen and HIV-1/HIV-2 antibodies were not detected.  There is no laboratory evidence of HIV infection.   HIV-1/2 Antibody Diff        Not indicated. HIV-1 RNA, Qual TMA          Not indicated.     PLEASE NOTE: This information has been disclosed to you from records whose confidentiality may be protected by state law. If your state requires such protection, then the state law prohibits you from making any further disclosure of the information without the specific written consent of the person to whom it pertains, or as otherwise permitted by law. A general authorization for the release of medical or other information is NOT sufficient for this purpose.   The performance of this assay has not been clinically validated in patients less than 80 years old.   For additional information please refer to http://education.[facility].com/faq/FAQ106.  (This link is being provided for informational/educational purposes only.)     RPR     Status: None   Collection Time: 11/03/16 10:11 AM  Result Value Ref Range   RPR Ser Ql NON REAC NON REAC  Lipase, blood     Status: None   Collection Time: 11/20/16  8:27 PM  Result Value Ref Range   Lipase 21 11 - 51 U/L  Comprehensive metabolic panel     Status: Abnormal   Collection Time: 11/20/16  8:27 PM  Result Value Ref Range   Sodium 136 135 -  145 mmol/L   Potassium 3.5 3.5 - 5.1 mmol/L   Chloride 102 101 -  111 mmol/L   CO2 27 22 - 32 mmol/L   Glucose, Bld 114 (H) 65 - 99 mg/dL   BUN 6 6 - 20 mg/dL   Creatinine, Ser 0.70 0.44 - 1.00 mg/dL   Calcium 8.9 8.9 - 10.3 mg/dL   Total Protein 7.3 6.5 - 8.1 g/dL   Albumin 4.0 3.5 - 5.0 g/dL   AST 31 15 - 41 U/L   ALT 30 14 - 54 U/L   Alkaline Phosphatase 42 38 - 126 U/L   Total Bilirubin 0.6 0.3 - 1.2 mg/dL   GFR calc non Af Amer >60 >60 mL/min   GFR calc Af Amer >60 >60 mL/min    Comment: (NOTE) The eGFR has been calculated using the CKD EPI equation. This calculation has not been validated in all clinical situations. eGFR's persistently <60 mL/min signify possible Chronic Kidney Disease.    Anion gap 7 5 - 15  CBC     Status: None   Collection Time: 11/20/16  8:27 PM  Result Value Ref Range   WBC 5.1 3.6 - 11.0 K/uL   RBC 4.70 3.80 - 5.20 MIL/uL   Hemoglobin 13.5 12.0 - 16.0 g/dL   HCT 40.1 35.0 - 47.0 %   MCV 85.3 80.0 - 100.0 fL   MCH 28.7 26.0 - 34.0 pg   MCHC 33.7 32.0 - 36.0 g/dL   RDW 13.8 11.5 - 14.5 %   Platelets 227 150 - 440 K/uL  Urinalysis, Complete w Microscopic     Status: Abnormal   Collection Time: 11/20/16  8:27 PM  Result Value Ref Range   Color, Urine YELLOW (A) YELLOW   APPearance CLEAR (A) CLEAR   Specific Gravity, Urine 1.014 1.005 - 1.030   pH 7.0 5.0 - 8.0   Glucose, UA NEGATIVE NEGATIVE mg/dL   Hgb urine dipstick LARGE (A) NEGATIVE   Bilirubin Urine NEGATIVE NEGATIVE   Ketones, ur NEGATIVE NEGATIVE mg/dL   Protein, ur NEGATIVE NEGATIVE mg/dL   Nitrite NEGATIVE NEGATIVE   Leukocytes, UA NEGATIVE NEGATIVE   RBC / HPF TOO NUMEROUS TO COUNT 0 - 5 RBC/hpf   WBC, UA 0-5 0 - 5 WBC/hpf   Bacteria, UA NONE SEEN NONE SEEN   Squamous Epithelial / LPF 0-5 (A) NONE SEEN   Mucous PRESENT   Pregnancy, urine POC     Status: None   Collection Time: 11/20/16  8:37 PM  Result Value Ref Range   Preg Test, Ur NEGATIVE NEGATIVE    Comment:        THE  SENSITIVITY OF THIS METHODOLOGY IS >24 mIU/mL   SureSwab, T.vaginalis RNA,Ql,Female     Status: None   Collection Time: 11/29/16 10:55 AM  Result Value Ref Range   Trichomonas vaginalis RNA Not Detected Not Detected    Comment:   This test was performed using the APTIMA(R) Trichomonas vaginalis Assay (GEN-PROBE(R)).   For more information on this test, go to: http://education.[facility].com/faq/Trichomonastma   GC/Chlamydia Probe Amp     Status: None   Collection Time: 11/29/16 11:05 AM  Result Value Ref Range   CT Probe RNA NOT DETECTED     Comment:                    **Normal Reference Range: NOT DETECTED**   This test was performed using the APTIMA COMBO2 Assay ([employer]   The analytical performance characteristics of this assay, when used to test SurePath specimens have been determined by [employer]  GC Probe RNA NOT DETECTED     Comment:                    **Normal Reference Range: NOT DETECTED**   This test was performed using the APTIMA COMBO2 Assay ([employer]   The analytical performance characteristics of this assay, when used to test SurePath specimens have been determined by [facility]     POCT Wet Prep with KOH     Status: Normal   Collection Time: 12/06/16 10:52 AM  Result Value Ref Range   Trichomonas, UA Negative    Clue Cells Wet Prep HPF POC neg    Epithelial Wet Prep HPF POC  Few, Moderate, Many, Too numerous to count   Yeast Wet Prep HPF POC neg    Bacteria Wet Prep HPF POC  Few   RBC Wet Prep HPF POC     WBC Wet Prep HPF POC     KOH Prep POC Negative Negative     Assessment & Plan  1. High risk sexual behavior  - RPR - WET PREP BY MOLECULAR PROBE - HSV 1/2 Ab (IgM), IFA w/rflx Titer - GC/Chlamydia Probe Amp  2. Vaginal irritation  - WET PREP BY MOLECULAR PROBE  3. Screening examination for STD (sexually transmitted disease)  - RPR - WET PREP BY MOLECULAR PROBE - HSV 1/2 Ab (IgM), IFA  w/rflx Titer - GC/Chlamydia Probe Amp  4. Urticaria  - ranitidine (ZANTAC) 150 MG/10ML syrup; Take 10 mLs (150 mg total) by mouth at bedtime.  Dispense: 300 mL; Refill: 0 - diphenhydrAMINE (BENADRYL) 25 MG tablet; Take 1 tablet (25 mg total) by mouth at bedtime as needed.  Dispense: 30 tablet; Refill: 0 - cetirizine (ZYRTEC) 10 MG tablet; Take 1 tablet (10 mg total) by mouth daily.  Dispense: 30 tablet; Refill: 0  5. Latex allergy, contact dermatitis  - discussed using non-latex condoms; patient already using non-latex gloves at home. Latex allergy added to patient's allergies.  - We will refrain from proactive treatment for yeast/bv/STI today until results are returned due to patient's lack of concerning findings and recurring/recent testing and treatments. -Red flags and when to present for emergency care or RTC including fever >101.88F, chest pain, shortness of breath, throat/tongue/lip swelling, worsening of the rash, pelvic or abdominal pain, NVD, or new/worsening/un-resolving symptoms, reviewed with patient at time of visit. Follow up and care instructions discussed and provided in AVS.  I have reviewed this encounter including the documentation in this note and/or discussed this patient with the Johney Maine, FNP, NP-C. I am certifying that I agree with the content of this note as supervising physician.  Steele Sizer, MD [facility] 01/15/2017, 4:06 PM

## 2017-01-14 LAB — RPR

## 2017-01-14 LAB — WET PREP BY MOLECULAR PROBE
Candida species: NOT DETECTED
GARDNERELLA VAGINALIS: DETECTED — AB
TRICHOMONAS VAG: NOT DETECTED

## 2017-01-14 LAB — LIPID PANEL
Cholesterol: 148 mg/dL (ref <200)
HDL: 82 mg/dL (ref >50)
LDL CALC: 45 mg/dL (ref <100)
TRIGLYCERIDES: 106 mg/dL (ref <150)
Total CHOL/HDL Ratio: 1.8 Ratio (ref <5.0)
VLDL: 21 mg/dL (ref <30)

## 2017-01-14 LAB — VITAMIN D 25 HYDROXY (VIT D DEFICIENCY, FRACTURES): Vit D, 25-Hydroxy: 22 ng/mL — ABNORMAL LOW (ref 30–100)

## 2017-01-14 LAB — GC/CHLAMYDIA PROBE AMP
CT PROBE, AMP APTIMA: NOT DETECTED
GC Probe RNA: NOT DETECTED

## 2017-01-17 ENCOUNTER — Encounter: Payer: Self-pay | Admitting: Family Medicine

## 2017-01-17 ENCOUNTER — Other Ambulatory Visit: Payer: Self-pay | Admitting: Family Medicine

## 2017-01-17 MED ORDER — VITAMIN D (ERGOCALCIFEROL) 1.25 MG (50000 UNIT) PO CAPS: 50000.0000 [IU] | ORAL_CAPSULE | ORAL | 1 refills | Status: DC

## 2017-01-17 NOTE — Progress Notes (Signed)
Start vit D every 2 weeks for a few months

## 2017-01-19 ENCOUNTER — Encounter: Payer: Self-pay | Admitting: Family Medicine

## 2017-01-19 LAB — HSV 1/2 AB (IGM), IFA W/RFLX TITER
HSV 1 IgM Screen: NEGATIVE
HSV 2 IGM SCREEN: NEGATIVE

## 2017-01-19 NOTE — Telephone Encounter (Signed)
Spoke with patient via telephone - we will not prescribe medication for BV and will defer to gynecology because patient was seen by GYN yesterday. Pt in agreement.

## 2017-02-15 ENCOUNTER — Telehealth: Payer: Self-pay | Admitting: Family Medicine

## 2017-02-15 NOTE — Telephone Encounter (Signed)
States her bp reading today 130/100 please advise

## 2017-02-15 NOTE — Telephone Encounter (Signed)
Patient states will have it recheck at local pharmacy if still high, I encouraged an appt.

## 2017-02-22 ENCOUNTER — Encounter: Payer: Self-pay | Admitting: Family Medicine

## 2017-02-22 ENCOUNTER — Ambulatory Visit (INDEPENDENT_AMBULATORY_CARE_PROVIDER_SITE_OTHER): Payer: Medicaid Other | Admitting: Family Medicine

## 2017-02-22 VITALS — BP 128/80 | HR 98 | Temp 98.6°F | Resp 14 | Wt 232.7 lb

## 2017-02-22 DIAGNOSIS — E1165 Type 2 diabetes mellitus with hyperglycemia: Secondary | ICD-10-CM | POA: Diagnosis not present

## 2017-02-22 DIAGNOSIS — R42 Dizziness and giddiness: Secondary | ICD-10-CM | POA: Diagnosis not present

## 2017-02-22 DIAGNOSIS — R030 Elevated blood-pressure reading, without diagnosis of hypertension: Secondary | ICD-10-CM | POA: Diagnosis not present

## 2017-02-22 NOTE — Progress Notes (Addendum)
Name: Penny Gonzalez   MRN: [id number]    DOB: [birth year]   Date:02/22/2017       Progress Note  Subjective  Chief Complaint  Chief Complaint  Patient presents with  . Hypertension    last time she was at gyn it was high.  She called And was told to check again at pharmacy which was still high    HPI  Patient presents for concern for elevated BP - was 132/100 at her OB/GYN office on 02/14/2017, she rechecked at [facility] yesterday 148/104.  Both readings were in the afternoon, and she is in our office in the morning today with two BP readings in the 120/80 range.  Her mother has a cuff at home but she says it is not working currently.  She has had more headaches than usual in the last 2 weeks - mostly behind her left eye, pain is described as throbbing and lasts a couple of minutes.  Usually occurs when at work - works at [employer].  She says work is her "stressful place" and she often has headaches there, also experiences some lightheadedness while there.  No chest pain or shortness of breath, no NVD. Eats a lot of shrimp, stewed chicken, doesn't cook with a lot of salt - mainly black pepper.   Family history: Mother, father, both grandmothers have HTN; unsure about grandfathers' history; she has 7 siblings - one brother has known HTN, she is unsure about the other siblings.  - Personal History: Has DM - She stopped taking Metformin because she feels like her blood sugar drops and she doesn't feel well - last A1C was May 2018 and was 6.4%.  Has significant anxiety, but does not take medications; does go to therapy.  Has prior HTN dx related to pregnancy - not taking medication.  Patient has been seeing OB/GYN for [facility] care - she took her patch off for less than 24 hours last Wednesday, then put a new patch on; she has had spotting and vaginal bleeding since last Monday (about 9 days).  She asks for my assistance today.  There is a clear line of communication with Dr. Jolyn Nap office  (GYN) that is documented and these notes are reviewed. Explained process to patient and that Dr. Jolyn Nap office must handle this issue going forward.  Patient Active Problem List   Diagnosis Date Noted  . Latex allergy, contact dermatitis 01/13/2017  . Dermatitis 12/30/2016  . Bacterial vaginosis 10/14/2016  . Allergic rhinitis 09/23/2016  . Fever blister 08/21/2016  . Abdominal pain, RLQ 07/11/2016  . Fatty liver 05/07/2016  . Foot pain, bilateral 03/31/2016  . Essential hypertension, benign 03/31/2016  . Proteinuria 01/30/2016  . Major depression 11/23/2015  . Vitamin D deficiency 11/19/2015  . Fatigue 11/18/2015  . Preventative health care 10/27/2015  . Medication monitoring encounter 10/27/2015  . Constipation, slow transit 02/18/2015  . Tonsil stone 02/18/2015  . Diabetes mellitus type 2, controlled (HCC) 11/26/2014  . Headache disorder 11/26/2014  . Arthralgia of multiple joints 11/26/2014  . Morbid obesity (HCC) 11/26/2014  . Contact with and suspected exposure to infections with predominantly sexual mode of transmission 11/26/2014    Social History  Substance Use Topics  . Smoking status: Never Smoker  . Smokeless tobacco: Never Used  . Alcohol use 0.0 oz/week     Comment: OCC     Current Outpatient Prescriptions:  .  cetirizine (ZYRTEC) 10 MG tablet, Take 1 tablet (10 mg total) by mouth daily., Disp:  30 tablet, Rfl: 0 .  diphenhydrAMINE (BENADRYL) 25 MG tablet, Take 1 tablet (25 mg total) by mouth at bedtime as needed., Disp: 30 tablet, Rfl: 0 .  norelgestromin-ethinyl estradiol Burr [facility]ico) 150-35 MCG/24HR transdermal patch, Place 1 patch onto the skin once a week. With one week off, then repeat, Disp: 9 patch, Rfl: 1 .  metFORMIN (GLUCOPHAGE-XR) 500 MG 24 hr tablet, Take 1 tablet (500 mg total) by mouth daily. (Patient not taking: Reported on 02/22/2017), Disp: 30 tablet, Rfl: 5 .  ranitidine (ZANTAC) 150 MG/10ML syrup, Take 10 mLs (150 mg total) by mouth at bedtime.  (Patient not taking: Reported on 02/22/2017), Disp: 300 mL, Rfl: 0 .  Vitamin D, Ergocalciferol, (DRISDOL) 50000 units CAPS capsule, Take 1 capsule (50,000 Units total) by mouth every 14 (fourteen) days. (Patient not taking: Reported on 02/22/2017), Disp: 2 capsule, Rfl: 1  Allergies  Allergen Reactions  . Latex Rash    Pruritic, urticaria    ROS  Ten systems reviewed and is negative except as mentioned in HPI  Objective  Vitals:   02/22/17 0949  BP: 128/80  Pulse: 98  Resp: 14  Temp: 98.6 F (37 C)  TempSrc: Oral  SpO2: 97%  Weight: 232 lb 11.2 oz (105.6 kg)   Body mass index is 34.36 kg/m.  Nursing Note and Vital Signs reviewed.  Physical Exam  Constitutional: Patient appears well-developed and well-nourished. Obese No distress.  HEENT: head atraumatic, normocephalic, pupils equal and reactive to light, EOM's intact,  oropharynx pink and moist without exudate Cardiovascular: Normal rate, regular rhythm, S1/S2 present.  No murmur or rub heard. No BLE edema. Pulmonary/Chest: Effort normal and breath sounds clear. No respiratory distress or retractions. Abdominal: Soft and non-tender, bowel sounds present x4 quadrants. Psychiatric: Patient has a normal mood and affect. behavior is normal. Judgment and thought content normal.  Recent Results (from the past 2160 hour(s))  SureSwab, T.vaginalis RNA,Ql,Female     Status: None   Collection Time: 11/29/16 10:55 AM  Result Value Ref Range   Trichomonas vaginalis RNA Not Detected Not Detected    Comment:   This test was performed using the APTIMA(R) Trichomonas vaginalis Assay (GEN-PROBE(R)).   For more information on this test, go to: http://education.[facility].com/faq/Trichomonastma   GC/Chlamydia Probe Amp     Status: None   Collection Time: 11/29/16 11:05 AM  Result Value Ref Range   CT Probe RNA NOT DETECTED     Comment:                    **Normal Reference Range: NOT DETECTED**   This test was performed  using the APTIMA COMBO2 Assay ([employer] Inc.).   The analytical performance characteristics of this assay, when used to test SurePath specimens have been determined by [facility]      GC Probe RNA NOT DETECTED     Comment:                    **Normal Reference Range: NOT DETECTED**   This test was performed using the APTIMA COMBO2 Assay ([employer].).   The analytical performance characteristics of this assay, when used to test SurePath specimens have been determined by [facility]     POCT Wet Prep with KOH     Status: Normal   Collection Time: 12/06/16 10:52 AM  Result Value Ref Range   Trichomonas, UA Negative    Clue Cells Wet Prep HPF POC neg    Epithelial Wet Prep HPF POC  Few, Moderate, Many, Too numerous to count   Yeast Wet Prep HPF POC neg    Bacteria Wet Prep HPF POC  Few   RBC Wet Prep HPF POC     WBC Wet Prep HPF POC     KOH Prep POC Negative Negative  Lipid panel     Status: None   Collection Time: 01/13/17 10:33 AM  Result Value Ref Range   Cholesterol 148 <200 mg/dL   Triglycerides 295 <621 mg/dL   HDL 82 >30 mg/dL   Total CHOL/HDL Ratio 1.8 <5.0 Ratio   VLDL 21 <30 mg/dL   LDL Cholesterol 45 <865 mg/dL  Vitamin D (25 hydroxy)     Status: Abnormal   Collection Time: 01/13/17 10:33 AM  Result Value Ref Range   Vit D, 25-Hydroxy 22 (L) 30 - 100 ng/mL    Comment: Vitamin D Status           25-OH Vitamin D        Deficiency                <20 ng/mL        Insufficiency         20 - 29 ng/mL        Optimal             > or = 30 ng/mL   For 25-OH Vitamin D testing on patients on D2-supplementation and patients for whom quantitation of D2 and D3 fractions is required, the Quest[facility] 25-OH VIT D, (D2,D3), LC/MS/MS is recommended: order code 78469 (patients > 2 yrs).   WET PREP BY MOLECULAR PROBE     Status: Abnormal   Collection Time: 01/13/17  3:17 PM  Result Value Ref Range   Candida species NOT DETECTED NOT DETECTED   Trichomonas  vaginosis NOT DETECTED NOT DETECTED   Gardnerella vaginalis DETECTED (A) NOT DETECTED    Comment: Increased levels of G. vaginalis may not be significant in the absence of signs and symptoms of bacterial vaginosis.   GC/Chlamydia Probe Amp     Status: None   Collection Time: 01/13/17  3:33 PM  Result Value Ref Range   CT Probe RNA NOT DETECTED     Comment:                    **Normal Reference Range: NOT DETECTED**   This test was performed using the APTIMA COMBO2 Assay ([employer]   The analytical performance characteristics of this assay, when used to test SurePath specimens have been determined by [facility]      GC Probe RNA NOT DETECTED     Comment:                    **Normal Reference Range: NOT DETECTED**   This test was performed using the APTIMA COMBO2 Assay ([employer]   The analytical performance characteristics of this assay, when used to test SurePath specimens have been determined by [facility]     RPR     Status: None   Collection Time: 01/13/17  3:38 PM  Result Value Ref Range   RPR Ser Ql NON REAC NON REAC  HSV 1/2 Ab (IgM), IFA w/rflx Titer     Status: None   Collection Time: 01/13/17  3:38 PM  Result Value Ref Range   HSV 1 IgM Screen NEGATIVE    HSV 2 IgM Screen NEGATIVE     Comment: REFERENCE RANGE: NEGATIVE   The  IFA procedure for measuring IgM antibodies to HSV 1 and HSV 2 detects both type-common and type- specific HSV antibodies. Thus, IgM reactivity to both HSV 1 and HSV 2 may represent crossreactive HSV antibodies rather than exposure to both HSV 1 and HSV 2.   This test was developed and its analytical performance characteristics have been determined by [facility]. It has not been cleared or approved by FDA. This assay has been validated pursuant to the CLIA regulations and is used for clinical purposes.      Assessment & Plan  1. Elevated blood pressure reading - COMPLETE  METABOLIC PANEL WITH GFR - TSH  2. Controlled type 2 diabetes mellitus with hyperglycemia, without long-term current use of insulin (HCC) - COMPLETE METABOLIC PANEL WITH GFR - Hemoglobin A1c  3. Intermittent lightheadedness - COMPLETE METABOLIC PANEL WITH GFR - CBC - TSH - Hemoglobin A1c - Drink plenty of fluids, eat 3 healthy meals a day with 2 health snacks in between to maintain BG levels and avoid peaks and troughs.  Return for BP check and DM follow up with PCP Dr. Sherie DonLada.   I have reviewed this encounter including the documentation in this note and/or discussed this patient with the Deboraha Sprangprovider,Reba Hulett, FNP, NP-C. I am certifying that I agree with the content of this note as supervising physician.  Alba CoryKrichna Sowles, MD [facility] 02/25/2017, 8:23 PM

## 2017-02-22 NOTE — Patient Instructions (Addendum)
DASH Eating Plan DASH stands for "Dietary Approaches to Stop Hypertension." The DASH eating plan is a healthy eating plan that has been shown to reduce high blood pressure (hypertension). It may also reduce your risk for type 2 diabetes, heart disease, and stroke. The DASH eating plan may also help with weight loss. What are tips for following this plan? General guidelines  Avoid eating more than 2,300 mg (milligrams) of salt (sodium) a day. If you have hypertension, you may need to reduce your sodium intake to 1,500 mg a day.  Limit alcohol intake to no more than 1 drink a day for nonpregnant women and 2 drinks a day for men. One drink equals 12 oz of beer, 5 oz of wine, or 1 oz of hard liquor.  Work with your health care provider to maintain a healthy body weight or to lose weight. Ask what an ideal weight is for you.  Get at least 30 minutes of exercise that causes your heart to beat faster (aerobic exercise) most days of the week. Activities may include walking, swimming, or biking.  Work with your health care provider or diet and nutrition specialist (dietitian) to adjust your eating plan to your individual calorie needs. Reading food labels  Check food labels for the amount of sodium per serving. Choose foods with less than 5 percent of the Daily Value of sodium. Generally, foods with less than 300 mg of sodium per serving fit into this eating plan.  To find whole grains, look for the word "whole" as the first word in the ingredient list. Shopping  Buy products labeled as "low-sodium" or "no salt added."  Buy fresh foods. Avoid canned foods and premade or frozen meals. Cooking  Avoid adding salt when cooking. Use salt-free seasonings or herbs instead of table salt or sea salt. Check with your health care provider or pharmacist before using salt substitutes.  Do not fry foods. Cook foods using healthy methods such as baking, boiling, grilling, and broiling instead.  Cook with  heart-healthy oils, such as olive, canola, soybean, or sunflower oil. Meal planning   Eat a balanced diet that includes: ? 5 or more servings of fruits and vegetables each day. At each meal, try to fill half of your plate with fruits and vegetables. ? Up to 6-8 servings of whole grains each day. ? Less than 6 oz of lean meat, poultry, or fish each day. A 3-oz serving of meat is about the same size as a deck of cards. One egg equals 1 oz. ? 2 servings of low-fat dairy each day. ? A serving of nuts, seeds, or beans 5 times each week. ? Heart-healthy fats. Healthy fats called Omega-3 fatty acids are found in foods such as flaxseeds and coldwater fish, like sardines, salmon, and mackerel.  Limit how much you eat of the following: ? Canned or prepackaged foods. ? Food that is high in trans fat, such as fried foods. ? Food that is high in saturated fat, such as fatty meat. ? Sweets, desserts, sugary drinks, and other foods with added sugar. ? Full-fat dairy products.  Do not salt foods before eating.  Try to eat at least 2 vegetarian meals each week.  Eat more home-cooked food and less restaurant, buffet, and fast food.  When eating at a restaurant, ask that your food be prepared with less salt or no salt, if possible. What foods are recommended? The items listed may not be a complete list. Talk with your dietitian about what   dietary choices are best for you. Grains Whole-grain or whole-wheat bread. Whole-grain or whole-wheat pasta. Brown rice. Oatmeal. Quinoa. Bulgur. Whole-grain and low-sodium cereals. Pita bread. Low-fat, low-sodium crackers. Whole-wheat flour tortillas. Vegetables Fresh or frozen vegetables (raw, steamed, roasted, or grilled). Low-sodium or reduced-sodium tomato and vegetable juice. Low-sodium or reduced-sodium tomato sauce and tomato paste. Low-sodium or reduced-sodium canned vegetables. Fruits All fresh, dried, or frozen fruit. Canned fruit in natural juice (without  added sugar). Meat and other protein foods Skinless chicken or turkey. Ground chicken or turkey. Pork with fat trimmed off. Fish and seafood. Egg whites. Dried beans, peas, or lentils. Unsalted nuts, nut butters, and seeds. Unsalted canned beans. Lean cuts of beef with fat trimmed off. Low-sodium, lean deli meat. Dairy Low-fat (1%) or fat-free (skim) milk. Fat-free, low-fat, or reduced-fat cheeses. Nonfat, low-sodium ricotta or cottage cheese. Low-fat or nonfat yogurt. Low-fat, low-sodium cheese. Fats and oils Soft margarine without trans fats. Vegetable oil. Low-fat, reduced-fat, or light mayonnaise and salad dressings (reduced-sodium). Canola, safflower, olive, soybean, and sunflower oils. Avocado. Seasoning and other foods Herbs. Spices. Seasoning mixes without salt. Unsalted popcorn and pretzels. Fat-free sweets. What foods are not recommended? The items listed may not be a complete list. Talk with your dietitian about what dietary choices are best for you. Grains Baked goods made with fat, such as croissants, muffins, or some breads. Dry pasta or rice meal packs. Vegetables Creamed or fried vegetables. Vegetables in a cheese sauce. Regular canned vegetables (not low-sodium or reduced-sodium). Regular canned tomato sauce and paste (not low-sodium or reduced-sodium). Regular tomato and vegetable juice (not low-sodium or reduced-sodium). Pickles. Olives. Fruits Canned fruit in a light or heavy syrup. Fried fruit. Fruit in cream or butter sauce. Meat and other protein foods Fatty cuts of meat. Ribs. Fried meat. Bacon. Sausage. Bologna and other processed lunch meats. Salami. Fatback. Hotdogs. Bratwurst. Salted nuts and seeds. Canned beans with added salt. Canned or smoked fish. Whole eggs or egg yolks. Chicken or turkey with skin. Dairy Whole or 2% milk, cream, and half-and-half. Whole or full-fat cream cheese. Whole-fat or sweetened yogurt. Full-fat cheese. Nondairy creamers. Whipped toppings.  Processed cheese and cheese spreads. Fats and oils Butter. Stick margarine. Lard. Shortening. Ghee. Bacon fat. Tropical oils, such as coconut, palm kernel, or palm oil. Seasoning and other foods Salted popcorn and pretzels. Onion salt, garlic salt, seasoned salt, table salt, and sea salt. Worcestershire sauce. Tartar sauce. Barbecue sauce. Teriyaki sauce. Soy sauce, including reduced-sodium. Steak sauce. Canned and packaged gravies. Fish sauce. Oyster sauce. Cocktail sauce. Horseradish that you find on the shelf. Ketchup. Mustard. Meat flavorings and tenderizers. Bouillon cubes. Hot sauce and Tabasco sauce. Premade or packaged marinades. Premade or packaged taco seasonings. Relishes. Regular salad dressings. Where to find more information:  National Heart, Lung, and Blood Institute: www.nhlbi.nih.gov  American Heart Association: www.heart.org Summary  The DASH eating plan is a healthy eating plan that has been shown to reduce high blood pressure (hypertension). It may also reduce your risk for type 2 diabetes, heart disease, and stroke.  With the DASH eating plan, you should limit salt (sodium) intake to 2,300 mg a day. If you have hypertension, you may need to reduce your sodium intake to 1,500 mg a day.  When on the DASH eating plan, aim to eat more fresh fruits and vegetables, whole grains, lean proteins, low-fat dairy, and heart-healthy fats.  Work with your health care provider or diet and nutrition specialist (dietitian) to adjust your eating plan to your individual   calorie needs. This information is not intended to replace advice given to you by your health care provider. Make sure you discuss any questions you have with your health care provider. Document Released: 05/19/2011 Document Revised: 05/23/2016 Document Reviewed: 05/23/2016 Elsevier Interactive Patient Education  2017 Elsevier Inc. Diabetes Mellitus and Food It is important for you to manage your blood sugar (glucose) level.  Your blood glucose level can be greatly affected by what you eat. Eating healthier foods in the appropriate amounts throughout the day at about the same time each day will help you control your blood glucose level. It can also help slow or prevent worsening of your diabetes mellitus. Healthy eating may even help you improve the level of your blood pressure and reach or maintain a healthy weight. General recommendations for healthful eating and cooking habits include:  Eating meals and snacks regularly. Avoid going long periods of time without eating to lose weight.  Eating a diet that consists mainly of plant-based foods, such as fruits, vegetables, nuts, legumes, and whole grains.  Using low-heat cooking methods, such as baking, instead of high-heat cooking methods, such as deep frying.  Work with your dietitian to make sure you understand how to use the Nutrition Facts information on food labels. How can food affect me? Carbohydrates Carbohydrates affect your blood glucose level more than any other type of food. Your dietitian will help you determine how many carbohydrates to eat at each meal and teach you how to count carbohydrates. Counting carbohydrates is important to keep your blood glucose at a healthy level, especially if you are using insulin or taking certain medicines for diabetes mellitus. Alcohol Alcohol can cause sudden decreases in blood glucose (hypoglycemia), especially if you use insulin or take certain medicines for diabetes mellitus. Hypoglycemia can be a life-threatening condition. Symptoms of hypoglycemia (sleepiness, dizziness, and disorientation) are similar to symptoms of having too much alcohol. If your health care provider has given you approval to drink alcohol, do so in moderation and use the following guidelines:  Women should not have more than one drink per day, and men should not have more than two drinks per day. One drink is equal to: ? 12 oz of beer. ? 5 oz of  wine. ? 1 oz of hard liquor.  Do not drink on an empty stomach.  Keep yourself hydrated. Have water, diet soda, or unsweetened iced tea.  Regular soda, juice, and other mixers might contain a lot of carbohydrates and should be counted.  What foods are not recommended? As you make food choices, it is important to remember that all foods are not the same. Some foods have fewer nutrients per serving than other foods, even though they might have the same number of calories or carbohydrates. It is difficult to get your body what it needs when you eat foods with fewer nutrients. Examples of foods that you should avoid that are high in calories and carbohydrates but low in nutrients include:  Trans fats (most processed foods list trans fats on the Nutrition Facts label).  Regular soda.  Juice.  Candy.  Sweets, such as cake, pie, doughnuts, and cookies.  Fried foods.  What foods can I eat? Eat nutrient-rich foods, which will nourish your body and keep you healthy. The food you should eat also will depend on several factors, including:  The calories you need.  The medicines you take.  Your weight.  Your blood glucose level.  Your blood pressure level.  Your cholesterol level.    You should eat a variety of foods, including:  Protein. ? Lean cuts of meat. ? Proteins low in saturated fats, such as fish, egg whites, and beans. Avoid processed meats.  Fruits and vegetables. ? Fruits and vegetables that may help control blood glucose levels, such as apples, mangoes, and yams.  Dairy products. ? Choose fat-free or low-fat dairy products, such as milk, yogurt, and cheese.  Grains, bread, pasta, and rice. ? Choose whole grain products, such as multigrain bread, whole oats, and brown rice. These foods may help control blood pressure.  Fats. ? Foods containing healthful fats, such as nuts, avocado, olive oil, canola oil, and fish.  Does everyone with diabetes mellitus have the  same meal plan? Because every person with diabetes mellitus is different, there is not one meal plan that works for everyone. It is very important that you meet with a dietitian who will help you create a meal plan that is just right for you. This information is not intended to replace advice given to you by your health care provider. Make sure you discuss any questions you have with your health care provider. Document Released: 02/24/2005 Document Revised: 11/05/2015 Document Reviewed: 04/26/2013 Elsevier Interactive Patient Education  2017 Elsevier Inc.  

## 2017-02-23 LAB — COMPLETE METABOLIC PANEL WITH GFR
AG RATIO: 1.6 (calc) (ref 1.0–2.5)
ALBUMIN MSPROF: 4.1 g/dL (ref 3.6–5.1)
ALKALINE PHOSPHATASE (APISO): 41 U/L (ref 33–115)
ALT: 10 U/L (ref 6–29)
AST: 12 U/L (ref 10–30)
BILIRUBIN TOTAL: 0.3 mg/dL (ref 0.2–1.2)
BUN: 9 mg/dL (ref 7–25)
CHLORIDE: 105 mmol/L (ref 98–110)
CO2: 26 mmol/L (ref 20–32)
Calcium: 9.2 mg/dL (ref 8.6–10.2)
Creat: 0.73 mg/dL (ref 0.50–1.10)
GFR, EST AFRICAN AMERICAN: 135 mL/min/{1.73_m2} (ref >=60)
GFR, Est Non African American: 116 mL/min/{1.73_m2} (ref >=60)
GLUCOSE: 120 mg/dL (ref 65–139)
Globulin: 2.5 g/dL (calc) (ref 1.9–3.7)
Potassium: 4 mmol/L (ref 3.5–5.3)
Sodium: 140 mmol/L (ref 135–146)
TOTAL PROTEIN: 6.6 g/dL (ref 6.1–8.1)

## 2017-02-23 LAB — CBC
HEMATOCRIT: 36.4 % (ref 35.0–45.0)
Hemoglobin: 12.2 g/dL (ref 11.7–15.5)
MCH: 28.3 pg (ref 27.0–33.0)
MCHC: 33.5 g/dL (ref 32.0–36.0)
MCV: 84.5 fL (ref 80.0–100.0)
MPV: 11.5 fL (ref 7.5–12.5)
PLATELETS: 256 10*3/uL (ref 140–400)
RBC: 4.31 10*6/uL (ref 3.80–5.10)
RDW: 13.4 % (ref 11.0–15.0)
WBC: 5.3 10*3/uL (ref 3.8–10.8)

## 2017-02-23 LAB — HEMOGLOBIN A1C
Hgb A1c MFr Bld: 6.5 % of total Hgb — ABNORMAL HIGH (ref <5.7)
Mean Plasma Glucose: 140 (calc)
eAG (mmol/L): 7.7 (calc)

## 2017-02-23 LAB — TSH: TSH: 0.47 mIU/L

## 2017-03-14 ENCOUNTER — Ambulatory Visit: Payer: Medicaid Other | Admitting: Family Medicine

## 2017-03-21 ENCOUNTER — Ambulatory Visit (INDEPENDENT_AMBULATORY_CARE_PROVIDER_SITE_OTHER): Payer: Medicaid Other | Admitting: Family Medicine

## 2017-03-21 ENCOUNTER — Encounter: Payer: Self-pay | Admitting: Family Medicine

## 2017-03-21 DIAGNOSIS — K760 Fatty (change of) liver, not elsewhere classified: Secondary | ICD-10-CM

## 2017-03-21 DIAGNOSIS — E669 Obesity, unspecified: Secondary | ICD-10-CM | POA: Diagnosis not present

## 2017-03-21 DIAGNOSIS — I10 Essential (primary) hypertension: Secondary | ICD-10-CM | POA: Diagnosis not present

## 2017-03-21 DIAGNOSIS — N760 Acute vaginitis: Secondary | ICD-10-CM | POA: Diagnosis not present

## 2017-03-21 DIAGNOSIS — B9689 Other specified bacterial agents as the cause of diseases classified elsewhere: Secondary | ICD-10-CM

## 2017-03-21 DIAGNOSIS — E119 Type 2 diabetes mellitus without complications: Secondary | ICD-10-CM

## 2017-03-21 NOTE — Assessment & Plan Note (Signed)
Working with GYN to reduce episodes

## 2017-03-21 NOTE — Patient Instructions (Signed)
Please contact Dr. Elesa Massed about your request for the pregnancy test I'll completely defer that to your OB-GYN

## 2017-03-21 NOTE — Assessment & Plan Note (Addendum)
Recent liver enzymes reviewed; encouraged weight loss and low fat diet

## 2017-03-21 NOTE — Assessment & Plan Note (Addendum)
Recent A1c reviewed; foot exam by MD was normal; encouraged weight loss

## 2017-03-21 NOTE — Assessment & Plan Note (Signed)
Controlled right now 

## 2017-03-21 NOTE — Assessment & Plan Note (Signed)
Praise given; keep working on that; if she keeps losing weight, will hopefully be able to get off of more medicine

## 2017-03-21 NOTE — Progress Notes (Signed)
BP 122/78   Pulse 97   Temp 98.4 F (36.9 C) (Oral)   Resp 16   Wt 229 lb 6.4 oz (104.1 kg)   LMP 03/21/2017   SpO2 97%   BMI 33.88 kg/m    Subjective:    Patient ID: Penny Gonzalez, female    DOB: [birth year], 21 y.o.   MRN: [id number]  HPI: Penny Gonzalez is a 21 y.o. female  Chief Complaint  Patient presents with  . Follow-up  . Diabetes  . Medication Refill    HPI Type 2 diabetes; going on since age 42 or 41 Lab Results  Component Value Date   HGBA1C 6.5 (H) 02/22/2017  last eye exam was this year; no complications from diabetes Not checking sugars at home On metformin, but hasn't taken any for a month or so if that Speech about medicines and toddlers, risk of overdose, keep away from children  Going off of patch tomorrow; she is still bleeding; GYN put her on provera; taking now five days, gave her ten days total; then she'll stop the whole pill and take half of a pill for four days; she is going to take off her patch tomorrow and then start OCP to help her stop bleeding; hopes to have a period once every 3 months  Patient wants to take a pregnancy test; cannot say she has been bleeding, but it's more like real red blood, not brown blood; just spotting; not sure if internal bleeding; Dr. Elesa Massed is her GYN  Fatty liver; daughter loves sausage; patient rarely eats breakfast  Depression screen [facility] 2/9 03/21/2017 12/27/2016 10/14/2016 10/14/2016 09/23/2016  Decreased Interest 0 0 0 0 0  Down, Depressed, Hopeless 0 0 1 - 1  PHQ - 2 Score 0 0 1 0 1  Altered sleeping - - - - -  Tired, decreased energy - - - - -  Change in appetite - - - - -  Feeling bad or failure about yourself  - - - - -  Trouble concentrating - - - - -  Moving slowly or fidgety/restless - - - - -  Suicidal thoughts - - - - -  PHQ-9 Score - - - - -  Difficult doing work/chores - - - - Not difficult at all    Relevant past medical, surgical, family and social history reviewed Past Medical  History:  Diagnosis Date  . Anemia, iron deficiency, inadequate dietary intake 12/01/2014  . Bronchitis 04/2016   dr Tayte Mcwherter cleared pt medically for surgery after cxr results were normal (05-16-16)  . Cold 05/12/2016   RESOLVING PER PT  . Depression    patient has not been officially diagnosed but has symptoms  . Diabetes mellitus without complication (HCC)   . Essential hypertension, benign 03/31/2016  . Irregular periods/menstrual cycles   . Joint pain of leg   . Major depression 11/23/2015   Past Surgical History:  Procedure Laterality Date  . CHOLECYSTECTOMY N/A 05/19/2016   Procedure: LAPAROSCOPIC CHOLECYSTECTOMY WITH INTRAOPERATIVE CHOLANGIOGRAM;  Surgeon: Nadeen Landau, MD;  Location: [facility] ORS;  Service: General;  Laterality: N/A;   Family History  Problem Relation Age of Onset  . Diabetes Mother   . Arthritis Father   . Diabetes Father   . Heart disease Father   . Hypertension Father   . Stroke Father   . Diabetes Maternal Grandmother   . Hypertension Maternal Grandmother   . Heart disease Paternal Grandmother   . Hypertension Paternal Grandmother   .  Lupus Sister   . Deafness Sister    Social History   Social History  . Marital status: Single    Spouse name: N/A  . Number of children: 1  . Years of education: N/A   Occupational History  . Not on file.   Social History Main Topics  . Smoking status: Never Smoker  . Smokeless tobacco: Never Used  . Alcohol use 0.0 oz/week     Comment: OCC  . Drug use: No  . Sexual activity: Yes    Partners: Male    Birth control/ protection: Condom, Patch   Other Topics Concern  . Not on file   Social History Narrative  . No narrative on file    Interim medical history since last visit reviewed. Allergies and medications reviewed  Review of Systems Per HPI unless specifically indicated above     Objective:    BP 122/78   Pulse 97   Temp 98.4 F (36.9 C) (Oral)   Resp 16   Wt 229 lb 6.4 oz (104.1 kg)    LMP 03/21/2017   SpO2 97%   BMI 33.88 kg/m   Wt Readings from Last 3 Encounters:  03/21/17 229 lb 6.4 oz (104.1 kg)  02/22/17 232 lb 11.2 oz (105.6 kg)  01/13/17 231 lb 6.4 oz (105 kg)    Physical Exam  Constitutional: She appears well-developed and well-nourished. No distress.  HENT:  Head: Normocephalic and atraumatic.  Eyes: EOM are normal. No scleral icterus.  Neck: No thyromegaly present.  Cardiovascular: Normal rate, regular rhythm and normal heart sounds.   No murmur heard. Pulmonary/Chest: Effort normal and breath sounds normal.  Abdominal: Soft. Bowel sounds are normal. She exhibits no distension.  Musculoskeletal: Normal range of motion. She exhibits no edema.  Neurological: She is alert.  Skin: Skin is warm and dry. She is not diaphoretic. No pallor.  Psychiatric: She has a normal mood and affect. Her behavior is normal. Judgment and thought content normal.   Diabetic Foot Form - Detailed   Diabetic Foot Exam - detailed Diabetic Foot exam was performed with the following findings:  Yes 03/21/2017 12:54 PM  Visual Foot Exam completed.:  Yes  Pulse Foot Exam completed.:  Yes  Right Dorsalis Pedis:  Present Left Dorsalis Pedis:  Present  Sensory Foot Exam Completed.:  Yes Semmes-Weinstein Monofilament Test R Site 1-Great Toe:  Pos L Site 1-Great Toe:  Pos        Results for orders placed or performed in visit on 02/22/17  COMPLETE METABOLIC PANEL WITH GFR  Result Value Ref Range   Glucose, Bld 120 65 - 139 mg/dL   BUN 9 7 - 25 mg/dL   Creat 9.60 4.54 - 0.98 mg/dL   GFR, Est Non African American 116 > OR = 60 mL/min/1.69m2   GFR, Est African American 135 > OR = 60 mL/min/1.65m2   BUN/Creatinine Ratio NOT APPLICABLE 6 - 22 (calc)   Sodium 140 135 - 146 mmol/L   Potassium 4.0 3.5 - 5.3 mmol/L   Chloride 105 98 - 110 mmol/L   CO2 26 20 - 32 mmol/L   Calcium 9.2 8.6 - 10.2 mg/dL   Total Protein 6.6 6.1 - 8.1 g/dL   Albumin 4.1 3.6 - 5.1 g/dL   Globulin 2.5 1.9  - 3.7 g/dL (calc)   AG Ratio 1.6 1.0 - 2.5 (calc)   Total Bilirubin 0.3 0.2 - 1.2 mg/dL   Alkaline phosphatase (APISO) 41 33 - 115 U/L  AST 12 10 - 30 U/L   ALT 10 6 - 29 U/L  CBC  Result Value Ref Range   WBC 5.3 3.8 - 10.8 Thousand/uL   RBC 4.31 3.80 - 5.10 Million/uL   Hemoglobin 12.2 11.7 - 15.5 g/dL   HCT 62.9 52.8 - 41.3 %   MCV 84.5 80.0 - 100.0 fL   MCH 28.3 27.0 - 33.0 pg   MCHC 33.5 32.0 - 36.0 g/dL   RDW 24.4 01.0 - 27.2 %   Platelets 256 140 - 400 Thousand/uL   MPV 11.5 7.5 - 12.5 fL  TSH  Result Value Ref Range   TSH 0.47 mIU/L  Hemoglobin A1c  Result Value Ref Range   Hgb A1c MFr Bld 6.5 (H) <5.7 % of total Hgb   Mean Plasma Glucose 140 (calc)   eAG (mmol/L) 7.7 (calc)      Assessment & Plan:   Problem List Items Addressed This Visit      Cardiovascular and Mediastinum   Essential hypertension, benign (Chronic)    Controlled right now        Digestive   Fatty liver (Chronic)    Recent liver enzymes reviewed; encouraged weight loss and low fat diet        Endocrine   Diabetes mellitus type 2, controlled (HCC) (Chronic)    Recent A1c reviewed; foot exam by MD was normal; encouraged weight loss        Genitourinary   Bacterial vaginosis    Working with GYN to reduce episodes        Other   Obesity (BMI 30.0-34.9)    Praise given; keep working on that; if she keeps losing weight, will hopefully be able to get off of more medicine          Follow up plan: Return in about 5 months (around 08/23/2017) for twenty minute follow-up with fasting labs.  An after-visit summary was printed and given to the patient at check-out.  Please see the patient instructions which may contain other information and recommendations beyond what is mentioned above in the assessment and plan.  Meds ordered this encounter  Medications  . medroxyPROGESTERone (PROVERA) 10 MG tablet    Sig: Take 10 mg by mouth daily.  . norgestimate-ethinyl estradiol  (ORTHO-CYCLEN,SPRINTEC,PREVIFEM) 0.25-35 MG-MCG tablet    Sig: Take 1 tablet by mouth daily.   [name] triamcinolone ointment (KENALOG) 0.1 %    Sig: Apply to hives up to twice daily as needed.    No orders of the defined types were placed in this encounter.

## 2017-04-01 ENCOUNTER — Encounter: Payer: Self-pay | Admitting: Emergency Medicine

## 2017-04-01 ENCOUNTER — Emergency Department
Admission: EM | Admit: 2017-04-01 | Discharge: 2017-04-01 | Disposition: A | Discharge status: Home / Self Care | Payer: Medicaid Other | Attending: Emergency Medicine | Admitting: Emergency Medicine

## 2017-04-01 DIAGNOSIS — E119 Type 2 diabetes mellitus without complications: Secondary | ICD-10-CM | POA: Diagnosis not present

## 2017-04-01 DIAGNOSIS — Z7984 Long term (current) use of oral hypoglycemic drugs: Secondary | ICD-10-CM | POA: Diagnosis not present

## 2017-04-01 DIAGNOSIS — N939 Abnormal uterine and vaginal bleeding, unspecified: Secondary | ICD-10-CM | POA: Diagnosis present

## 2017-04-01 DIAGNOSIS — I10 Essential (primary) hypertension: Secondary | ICD-10-CM | POA: Insufficient documentation

## 2017-04-01 DIAGNOSIS — Z91040 Latex allergy status: Secondary | ICD-10-CM | POA: Diagnosis not present

## 2017-04-01 LAB — CHLAMYDIA/NGC RT PCR (ARMC ONLY)
CHLAMYDIA TR: NOT DETECTED
N GONORRHOEAE: NOT DETECTED

## 2017-04-01 LAB — WET PREP, GENITAL
Clue Cells Wet Prep HPF POC: NONE SEEN
SPERM: NONE SEEN
Trich, Wet Prep: NONE SEEN
Yeast Wet Prep HPF POC: NONE SEEN

## 2017-04-01 LAB — POCT PREGNANCY, URINE: Preg Test, Ur: NEGATIVE

## 2017-04-01 NOTE — ED Notes (Signed)
Pt reports that she was told by OBGYN to not do a week off from birth control patches due to recurrent BV.  Pt was then told to take medication to stop bleeding, but states that bleeding has never stopped.  Pt states that the bleeding never fully stopped.  Pt states that bleeding is mostly light and red in color and sometimes brown in color.  Pt states that her OB GYN told her the only way to fix this would be to use an IUD, but pt states she does not want an IUD.  Pt states OB GYN told her that there was nothing else they could do for her.  Pt denies being sent for an ultrasound nor having any blood work done to check hormone levels, despite asking MD for these things.

## 2017-04-01 NOTE — ED Notes (Signed)
Patient reports no pain, does not appear to be in any distress or discomfort and has steady gate and able to walk to lobby.

## 2017-04-01 NOTE — ED Triage Notes (Signed)
Pt reports she has been having her menstrual cycle since late September reports seen her GYN reports followed her recommendations started new birth control and continues with vaginal bleeding and lower abdominal cramping, reports a headache for 3 days denies any other symptom, Pt talks in complete sentences no distress noted

## 2017-04-01 NOTE — Discharge Instructions (Signed)
Your exam was essentially normal today. Continue to take your birth control pills, at the same time, everyday. Follow-up with your OBG provider for further management and discussion of other options.

## 2017-04-01 NOTE — ED Provider Notes (Signed)
[facility] Emergency Department Provider Note ____________________________________________  Time seen: 1510  I have reviewed the triage vital signs and the nursing notes.  HISTORY  Chief Complaint  Vaginal Bleeding  HPI Penny Gonzalez is a 21 y.o. female presents to the ED for evaluation of irregular vaginal bleeding. Patient with a history of vaginal bleeding presents to the ED with complaints of continued bleeding for the last 2 weeks. She is currently on a course of oral contraceptives. She been advised by her OB provider to take the active pills continuously for the next 12 weeks before allow herself to have a bleed. She continues to note intermittent, scant amounts of brownish to bright red blood when she wipes. She also has concerns for recurrent BV infection. She has previously been treated with Metrogel and Flagyl, long term. She denies any active bleeding that would soak a panty liner or maxi pad. She denies any pelvic pain, discharge, or fevers. She is under the care of her OB chief provider and has been previously treated with contraceptive patches, when she reported that she was still having some mild breakthrough bleeding, the provider suggested she switch to the pills. She denies any dysuria, nausea, flank pain, or syncope.   Past Medical History:  Diagnosis Date  . Anemia, iron deficiency, inadequate dietary intake 12/01/2014  . Bronchitis 04/2016   dr lada cleared pt medically for surgery after cxr results were normal (05-16-16)  . Cold 05/12/2016   RESOLVING PER PT  . Depression    patient has not been officially diagnosed but has symptoms  . Diabetes mellitus without complication (HCC)   . Essential hypertension, benign 03/31/2016  . Irregular periods/menstrual cycles   . Joint pain of leg   . Major depression 11/23/2015    Patient Active Problem List   Diagnosis Date Noted  . Obesity (BMI 30.0-34.9) 03/21/2017  . Latex allergy, contact  dermatitis 01/13/2017  . Dermatitis 12/30/2016  . Bacterial vaginosis 10/14/2016  . Allergic rhinitis 09/23/2016  . Fever blister 08/21/2016  . Fatty liver 05/07/2016  . Foot pain, bilateral 03/31/2016  . Essential hypertension, benign 03/31/2016  . Proteinuria 01/30/2016  . Major depression 11/23/2015  . Vitamin D deficiency 11/19/2015  . Preventative health care 10/27/2015  . Medication monitoring encounter 10/27/2015  . Constipation, slow transit 02/18/2015  . Tonsil stone 02/18/2015  . Diabetes mellitus type 2, controlled (HCC) 11/26/2014  . Headache disorder 11/26/2014  . Arthralgia of multiple joints 11/26/2014    Past Surgical History:  Procedure Laterality Date  . CHOLECYSTECTOMY N/A 05/19/2016   Procedure: LAPAROSCOPIC CHOLECYSTECTOMY WITH INTRAOPERATIVE CHOLANGIOGRAM;  Surgeon: Nadeen Landau, MD;  Location: [facility] ORS;  Service: General;  Laterality: N/A;    Prior to Admission medications   Medication Sig Start Date End Date Taking? Authorizing Provider  cetirizine (ZYRTEC) 10 MG tablet Take 1 tablet (10 mg total) by mouth daily. 01/13/17   Doren Custard, FNP  diphenhydrAMINE (BENADRYL) 25 MG tablet Take 1 tablet (25 mg total) by mouth at bedtime as needed. 01/13/17   Doren Custard, FNP  medroxyPROGESTERone (PROVERA) 10 MG tablet Take 10 mg by mouth daily. 03/17/17   [provider]  metFORMIN (GLUCOPHAGE-XR) 500 MG 24 hr tablet Take 1 tablet (500 mg total) by mouth daily. Patient not taking: Reported on 02/22/2017 10/14/16   [name], MD  norelgestromin-ethinyl estradiol [employer]) 150-35 MCG/24HR transdermal patch Place 1 patch onto the skin once a week. With one week off, then repeat  11/06/16   Kerman PasseyLada, Melinda P, MD  norgestimate-ethinyl estradiol (ORTHO-CYCLEN,SPRINTEC,PREVIFEM) 0.25-35 MG-MCG tablet Take 1 tablet by mouth daily.  03/17/17   [provider]  triamcinolone ointment (KENALOG) 0.1 % Apply to hives up to twice daily as needed. 03/07/17    [provider]    Allergies Latex  Family History  Problem Relation Age of Onset  . Diabetes Mother   . Arthritis Father   . Diabetes Father   . Heart disease Father   . Hypertension Father   . Stroke Father   . Diabetes Maternal Grandmother   . Hypertension Maternal Grandmother   . Heart disease Paternal Grandmother   . Hypertension Paternal Grandmother   . Lupus Sister   . Deafness Sister     Social History Social History  Substance Use Topics  . Smoking status: Never Smoker  . Smokeless tobacco: Never Used  . Alcohol use 0.0 oz/week     Comment: OCC    Review of Systems  Constitutional: Negative for fever. Eyes: Negative for visual changes. ENT: Negative for sore throat. Cardiovascular: Negative for chest pain. Respiratory: Negative for shortness of breath. Gastrointestinal: Negative for abdominal pain, vomiting and diarrhea. Genitourinary: Negative for dysuria. Abnormal vaginal bleeding. Musculoskeletal: Negative for back pain. Skin: Negative for rash. Neurological: Negative for headaches, focal weakness or numbness. ____________________________________________  PHYSICAL EXAM:  VITAL SIGNS: ED Triage Vitals  Enc Vitals Group     BP 04/01/17 1449 (!) 145/94     Pulse Rate 04/01/17 1449 84     Resp 04/01/17 1449 20     Temp 04/01/17 1449 99.6 F (37.6 C)     Temp Source 04/01/17 1449 Oral     SpO2 04/01/17 1449 98 %     Weight 04/01/17 1450 229 lb (103.9 kg)     Height 04/01/17 1450 5\' 9"  (1.753 m)     Head Circumference --      Peak Flow --      Pain Score 04/01/17 1448 8     Pain Loc --      Pain Edu? --      Excl. in GC? --     Constitutional: Alert and oriented. Well appearing and in no distress. Head: Normocephalic and atraumatic. Cardiovascular: Normal rate, regular rhythm. Normal distal pulses. Respiratory: Normal respiratory effort. No wheezes/rales/rhonchi. GU: normal external genitalia. No blood in the vagina. Scant brownish  discharge noted. No CMT. No adnexal masses.  Musculoskeletal: Nontender with normal range of motion in all extremities.  Neurologic:  Normal gait without ataxia. Normal speech and language. No gross focal neurologic deficits are appreciated. Skin:  Skin is warm, dry and intact. No rash noted. ____________________________________________   LABS (pertinent positives/negatives) Labs Reviewed  WET PREP, GENITAL - Abnormal; Notable for the following:       Result Value   WBC, Wet Prep HPF POC RARE (*)    All other components within normal limits  CHLAMYDIA/NGC RT PCR ([facility] ONLY)  POC URINE PREG, ED  POCT PREGNANCY, URINE  ____________________________________________  INITIAL IMPRESSION / ASSESSMENT AND PLAN / ED COURSE  Presents to the ED for evaluation of a history of abnormal vaginal bleeding. Patient on exam is benign pelvic exam without any signs of active vaginal bleeding or BV. She is reassured by her exam. A moderate amount of time was spent discussing breakthrough bleeding and hormone therapy. She is encouraged to dose her pills at the same time everyday. She is also advised to follow-up with her provider  for alternative treatment options. Return precautions are reviewed. GC culture is pending at the time of discharge.  ____________________________________________  FINAL CLINICAL IMPRESSION(S) / ED DIAGNOSES  Final diagnoses:  Abnormal vaginal bleeding     Zubayr Bednarczyk, Charlesetta Ivory, PA-C 04/01/17 1807    Nita Sickle, MD 04/05/17 1739

## 2017-04-10 ENCOUNTER — Encounter: Payer: Self-pay | Admitting: Emergency Medicine

## 2017-04-10 ENCOUNTER — Emergency Department
Admission: EM | Admit: 2017-04-10 | Discharge: 2017-04-10 | Disposition: A | Discharge status: Home / Self Care | Payer: Medicaid Other | Attending: Emergency Medicine | Admitting: Emergency Medicine

## 2017-04-10 DIAGNOSIS — J069 Acute upper respiratory infection, unspecified: Secondary | ICD-10-CM | POA: Diagnosis not present

## 2017-04-10 DIAGNOSIS — Z7984 Long term (current) use of oral hypoglycemic drugs: Secondary | ICD-10-CM | POA: Insufficient documentation

## 2017-04-10 DIAGNOSIS — Z79899 Other long term (current) drug therapy: Secondary | ICD-10-CM | POA: Diagnosis not present

## 2017-04-10 DIAGNOSIS — I10 Essential (primary) hypertension: Secondary | ICD-10-CM | POA: Insufficient documentation

## 2017-04-10 DIAGNOSIS — Z91040 Latex allergy status: Secondary | ICD-10-CM | POA: Insufficient documentation

## 2017-04-10 DIAGNOSIS — E119 Type 2 diabetes mellitus without complications: Secondary | ICD-10-CM | POA: Diagnosis not present

## 2017-04-10 DIAGNOSIS — J029 Acute pharyngitis, unspecified: Secondary | ICD-10-CM | POA: Diagnosis present

## 2017-04-10 MED ORDER — CETIRIZINE-PSEUDOEPHEDRINE ER 5-120 MG PO TB12: 1.0000 | ORAL_TABLET | Freq: Two times a day (BID) | ORAL | 0 refills | Status: DC

## 2017-04-10 NOTE — ED Provider Notes (Signed)
[name] Emergency Department Provider Note   ____________________________________________   First MD Initiated Contact with Patient 04/10/17 [phone]     (approximate)  I have reviewed the triage vital signs and the nursing notes.   HISTORY  Chief Complaint Sore Throat   HPI Penny Gonzalez is a 21 y.o. female  is here today with complaint of sore throat and body aches and started last night. Patient has not taking any over-the-counter medication. Patient states that when she woke she fell most worse than she did last night. She denies any nausea, vomiting, diarrhea. She is unaware of any fever or chills.she rates her pain as an 8 out of 10.   Past Medical History:  Diagnosis Date  . Anemia, iron deficiency, inadequate dietary intake 12/01/2014  . Bronchitis 04/2016   dr lada cleared pt medically for surgery after cxr results were normal (05-16-16)  . Cold 05/12/2016   RESOLVING PER PT  . Depression    patient has not been officially diagnosed but has symptoms  . Diabetes mellitus without complication (HCC)   . Essential hypertension, benign 03/31/2016  . Irregular periods/menstrual cycles   . Joint pain of leg   . Major depression 11/23/2015    Patient Active Problem List   Diagnosis Date Noted  . Obesity (BMI 30.0-34.9) 03/21/2017  . Latex allergy, contact dermatitis 01/13/2017  . Dermatitis 12/30/2016  . Bacterial vaginosis 10/14/2016  . Allergic rhinitis 09/23/2016  . Fever blister 08/21/2016  . Fatty liver 05/07/2016  . Foot pain, bilateral 03/31/2016  . Essential hypertension, benign 03/31/2016  . Proteinuria 01/30/2016  . Major depression 11/23/2015  . Vitamin D deficiency 11/19/2015  . Preventative health care 10/27/2015  . Medication monitoring encounter 10/27/2015  . Constipation, slow transit 02/18/2015  . Tonsil stone 02/18/2015  . Diabetes mellitus type 2, controlled (HCC) 11/26/2014  . Headache disorder 11/26/2014  .  Arthralgia of multiple joints 11/26/2014    Past Surgical History:  Procedure Laterality Date  . CHOLECYSTECTOMY N/A 05/19/2016   Procedure: LAPAROSCOPIC CHOLECYSTECTOMY WITH INTRAOPERATIVE CHOLANGIOGRAM;  Surgeon: Nadeen LandauJarvis Wilton Smith, MD;  Location: [facility] ORS;  Service: General;  Laterality: N/A;    Prior to Admission medications   Medication Sig Start Date End Date Taking? Authorizing Provider  cetirizine-pseudoephedrine (ZYRTEC-D) 5-120 MG tablet Take 1 tablet by mouth 2 (two) times daily. 04/10/17   Tommi RumpsSummers, Tiana Sivertson L, PA-C  medroxyPROGESTERone (PROVERA) 10 MG tablet Take 10 mg by mouth daily. 03/17/17   [provider]  metFORMIN (GLUCOPHAGE-XR) 500 MG 24 hr tablet Take 1 tablet (500 mg total) by mouth daily. Patient not taking: Reported on 02/22/2017 10/14/16   Kerman PasseyLada, Melinda P, MD  norelgestromin-ethinyl estradiol [employer]ico(XULANE) 150-35 MCG/24HR transdermal patch Place 1 patch onto the skin once a week. With one week off, then repeat 11/06/16   Kerman PasseyLada, Melinda P, MD  norgestimate-ethinyl estradiol (ORTHO-CYCLEN,SPRINTEC,PREVIFEM) 0.25-35 MG-MCG tablet Take 1 tablet by mouth daily.  03/17/17   [provider]  triamcinolone ointment (KENALOG) 0.1 % Apply to hives up to twice daily as needed. 03/07/17   [provider]    Allergies Latex  Family History  Problem Relation Age of Onset  . Diabetes Mother   . Arthritis Father   . Diabetes Father   . Heart disease Father   . Hypertension Father   . Stroke Father   . Diabetes Maternal Grandmother   . Hypertension Maternal Grandmother   . Heart disease Paternal Grandmother   . Hypertension Paternal Grandmother   .  Lupus Sister   . Deafness Sister     Social History Social History  Substance Use Topics  . Smoking status: Never Smoker  . Smokeless tobacco: Never Used  . Alcohol use 0.0 oz/week     Comment: OCC    Review of Systems Constitutional: No fever/chills Eyes: No visual changes. ENT: positive sore  throat.  Negative for ear pain. Cardiovascular: Denies chest pain. Respiratory: Denies shortness of breath. Gastrointestinal: No abdominal pain.  No nausea, no vomiting.  No diarrhea.   Genitourinary: Negative for dysuria. Musculoskeletal: Negative for back pain. Skin: Negative for rash. Neurological: Negative for headaches, focal weakness or numbness. ____________________________________________   PHYSICAL EXAM:  VITAL SIGNS: ED Triage Vitals [04/10/17 0717]  Enc Vitals Group     BP 128/79     Pulse Rate 93     Resp 20     Temp 97.9 F (36.6 C)     Temp Source Oral     SpO2 96 %     Weight 229 lb (103.9 kg)     Height      Head Circumference      Peak Flow      Pain Score 8     Pain Loc      Pain Edu?      Excl. in GC?    Constitutional: Alert and oriented. Well appearing and in no acute distress. Eyes: Conjunctivae are normal.  Head: Atraumatic. Nose: mild congestion/rhinnorhea.  EACs and TMs are clear bilaterally. Mouth/Throat: Mucous membranes are moist.  Oropharynx non-erythematous. moderate posterior drainage. Neck: No stridor.   Hematological/Lymphatic/Immunilogical: No cervical lymphadenopathy. Cardiovascular: Normal rate, regular rhythm. Grossly normal heart sounds.  Good peripheral circulation. Respiratory: Normal respiratory effort.  No retractions. Lungs CTAB. Gastrointestinal: Soft and nontender. No distention.  Musculoskeletal: Ms. Patton Salles and lower extremities benign difficulty. Normal gait was noted. Neurologic:  Normal speech and language. No gross focal neurologic deficits are appreciated. No gait instability. Skin:  Skin is warm, dry and intact. No rash noted. Psychiatric: Mood and affect are normal. Speech and behavior are normal.  ____________________________________________   LABS (all labs ordered are listed, but only abnormal results are displayed)  Labs Reviewed - No data to display  PROCEDURES  Procedure(s) performed:  None  Procedures  Critical Care performed: No  ____________________________________________   INITIAL IMPRESSION / ASSESSMENT AND PLAN / ED COURSE Patient was given a prescription for Zyrtec-D one daily for congestion. She is also use saline nose spray for nasal congestion. Increase fluids. Take Tylenol or ibuprofen as needed for fever and body aches. Patient is follow-up with her PCP if any continued problems.   ____________________________________________   FINAL CLINICAL IMPRESSION(S) / ED DIAGNOSES  Final diagnoses:  Upper respiratory tract infection, unspecified type      NEW MEDICATIONS STARTED DURING THIS VISIT:  Discharge Medication List as of 04/10/2017  7:51 AM    START taking these medications   Details  cetirizine-pseudoephedrine (ZYRTEC-D) 5-120 MG tablet Take 1 tablet by mouth 2 (two) times daily., Starting Mon 04/10/2017, Print         Note:  This document was prepared using Dragon voice recognition software and may include unintentional dictation errors.    Tommi Rumps, PA-C 04/10/17 0940    Arnaldo Natal, MD 04/10/17 1024

## 2017-04-10 NOTE — ED Triage Notes (Signed)
Pt presents with sore throat and body aches started last night.

## 2017-04-10 NOTE — Discharge Instructions (Signed)
Follow-up with your  primary care provider if any continued problems. Begin taking Zyrtec-D one daily for congestion. You may also use saline nose spray if needed for nasal congestion. Increase fluids. Take ibuprofen or Tylenol if needed for fever or body aches.

## 2017-04-10 NOTE — ED Notes (Signed)
Pt states her throat started hurting last night and that her body feels "weak". Pt denies any fever or cough. Pt is A/O. Father at bedside.

## 2017-05-08 ENCOUNTER — Ambulatory Visit: Payer: Self-pay | Admitting: *Deleted

## 2017-05-08 NOTE — Telephone Encounter (Signed)
Pt is had some spotting after intercourse. Her partner noticed it but she did not. She states some vaginal irritation noted. She also c/o some abd and back pain. The back pain is worst.  She is requesting to see her pcp because she can not see her gyn anytime soon.  Reason for Disposition . Bleeding or spotting occurs after sex (Exception: first intercourse)  Answer Assessment - Initial Assessment Questions 1. AMOUNT: "Describe the bleeding that you are having."    - SPOTTING: spotting, or pinkish / brownish mucous discharge; does not fill panti-liner or pad    - MILD:  less than 1 pad / hour; less than patient's usual menstrual bleeding   - MODERATE: 1-2 pads / hour; small-medium blood clots (e.g., pea, grape, small coin)    - SEVERE: soaking 2 or more pads/hour for 2 or more hours; bleeding not contained by pads or continuous red blood from vagina; large blood clots (e.g., golf ball, large coin)   spotting 2. ONSET: "When did the bleeding begin?" "Is it continuing now?"     Yesterday, not continuing 3. MENSTRUAL PERIOD: "When was the last normal menstrual period?" "How is this different than your period?"     October 4. REGULARITY: "How regular are your periods?"     Taking birth control pills now 5. ABDOMINAL PAIN: "Do you have any pain?" "How bad is the pain?"  (e.g., Scale 1-10; mild, moderate, or severe)   - MILD (1-3): doesn't interfere with normal activities, abdomen soft and not tender to touch    - MODERATE (4-7): interferes with normal activities or awakens from sleep, tender to touch    - SEVERE (8-10): excruciating pain, doubled over, unable to do any normal activities      7 6. PREGNANCY: "Could you be pregnant?" "Are you sexually active?" "Did you recently give birth?"     Not sure, yes sexually active, no recent birth 277. BREASTFEEDING: "Are you breastfeeding?"     no 8. HORMONES: "Are you taking any hormone medications, prescription or OTC?" (e.g., birth control pills,  estrogen)     Birth controls pills 9. BLOOD THINNERS: "Do you take any blood thinners?" (e.g., Coumadin/warfarin, Pradaxa/dabigatran, aspirin)     no 10. CAUSE: "What do you think is causing the bleeding?" (e.g., recent gyn surgery, recent gyn procedure; known bleeding disorder, cervical cancer, polycystic ovarian disease, fibroids)         Not sure 11. HEMODYNAMIC STATUS: "Are you weak or feeling lightheaded?" If so, ask: "Can you stand and walk normally?"        No, can stand and walk normally 12. OTHER SYMPTOMS: "What other symptoms are you having with the bleeding?" (e.g., passed tissue, vaginal discharge, fever, menstrual-type cramps)       cramping  Protocols used: VAGINAL BLEEDING - ABNORMAL-A-AH

## 2017-05-12 ENCOUNTER — Ambulatory Visit: Payer: Medicaid Other | Admitting: Family Medicine

## 2017-05-12 ENCOUNTER — Encounter: Payer: Self-pay | Admitting: Family Medicine

## 2017-05-12 VITALS — BP 134/82 | HR 100 | Temp 98.4°F | Resp 16 | Wt 230.0 lb

## 2017-05-12 DIAGNOSIS — M545 Low back pain: Secondary | ICD-10-CM

## 2017-05-12 DIAGNOSIS — E119 Type 2 diabetes mellitus without complications: Secondary | ICD-10-CM

## 2017-05-12 DIAGNOSIS — Z7251 High risk heterosexual behavior: Secondary | ICD-10-CM

## 2017-05-12 LAB — GLUCOSE, POCT (MANUAL RESULT ENTRY): POC Glucose: 120 mg/dl — AB (ref 70–99)

## 2017-05-12 LAB — POCT URINALYSIS DIPSTICK
BILIRUBIN UA: NEGATIVE
Blood, UA: NEGATIVE
GLUCOSE UA: NEGATIVE
KETONES UA: POSITIVE
LEUKOCYTES UA: NEGATIVE
Nitrite, UA: NEGATIVE
PH UA: 5.5 (ref 5.0–8.0)
Protein, UA: NEGATIVE
Spec Grav, UA: 1.02 (ref 1.010–1.025)
Urobilinogen, UA: 0.2 E.U./dL

## 2017-05-12 MED ORDER — FLUCONAZOLE 150 MG PO TABS: 150.0000 mg | ORAL_TABLET | Freq: Once | ORAL | 0 refills | Status: AC

## 2017-05-12 MED ORDER — METFORMIN HCL ER 500 MG PO TB24
500.0000 mg | ORAL_TABLET | Freq: Every day | ORAL | 5 refills | Status: DC
Start: 2017-05-12 — End: 2017-08-23

## 2017-05-12 NOTE — Progress Notes (Signed)
BP 134/82   Pulse 100   Temp 98.4 F (36.9 C) (Oral)   Resp 16   Wt 230 lb (104.3 kg)   LMP 03/22/2017   SpO2 99%   BMI 33.97 kg/m    Subjective:    Patient ID: Penny AmosSamantha B Gonzalez, female    DOB: 07/24/1995, 21 y.o.   MRN: [id number]  HPI: Penny Gonzalez is a 21 y.o. female  Chief Complaint  Patient presents with  . Back Pain    lower back 2 weeks  . Abdominal Pain    lower 2 weeks   HPI Patient is here for an acute visit She is having stress at work and in her personal life She is going to be raising her child alone, father of child has 17-20 years Some discharge, white, clumpy like a yeast infection just once, yesterday before intercourse; no fevers Stuffy nose, congestion Last sexual intercourse was yesterday morning; no protection with that partner Her other partner is [id number], she trusts him, an older guy, safe and consensual  She is not taking her metformin; stopped it a few weeks ago BS 120 just checked by CMA  She did not take her OCP today; she does not think she is subconsciously trying to get pregnant  [facility] 05/12/2017 03/21/2017 12/27/2016 10/14/2016 10/14/2016  Decreased Interest 2 0 0 0 0  Down, Depressed, Hopeless 2 0 0 1 -  PHQ - 2 Score 4 0 0 1 0  Altered sleeping 1 - - - -  Tired, decreased energy 2 - - - -  Change in appetite 2 - - - -  Feeling bad or failure about yourself  0 - - - -  Trouble concentrating 1 - - - -  Moving slowly or fidgety/restless 0 - - - -  Suicidal thoughts 0 - - - -  PHQ-9 Score 10 - - - -  Difficult doing work/chores Somewhat difficult - - - -  she is going to see her counselor after she leaves here  Relevant past medical, surgical, family and social history reviewed Past Medical History:  Diagnosis Date  . Anemia, iron deficiency, inadequate dietary intake 12/01/2014  . Bronchitis 04/2016   dr Paloma Grange cleared pt medically for surgery after cxr results were normal (05-16-16)  . Cold 05/12/2016   RESOLVING PER PT  . Depression    patient has not been officially diagnosed but has symptoms  . Diabetes mellitus without complication (HCC)   . Essential hypertension, benign 03/31/2016  . Irregular periods/menstrual cycles   . Joint pain of leg   . Major depression 11/23/2015   Past Surgical History:  Procedure Laterality Date  . CHOLECYSTECTOMY N/A 05/19/2016   Procedure: LAPAROSCOPIC CHOLECYSTECTOMY WITH INTRAOPERATIVE CHOLANGIOGRAM;  Surgeon: Nadeen LandauJarvis Wilton Smith, MD;  Location: [facility] ORS;  Service: General;  Laterality: N/A;   Family History  Problem Relation Age of Onset  . Diabetes Mother   . Arthritis Father   . Diabetes Father   . Heart disease Father   . Hypertension Father   . Stroke Father   . Diabetes Maternal Grandmother   . Hypertension Maternal Grandmother   . Heart disease Paternal Grandmother   . Hypertension Paternal Grandmother   . Lupus Sister   . Deafness Sister    Social History   Tobacco Use  . Smoking status: Never Smoker  . Smokeless tobacco: Never Used  Substance Use Topics  . Alcohol use: Yes    Alcohol/week: 0.0 oz  Comment: OCC  . Drug use: No   Interim medical history since last visit reviewed. Allergies and medications reviewed  Review of Systems Per HPI unless specifically indicated above     Objective:    BP 134/82   Pulse 100   Temp 98.4 F (36.9 C) (Oral)   Resp 16   Wt 230 lb (104.3 kg)   LMP 03/22/2017   SpO2 99%   BMI 33.97 kg/m   Wt Readings from Last 3 Encounters:  05/12/17 230 lb (104.3 kg)  04/10/17 229 lb (103.9 kg)  04/01/17 229 lb (103.9 kg)    Physical Exam  Constitutional: She appears well-developed and well-nourished. No distress.  Patient was in no distress  HENT:  Mouth/Throat: Mucous membranes are normal.  Eyes: EOM are normal. No scleral icterus.  Cardiovascular: Normal rate and regular rhythm.  Pulmonary/Chest: Effort normal and breath sounds normal.  Abdominal: Soft. Bowel sounds are normal.  She exhibits no distension and no mass. There is no tenderness. There is no guarding.  Musculoskeletal:       Lumbar back: She exhibits no tenderness, no bony tenderness and no deformity.  Neurological: She is alert.  Psychiatric: She has a normal mood and affect. Her behavior is normal. Her mood appears not anxious. She does not exhibit a depressed mood.  Patient answered a phone call and continued to send text messages throughout the visit   Diabetic Foot Form - Detailed   Diabetic Foot Exam - detailed Diabetic Foot exam was performed with the following findings:  Yes 05/12/2017  4:24 PM  Visual Foot Exam completed.:  Yes  Pulse Foot Exam completed.:  Yes  Right Dorsalis Pedis:  Present Left Dorsalis Pedis:  Present  Sensory Foot Exam Completed.:  Yes Semmes-Weinstein Monofilament Test R Site 1-Great Toe:  Pos L Site 1-Great Toe:  Pos          Assessment & Plan:   Problem List Items Addressed This Visit      Endocrine   Diabetes mellitus type 2, controlled (HCC) (Chronic)    Foot exam by MD today; encuraged patient to start back on metformin      Relevant Medications   metFORMIN (GLUCOPHAGE-XR) 500 MG 24 hr tablet   Other Relevant Orders   POCT Glucose (CBG) (Completed)    Other Visit Diagnoses    Unprotected sex    -  Primary   urged protection, safe sex   Relevant Orders   GC/CT Probe, Amp (Throat)   C. trachomatis/N. gonorrhoeae RNA   Acute low back pain, unspecified back pain laterality, with sciatica presence unspecified       Relevant Orders   POCT urinalysis dipstick (Completed)       Follow up plan: Return in about 4 weeks (around 06/09/2017) for twenty minute follow-up with fasting labs.  An after-visit summary was printed and given to the patient at check-out.  Please see the patient instructions which may contain other information and recommendations beyond what is mentioned above in the assessment and plan.  Meds ordered this encounter  Medications    . fluconazole (DIFLUCAN) 150 MG tablet    Sig: Take 1 tablet (150 mg total) by mouth once for 1 dose.    Dispense:  1 tablet    Refill:  0  . metFORMIN (GLUCOPHAGE-XR) 500 MG 24 hr tablet    Sig: Take 1 tablet (500 mg total) by mouth daily.    Dispense:  30 tablet    Refill:  5  Please use this one instead -- thank you! New instructions    Orders Placed This Encounter  Procedures  . GC/CT Probe, Amp (Throat)  . C. trachomatis/N. gonorrhoeae RNA  . C. trachomatis/N. gonorrhoeae RNA  . POCT urinalysis dipstick  . POCT Glucose (CBG)

## 2017-05-12 NOTE — Patient Instructions (Addendum)
Try to drink more water I really do encourage you to be safe Start back on the metformin Please do keep working with your counselor Positive self-talk

## 2017-05-13 LAB — C. TRACHOMATIS/N. GONORRHOEAE RNA
C. TRACHOMATIS RNA, TMA: NOT DETECTED
N. gonorrhoeae RNA, TMA: NOT DETECTED

## 2017-05-17 NOTE — Assessment & Plan Note (Signed)
Foot exam by MD today; encuraged patient to start back on metformin

## 2017-05-19 IMAGING — XA DG CHOLANGIOGRAM OPERATIVE
2 series · 2 of 2 positions shown · non-contrast
Comparison: None

CLINICAL DATA: Intraoperative cholangiogram during laparoscopic
cholecystectomy.

EXAM:
INTRAOPERATIVE CHOLANGIOGRAM
FLUOROSCOPY TIME:  1 second

[Series 1: cont. · 1 of 1 slices shown (1 of 2)]
[im 1/1]
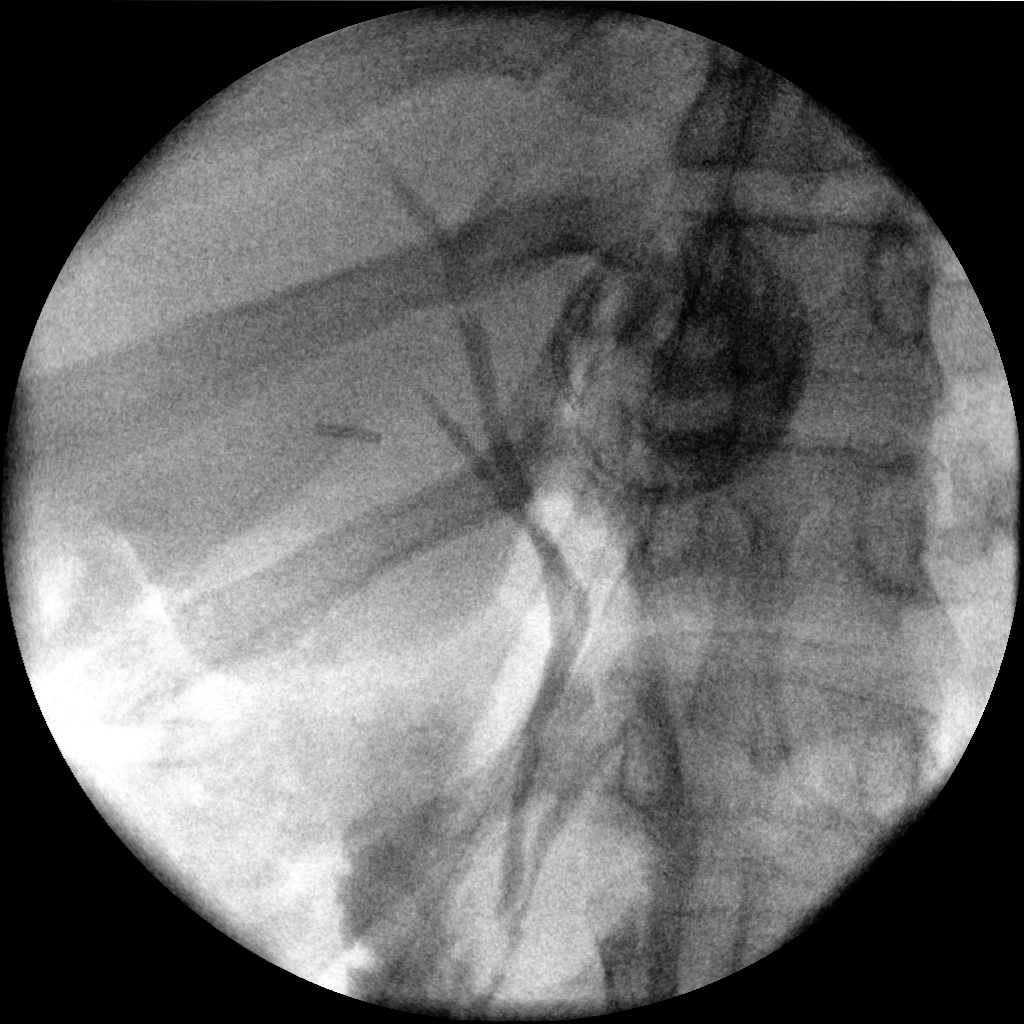

[Series 2: cont. · 1 of 1 slices shown (2 of 2)]
[im 1/1]
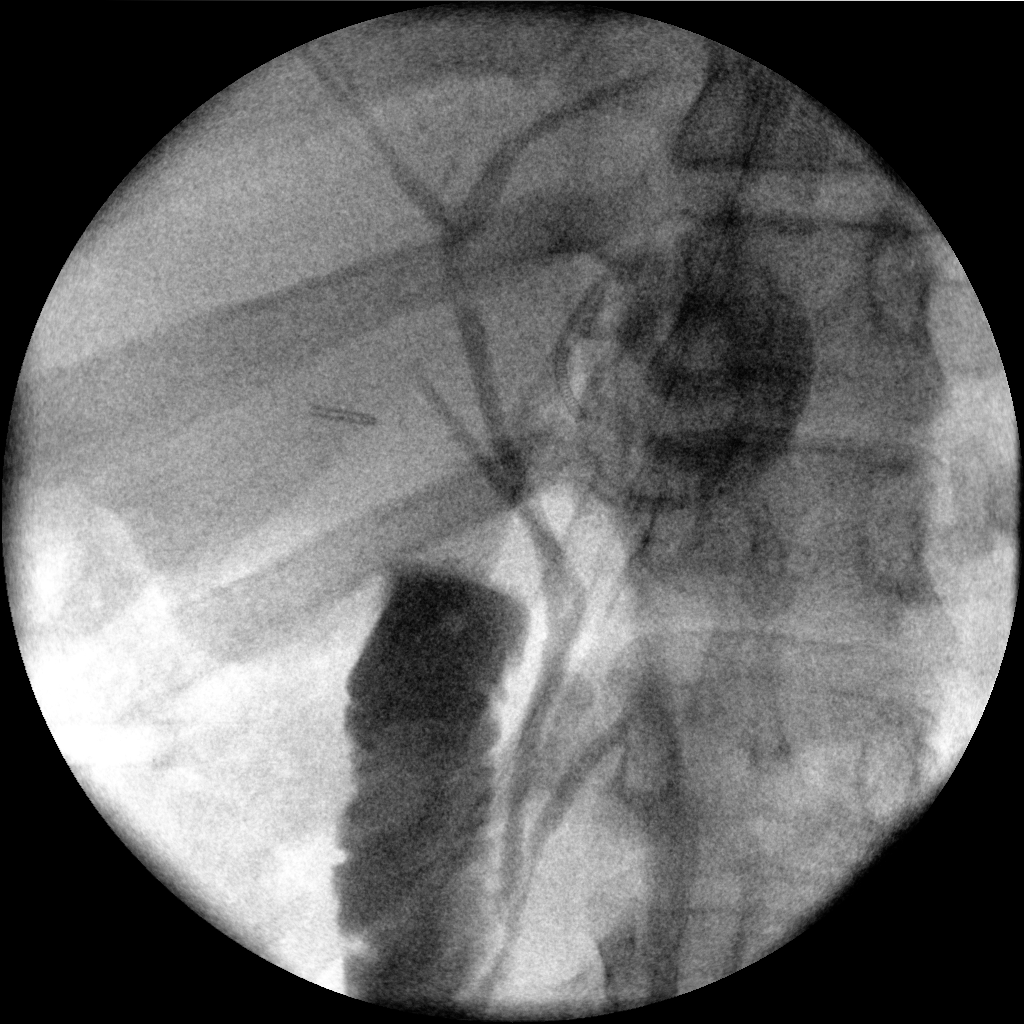

[2 of 2 positions shown; findings below may reference images not displayed]

FINDINGS: A single spot intraoperative cholangiographic image of the right
upper abdominal quadrant during laparoscopic cholecystectomy are
provided for review.

Surgical clips overlie the expected location of the gallbladder
fossa.

Contrast injection demonstrates selective cannulation of the central
aspect of the cystic duct.

There is passage of contrast through the central aspect of the
cystic duct with filling of a non dilated common bile duct. There is
passage of contrast though the CBD and into the descending portion
of the duodenum.

There is minimal reflux of injected contrast into the common hepatic
duct and central aspect of the non dilated intrahepatic biliary
system. There is minimal opacification the central aspect of the
pancreatic duct which appears nondilated.

There are no discrete filling defects within the opacified portions
of the biliary system to suggest the presence of
choledocholithiasis, though note, the distal aspect of the CBD is
not well opacified.
IMPRESSION: No definite evidence of choledocholithiasis.

## 2017-06-21 ENCOUNTER — Ambulatory Visit: Payer: Medicaid Other | Admitting: Family Medicine

## 2017-06-27 ENCOUNTER — Encounter: Payer: Self-pay | Admitting: Family Medicine

## 2017-06-27 ENCOUNTER — Ambulatory Visit: Payer: Medicaid Other | Admitting: Family Medicine

## 2017-06-27 VITALS — BP 126/74 | HR 93 | Temp 98.0°F | Resp 14 | Wt 230.8 lb

## 2017-06-27 DIAGNOSIS — L83 Acanthosis nigricans: Secondary | ICD-10-CM | POA: Diagnosis not present

## 2017-06-27 DIAGNOSIS — R1030 Lower abdominal pain, unspecified: Secondary | ICD-10-CM | POA: Diagnosis not present

## 2017-06-27 DIAGNOSIS — N3000 Acute cystitis without hematuria: Secondary | ICD-10-CM | POA: Diagnosis not present

## 2017-06-27 DIAGNOSIS — E119 Type 2 diabetes mellitus without complications: Secondary | ICD-10-CM

## 2017-06-27 DIAGNOSIS — M545 Low back pain: Secondary | ICD-10-CM | POA: Diagnosis not present

## 2017-06-27 LAB — POCT URINALYSIS DIPSTICK
Bilirubin, UA: NEGATIVE
Blood, UA: NEGATIVE
Ketones, UA: NEGATIVE
Nitrite, UA: NEGATIVE
Odor: NEGATIVE
Protein, UA: NEGATIVE
Spec Grav, UA: 1.015 (ref 1.010–1.025)
Urobilinogen, UA: 0.2 U/dL
pH, UA: 5 (ref 5.0–8.0)

## 2017-06-27 MED ORDER — NITROFURANTOIN MONOHYD MACRO 100 MG PO CAPS: 100.0000 mg | ORAL_CAPSULE | Freq: Two times a day (BID) | ORAL | 0 refills | Status: DC

## 2017-06-27 NOTE — Assessment & Plan Note (Signed)
Explained finding, insulin resistance; weight loss is the key

## 2017-06-27 NOTE — Progress Notes (Signed)
BP 126/74   Pulse 93   Temp 98 F (36.7 C) (Oral)   Resp 14   Wt 230 lb 12.8 oz (104.7 kg)   LMP 03/27/2017   SpO2 99%   BMI 34.08 kg/m    Subjective:    Patient ID: Penny Gonzalez, female    DOB: 1995-11-13, 22 y.o.   MRN: [id number]  HPI: Penny Gonzalez is a 22 y.o. female  Chief Complaint  Patient presents with  . Rash    inside of thighs with pt states black bumps  . Urinary Tract Infection    back and abdominal pain   HPI Patient thinks she may have a bladder infection She has had back and abdominal pain; see abnormal urine  She has a rash on the inside of her thighs with black bumps; a few on the left side, one on the right  She has diabetes Lab Results  Component Value Date   HGBA1C 6.5 (H) 02/22/2017    Depression screen PHQ 2/9 05/12/2017 03/21/2017 12/27/2016 10/14/2016 10/14/2016  Decreased Interest 2 0 0 0 0  Down, Depressed, Hopeless 2 0 0 1 -  PHQ - 2 Score 4 0 0 1 0  Altered sleeping 1 - - - -  Tired, decreased energy 2 - - - -  Change in appetite 2 - - - -  Feeling bad or failure about yourself  0 - - - -  Trouble concentrating 1 - - - -  Moving slowly or fidgety/restless 0 - - - -  Suicidal thoughts 0 - - - -  PHQ-9 Score 10 - - - -  Difficult doing work/chores Somewhat difficult - - - -    Relevant past medical, surgical, family and social history reviewed Past Medical History:  Diagnosis Date  . Anemia, iron deficiency, inadequate dietary intake 12/01/2014  . Bronchitis 04/2016   dr Bradlee Bridgers cleared pt medically for surgery after cxr results were normal (05-16-16)  . Cold 05/12/2016   RESOLVING PER PT  . Depression    patient has not been officially diagnosed but has symptoms  . Diabetes mellitus without complication (HCC)   . Essential hypertension, benign 03/31/2016  . Irregular periods/menstrual cycles   . Joint pain of leg   . Major depression 11/23/2015   Past Surgical History:  Procedure Laterality Date  . CHOLECYSTECTOMY  N/A 05/19/2016   Procedure: LAPAROSCOPIC CHOLECYSTECTOMY WITH INTRAOPERATIVE CHOLANGIOGRAM;  Surgeon: Nadeen Landau, MD;  Location: [facility] ORS;  Service: General;  Laterality: N/A;   Family History  Problem Relation Age of Onset  . Diabetes Mother   . Arthritis Father   . Diabetes Father   . Heart disease Father   . Hypertension Father   . Stroke Father   . Diabetes Maternal Grandmother   . Hypertension Maternal Grandmother   . Heart disease Paternal Grandmother   . Hypertension Paternal Grandmother   . Lupus Sister   . Deafness Sister    Social History   Tobacco Use  . Smoking status: Never Smoker  . Smokeless tobacco: Never Used  Substance Use Topics  . Alcohol use: Yes    Alcohol/week: 0.0 oz    Comment: OCC  . Drug use: No    Interim medical history since last visit reviewed. Allergies and medications reviewed  Review of Systems Per HPI unless specifically indicated above     Objective:    BP 126/74   Pulse 93   Temp 98 F (36.7 C) (Oral)  Resp 14   Wt 230 lb 12.8 oz (104.7 kg)   LMP 03/27/2017   SpO2 99%   BMI 34.08 kg/m   Wt Readings from Last 3 Encounters:  06/27/17 230 lb 12.8 oz (104.7 kg)  05/12/17 230 lb (104.3 kg)  04/10/17 229 lb (103.9 kg)    Physical Exam  Constitutional: She appears well-developed and well-nourished.  HENT:  Mouth/Throat: Mucous membranes are normal.  Eyes: EOM are normal. No scleral icterus.  Cardiovascular: Normal rate and regular rhythm.  Pulmonary/Chest: Effort normal and breath sounds normal.  Genitourinary:     Genitourinary Comments: A few hyperpigmented maculopapular spots on the upper inner thighs near inguinal crease; no fluctuance, no drainage; appear consistent with healing or old infected hairs or shaving cuts; velvety hyperpigmented skin in the folds  Skin:  Thickened, hyperpigmented skin around the nape of the neck with sparing of the creases consistent with acanthosis nigricans  Psychiatric: She  has a normal mood and affect. Her behavior is normal. Her mood appears not anxious. She does not exhibit a depressed mood.   Diabetic Foot Form - Detailed   Diabetic Foot Exam - detailed Diabetic Foot exam was performed with the following findings:  Yes 06/27/2017 11:44 AM  Visual Foot Exam completed.:  Yes  Pulse Foot Exam completed.:  Yes  Right Dorsalis Pedis:  Present Left Dorsalis Pedis:  Present  Sensory Foot Exam Completed.:  Yes Semmes-Weinstein Monofilament Test R Site 1-Great Toe:  Pos L Site 1-Great Toe:  Pos         Results for orders placed or performed in visit on 06/27/17  POCT urinalysis dipstick  Result Value Ref Range   Color, UA yellow    Clarity, UA clear    Glucose, UA     Bilirubin, UA neg    Ketones, UA neg    Spec Grav, UA 1.015 1.010 - 1.025   Blood, UA neg    pH, UA 5.0 5.0 - 8.0   Protein, UA neg    Urobilinogen, UA 0.2 0.2 or 1.0 E.U./dL   Nitrite, UA neg    Leukocytes, UA Large (3+) (A) Negative   Appearance clear    Odor neg       Assessment & Plan:   Problem List Items Addressed This Visit      Endocrine   Diabetes mellitus type 2, controlled (HCC) (Chronic)    Foot exam by MD        Musculoskeletal and Integument   Acanthosis nigricans    Explained finding, insulin resistance; weight loss is the key       Other Visit Diagnoses    Low back pain, unspecified back pain laterality, unspecified chronicity, with sciatica presence unspecified    -  Primary   Relevant Orders   POCT urinalysis dipstick (Completed)   Urine Culture   Lower abdominal pain       Relevant Orders   POCT urinalysis dipstick (Completed)   Urine Culture   Acute cystitis without hematuria       start antibiotic; culture pending       Follow up plan: No Follow-up on file.  An after-visit summary was printed and given to the patient at check-out.  Please see the patient instructions which may contain other information and recommendations beyond what is  mentioned above in the assessment and plan.  Meds ordered this encounter  Medications  . nitrofurantoin, macrocrystal-monohydrate, (MACROBID) 100 MG capsule    Sig: Take 1 capsule (100 mg  total) by mouth 2 (two) times daily.    Dispense:  10 capsule    Refill:  0    Orders Placed This Encounter  Procedures  . Urine Culture  . POCT urinalysis dipstick

## 2017-06-27 NOTE — Patient Instructions (Signed)
Start the antibiotics for your bladder infection I'll see you in March for your next diabetes visit Let me know if there is anything I can do before your next appointment

## 2017-06-27 NOTE — Assessment & Plan Note (Signed)
Foot exam by MD 

## 2017-06-28 LAB — URINE CULTURE: ORGANISM ID, BACTERIA: NO GROWTH

## 2017-07-30 NOTE — Progress Notes (Signed)
Closing out lab/order note open since:  2018 

## 2017-08-13 ENCOUNTER — Emergency Department
Admission: EM | Admit: 2017-08-13 | Discharge: 2017-08-13 | Disposition: A | Discharge status: Home / Self Care | Payer: Medicaid Other | Attending: Emergency Medicine | Admitting: Emergency Medicine

## 2017-08-13 ENCOUNTER — Other Ambulatory Visit: Payer: Self-pay

## 2017-08-13 ENCOUNTER — Encounter: Payer: Self-pay | Admitting: Emergency Medicine

## 2017-08-13 DIAGNOSIS — I10 Essential (primary) hypertension: Secondary | ICD-10-CM | POA: Insufficient documentation

## 2017-08-13 DIAGNOSIS — Z7984 Long term (current) use of oral hypoglycemic drugs: Secondary | ICD-10-CM | POA: Insufficient documentation

## 2017-08-13 DIAGNOSIS — R55 Syncope and collapse: Secondary | ICD-10-CM | POA: Insufficient documentation

## 2017-08-13 DIAGNOSIS — E119 Type 2 diabetes mellitus without complications: Secondary | ICD-10-CM | POA: Insufficient documentation

## 2017-08-13 DIAGNOSIS — Z79899 Other long term (current) drug therapy: Secondary | ICD-10-CM | POA: Insufficient documentation

## 2017-08-13 LAB — URINALYSIS, COMPLETE (UACMP) WITH MICROSCOPIC
Bilirubin Urine: NEGATIVE
GLUCOSE, UA: NEGATIVE mg/dL
Hgb urine dipstick: NEGATIVE
Ketones, ur: NEGATIVE mg/dL
Leukocytes, UA: NEGATIVE
NITRITE: NEGATIVE
PH: 6 (ref 5.0–8.0)
Protein, ur: 30 mg/dL — AB
SPECIFIC GRAVITY, URINE: 1.025 (ref 1.005–1.030)

## 2017-08-13 LAB — POCT PREGNANCY, URINE: Preg Test, Ur: NEGATIVE

## 2017-08-13 LAB — BASIC METABOLIC PANEL
ANION GAP: 8 (ref 5–15)
BUN: 8 mg/dL (ref 6–20)
CALCIUM: 8.9 mg/dL (ref 8.9–10.3)
CO2: 27 mmol/L (ref 22–32)
Chloride: 105 mmol/L (ref 101–111)
Creatinine, Ser: 0.59 mg/dL (ref 0.44–1.00)
GFR calc Af Amer: >60 mL/min (ref >60)
GLUCOSE: 135 mg/dL — AB (ref 65–99)
Potassium: 3.8 mmol/L (ref 3.5–5.1)
Sodium: 140 mmol/L (ref 135–145)

## 2017-08-13 LAB — CBC
HCT: 38.3 % (ref 35.0–47.0)
HEMOGLOBIN: 12.5 g/dL (ref 12.0–16.0)
MCH: 27.4 pg (ref 26.0–34.0)
MCHC: 32.5 g/dL (ref 32.0–36.0)
MCV: 84 fL (ref 80.0–100.0)
Platelets: 255 10*3/uL (ref 150–440)
RBC: 4.56 MIL/uL (ref 3.80–5.20)
RDW: 14.3 % (ref 11.5–14.5)
WBC: 6.6 10*3/uL (ref 3.6–11.0)

## 2017-08-13 LAB — HCG, QUANTITATIVE, PREGNANCY: hCG, Beta Chain, Quant, S: 1 m[IU]/mL (ref <5)

## 2017-08-13 NOTE — Discharge Instructions (Signed)
Please seek medical attention for any high fevers, chest pain, shortness of breath, change in behavior, persistent vomiting, bloody stool or any other new or concerning symptoms.  

## 2017-08-13 NOTE — ED Notes (Signed)
Pt states that she had near-syncopal episode after being at work for 4 hours and not eating.  Pt is a known type 2 diabetic and is on metformin for that.  Pt ambulatory for triage, breathing even and non-labored.   Pt is A&Ox4, in NAD.

## 2017-08-13 NOTE — ED Notes (Signed)
Pt discharged to home.  Family member driving.  Discharge instructions reviewed.  Verbalized understanding.  No questions or concerns at this time.  Teach back verified.  Pt in NAD.  No items left in ED.   

## 2017-08-13 NOTE — ED Provider Notes (Signed)
[facility] Emergency Department Provider Note  ____________________________________________   I have reviewed the triage vital signs and the nursing notes.   HISTORY  Chief Complaint Near Syncope   History limited by: Not Limited   HPI Penny Gonzalez is a 22 y.o. female who presents to the emergency department today because of a near syncopal episode that occurred while the patient was at work.  Patient states that it was around 3:00 when it happened.  She had not eaten earlier in the day.  She states she just got into the break room to try to eat when she sat down and started feeling lightheaded.  She felt like her heart was racing.  She noticed some change in her hearing.  There was a coworker in the room with her so she did not fall.  Patient denies actually losing consciousness.  This is the first time this is happened for the patient.  She denies any recurrent chest pain.  Per medical record review patient has a history of DM  Past Medical History:  Diagnosis Date  . Anemia, iron deficiency, inadequate dietary intake 12/01/2014  . Bronchitis 04/2016   dr lada cleared pt medically for surgery after cxr results were normal (05-16-16)  . Cold 05/12/2016   RESOLVING PER PT  . Depression    patient has not been officially diagnosed but has symptoms  . Diabetes mellitus without complication (HCC)   . Essential hypertension, benign 03/31/2016  . Irregular periods/menstrual cycles   . Joint pain of leg   . Major depression 11/23/2015    Patient Active Problem List   Diagnosis Date Noted  . Acanthosis nigricans 06/27/2017  . Obesity (BMI 30.0-34.9) 03/21/2017  . Latex allergy, contact dermatitis 01/13/2017  . Dermatitis 12/30/2016  . Bacterial vaginosis 10/14/2016  . Allergic rhinitis 09/23/2016  . Fever blister 08/21/2016  . Fatty liver 05/07/2016  . Foot pain, bilateral 03/31/2016  . Essential hypertension, benign 03/31/2016  . Proteinuria  01/30/2016  . Major depression 11/23/2015  . Vitamin D deficiency 11/19/2015  . Preventative health care 10/27/2015  . Medication monitoring encounter 10/27/2015  . Constipation, slow transit 02/18/2015  . Tonsil stone 02/18/2015  . Diabetes mellitus type 2, controlled (HCC) 11/26/2014  . Headache disorder 11/26/2014  . Arthralgia of multiple joints 11/26/2014    Past Surgical History:  Procedure Laterality Date  . CHOLECYSTECTOMY N/A 05/19/2016   Procedure: LAPAROSCOPIC CHOLECYSTECTOMY WITH INTRAOPERATIVE CHOLANGIOGRAM;  Surgeon: Nadeen Landau, MD;  Location: [facility] ORS;  Service: General;  Laterality: N/A;    Prior to Admission medications   Medication Sig Start Date End Date Taking? Authorizing Provider  medroxyPROGESTERone (PROVERA) 10 MG tablet Take 10 mg by mouth daily. 03/17/17   [provider]  metFORMIN (GLUCOPHAGE-XR) 500 MG 24 hr tablet Take 1 tablet (500 mg total) by mouth daily. 05/12/17   [name], MD  nitrofurantoin, macrocrystal-monohydrate, (MACROBID) 100 MG capsule Take 1 capsule (100 mg total) by mouth 2 (two) times daily. 06/27/17   [name], MD  norgestimate-ethinyl estradiol (ORTHO-CYCLEN,SPRINTEC,PREVIFEM) 0.25-35 MG-MCG tablet Take 1 tablet by mouth daily.  03/17/17   [provider]  nystatin (MYCOSTATIN/NYSTOP) powder Apply topically 2 (two) times daily as needed. 06/21/17   [provider]  triamcinolone ointment (KENALOG) 0.1 % Apply to hives up to twice daily as needed. 03/07/17   [provider]    Allergies Latex  Family History  Problem Relation Age of Onset  . Diabetes Mother   .  Arthritis Father   . Diabetes Father   . Heart disease Father   . Hypertension Father   . Stroke Father   . Diabetes Maternal Grandmother   . Hypertension Maternal Grandmother   . Heart disease Paternal Grandmother   . Hypertension Paternal Grandmother   . Lupus Sister   . Deafness Sister     Social History Social  History   Tobacco Use  . Smoking status: Never Smoker  . Smokeless tobacco: Never Used  Substance Use Topics  . Alcohol use: Yes    Alcohol/week: 0.0 oz    Comment: OCC  . Drug use: No    Review of Systems Constitutional: No fever/chills Eyes: No visual changes. ENT: No sore throat. Cardiovascular: Felt like her heart was racing. Respiratory: Denies shortness of breath. Gastrointestinal: No abdominal pain.  No nausea, no vomiting.  No diarrhea.   Genitourinary: Negative for dysuria. Musculoskeletal: Negative for back pain. Skin: Negative for rash. Neurological: Lightheadedness, dizziness.  ____________________________________________   PHYSICAL EXAM:  VITAL SIGNS: ED Triage Vitals  Enc Vitals Group     BP 08/13/17 1656 134/85     Pulse Rate 08/13/17 1656 80     Resp 08/13/17 1656 20     Temp 08/13/17 1656 98.7 F (37.1 C)     Temp Source 08/13/17 1656 Oral     SpO2 08/13/17 1652 100 %     Weight 08/13/17 1656 230 lb (104.3 kg)     Height 08/13/17 1656 5\' 9"  (1.753 m)     Head Circumference --      Peak Flow --      Pain Score 08/13/17 1656 7   Constitutional: Alert and oriented. Well appearing and in no distress. Eyes: Conjunctivae are normal.  ENT   Head: Normocephalic and atraumatic.   Nose: No congestion/rhinnorhea.   Mouth/Throat: Mucous membranes are moist.   Neck: No stridor. Hematological/Lymphatic/Immunilogical: No cervical lymphadenopathy. Cardiovascular: Normal rate, regular rhythm.  No murmurs, rubs, or gallops.  Respiratory: Normal respiratory effort without tachypnea nor retractions. Breath sounds are clear and equal bilaterally. No wheezes/rales/rhonchi. Gastrointestinal: Soft and non tender. No rebound. No guarding.  Genitourinary: Deferred Musculoskeletal: Normal range of motion in all extremities. No lower extremity edema. Neurologic:  Normal speech and language. No gross focal neurologic deficits are appreciated.  Skin:  Skin is  warm, dry and intact. No rash noted. Psychiatric: Mood and affect are normal. Speech and behavior are normal. Patient exhibits appropriate insight and judgment.  ____________________________________________    LABS (pertinent positives/negatives)  Upreg negative CBC wnl BMP glu 135, k 3.8, na 140  ____________________________________________   EKG  I, Phineas SemenGraydon Dontell Mian, attending physician, personally viewed and interpreted this EKG  EKG Time: 1702 Rate: 80 Rhythm: normal sinus rhythm Axis: normal Intervals: qtc 412 QRS: narrow ST changes: no st elevation Impression: normal ekg  ____________________________________________    RADIOLOGY  None  ____________________________________________   PROCEDURES  Procedures  ____________________________________________   INITIAL IMPRESSION / ASSESSMENT AND PLAN / ED COURSE  Pertinent labs & imaging results that were available during my care of the patient were reviewed by me and considered in my medical decision making (see chart for details).  Patient presented to the emergency department today because of concerns for a near syncopal episode at work. EKG and blood work here without concerning findings. The patient had not eaten earlier in the day. At this point I think likely vasovagal or low blood sugar. Patient did feel better at the time of my  exam. Will discharge. Discussed importance of PCP follow up.  ____________________________________________   FINAL CLINICAL IMPRESSION(S) / ED DIAGNOSES  Final diagnoses:  Near syncope     Note: This dictation was prepared with Dragon dictation. Any transcriptional errors that result from this process are unintentional     Phineas Semen, MD 08/13/17 [phone]

## 2017-08-13 NOTE — ED Triage Notes (Signed)
Pt arrived via EMS from work. Pt states she was at work for about 4 hours without eating and states she went to go take a break and sat down when the room started spinning and felt like she was going to fall.  Pt is a type 2 diabetic-takes metformin and has not taken medication today.

## 2017-08-23 ENCOUNTER — Encounter: Payer: Self-pay | Admitting: Family Medicine

## 2017-08-23 ENCOUNTER — Ambulatory Visit: Payer: Medicaid Other | Admitting: Family Medicine

## 2017-08-23 VITALS — BP 128/82 | HR 94 | Temp 98.4°F | Ht 69.0 in | Wt 231.4 lb

## 2017-08-23 DIAGNOSIS — E669 Obesity, unspecified: Secondary | ICD-10-CM | POA: Diagnosis not present

## 2017-08-23 DIAGNOSIS — R55 Syncope and collapse: Secondary | ICD-10-CM

## 2017-08-23 DIAGNOSIS — E559 Vitamin D deficiency, unspecified: Secondary | ICD-10-CM | POA: Diagnosis not present

## 2017-08-23 DIAGNOSIS — Z113 Encounter for screening for infections with a predominantly sexual mode of transmission: Secondary | ICD-10-CM | POA: Diagnosis not present

## 2017-08-23 DIAGNOSIS — F3341 Major depressive disorder, recurrent, in partial remission: Secondary | ICD-10-CM

## 2017-08-23 DIAGNOSIS — N898 Other specified noninflammatory disorders of vagina: Secondary | ICD-10-CM

## 2017-08-23 DIAGNOSIS — E119 Type 2 diabetes mellitus without complications: Secondary | ICD-10-CM

## 2017-08-23 DIAGNOSIS — N938 Other specified abnormal uterine and vaginal bleeding: Secondary | ICD-10-CM | POA: Insufficient documentation

## 2017-08-23 DIAGNOSIS — I10 Essential (primary) hypertension: Secondary | ICD-10-CM | POA: Diagnosis not present

## 2017-08-23 MED ORDER — METFORMIN HCL ER 500 MG PO TB24: 500.0000 mg | ORAL_TABLET | Freq: Every day | ORAL | 5 refills | Status: DC

## 2017-08-23 MED ORDER — VITAMIN D 50 MCG (2000 UT) PO TABS
2000.0000 [IU] | ORAL_TABLET | Freq: Every day | ORAL | 3 refills | Status: DC
Start: 2017-08-23 — End: 2017-12-26

## 2017-08-23 NOTE — Assessment & Plan Note (Signed)
Continue counseling, it has been working well.

## 2017-08-23 NOTE — Assessment & Plan Note (Signed)
Will re-order metformin since she cannot find hers and will recheck A1C today. Discussed diet and exercise. Will monitor kidney function.

## 2017-08-23 NOTE — Assessment & Plan Note (Signed)
Continue and increase healthy eating and exercise.

## 2017-08-23 NOTE — Assessment & Plan Note (Signed)
Continue to monitor discussed salt reduction in diet.

## 2017-08-23 NOTE — Assessment & Plan Note (Signed)
Patient states never started taking OTC vitamin D, Rx sent and patient willing to take. Encouraged outdoor activities.

## 2017-08-23 NOTE — Patient Instructions (Signed)
You should always hear about lab work if you do not get an update within a week please call the office. It was so great meeting you!  Diabetes Mellitus and Nutrition When you have diabetes (diabetes mellitus), it is very important to have healthy eating habits because your blood sugar (glucose) levels are greatly affected by what you eat and drink. Eating healthy foods in the appropriate amounts, at about the same times every day, can help you:  Control your blood glucose.  Lower your risk of heart disease.  Improve your blood pressure.  Reach or maintain a healthy weight.  Every person with diabetes is different, and each person has different needs for a meal plan. Your health care provider may recommend that you work with a diet and nutrition specialist (dietitian) to make a meal plan that is best for you. Your meal plan may vary depending on factors such as:  The calories you need.  The medicines you take.  Your weight.  Your blood glucose, blood pressure, and cholesterol levels.  Your activity level.  Other health conditions you have, such as heart or kidney disease.  How do carbohydrates affect me? Carbohydrates affect your blood glucose level more than any other type of food. Eating carbohydrates naturally increases the amount of glucose in your blood. Carbohydrate counting is a method for keeping track of how many carbohydrates you eat. Counting carbohydrates is important to keep your blood glucose at a healthy level, especially if you use insulin or take certain oral diabetes medicines. It is important to know how many carbohydrates you can safely have in each meal. This is different for every person. Your dietitian can help you calculate how many carbohydrates you should have at each meal and for snack. Foods that contain carbohydrates include:  Bread, cereal, rice, pasta, and crackers.  Potatoes and corn.  Peas, beans, and lentils.  Milk and yogurt.  Fruit and  juice.  Desserts, such as cakes, cookies, ice cream, and candy.  How does alcohol affect me? Alcohol can cause a sudden decrease in blood glucose (hypoglycemia), especially if you use insulin or take certain oral diabetes medicines. Hypoglycemia can be a life-threatening condition. Symptoms of hypoglycemia (sleepiness, dizziness, and confusion) are similar to symptoms of having too much alcohol. If your health care provider says that alcohol is safe for you, follow these guidelines:  Limit alcohol intake to no more than 1 drink per day for nonpregnant women and 2 drinks per day for men. One drink equals 12 oz of beer, 5 oz of wine, or 1 oz of hard liquor.  Do not drink on an empty stomach.  Keep yourself hydrated with water, diet soda, or unsweetened iced tea.  Keep in mind that regular soda, juice, and other mixers may contain a lot of sugar and must be counted as carbohydrates.  What are tips for following this plan? Reading food labels  Start by checking the serving size on the label. The amount of calories, carbohydrates, fats, and other nutrients listed on the label are based on one serving of the food. Many foods contain more than one serving per package.  Check the total grams (g) of carbohydrates in one serving. You can calculate the number of servings of carbohydrates in one serving by dividing the total carbohydrates by 15. For example, if a food has 30 g of total carbohydrates, it would be equal to 2 servings of carbohydrates.  Check the number of grams (g) of saturated and trans fats  in one serving. Choose foods that have low or no amount of these fats.  Check the number of milligrams (mg) of sodium in one serving. Most people should limit total sodium intake to less than 2,300 mg per day.  Always check the nutrition information of foods labeled as "low-fat" or "nonfat". These foods may be higher in added sugar or refined carbohydrates and should be avoided.  Talk to your  dietitian to identify your daily goals for nutrients listed on the label. Shopping  Avoid buying canned, premade, or processed foods. These foods tend to be high in fat, sodium, and added sugar.  Shop around the outside edge of the grocery store. This includes fresh fruits and vegetables, bulk grains, fresh meats, and fresh dairy. Cooking  Use low-heat cooking methods, such as baking, instead of high-heat cooking methods like deep frying.  Cook using healthy oils, such as olive, canola, or sunflower oil.  Avoid cooking with butter, cream, or high-fat meats. Meal planning  Eat meals and snacks regularly, preferably at the same times every day. Avoid going long periods of time without eating.  Eat foods high in fiber, such as fresh fruits, vegetables, beans, and whole grains. Talk to your dietitian about how many servings of carbohydrates you can eat at each meal.  Eat 4-6 ounces of lean protein each day, such as lean meat, chicken, fish, eggs, or tofu. 1 ounce is equal to 1 ounce of meat, chicken, or fish, 1 egg, or 1/4 cup of tofu.  Eat some foods each day that contain healthy fats, such as avocado, nuts, seeds, and fish. Lifestyle   Check your blood glucose regularly.  Exercise at least 30 minutes 5 or more days each week, or as told by your health care provider.  Take medicines as told by your health care provider.  Do not use any products that contain nicotine or tobacco, such as cigarettes and e-cigarettes. If you need help quitting, ask your health care provider.  Work with a Social worker or diabetes educator to identify strategies to manage stress and any emotional and social challenges. What are some questions to ask my health care provider?  Do I need to meet with a diabetes educator?  Do I need to meet with a dietitian?  What number can I call if I have questions?  When are the best times to check my blood glucose? Where to find more information:  American Diabetes  Association: diabetes.org/food-and-fitness/food  Academy of Nutrition and Dietetics: PokerClues.dk  [employer] of Diabetes and Digestive and Kidney Diseases (NIH): ContactWire.be Summary  A healthy meal plan will help you control your blood glucose and maintain a healthy lifestyle.  Working with a diet and nutrition specialist (dietitian) can help you make a meal plan that is best for you.  Keep in mind that carbohydrates and alcohol have immediate effects on your blood glucose levels. It is important to count carbohydrates and to use alcohol carefully. This information is not intended to replace advice given to you by your health care provider. Make sure you discuss any questions you have with your health care provider. Document Released: 02/24/2005 Document Revised: 07/04/2016 Document Reviewed: 07/04/2016 Elsevier Interactive Patient Education  [employer].

## 2017-08-23 NOTE — Progress Notes (Signed)
BP 128/82 (BP Location: Left Arm, Patient Position: Sitting, Cuff Size: Large)   Pulse 94   Temp 98.4 F (36.9 C) (Oral)   Ht 5\' 9"  (1.753 m)   Wt 231 lb 6.4 oz (105 kg)   SpO2 99%   BMI 34.17 kg/m    Subjective:    Patient ID: Penny Gonzalez, female    DOB: December 08, 1995, 22 y.o.   MRN: [id number]  HPI: Penny Gonzalez is a 22 y.o. female  Chief Complaint  Patient presents with  . Follow-up    Pt request STD screening  . Diabetes  . Loss of Consciousness    Pt states she had recent syncopy episode at work     HPI ER Follow-up Patient was seen in ER on 08/13/2017 for near syncopal episode at work. Labs completed WNL, ED provider impression was likely due to hypoglycemia or vasovagal. Patient has not had any episodes since then. She has been snacking to prevent hypoglycemia. Denies dizziness, lightheadedness or cp. Pt sts she does feel fatigue and has been more tired for the past few weeks and sts hasnt been sleeping well- body is waking her up early and she cant go back to sleep after. Pt has nor personal history of thyroid problems- sts father got his thyroid removed. Last TSH 0.47 6 months ago.   Diabetes Mellitus/ Obesity  Patient lost metformin when she moved and has not been taking it since 07/24/17- diet has been okay; drinks water and green teas and juice here and there no sodas. Due to busy schedule has difficulty cooking so normally grabs frozen meals. Endorses polyphagia- states snacks throughout the day and still feels hungry; denies polydipsia or polyuria.   STD Screening Patient has unprotected sex with one partner who claims she is only partner but not in a relationship. States sometimes use condoms- its there at the house. Patient endorses "snot" like discharge without odor states was seen by OBGYN who noted it was normal discharge after seeing and testing it. Denies vaginal bleeding.   HTN Patient states it was through pregnancy but last week noted that she  was hypertensive- during near syncopal episode. EMS notes BP was in the 170's but it was normal again in the ER and here today. Patients doesn't add salt to foods but eats some processed and fried foods. Loves veggies.   Vitamin D deficiency Patient endorses fatigue as described earlier. Pt sts she never start the OTC vitamin D supplements but will start them tomorrow.   MDD Follows up [place]- a Child psychotherapist for counseling. Feels well controlled. Has not been on medications. Denies SI/HI    Depression screen [facility] 2/9 08/23/2017 05/12/2017 03/21/2017 12/27/2016 10/14/2016  Decreased Interest 0 2 0 0 0  Down, Depressed, Hopeless 0 2 0 0 1  PHQ - 2 Score 0 4 0 0 1  Altered sleeping - 1 - - -  Tired, decreased energy - 2 - - -  Change in appetite - 2 - - -  Feeling bad or failure about yourself  - 0 - - -  Trouble concentrating - 1 - - -  Moving slowly or fidgety/restless - 0 - - -  Suicidal thoughts - 0 - - -  PHQ-9 Score - 10 - - -  Difficult doing work/chores - Somewhat difficult - - -    Relevant past medical, surgical, family and social history reviewed Past Medical History:  Diagnosis Date  . Anemia, iron deficiency, inadequate dietary intake  12/01/2014  . Bronchitis 04/2016   dr Charlyne Robertshaw cleared pt medically for surgery after cxr results were normal (05-16-16)  . Cold 05/12/2016   RESOLVING PER PT  . Depression    patient has not been officially diagnosed but has symptoms  . Diabetes mellitus without complication (HCC)   . Essential hypertension, benign 03/31/2016  . Irregular periods/menstrual cycles   . Joint pain of leg   . Major depression 11/23/2015   Past Surgical History:  Procedure Laterality Date  . CHOLECYSTECTOMY N/A 05/19/2016   Procedure: LAPAROSCOPIC CHOLECYSTECTOMY WITH INTRAOPERATIVE CHOLANGIOGRAM;  Surgeon: Nadeen Landau, MD;  Location: [facility] ORS;  Service: General;  Laterality: N/A;   Family History  Problem Relation Age of Onset  . Diabetes Mother   .  Arthritis Father   . Diabetes Father   . Heart disease Father   . Hypertension Father   . Stroke Father   . Diabetes Maternal Grandmother   . Hypertension Maternal Grandmother   . Heart disease Paternal Grandmother   . Hypertension Paternal Grandmother   . Lupus Sister   . Deafness Sister    Social History   Tobacco Use  . Smoking status: Never Smoker  . Smokeless tobacco: Never Used  Substance Use Topics  . Alcohol use: Yes    Alcohol/week: 0.0 oz    Comment: OCC  . Drug use: No    Interim medical history since last visit reviewed. Allergies and medications reviewed  Review of Systems Per HPI unless specifically indicated above     Objective:    BP 128/82 (BP Location: Left Arm, Patient Position: Sitting, Cuff Size: Large)   Pulse 94   Temp 98.4 F (36.9 C) (Oral)   Ht 5\' 9"  (1.753 m)   Wt 231 lb 6.4 oz (105 kg)   SpO2 99%   BMI 34.17 kg/m   Wt Readings from Last 3 Encounters:  08/23/17 231 lb 6.4 oz (105 kg)  08/13/17 230 lb (104.3 kg)  06/27/17 230 lb 12.8 oz (104.7 kg)    Physical Exam  Constitutional: She is oriented to person, place, and time. She appears well-developed and well-nourished.  HENT:  Head: Normocephalic.  Nose: Nose normal.  Mouth/Throat: Oropharynx is clear and moist.  Eyes: Conjunctivae and EOM are normal. Pupils are equal, round, and reactive to light.  Neck: Normal range of motion. No thyromegaly present.  Cardiovascular: Normal rate and normal heart sounds.  Pulmonary/Chest: Effort normal and breath sounds normal.  Abdominal: Soft. Bowel sounds are normal. There is no tenderness.  Genitourinary: Vagina normal and uterus normal. Cervix exhibits friability (mild).    Musculoskeletal: Normal range of motion.  Neurological: She is oriented to person, place, and time.  Skin: Skin is warm and dry.  Psychiatric: She has a normal mood and affect. Her behavior is normal. Judgment and thought content normal.   Diabetic Foot Form -  Detailed   Diabetic Foot Exam - detailed Diabetic Foot exam was performed with the following findings:  Yes 08/23/2017 12:03 PM  Visual Foot Exam completed.:  Yes  Pulse Foot Exam completed.:  Yes  Right Dorsalis Pedis:  Present Left Dorsalis Pedis:  Present  Sensory Foot Exam Completed.:  Yes Semmes-Weinstein Monofilament Test R Site 1-Great Toe:  Pos L Site 1-Great Toe:  Pos         Results for orders placed or performed during the hospital encounter of 08/13/17  Basic metabolic panel  Result Value Ref Range   Sodium 140 135 - 145  mmol/L   Potassium 3.8 3.5 - 5.1 mmol/L   Chloride 105 101 - 111 mmol/L   CO2 27 22 - 32 mmol/L   Glucose, Bld 135 (H) 65 - 99 mg/dL   BUN 8 6 - 20 mg/dL   Creatinine, Ser 0.980.59 0.44 - 1.00 mg/dL   Calcium 8.9 8.9 - 11.910.3 mg/dL   GFR calc non Af Amer >60 >60 mL/min   GFR calc Af Amer >60 >60 mL/min   Anion gap 8 5 - 15  CBC  Result Value Ref Range   WBC 6.6 3.6 - 11.0 K/uL   RBC 4.56 3.80 - 5.20 MIL/uL   Hemoglobin 12.5 12.0 - 16.0 g/dL   HCT 14.738.3 82.935.0 - 56.247.0 %   MCV 84.0 80.0 - 100.0 fL   MCH 27.4 26.0 - 34.0 pg   MCHC 32.5 32.0 - 36.0 g/dL   RDW 13.014.3 86.511.5 - 78.414.5 %   Platelets 255 150 - 440 K/uL  Urinalysis, Complete w Microscopic  Result Value Ref Range   Color, Urine YELLOW (A) YELLOW   APPearance HAZY (A) CLEAR   Specific Gravity, Urine 1.025 1.005 - 1.030   pH 6.0 5.0 - 8.0   Glucose, UA NEGATIVE NEGATIVE mg/dL   Hgb urine dipstick NEGATIVE NEGATIVE   Bilirubin Urine NEGATIVE NEGATIVE   Ketones, ur NEGATIVE NEGATIVE mg/dL   Protein, ur 30 (A) NEGATIVE mg/dL   Nitrite NEGATIVE NEGATIVE   Leukocytes, UA NEGATIVE NEGATIVE   RBC / HPF 0-5 0 - 5 RBC/hpf   WBC, UA 6-30 0 - 5 WBC/hpf   Bacteria, UA RARE (A) NONE SEEN   Squamous Epithelial / LPF 0-5 (A) NONE SEEN   Mucus PRESENT   hCG, quantitative, pregnancy  Result Value Ref Range   hCG, Beta Chain, Quant, S 1 <5 mIU/mL  Pregnancy, urine POC  Result Value Ref Range   Preg Test, Ur  NEGATIVE NEGATIVE      Assessment & Plan:   Problem List Items Addressed This Visit      Cardiovascular and Mediastinum   Essential hypertension, benign (Chronic)    Continue to monitor discussed salt reduction in diet.         Endocrine   Diabetes mellitus type 2, controlled (HCC) (Chronic)    Will re-order metformin since she cannot find hers and will recheck A1C today. Discussed diet and exercise. Will monitor kidney function.       Relevant Medications   metFORMIN (GLUCOPHAGE-XR) 500 MG 24 hr tablet   Other Relevant Orders   Urine Microalbumin w/creat. ratio   Hemoglobin A1c   Lipid Profile     Other   Vitamin D deficiency    Patient states never started taking OTC vitamin D, Rx sent and patient willing to take. Encouraged outdoor activities.       Relevant Medications   Cholecalciferol (VITAMIN D) 2000 units tablet   Obesity (BMI 30.0-34.9)    Continue and increase healthy eating and exercise.      Major depression    Continue counseling, it has been working well.        Other Visit Diagnoses    Vaginal discharge    -  Primary   check labs   Relevant Orders   C. trachomatis/N. gonorrhoeae RNA   WET PREP BY MOLECULAR PROBE   Screen for STD (sexually transmitted disease)       check labs; discussed importance of safe sexual practices   Relevant Orders   C. trachomatis/N. gonorrhoeae  RNA   WET PREP BY MOLECULAR PROBE   Near syncope       patient hydrating, eating better; less stress; evaluated in ER; no further work-up       Follow up plan: No Follow-up on file. 1. Vaginal discharge Monitor, follow up with OBGYN as necessary  - C. trachomatis/N. gonorrhoeae RNA - WET PREP BY MOLECULAR PROBE  2. Screen for STD (sexually transmitted disease) Discussed importance of safe sex.  - C. trachomatis/N. gonorrhoeae RNA - WET PREP BY MOLECULAR PROBE  3. Controlled type 2 diabetes mellitus without complication, without long-term current use of insulin  (HCC) Start back on the metformin- pending results discuss possibility or ARB/ACI for nephroprotection.  - Urine Microalbumin w/creat. ratio - Hemoglobin A1c - metFORMIN (GLUCOPHAGE-XR) 500 MG 24 hr tablet; Take 1 tablet (500 mg total) by mouth daily.  Dispense: 30 tablet; Refill: 5 - Lipid Profile  4. Vitamin D deficiency Spend time outdoors as tolerated - Cholecalciferol (VITAMIN D) 2000 units tablet; Take 1 tablet (2,000 Units total) by mouth daily.  Dispense: 30 tablet; Refill: 3  5. Near syncope Discussed prevention of hypoglycemia and signs/symptoms  6. Essential hypertension, benign Monitor encourage DASH   7. Obesity (BMI 30.0-34.9) Discussed diet and exervcise  8. Recurrent major depressive disorder, in partial remission (HCC) Continue counseling  An after-visit summary was printed and given to the patient at check-out.  Please see the patient instructions which may contain other information and recommendations beyond what is mentioned above in the assessment and plan.  Meds ordered this encounter  Medications  . Cholecalciferol (VITAMIN D) 2000 units tablet    Sig: Take 1 tablet (2,000 Units total) by mouth daily.    Dispense:  30 tablet    Refill:  3    Order Specific Question:   Supervising Provider    Answer:   Alexandrya Chim, Janit Bern [id number]  . metFORMIN (GLUCOPHAGE-XR) 500 MG 24 hr tablet    Sig: Take 1 tablet (500 mg total) by mouth daily.    Dispense:  30 tablet    Refill:  5    Please use this one instead -- thank you! New instructions    Order Specific Question:   Supervising Provider    AnswerKerman Passey [[id number]]    Orders Placed This Encounter  Procedures  . C. trachomatis/N. gonorrhoeae RNA  . WET PREP BY MOLECULAR PROBE  . Urine Microalbumin w/creat. ratio  . Hemoglobin A1c  . Lipid Profile

## 2017-08-24 LAB — C. TRACHOMATIS/N. GONORRHOEAE RNA
C. trachomatis RNA, TMA: NOT DETECTED
N. gonorrhoeae RNA, TMA: NOT DETECTED

## 2017-08-24 LAB — WET PREP BY MOLECULAR PROBE
Candida species: NOT DETECTED
Gardnerella vaginalis: NOT DETECTED
MICRO NUMBER: 90321932
SPECIMEN QUALITY:: ADEQUATE
TRICHOMONAS VAG: NOT DETECTED

## 2017-08-24 LAB — HEMOGLOBIN A1C
Hgb A1c MFr Bld: 7.1 % of total Hgb — ABNORMAL HIGH (ref <5.7)
Mean Plasma Glucose: 157 (calc)
eAG (mmol/L): 8.7 (calc)

## 2017-08-24 LAB — MICROALBUMIN / CREATININE URINE RATIO
Creatinine, Urine: 173 mg/dL (ref 20–275)
MICROALB UR: 0.8 mg/dL
Microalb Creat Ratio: 5 mcg/mg creat (ref <30)

## 2017-10-17 ENCOUNTER — Telehealth: Payer: Self-pay

## 2017-10-17 NOTE — Telephone Encounter (Signed)
Patient needs evaluation, either MyChart visit or e-visit or urgent care or visit here Please contact her and triage her to get appropriate care; thank you

## 2017-10-17 NOTE — Telephone Encounter (Signed)
Copied from CRM [id number]. Topic: Appointment Scheduling - Scheduling Inquiry for Clinic >> Oct 17, 2017  3:37 PM Penny Gonzalez, NT wrote: Reason for CRM: Pt is wanting to be worked in for an appointment for cough, congestion, and feeling weak. She states she cannot wait until Friday to see Dr. Sherie Don. Please call pt.

## 2017-10-18 NOTE — Telephone Encounter (Signed)
Pt.notified

## 2017-11-14 ENCOUNTER — Telehealth: Payer: Self-pay | Admitting: Family Medicine

## 2017-11-14 NOTE — Telephone Encounter (Signed)
Please recommend she call her GYN or do an e-visit

## 2017-11-14 NOTE — Telephone Encounter (Signed)
Copied from CRM [phone]. Topic: Quick Communication - Rx Refill/Question >> Nov 14, 2017 12:16 PM Rudi CocoLathan, Chasity Outten M, NT wrote: Medication: metroNIDAZOLE (FLAGYL) 500 MG tablet [[id number]][[id number]]  Has the patient contacted their pharmacy? yes (Agent: If no, request that the patient contact the pharmacy for the refill.) (Agent: If yes, when and what did the pharmacy advise?)  Preferred Pharmacy (with phone number or street name): [facility] [address] - [place], [place] - [id number] [place] [address] Phone: [phone] Fax: [phone]    Agent: Please be advised that RX refills may take up to 3 business days. We ask that you follow-up with your pharmacy.

## 2017-11-14 NOTE — Telephone Encounter (Signed)
Pt.notified

## 2017-11-29 ENCOUNTER — Ambulatory Visit (INDEPENDENT_AMBULATORY_CARE_PROVIDER_SITE_OTHER): Payer: 59 | Admitting: Family Medicine

## 2017-11-29 ENCOUNTER — Encounter: Payer: Self-pay | Admitting: Family Medicine

## 2017-11-29 VITALS — BP 124/82 | HR 81 | Temp 98.3°F | Resp 12 | Ht 67.38 in | Wt 229.2 lb

## 2017-11-29 DIAGNOSIS — E1165 Type 2 diabetes mellitus with hyperglycemia: Secondary | ICD-10-CM | POA: Insufficient documentation

## 2017-11-29 DIAGNOSIS — I471 Supraventricular tachycardia: Secondary | ICD-10-CM

## 2017-11-29 DIAGNOSIS — Z0001 Encounter for general adult medical examination with abnormal findings: Secondary | ICD-10-CM | POA: Diagnosis not present

## 2017-11-29 DIAGNOSIS — E1169 Type 2 diabetes mellitus with other specified complication: Secondary | ICD-10-CM

## 2017-11-29 DIAGNOSIS — O99210 Obesity complicating pregnancy, unspecified trimester: Secondary | ICD-10-CM | POA: Insufficient documentation

## 2017-11-29 DIAGNOSIS — Z113 Encounter for screening for infections with a predominantly sexual mode of transmission: Secondary | ICD-10-CM | POA: Diagnosis not present

## 2017-11-29 DIAGNOSIS — N898 Other specified noninflammatory disorders of vagina: Secondary | ICD-10-CM | POA: Diagnosis not present

## 2017-11-29 DIAGNOSIS — E669 Obesity, unspecified: Secondary | ICD-10-CM

## 2017-11-29 DIAGNOSIS — Z0000 Encounter for general adult medical examination without abnormal findings: Secondary | ICD-10-CM

## 2017-11-29 HISTORY — DX: Obesity complicating pregnancy, unspecified trimester: O99.210

## 2017-11-29 NOTE — Assessment & Plan Note (Signed)
asymptomatic

## 2017-11-29 NOTE — Progress Notes (Signed)
Patient ID: Penny Gonzalez, female   DOB: [birth year], 22 y.o.   MRN: [id number]   Subjective:   Penny Gonzalez is a 22 y.o. 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  Not sure if busy stress is affecting sugars; having some dry mouth; last urine microalb:Cr was normal Lab Results  Component Value Date   HGBA1C 7.1 (H) 08/23/2017   Just started bleeding out of nowhere; brownish discharge; no odor; having unprotected sex; one person; asymptomatic  USPSTF grade A and B recommendations Depression:  Depression screen [facility] 2/9 11/29/2017 08/23/2017 05/12/2017 03/21/2017 12/27/2016  Decreased Interest 0 0 2 0 0  Down, Depressed, Hopeless 0 0 2 0 0  PHQ - 2 Score 0 0 4 0 0  Altered sleeping - - 1 - -  Tired, decreased energy - - 2 - -  Change in appetite - - 2 - -  Feeling bad or failure about yourself  - - 0 - -  Trouble concentrating - - 1 - -  Moving slowly or fidgety/restless - - 0 - -  Suicidal thoughts - - 0 - -  PHQ-9 Score - - 10 - -  Difficult doing work/chores - - Somewhat difficult - -   Hypertension: BP Readings from Last 3 Encounters:  11/29/17 124/82  08/23/17 128/82  08/13/17 130/80   Obesity: Wt Readings from Last 3 Encounters:  11/29/17 229 lb 3.2 oz (104 kg)  08/23/17 231 lb 6.4 oz (105 kg)  08/13/17 230 lb (104.3 kg)   BMI Readings from Last 3 Encounters:  11/29/17 35.50 kg/m  08/23/17 34.17 kg/m  08/13/17 33.97 kg/m    Skin cancer: moles, upper right chest, left arm Lung cancer:  nonsmoker Breast cancer: no recent lumps; nothing worrisome Colorectal cancer: no fam hx; start at age 56 Cervical cancer screening: UTD BRCA gene screening: family hx of breast and/or ovarian cancer and/or metastatic prostate cancer? Father had prostate cancer, removed; no  mets HIV, hep B, hep C: check labs STD testing and prevention (chl/gon/syphilis): check labs Intimate partner violence: no abuse Contraception: birth control, OCP Immunizations: UTD Diet: "I'm trying" Exercise: busy work schedule Alcohol: not weekly Tobacco use: nonsmoker Glucose:  Glucose  Date Value Ref Range Status  09/20/2014 144 (H) mg/dL Final    Comment:    65-99 NOTE: New Reference Range  08/19/14   07/04/2014 112 (H) 65 - 99 mg/dL Final  05/23/2014 85 65 - 99 mg/dL Final   Glucose, Bld  Date Value Ref Range Status  08/13/2017 135 (H) 65 - 99 mg/dL Final  02/22/2017 120 65 - 139 mg/dL Final    Comment:    .        Non-fasting reference interval .   11/20/2016 114 (H) 65 - 99 mg/dL Final   Glucose-Capillary  Date Value Ref Range Status  05/19/2016 133 (H) 65 - 99 mg/dL Final  05/19/2016 119 (H) 65 - 99 mg/dL Final   Lipids:  Lab Results  Component Value Date   CHOL 148 01/13/2017   CHOL 147 07/11/2016   CHOL 144 01/28/2016   Lab Results  Component Value Date   HDL 82 01/13/2017   HDL 58 07/11/2016   HDL 61 01/28/2016   Lab Results  Component Value Date   LDLCALC 45 01/13/2017  LDLCALC 59 07/11/2016   LDLCALC 64 01/28/2016   Lab Results  Component Value Date   TRIG 106 01/13/2017   TRIG 148 07/11/2016   TRIG 95 01/28/2016   Lab Results  Component Value Date   CHOLHDL 1.8 01/13/2017   CHOLHDL 2.5 07/11/2016   CHOLHDL 2.4 01/28/2016   No results found for: LDLDIRECT  Past Medical History:  Diagnosis Date  . Anemia, iron deficiency, inadequate dietary intake 12/01/2014  . Depression    patient has not been officially diagnosed but has symptoms  . Diabetes mellitus without complication ([place])   . Essential hypertension, benign 03/31/2016  . Irregular periods/menstrual cycles   . Joint pain of leg   . Major depression 11/23/2015   Past Surgical History:  Procedure Laterality Date  . CHOLECYSTECTOMY N/A 05/19/2016   Procedure:  LAPAROSCOPIC CHOLECYSTECTOMY WITH INTRAOPERATIVE CHOLANGIOGRAM;  Surgeon: Leonie Green, MD;  Location: [facility] ORS;  Service: General;  Laterality: N/A;   Family History  Problem Relation Age of Onset  . Diabetes Mother   . Arthritis Father   . Diabetes Father   . Heart disease Father   . Hypertension Father   . Stroke Father   . Diabetes Maternal Grandmother   . Hypertension Maternal Grandmother   . Heart disease Paternal Grandmother   . Hypertension Paternal Grandmother   . Lupus Sister   . Deafness Sister    Social History   Tobacco Use  . Smoking status: Never Smoker  . Smokeless tobacco: Never Used  Substance Use Topics  . Alcohol use: Yes    Alcohol/week: 0.0 oz    Comment: OCC  . Drug use: No   Review of Systems  Constitutional: Negative for unexpected weight change.  Respiratory: Positive for shortness of breath (30 minutes).   Cardiovascular: Negative for chest pain and leg swelling.  Genitourinary: Positive for vaginal discharge (brownish; wants testing today).    Objective:   Vitals:   11/29/17 1039  BP: 124/82  Pulse: 81  Resp: 12  Temp: 98.3 F (36.8 C)  TempSrc: Oral  SpO2: 98%  Weight: 229 lb 3.2 oz (104 kg)  Height: 5' 7.38" (1.711 m)   Body mass index is 35.5 kg/m. Wt Readings from Last 3 Encounters:  11/29/17 229 lb 3.2 oz (104 kg)  08/23/17 231 lb 6.4 oz (105 kg)  08/13/17 230 lb (104.3 kg)   Physical Exam  Constitutional: She appears well-developed and well-nourished.  HENT:  Head: Normocephalic and atraumatic.  Right Ear: Hearing, tympanic membrane, external ear and ear canal normal.  Left Ear: Hearing, tympanic membrane, external ear and ear canal normal.  Eyes: Conjunctivae and EOM are normal. Right eye exhibits no hordeolum. Left eye exhibits no hordeolum. No scleral icterus.  Neck: Carotid bruit is not present. No thyromegaly present.  Cardiovascular: Normal rate, regular rhythm, S1 normal, S2 normal and normal heart sounds.   No extrasystoles are present.  Pulmonary/Chest: Effort normal and breath sounds normal. No respiratory distress. Right breast exhibits no inverted nipple, no mass, no nipple discharge, no skin change and no tenderness. Left breast exhibits no inverted nipple, no mass, no nipple discharge, no skin change and no tenderness. Breasts are symmetrical.  Abdominal: Soft. Normal appearance and bowel sounds are normal. She exhibits no distension, no abdominal bruit, no pulsatile midline mass and no mass. There is no hepatosplenomegaly. There is no tenderness. No hernia.  Genitourinary: There is no lesion on the right labia. There is no lesion on the left labia. Vaginal  discharge (brownish; no odor) found.  Musculoskeletal: Normal range of motion. She exhibits no edema.  Lymphadenopathy:       Head (right side): No submandibular adenopathy present.       Head (left side): No submandibular adenopathy present.    She has no cervical adenopathy.    She has no axillary adenopathy.  Neurological: She is alert. She displays no tremor. No cranial nerve deficit. She exhibits normal muscle tone. Gait normal.  Reflex Scores:      Patellar reflexes are 2+ on the right side and 2+ on the left side. Skin: Skin is warm and dry. No bruising and no ecchymosis noted. No cyanosis. No pallor.     Dark brown keratotic lesion left arm, papular lesion right upper chest  Psychiatric: Her speech is normal and behavior is normal. Thought content normal. Her mood appears not anxious. She does not exhibit a depressed mood.    Assessment/Plan:   Problem List Items Addressed This Visit      Cardiovascular and Mediastinum   Supraventricular tachycardia ([place])    asymptomatic        Endocrine   Diabetes mellitus type 2 in obese ([place])    Foot exam by MD today; check A1c; last urine microalb:Cr normal      Relevant Orders   Hemoglobin A1c     Other   Preventative health care - Primary    USPSTF grade A and B  recommendations reviewed with patient; age-appropriate recommendations, preventive care, screening tests, etc discussed and encouraged; healthy living encouraged; see AVS for patient education given to patient       Relevant Orders   CBC with Differential/Platelet   COMPLETE METABOLIC PANEL WITH GFR   Lipid panel   TSH   Morbid obesity ([place])    BMI >35 with comorbidities of DM, HTN, gallbladder disease Encouraged her to take time for her; weight loss, activity; see AVS      Controlled type 2 diabetes mellitus with hyperglycemia, without long-term current use of insulin ([place])    Other Visit Diagnoses    Screen for STD (sexually transmitted disease)       Relevant Orders   C. trachomatis/N. gonorrhoeae RNA   RPR   Hepatitis C Antibody   HIV antibody (with reflex)   WET PREP BY MOLECULAR PROBE   Hepatitis B surface antibody   Hepatitis B surface antigen   Vaginal discharge       Relevant Orders   WET PREP BY MOLECULAR PROBE       No orders of the defined types were placed in this encounter.  Orders Placed This Encounter  Procedures  . C. trachomatis/N. gonorrhoeae RNA  . WET PREP BY MOLECULAR PROBE  . RPR  . Hepatitis C Antibody  . HIV antibody (with reflex)  . CBC with Differential/Platelet  . COMPLETE METABOLIC PANEL WITH GFR  . Lipid panel  . TSH  . Hepatitis B surface antibody  . Hepatitis B surface antigen  . Hemoglobin A1c    Follow up plan: Return in about 1 year (around 11/30/2018) for complete physical; 3 months for diabetes.  An After Visit Summary was printed and given to the patient.

## 2017-11-29 NOTE — Assessment & Plan Note (Addendum)
BMI >35 with comorbidities of DM, HTN, gallbladder disease Encouraged her to take time for her; weight loss, activity; see AVS

## 2017-11-29 NOTE — Patient Instructions (Addendum)
Please do see your eye doctor regularly, and have your eyes examined every year (or more often per his or her recommendation) Check your feet every night and let me know right away of any sores, infections, numbness, etc. Try to limit sweets, white bread, white rice, white potatoes It is okay with me for you to not check your fingerstick blood sugars (per [employer]in[employer] Best Practices), unless you are interested and feel it would be helpful for you  Check out the information at familydoctor.org entitled "Nutrition for Weight Loss: What You Need to Know about Fad Diets" Try to lose between 1-2 pounds per week by taking in fewer calories and burning off more calories You can succeed by limiting portions, limiting foods dense in calories and fat, becoming more active, and drinking 8 glasses of water a day (64 ounces) Don't skip meals, especially breakfast, as skipping meals may alter your metabolism Do not use over-the-counter weight loss pills or gimmicks that claim rapid weight loss A healthy BMI (or body mass index) is between 18.5 and 24.9 You can calculate your ideal BMI at the [place] website [employer]fr  Health Maintenance, Female Adopting a healthy lifestyle and getting preventive care can go a long way to promote health and wellness. Talk with your health care provider about what schedule of regular examinations is right for you. This is a good chance for you to check in with your provider about disease prevention and staying healthy. In between checkups, there are plenty of things you can do on your own. Experts have done a lot of research about which lifestyle changes and preventive measures are most likely to keep you healthy. Ask your health care provider for more information. Weight and diet Eat a healthy diet  Be sure to include plenty of vegetables, fruits, low-fat dairy products, and lean protein.  Do not eat a  lot of foods high in solid fats, added sugars, or salt.  Get regular exercise. This is one of the most important things you can do for your health. ? Most adults should exercise for at least 150 minutes each week. The exercise should increase your heart rate and make you sweat (moderate-intensity exercise). ? Most adults should also do strengthening exercises at least twice a week. This is in addition to the moderate-intensity exercise.  Maintain a healthy weight  Body mass index (BMI) is a measurement that can be used to identify possible weight problems. It estimates body fat based on height and weight. Your health care provider can help determine your BMI and help you achieve or maintain a healthy weight.  For females 36 years of age and older: ? A BMI below 18.5 is considered underweight. ? A BMI of 18.5 to 24.9 is normal. ? A BMI of 25 to 29.9 is considered overweight. ? A BMI of 30 and above is considered obese.  Watch levels of cholesterol and blood lipids  You should start having your blood tested for lipids and cholesterol at 24 years of age, then have this test every 5 years.  You may need to have your cholesterol levels checked more often if: ? Your lipid or cholesterol levels are high. ? You are older than 24 years of age. ? You are at high risk for heart disease.  Cancer screening Lung Cancer  Lung cancer screening is recommended for adults 14-72 years old who are at high risk for lung cancer because of a history of smoking.  A yearly low-dose CT scan  of the lungs is recommended for people who: ? Currently smoke. ? Have quit within the past 15 years. ? Have at least a 30-pack-year history of smoking. A pack year is smoking an average of one pack of cigarettes a day for 1 year.  Yearly screening should continue until it has been 15 years since you quit.  Yearly screening should stop if you develop a health problem that would prevent you from having lung cancer  treatment.  Breast Cancer  Practice breast self-awareness. This means understanding how your breasts normally appear and feel.  It also means doing regular breast self-exams. Let your health care provider know about any changes, no matter how small.  If you are in your 20s or 30s, you should have a clinical breast exam (CBE) by a health care provider every 1-3 years as part of a regular health exam.  If you are 67 or older, have a CBE every year. Also consider having a breast X-ray (mammogram) every year.  If you have a family history of breast cancer, talk to your health care provider about genetic screening.  If you are at high risk for breast cancer, talk to your health care provider about having an MRI and a mammogram every year.  Breast cancer gene (BRCA) assessment is recommended for women who have family members with BRCA-related cancers. BRCA-related cancers include: ? Breast. ? Ovarian. ? Tubal. ? Peritoneal cancers.  Results of the assessment will determine the need for genetic counseling and BRCA1 and BRCA2 testing.  Cervical Cancer Your health care provider may recommend that you be screened regularly for cancer of the pelvic organs (ovaries, uterus, and vagina). This screening involves a pelvic examination, including checking for microscopic changes to the surface of your cervix (Pap test). You may be encouraged to have this screening done every 3 years, beginning at age 75.  For women ages 57-65, health care providers may recommend pelvic exams and Pap testing every 3 years, or they may recommend the Pap and pelvic exam, combined with testing for human papilloma virus (HPV), every 5 years. Some types of HPV increase your risk of cervical cancer. Testing for HPV may also be done on women of any age with unclear Pap test results.  Other health care providers may not recommend any screening for nonpregnant women who are considered low risk for pelvic cancer and who do not have  symptoms. Ask your health care provider if a screening pelvic exam is right for you.  If you have had past treatment for cervical cancer or a condition that could lead to cancer, you need Pap tests and screening for cancer for at least 20 years after your treatment. If Pap tests have been discontinued, your risk factors (such as having a new sexual partner) need to be reassessed to determine if screening should resume. Some women have medical problems that increase the chance of getting cervical cancer. In these cases, your health care provider may recommend more frequent screening and Pap tests.  Colorectal Cancer  This type of cancer can be detected and often prevented.  Routine colorectal cancer screening usually begins at 24 years of age and continues through 24 years of age.  Your health care provider may recommend screening at an earlier age if you have risk factors for colon cancer.  Your health care provider may also recommend using home test kits to check for hidden blood in the stool.  A small camera at the end of a tube can be  used to examine your colon directly (sigmoidoscopy or colonoscopy). This is done to check for the earliest forms of colorectal cancer.  Routine screening usually begins at age 40.  Direct examination of the colon should be repeated every 5-10 years through 24 years of age. However, you may need to be screened more often if early forms of precancerous polyps or small growths are found.  Skin Cancer  Check your skin from head to toe regularly.  Tell your health care provider about any new moles or changes in moles, especially if there is a change in a mole's shape or color.  Also tell your health care provider if you have a mole that is larger than the size of a pencil eraser.  Always use sunscreen. Apply sunscreen liberally and repeatedly throughout the day.  Protect yourself by wearing long sleeves, pants, a wide-brimmed hat, and sunglasses whenever you  are outside.  Heart disease, diabetes, and high blood pressure  High blood pressure causes heart disease and increases the risk of stroke. High blood pressure is more likely to develop in: ? People who have blood pressure in the high end of the normal range (130-139/85-89 mm Hg). ? People who are overweight or obese. ? People who are African American.  If you are 37-16 years of age, have your blood pressure checked every 3-5 years. If you are 27 years of age or older, have your blood pressure checked every year. You should have your blood pressure measured twice-once when you are at a hospital or clinic, and once when you are not at a hospital or clinic. Record the average of the two measurements. To check your blood pressure when you are not at a hospital or clinic, you can use: ? An automated blood pressure machine at a pharmacy. ? A home blood pressure monitor.  If you are between 52 years and 39 years old, ask your health care provider if you should take aspirin to prevent strokes.  Have regular diabetes screenings. This involves taking a blood sample to check your fasting blood sugar level. ? If you are at a normal weight and have a low risk for diabetes, have this test once every three years after 24 years of age. ? If you are overweight and have a high risk for diabetes, consider being tested at a younger age or more often. Preventing infection Hepatitis B  If you have a higher risk for hepatitis B, you should be screened for this virus. You are considered at high risk for hepatitis B if: ? You were born in a country where hepatitis B is common. Ask your health care provider which countries are considered high risk. ? Your parents were born in a high-risk country, and you have not been immunized against hepatitis B (hepatitis B vaccine). ? You have HIV or AIDS. ? You use needles to inject street drugs. ? You live with someone who has hepatitis B. ? You have had sex with someone who  has hepatitis B. ? You get hemodialysis treatment. ? You take certain medicines for conditions, including cancer, organ transplantation, and autoimmune conditions.  Hepatitis C  Blood testing is recommended for: ? Everyone born from [birth year] through 1967. ? Anyone with known risk factors for hepatitis C.  Sexually transmitted infections (STIs)  You should be screened for sexually transmitted infections (STIs) including gonorrhea and chlamydia if: ? You are sexually active and are younger than 24 years of age. ? You are older than 24 years of  age and your health care provider tells you that you are at risk for this type of infection. ? Your sexual activity has changed since you were last screened and you are at an increased risk for chlamydia or gonorrhea. Ask your health care provider if you are at risk.  If you do not have HIV, but are at risk, it may be recommended that you take a prescription medicine daily to prevent HIV infection. This is called pre-exposure prophylaxis (PrEP). You are considered at risk if: ? You are sexually active and do not regularly use condoms or know the HIV status of your partner(s). ? You take drugs by injection. ? You are sexually active with a partner who has HIV.  Talk with your health care provider about whether you are at high risk of being infected with HIV. If you choose to begin PrEP, you should first be tested for HIV. You should then be tested every 3 months for as long as you are taking PrEP. Pregnancy  If you are premenopausal and you may become pregnant, ask your health care provider about preconception counseling.  If you may become pregnant, take 400 to 800 micrograms (mcg) of folic acid every day.  If you want to prevent pregnancy, talk to your health care provider about birth control (contraception). Osteoporosis and menopause  Osteoporosis is a disease in which the bones lose minerals and strength with aging. This can result in serious bone  fractures. Your risk for osteoporosis can be identified using a bone density scan.  If you are 31 years of age or older, or if you are at risk for osteoporosis and fractures, ask your health care provider if you should be screened.  Ask your health care provider whether you should take a calcium or vitamin D supplement to lower your risk for osteoporosis.  Menopause may have certain physical symptoms and risks.  Hormone replacement therapy may reduce some of these symptoms and risks. Talk to your health care provider about whether hormone replacement therapy is right for you. Follow these instructions at home:  Schedule regular health, dental, and eye exams.  Stay current with your immunizations.  Do not use any tobacco products including cigarettes, chewing tobacco, or electronic cigarettes.  If you are pregnant, do not drink alcohol.  If you are breastfeeding, limit how much and how often you drink alcohol.  Limit alcohol intake to no more than 1 drink per day for nonpregnant women. One drink equals 12 ounces of beer, 5 ounces of wine, or 1 ounces of hard liquor.  Do not use street drugs.  Do not share needles.  Ask your health care provider for help if you need support or information about quitting drugs.  Tell your health care provider if you often feel depressed.  Tell your health care provider if you have ever been abused or do not feel safe at home. This information is not intended to replace advice given to you by your health care provider. Make sure you discuss any questions you have with your health care provider. Document Released: 12/13/2010 Document Revised: 11/05/2015 Document Reviewed: 03/03/2015 Elsevier Interactive Patient Education  2018 [employer].  Obesity, Adult Obesity is the condition of having too much total body fat. Being overweight or obese means that your weight is greater than what is considered healthy for your body size. Obesity is determined  by a measurement called BMI. BMI is an estimate of body fat and is calculated from height and weight. For  adults, a BMI of 30 or higher is considered obese. Obesity can eventually lead to other health concerns and major illnesses, including:  Stroke.  Coronary artery disease (CAD).  Type 2 diabetes.  Some types of cancer, including cancers of the colon, breast, uterus, and gallbladder.  Osteoarthritis.  High blood pressure (hypertension).  High cholesterol.  Sleep apnea.  Gallbladder stones.  Infertility problems.  What are the causes? The main cause of obesity is taking in (consuming) more calories than your body uses for energy. Other factors that contribute to this condition may include:  Being born with genes that make you more likely to become obese.  Having a medical condition that causes obesity. These conditions include: ? Hypothyroidism. ? Polycystic ovarian syndrome (PCOS). ? Binge-eating disorder. ? Cushing syndrome.  Taking certain medicines, such as steroids, antidepressants, and seizure medicines.  Not being physically active (sedentary lifestyle).  Living where there are limited places to exercise safely or buy healthy foods.  Not getting enough sleep.  What increases the risk? The following factors may increase your risk of this condition:  Having a family history of obesity.  Being a woman of African-American descent.  Being a man of Hispanic descent.  What are the signs or symptoms? Having excessive body fat is the main symptom of this condition. How is this diagnosed? This condition may be diagnosed based on:  Your symptoms.  Your medical history.  A physical exam. Your health care provider may measure: ? Your BMI. If you are an adult with a BMI between 25 and less than 30, you are considered overweight. If you are an adult with a BMI of 30 or higher, you are considered obese. ? The distances around your hips and your waist  (circumferences). These may be compared to each other to help diagnose your condition. ? Your skinfold thickness. Your health care provider may gently pinch a fold of your skin and measure it.  How is this treated? Treatment for this condition often includes changing your lifestyle. Treatment may include some or all of the following:  Dietary changes. Work with your health care provider and a dietitian to set a weight-loss goal that is healthy and reasonable for you. Dietary changes may include eating: ? Smaller portions. A portion size is the amount of a particular food that is healthy for you to eat at one time. This varies from person to person. ? Low-calorie or low-fat options. ? More whole grains, fruits, and vegetables.  Regular physical activity. This may include aerobic activity (cardio) and strength training.  Medicine to help you lose weight. Your health care provider may prescribe medicine if you are unable to lose 1 pound a week after 6 weeks of eating more healthily and doing more physical activity.  Surgery. Surgical options may include gastric banding and gastric bypass. Surgery may be done if: ? Other treatments have not helped to improve your condition. ? You have a BMI of 40 or higher. ? You have life-threatening health problems related to obesity.  Follow these instructions at home:  Eating and drinking   Follow recommendations from your health care provider about what you eat and drink. Your health care provider may advise you to: ? Limit fast foods, sweets, and processed snack foods. ? Choose low-fat options, such as low-fat milk instead of whole milk. ? Eat 5 or more servings of fruits or vegetables every day. ? Eat at home more often. This gives you more control over what you eat. ?  Choose healthy foods when you eat out. ? Learn what a healthy portion size is. ? Keep low-fat snacks on hand. ? Avoid sugary drinks, such as soda, fruit juice, iced tea sweetened  with sugar, and flavored milk. ? Eat a healthy breakfast.  Drink enough water to keep your urine clear or pale yellow.  Do not go without eating for long periods of time (do not fast) or follow a fad diet. Fasting and fad diets can be unhealthy and even dangerous. Physical Activity  Exercise regularly, as told by your health care provider. Ask your health care provider what types of exercise are safe for you and how often you should exercise.  Warm up and stretch before being active.  Cool down and stretch after being active.  Rest between periods of activity. Lifestyle  Limit the time that you spend in front of your TV, computer, or video game system.  Find ways to reward yourself that do not involve food.  Limit alcohol intake to no more than 1 drink a day for nonpregnant women and 2 drinks a day for men. One drink equals 12 oz of beer, 5 oz of wine, or 1 oz of hard liquor. General instructions  Keep a weight loss journal to keep track of the food you eat and how much you exercise you get.  Take over-the-counter and prescription medicines only as told by your health care provider.  Take vitamins and supplements only as told by your health care provider.  Consider joining a support group. Your health care provider may be able to recommend a support group.  Keep all follow-up visits as told by your health care provider. This is important. Contact a health care provider if:  You are unable to meet your weight loss goal after 6 weeks of dietary and lifestyle changes. This information is not intended to replace advice given to you by your health care provider. Make sure you discuss any questions you have with your health care provider. Document Released: 07/07/2004 Document Revised: 11/02/2015 Document Reviewed: 03/18/2015 Elsevier Interactive Patient Education  2018 [employer].

## 2017-11-29 NOTE — Assessment & Plan Note (Signed)
USPSTF grade A and B recommendations reviewed with patient; age-appropriate recommendations, preventive care, screening tests, etc discussed and encouraged; healthy living encouraged; see AVS for patient education given to patient  

## 2017-11-29 NOTE — Assessment & Plan Note (Signed)
Foot exam by MD today; check A1c; last urine microalb:Cr normal

## 2017-12-01 LAB — COMPLETE METABOLIC PANEL WITH GFR
AG RATIO: 1.5 (calc) (ref 1.0–2.5)
ALT: 13 U/L (ref 6–29)
AST: 13 U/L (ref 10–30)
Albumin: 4.1 g/dL (ref 3.6–5.1)
Alkaline phosphatase (APISO): 41 U/L (ref 33–115)
BILIRUBIN TOTAL: 0.4 mg/dL (ref 0.2–1.2)
BUN: 9 mg/dL (ref 7–25)
CHLORIDE: 105 mmol/L (ref 98–110)
CO2: 27 mmol/L (ref 20–32)
Calcium: 9.1 mg/dL (ref 8.6–10.2)
Creat: 0.65 mg/dL (ref 0.50–1.10)
GFR, EST AFRICAN AMERICAN: 144 mL/min/{1.73_m2} (ref >=60)
GFR, Est Non African American: 124 mL/min/{1.73_m2} (ref >=60)
Globulin: 2.7 g/dL (calc) (ref 1.9–3.7)
Glucose, Bld: 93 mg/dL (ref 65–99)
POTASSIUM: 4.1 mmol/L (ref 3.5–5.3)
Sodium: 140 mmol/L (ref 135–146)
TOTAL PROTEIN: 6.8 g/dL (ref 6.1–8.1)

## 2017-12-01 LAB — HEPATITIS B SURFACE ANTIBODY,QUALITATIVE: HEP B S AB: NONREACTIVE

## 2017-12-01 LAB — LIPID PANEL
Cholesterol: 131 mg/dL (ref <200)
HDL: 63 mg/dL (ref >50)
LDL Cholesterol (Calc): 49 mg/dL (calc)
Non-HDL Cholesterol (Calc): 68 mg/dL (calc) (ref <130)
TRIGLYCERIDES: 103 mg/dL (ref <150)
Total CHOL/HDL Ratio: 2.1 (calc) (ref <5.0)

## 2017-12-01 LAB — CBC WITH DIFFERENTIAL/PLATELET
BASOS PCT: 0.4 %
Basophils Absolute: 20 cells/uL (ref 0–200)
EOS ABS: 190 {cells}/uL (ref 15–500)
Eosinophils Relative: 3.8 %
HCT: 38 % (ref 35.0–45.0)
Hemoglobin: 13 g/dL (ref 11.7–15.5)
Lymphs Abs: 1835 cells/uL (ref 850–3900)
MCH: 28.1 pg (ref 27.0–33.0)
MCHC: 34.2 g/dL (ref 32.0–36.0)
MCV: 82.1 fL (ref 80.0–100.0)
MPV: 11.8 fL (ref 7.5–12.5)
Monocytes Relative: 4.8 %
NEUTROS PCT: 54.3 %
Neutro Abs: 2715 cells/uL (ref 1500–7800)
PLATELETS: 251 10*3/uL (ref 140–400)
RBC: 4.63 10*6/uL (ref 3.80–5.10)
RDW: 13.8 % (ref 11.0–15.0)
TOTAL LYMPHOCYTE: 36.7 %
WBC: 5 10*3/uL (ref 3.8–10.8)
WBCMIX: 240 {cells}/uL (ref 200–950)

## 2017-12-01 LAB — WET PREP BY MOLECULAR PROBE
Candida species: NOT DETECTED
Gardnerella vaginalis: NOT DETECTED
MICRO NUMBER: 90737094
SPECIMEN QUALITY: ADEQUATE
Trichomonas vaginosis: NOT DETECTED

## 2017-12-01 LAB — HEMOGLOBIN A1C
Hgb A1c MFr Bld: 6.8 % of total Hgb — ABNORMAL HIGH (ref <5.7)
Mean Plasma Glucose: 148 (calc)
eAG (mmol/L): 8.2 (calc)

## 2017-12-01 LAB — HIV ANTIBODY (ROUTINE TESTING W REFLEX): HIV: NONREACTIVE

## 2017-12-01 LAB — RPR: RPR Ser Ql: NONREACTIVE

## 2017-12-01 LAB — C. TRACHOMATIS/N. GONORRHOEAE RNA
C. trachomatis RNA, TMA: NOT DETECTED
N. gonorrhoeae RNA, TMA: NOT DETECTED

## 2017-12-01 LAB — HEPATITIS C ANTIBODY
Hepatitis C Ab: NONREACTIVE
SIGNAL TO CUT-OFF: 0.04 (ref <1.00)

## 2017-12-01 LAB — HEPATITIS B SURFACE ANTIGEN: HEP B S AG: NONREACTIVE

## 2017-12-01 LAB — TSH: TSH: 0.61 mIU/L

## 2017-12-05 ENCOUNTER — Encounter: Payer: Self-pay | Admitting: Family Medicine

## 2017-12-26 ENCOUNTER — Encounter: Payer: Self-pay | Admitting: Nurse Practitioner

## 2017-12-26 ENCOUNTER — Telehealth: Payer: Self-pay | Admitting: Family Medicine

## 2017-12-26 ENCOUNTER — Other Ambulatory Visit (HOSPITAL_COMMUNITY)
Admission: RE | Admit: 2017-12-26 | Discharge: 2017-12-26 | Disposition: A | Discharge status: Home / Self Care | Payer: 59 | Source: Ambulatory Visit | Attending: Nurse Practitioner | Admitting: Nurse Practitioner

## 2017-12-26 ENCOUNTER — Ambulatory Visit (INDEPENDENT_AMBULATORY_CARE_PROVIDER_SITE_OTHER): Payer: 59 | Admitting: Nurse Practitioner

## 2017-12-26 VITALS — BP 132/80 | HR 82 | Temp 98.6°F | Resp 16 | Ht 67.38 in | Wt 232.6 lb

## 2017-12-26 DIAGNOSIS — N939 Abnormal uterine and vaginal bleeding, unspecified: Secondary | ICD-10-CM

## 2017-12-26 DIAGNOSIS — N898 Other specified noninflammatory disorders of vagina: Secondary | ICD-10-CM

## 2017-12-26 DIAGNOSIS — R8290 Unspecified abnormal findings in urine: Secondary | ICD-10-CM | POA: Diagnosis not present

## 2017-12-26 DIAGNOSIS — Z113 Encounter for screening for infections with a predominantly sexual mode of transmission: Secondary | ICD-10-CM | POA: Diagnosis not present

## 2017-12-26 DIAGNOSIS — B9689 Other specified bacterial agents as the cause of diseases classified elsewhere: Secondary | ICD-10-CM | POA: Diagnosis not present

## 2017-12-26 DIAGNOSIS — E119 Type 2 diabetes mellitus without complications: Secondary | ICD-10-CM

## 2017-12-26 MED ORDER — METFORMIN HCL ER 500 MG PO TB24: 500.0000 mg | ORAL_TABLET | Freq: Every day | ORAL | 5 refills | Status: DC

## 2017-12-26 NOTE — Patient Instructions (Addendum)
- Please schedule an eye exam.  - Take birth control as prescribed - You should hear from us within the next 2-3 days about your wet prep results and we will send you medications if needed - We are referring you to GYN to further investigate your break through bleeding while on oral contraceptives.     Pregnancy and Sexually Transmitted Infections An STI (sexually transmitted infection) is a disease or infection that may be passed (transmitted) from person to person, usually during sexual activity. This may happen by way of saliva, semen, blood, vaginal mucus, or urine. An STI can be caused by bacteria, viruses, or parasites. During pregnancy, STIs can be dangerous for you and your unborn baby. It is important to take steps to reduce your chances of getting an STI. Also, you need to be seen by your health care provider right away if you think you may have an STI, or if you think you may have been exposed to an STI. Diagnosis and treatment will depend on the type of STI. If you are already pregnant, you will be screened for HIV (human immunodeficiency virus) early in your pregnancy. If you are at high risk for HIV, this test may be repeated during your third trimester of pregnancy. What are some common STIs? There are different types of STIs. Some STIs that cause problems in pregnancy include:  Gonorrhea.  Chlamydia.  Syphilis.  HIV and AIDS (acquired immunodeficiency syndrome).  Genital herpes.  Hepatitis.  Genital warts.  Human papillomavirus (HPV).  Trichomoniasis.  STIs that do not affect the baby include:  Chancroid.  Pubic lice.  What are the possible effects of STIs during pregnancy? STIs can cause:  Stillbirth.  Miscarriage.  Premature labor.  Premature rupture of the membranes.  Serious birth defects or deformities.  Infection of the amniotic sac.  Infections that occur after birth (postpartum) in you and the baby.  Slowed growth of the baby before  birth.  Illnesses in newborns.  What are common symptoms of STIs? Different STIs have different symptoms. Some women may not have any symptoms. If symptoms are present, they may include:  Painful or bloody urination.  Pain in the pelvis, abdomen, vagina, anus, throat, or eyes.  A skin rash, itching, or irritation.  Growths, ulcerations, blisters, or sores in the genital and anal areas.  Fever.  Abnormal vaginal discharge, with or without bad odor.  Pain or bleeding during sexual intercourse.  Yellowing of the skin and the white parts of the eyes (jaundice).  Swollen glands in the groin area.  Even if symptoms are not present, an STI can still be passed to another person during sexual contact. How are STIs diagnosed? Your health care provider can use tests to determine if you have an STI. These may include blood tests, urine tests, and tests performed during a pelvic exam. You should be screened for STIs, including gonorrhea and chlamydia, if:  You are sexually active and are younger than age 24.  You are age 24 or older and your health care provider tells you that you are at risk for these types of infections.  Your sexual activity has changed since the last time you were screened, and you are at an increased risk for chlamydia or gonorrhea. Ask your health care provider if you are at risk.  How can I reduce my risk of getting an STI? Take these actions to reduce your risk of getting an STI:  Do not have any oral, vaginal, or anal  sex. This is known as practicing abstinence.  If you have sex, use a latex condom or a female condom consistently and correctly during sexual intercourse.  Use dental dams and water-soluble lubricants during sexual activity. Do not use petroleum jelly or oils.  Avoid having multiple sexual partners.  Do not have sex with someone who has other sexual partners.  Do not have sex with anyone you do not know or who is at high risk for an  STI.  Avoid risky sex acts that can break the skin.  Do not have sex if you have open sores on your mouth or skin.  Avoid engaging in oral and anal sex acts.  Get the hepatitis vaccine. It is safe for pregnant women.  What should I do if I think I have an STI?  See your health care provider.  Tell your sexual partner or partners. They should be tested and treated for any STIs.  Do not have sex until your health care provider says it is okay. Get help right away if: Contact your health care provider right away if:  You have any symptoms of an STI.  You think that you have, or your sexual partner has, an STI even if there are no symptoms.  You think that you may have been exposed to an STI.  This information is not intended to replace advice given to you by your health care provider. Make sure you discuss any questions you have with your health care provider. Document Released: 07/07/2004 Document Revised: 01/28/2016 Document Reviewed: 01/04/2016 Elsevier Interactive Patient Education  [employer].

## 2017-12-26 NOTE — Telephone Encounter (Signed)
Left detailed voicemail

## 2017-12-26 NOTE — Progress Notes (Signed)
Name: Penny Gonzalez   MRN: [id number]    DOB: 05/08/1996   Date:12/26/2017       Progress Note  Subjective  Chief Complaint  Chief Complaint  Patient presents with  . Vaginitis    continues to have recurrent BV. Treated with Flagyl and it comes back. No longer involved with partner who she thinks that was passing it to her.   . Medication Refill    Needs refill on Metformin. Pills are almost powder form.    HPI   Per vaginal spotting that has been ongoing for 1 to 2 weeks and stopped 3 days ago as well as clear foul-smelling vaginal discharge ago.  Patient states she has had irregular periods and was placed on birth control in the past.  2 months ago she had been involved with someone and had been getting frequent BV infections.  After she had ended things with him she had a full negative STD panel in May.  Patient states after taking antibiotics with her birth control she noticed she was having vaginal spotting.  States this stopped 3 days ago. She has been skipping the placebo weeks of her birth control, and taking her birth control every day with no missed doses.  Patient states she recently did have a new sexual partner, with unprotected sex.  Patient denies abdominal pain, nausea, vaginal pain, dyspaurenia, dysuria. Notes has foul smelling urine, and clear vaginal secretions that had foul odor but not strong fishy odor with BV.   Patient Active Problem List   Diagnosis Date Noted  . Controlled type 2 diabetes mellitus with hyperglycemia, without long-term current use of insulin (HCC) 11/29/2017  . Supraventricular tachycardia (HCC) 11/29/2017  . Morbid obesity (HCC) 11/29/2017  . DUB (dysfunctional uterine bleeding) 08/23/2017  . Acanthosis nigricans 06/27/2017  . Latex allergy, contact dermatitis 01/13/2017  . Dermatitis 12/30/2016  . Bacterial vaginosis 10/14/2016  . Allergic rhinitis 09/23/2016  . Fever blister 08/21/2016  . Fatty liver 05/07/2016  . Foot pain, bilateral  03/31/2016  . Essential hypertension, benign 03/31/2016  . Proteinuria 01/30/2016  . Major depression 11/23/2015  . Vitamin D deficiency 11/19/2015  . Preventative health care 10/27/2015  . Medication monitoring encounter 10/27/2015  . Constipation, slow transit 02/18/2015  . Tonsil stone 02/18/2015  . Diabetes mellitus type 2 in obese (HCC) 11/26/2014  . Headache disorder 11/26/2014  . Arthralgia of multiple joints 11/26/2014    Past Medical History:  Diagnosis Date  . Anemia, iron deficiency, inadequate dietary intake 12/01/2014  . Depression    patient has not been officially diagnosed but has symptoms  . Diabetes mellitus without complication (HCC)   . Essential hypertension, benign 03/31/2016  . Irregular periods/menstrual cycles   . Joint pain of leg   . Major depression 11/23/2015    Past Surgical History:  Procedure Laterality Date  . CHOLECYSTECTOMY N/A 05/19/2016   Procedure: LAPAROSCOPIC CHOLECYSTECTOMY WITH INTRAOPERATIVE CHOLANGIOGRAM;  Surgeon: Nadeen LandauJarvis Wilton Smith, MD;  Location: [facility] ORS;  Service: General;  Laterality: N/A;    Social History   Tobacco Use  . Smoking status: Never Smoker  . Smokeless tobacco: Never Used  Substance Use Topics  . Alcohol use: Yes    Alcohol/week: 0.0 oz    Comment: OCC     Current Outpatient Medications:  .  metFORMIN (GLUCOPHAGE-XR) 500 MG 24 hr tablet, Take 1 tablet (500 mg total) by mouth daily., Disp: 30 tablet, Rfl: 5 .  norgestimate-ethinyl estradiol (ORTHO-CYCLEN,SPRINTEC,PREVIFEM) 0.25-35 MG-MCG tablet, Take 1 tablet  by mouth daily. , Disp: , Rfl:  .  nystatin (MYCOSTATIN/NYSTOP) powder, Apply topically 2 (two) times daily as needed., Disp: , Rfl:   Allergies  Allergen Reactions  . Latex Rash    Pruritic, urticaria    ROS  No other specific complaints in a complete review of systems (except as listed in HPI above).  Objective  Vitals:   12/26/17 1600  BP: 132/80  Pulse: 82  Resp: 16  Temp: 98.6 F  (37 C)  TempSrc: Oral  SpO2: 99%  Weight: 232 lb 9.6 oz (105.5 kg)  Height: 5' 7.38" (1.711 m)     Body mass index is 36.02 kg/m.  Nursing Note and Vital Signs reviewed.  Physical Exam  Constitutional: Patient appears well-developed and well-nourished. Obese  No distress.  Cardiovascular: Normal rate, regular rhythm, S1/S2 present.  Pulses intact.  Pulmonary/Chest: Effort normal and breath sounds clear. No respiratory distress or retractions. Abdominal: Soft and non-tender, bowel sounds present; Psychiatric: Patient has a normal mood and affect. behavior is normal. Judgment and thought content normal.  No results found for this or any previous visit (from the past 72 hour(s)).  Assessment & Plan  1. Vaginal discharge n come back and have it done tomorrow if she would like as urine was dumped after POCT ua.  -Discussed safe sex practices with patient.  Patient refused further testing for STDs, after explaining patient's risk.  Urine was dumped prior to urine pregnancy test or culture, patient states she is unable to provide another sample.  Informed patient she can come in on the following day to provide a sample. - WET PREP BY MOLECULAR PROBE - Cervicovaginal ancillary only  2. Foul smelling urine - POCT Urinalysis Dipstick - Urine Culture; Future  3. Vaginal spotting Break through bleeding on birth control- refer to GYN; discussed taking ocp as prescribed - Ambulatory referral to Gynecology  4. Screening for STD (sexually transmitted disease) -pt refused further testing when she went to lab  5. Controlled type 2 diabetes mellitus without complication, without long-term current use of insulin (HCC) -cont diet and weight loss - metFORMIN (GLUCOPHAGE-XR) 500 MG 24 hr tablet; Take 1 tablet (500 mg total) by mouth daily.  Dispense: 30 tablet; Refill: 5   - Follow up and care instructions discussed and provided in AVS. -Reviewed Health Maintenance: please schedule eye  doctor appointment.

## 2017-12-26 NOTE — Telephone Encounter (Signed)
Copied from CRM [phone]#[id number]. Topic: Quick Communication - Rx Refill/Question >> Dec 26, 2017  8:16 AM Tamela OddiHarris, Savina Olshefski J wrote: Medication:  metroNIDAZOLE (FLAGYL) 500 MG tablet [[id number]  Patient called to request refill for the above medication.  CB# [id number]  Preferred Pharmacy (with phone number or street name): [facility] [address] - [place], [place] - [phone] (Phone) [phone] (Fax)

## 2017-12-26 NOTE — Telephone Encounter (Signed)
Please have patients do e-visits through MyChart for yeast, BV infections if they don't come in or we don't have acute spots

## 2017-12-27 LAB — POCT URINALYSIS DIPSTICK
BILIRUBIN UA: NEGATIVE
Glucose, UA: NEGATIVE
KETONES UA: NEGATIVE
Nitrite, UA: NEGATIVE
Odor: NORMAL
PH UA: 7 (ref 5.0–8.0)
Protein, UA: POSITIVE — AB
RBC UA: NEGATIVE
Spec Grav, UA: 1.02 (ref 1.010–1.025)
Urobilinogen, UA: 0.2 E.U./dL

## 2017-12-27 LAB — URINE CULTURE
MICRO NUMBER: 90843263
SPECIMEN QUALITY:: ADEQUATE

## 2017-12-28 ENCOUNTER — Telehealth: Payer: Self-pay | Admitting: Family Medicine

## 2017-12-28 ENCOUNTER — Encounter: Payer: Self-pay | Admitting: Family Medicine

## 2017-12-28 LAB — CERVICOVAGINAL ANCILLARY ONLY
Bacterial vaginitis: POSITIVE — AB
Candida vaginitis: NEGATIVE
Chlamydia: NEGATIVE
Neisseria Gonorrhea: NEGATIVE
TRICH (WINDOWPATH): NEGATIVE

## 2017-12-28 NOTE — Telephone Encounter (Signed)
Copied from CRM [id number]. Topic: Quick Communication - See Telephone Encounter >> Dec 28, 2017  8:15 AM Jolayne Hainesaylor, Brittany L wrote: CRM for notification. See Telephone encounter for: 12/28/17.  Patient would like the nurse to call her back about her results from 7/16. Please call her at @ [phone]

## 2017-12-28 NOTE — Telephone Encounter (Signed)
I don't think these labs are back yet. I haven't received a message from NP informing me to contact patient.

## 2017-12-28 NOTE — Telephone Encounter (Signed)
Urine and culture have resulted; wet prep and g/c are still listed as active.

## 2017-12-29 ENCOUNTER — Other Ambulatory Visit: Payer: Self-pay | Admitting: Family Medicine

## 2017-12-29 DIAGNOSIS — B9689 Other specified bacterial agents as the cause of diseases classified elsewhere: Secondary | ICD-10-CM

## 2017-12-29 DIAGNOSIS — N760 Acute vaginitis: Principal | ICD-10-CM

## 2017-12-29 MED ORDER — METRONIDAZOLE 500 MG PO TABS: 500.0000 mg | ORAL_TABLET | Freq: Two times a day (BID) | ORAL | 0 refills | Status: AC

## 2018-01-02 ENCOUNTER — Ambulatory Visit: Payer: Self-pay | Admitting: Obstetrics and Gynecology

## 2018-02-01 DIAGNOSIS — Z124 Encounter for screening for malignant neoplasm of cervix: Secondary | ICD-10-CM | POA: Diagnosis not present

## 2018-02-01 DIAGNOSIS — Z01419 Encounter for gynecological examination (general) (routine) without abnormal findings: Secondary | ICD-10-CM | POA: Diagnosis not present

## 2018-03-01 ENCOUNTER — Ambulatory Visit: Payer: 59 | Admitting: Family Medicine

## 2018-03-22 ENCOUNTER — Telehealth: Payer: Self-pay

## 2018-03-22 ENCOUNTER — Ambulatory Visit: Payer: 59 | Admitting: Family Medicine

## 2018-03-22 NOTE — Telephone Encounter (Signed)
    Copied from CRM [id number]. Topic: Quick Communication - Appointment Cancellation >> Mar 22, 2018 11:46 AM Trula Slade wrote: Patient called to cancel appointment scheduled for 03/22/18. Patient has rescheduled their appointment.  Patient stated an emergency came up and she had to reschedule her appt.  She would also like to know if she needs to fast for her upcoming appt on 03/29/18 at 2:20pm.  Route to department's PEC pool.

## 2018-03-22 NOTE — Telephone Encounter (Signed)
Left detailed VM.  

## 2018-03-22 NOTE — Telephone Encounter (Signed)
She does not need to fast

## 2018-03-29 ENCOUNTER — Ambulatory Visit (INDEPENDENT_AMBULATORY_CARE_PROVIDER_SITE_OTHER): Payer: 59 | Admitting: Family Medicine

## 2018-03-29 ENCOUNTER — Encounter: Payer: Self-pay | Admitting: Family Medicine

## 2018-03-29 VITALS — BP 124/72 | HR 94 | Temp 98.4°F | Ht 67.0 in | Wt 241.7 lb

## 2018-03-29 DIAGNOSIS — I10 Essential (primary) hypertension: Secondary | ICD-10-CM

## 2018-03-29 DIAGNOSIS — K760 Fatty (change of) liver, not elsewhere classified: Secondary | ICD-10-CM

## 2018-03-29 DIAGNOSIS — M545 Low back pain, unspecified: Secondary | ICD-10-CM

## 2018-03-29 DIAGNOSIS — E1169 Type 2 diabetes mellitus with other specified complication: Secondary | ICD-10-CM | POA: Diagnosis not present

## 2018-03-29 DIAGNOSIS — R350 Frequency of micturition: Secondary | ICD-10-CM | POA: Diagnosis not present

## 2018-03-29 DIAGNOSIS — E669 Obesity, unspecified: Secondary | ICD-10-CM

## 2018-03-29 LAB — POCT URINALYSIS DIPSTICK
BILIRUBIN UA: NEGATIVE
Blood, UA: NEGATIVE
Glucose, UA: NEGATIVE
Ketones, UA: NEGATIVE
Leukocytes, UA: NEGATIVE
Nitrite, UA: NEGATIVE
ODOR: NORMAL
PH UA: 5.5 (ref 5.0–8.0)
PROTEIN UA: NEGATIVE
Spec Grav, UA: 1.02 (ref 1.010–1.025)
UROBILINOGEN UA: 0.2 U/dL

## 2018-03-29 MED ORDER — NITROFURANTOIN MONOHYD MACRO 100 MG PO CAPS: 100.0000 mg | ORAL_CAPSULE | Freq: Two times a day (BID) | ORAL | 0 refills | Status: DC

## 2018-03-29 NOTE — Progress Notes (Signed)
BP 124/72   Pulse 94   Temp 98.4 F (36.9 C) (Oral)   Ht 5\' 7"  (1.702 m)   Wt 241 lb 11.2 oz (109.6 kg)   SpO2 99%   BMI 37.86 kg/m    Subjective:    Patient ID: Penny Gonzalez, female    DOB: January 03, 1996, 22 y.o.   MRN: [id number]  HPI: Penny Gonzalez is a 21 y.o. female  Chief Complaint  Patient presents with  . Follow-up  . Urinary Tract Infection    frequency, back pain    HPI  Patient is here for f/u  Also thinks she has a UTI; frequency; few weeks duration; she thought the BV came back, but no episodes in months; strong odor; no blood; no fevers; drinking some water, could improve; no sodas except one the other day and got a pimple  Diabetes; she feels like it is "okay"; she had a sugary coffee from [facility]; she does not check her sugars; she can just tell; some stinging; eye exam in May; no diabetic eye problems; A1c controlled in June; urine microalb:Cr was normal in March  Weight gain, not sure why; no constipation, moving pretty frequently; with her obesity, she has fatty liver as well She walks at work; she has no clue how many steps but will check her phone app She is eating out more lately; gets off work at 9:30 pm; will go to [employer]; hates [employer] or [employer]; will do [employer] or [employer]; politely declined referral to nutritionist  HTN; controlled today  Depression screen [facility] 2/9 03/29/2018 11/29/2017 08/23/2017 05/12/2017 03/21/2017  Decreased Interest 0 0 0 2 0  Down, Depressed, Hopeless 0 0 0 2 0  PHQ - 2 Score 0 0 0 4 0  Altered sleeping 0 - - 1 -  Tired, decreased energy 0 - - 2 -  Change in appetite 0 - - 2 -  Feeling bad or failure about yourself  0 - - 0 -  Trouble concentrating 0 - - 1 -  Moving slowly or fidgety/restless 0 - - 0 -  Suicidal thoughts 0 - - 0 -  PHQ-9 Score 0 - - 10 -  Difficult doing work/chores Not difficult at all - - Somewhat difficult -  Some recent data might be hidden   Fall Risk  03/29/2018 12/26/2017  11/29/2017 08/23/2017 05/12/2017  Falls in the past year? No No Yes No No  Number falls in past yr: - - 1 - -  Injury with Fall? - - No - -    Relevant past medical, surgical, family and social history reviewed Past Medical History:  Diagnosis Date  . Anemia, iron deficiency, inadequate dietary intake 12/01/2014  . Depression    patient has not been officially diagnosed but has symptoms  . Diabetes mellitus without complication (HCC)   . Essential hypertension, benign 03/31/2016  . Irregular periods/menstrual cycles   . Joint pain of leg   . Major depression 11/23/2015   Past Surgical History:  Procedure Laterality Date  . CHOLECYSTECTOMY N/A 05/19/2016   Procedure: LAPAROSCOPIC CHOLECYSTECTOMY WITH INTRAOPERATIVE CHOLANGIOGRAM;  Surgeon: Nadeen Landau, MD;  Location: [facility] ORS;  Service: General;  Laterality: N/A;   Family History  Problem Relation Age of Onset  . Diabetes Mother   . Arthritis Father   . Diabetes Father   . Heart disease Father   . Hypertension Father   . Stroke Father   . Diabetes Maternal Grandmother   . Hypertension  Maternal Grandmother   . Heart disease Paternal Grandmother   . Hypertension Paternal Grandmother   . Lupus Sister   . Deafness Sister    Social History   Tobacco Use  . Smoking status: Never Smoker  . Smokeless tobacco: Never Used  Substance Use Topics  . Alcohol use: Yes    Alcohol/week: 0.0 standard drinks    Comment: OCC  . Drug use: No     Office Visit from 03/29/2018 in [facility]  AUDIT-C Score  1      Interim medical history since last visit reviewed. Allergies and medications reviewed  Review of Systems Per HPI unless specifically indicated above     Objective:    BP 124/72   Pulse 94   Temp 98.4 F (36.9 C) (Oral)   Ht 5\' 7"  (1.702 m)   Wt 241 lb 11.2 oz (109.6 kg)   SpO2 99%   BMI 37.86 kg/m   Wt Readings from Last 3 Encounters:  03/29/18 241 lb 11.2 oz (109.6 kg)  12/26/17 232  lb 9.6 oz (105.5 kg)  11/29/17 229 lb 3.2 oz (104 kg)    Physical Exam  Constitutional: She appears well-developed and well-nourished. No distress.  HENT:  Head: Normocephalic and atraumatic.  Eyes: EOM are normal. No scleral icterus.  Neck: No thyromegaly present.  Cardiovascular: Normal rate, regular rhythm and normal heart sounds.  No murmur heard. Pulmonary/Chest: Effort normal and breath sounds normal. No respiratory distress. She has no wheezes.  Abdominal: Soft. Bowel sounds are normal. She exhibits no distension.  Musculoskeletal: She exhibits no edema.  Neurological: She is alert.  Skin: Skin is warm and dry. She is not diaphoretic. No pallor.  Psychiatric: She has a normal mood and affect. Her behavior is normal. Judgment and thought content normal.   Diabetic Foot Form - Detailed   Diabetic Foot Exam - detailed Diabetic Foot exam was performed with the following findings:  Yes 03/29/2018  2:37 PM  Visual Foot Exam completed.:  Yes  Pulse Foot Exam completed.:  Yes  Right Dorsalis Pedis:  Present Left Dorsalis Pedis:  Present  Sensory Foot Exam Completed.:  Yes Semmes-Weinstein Monofilament Test R Site 1-Great Toe:  Pos L Site 1-Great Toe:  Pos        Results for orders placed or performed in visit on 03/29/18  POCT urinalysis dipstick  Result Value Ref Range   Color, UA yellow    Clarity, UA clear    Glucose, UA Negative Negative   Bilirubin, UA neg    Ketones, UA neg    Spec Grav, UA 1.020 1.010 - 1.025   Blood, UA neg    pH, UA 5.5 5.0 - 8.0   Protein, UA Negative Negative   Urobilinogen, UA 0.2 0.2 or 1.0 E.U./dL   Nitrite, UA neg    Leukocytes, UA Negative Negative   Appearance clear    Odor normal       Assessment & Plan:   Problem List Items Addressed This Visit      Cardiovascular and Mediastinum   Essential hypertension, benign (Chronic)    Controlled today        Digestive   Fatty liver (Chronic)    Weight loss recommended         Endocrine   Diabetes mellitus type 2 in obese (HCC)    Check A1c every 6 months while controlled; encouraged weight loss; foot exam by MD        Other  Morbid obesity (HCC)    BMI > 35 with diabetes, fatty liver disease       Other Visit Diagnoses    Low back pain, unspecified back pain laterality, unspecified chronicity, unspecified whether sciatica present    -  Primary   Relevant Orders   POCT urinalysis dipstick (Completed)   Urinary frequency       Relevant Orders   POCT urinalysis dipstick (Completed)       Follow up plan: Return in about 9 weeks (around 05/31/2018) for twenty minute follow-up with fasting labs.  An after-visit summary was printed and given to the patient at check-out.  Please see the patient instructions which may contain other information and recommendations beyond what is mentioned above in the assessment and plan.  Meds ordered this encounter  Medications  . nitrofurantoin, macrocrystal-monohydrate, (MACROBID) 100 MG capsule    Sig: Take 1 capsule (100 mg total) by mouth 2 (two) times daily.    Dispense:  6 capsule    Refill:  0    Orders Placed This Encounter  Procedures  . POCT urinalysis dipstick

## 2018-03-29 NOTE — Patient Instructions (Addendum)
Drink plenty of water, enough to keep your urine pale yellow Start the antibiotics if your symptoms worsen or you get a fever or worsening pain  If you do start antibiotics: Please do eat yogurt or kimchi or take a probiotic daily for the next month We want to replace the healthy germs in the gut If you notice foul, watery diarrhea in the next two months, schedule an appointment RIGHT AWAY or go to an urgent care or the emergency room if a holiday or over a weekend  Try cranberry pills to help prevent urinary infections or sugar-free cranberry juice  Check out the information at familydoctor.org entitled "Nutrition for Weight Loss: What You Need to Know about Fad Diets" Try to lose between 1-2 pounds per week by taking in fewer calories and burning off more calories You can succeed by limiting portions, limiting foods dense in calories and fat, becoming more active, and drinking 8 glasses of water a day (64 ounces) Don't skip meals, especially breakfast, as skipping meals may alter your metabolism Do not use over-the-counter weight loss pills or gimmicks that claim rapid weight loss A healthy BMI (or body mass index) is between 18.5 and 24.9 You can calculate your ideal BMI at the NIH website JobEconomics.hu   Preventing Unhealthy Weight Gain, Adult Staying at a healthy weight is important. When fat builds up in your body, you may become overweight or obese. These conditions put you at greater risk for developing certain health problems, such as heart disease, diabetes, sleeping problems, joint problems, and some cancers. Unhealthy weight gain is often the result of making unhealthy choices in what you eat. It is also a result of not getting enough exercise. You can make changes to your lifestyle to prevent obesity and stay as healthy as possible. What nutrition changes can be made? To maintain a healthy weight and prevent obesity:  Eat  only as much as your body needs. To do this: ? Pay attention to signs that you are hungry or full. Stop eating as soon as you feel full. ? If you feel hungry, try drinking water first. Drink enough water so your urine is clear or pale yellow. ? Eat smaller portions. ? Look at serving sizes on food labels. Most foods contain more than one serving per container. ? Eat the recommended amount of calories for your gender and activity level. While most active people should eat around 2,000 calories per day, if you are trying to lose weight or are not very active, you main need to eat less calories. Talk to your health care provider or dietitian about how many calories you should eat each day.  Choose healthy foods, such as: ? Fruits and vegetables. Try to fill at least half of your plate at each meal with fruits and vegetables. ? Whole grains, such as whole wheat bread, brown rice, and quinoa. ? Lean meats, such as chicken or fish. ? Other healthy proteins, such as beans, eggs, or tofu. ? Healthy fats, such as nuts, seeds, fatty fish, and olive oil. ? Low-fat or fat-free dairy.  Check food labels and avoid food and drinks that: ? Are high in calories. ? Have added sugar. ? Are high in sodium. ? Have saturated fats or trans fats.  Limit how much you eat of the following foods: ? Prepackaged meals. ? Fast food. ? Fried foods. ? Processed meat, such as bacon, sausage, and deli meats. ? Fatty cuts of red meat and poultry with skin.  Penny Gonzalez  foods in healthier ways, such as by baking, broiling, or grilling.  When grocery shopping, try to shop around the outside of the store. This helps you buy mostly fresh foods and avoid canned and prepackaged foods.  What lifestyle changes can be made?  Exercise at least 30 minutes 5 or more days each week. Exercising includes brisk walking, yard work, biking, running, swimming, and team sports like basketball and soccer. Ask your health care provider which  exercises are safe for you.  Do not use any products that contain nicotine or tobacco, such as cigarettes and e-cigarettes. If you need help quitting, ask your health care provider.  Limit alcohol intake to no more than 1 drink a day for nonpregnant women and 2 drinks a day for men. One drink equals 12 oz of beer, 5 oz of wine, or 1 oz of hard liquor.  Try to get 7-9 hours of sleep each night. What other changes can be made?  Keep a food and activity journal to keep track of: ? What you ate and how many calories you had. Remember to count sauces, dressings, and side dishes. ? Whether you were active, and what exercises you did. ? Your calorie, weight, and activity goals.  Check your weight regularly. Track any changes. If you notice you have gained weight, make changes to your diet or activity routine.  Avoid taking weight-loss medicines or supplements. Talk to your health care provider before starting any new medicine or supplement.  Talk to your health care provider before trying any new diet or exercise plan. Why are these changes important? Eating healthy, staying active, and having healthy habits not only help prevent obesity, they also:  Help you to manage stress and emotions.  Help you to connect with friends and family.  Improve your self-esteem.  Improve your sleep.  Prevent long-term health problems.  What can happen if changes are not made? Being obese or overweight can cause you to develop joint or bone problems, which can make it hard for you to stay active or do activities you enjoy. Being obese or overweight also puts stress on your heart and lungs and can lead to health problems like diabetes, heart disease, and some cancers. Where to find more information: Talk with your health care provider or a dietitian about healthy eating and healthy lifestyle choices. You may also find other information through these resources:  U.S. Department of Agriculture MyPlate:  https://ball-collins.biz/  American Heart Association: www.heart.org  Centers for Disease Control and Prevention: FootballExhibition.com.br  Summary  Staying at a healthy weight is important. It helps prevent certain diseases and health problems, such as heart disease, diabetes, joint problems, sleep disorders, and some cancers.  Being obese or overweight can cause you to develop joint or bone problems, which can make it hard for you to stay active or do activities you enjoy.  You can prevent unhealthy weight gain by eating a healthy diet, exercising regularly, not smoking, limiting alcohol, and getting enough sleep.  Talk with your health care provider or a dietitian for guidance about healthy eating and healthy lifestyle choices. This information is not intended to replace advice given to you by your health care provider. Make sure you discuss any questions you have with your health care provider. Document Released: 05/31/2016 Document Revised: 07/06/2016 Document Reviewed: 07/06/2016 Elsevier Interactive Patient Education  [employer].

## 2018-03-30 NOTE — Assessment & Plan Note (Signed)
Check A1c every 6 months while controlled; encouraged weight loss; foot exam by MD

## 2018-03-30 NOTE — Assessment & Plan Note (Signed)
BMI > 35 with diabetes, fatty liver disease

## 2018-03-30 NOTE — Assessment & Plan Note (Signed)
Controlled today 

## 2018-03-30 NOTE — Assessment & Plan Note (Signed)
Weight loss recommended 

## 2018-05-31 ENCOUNTER — Ambulatory Visit: Payer: 59 | Admitting: Family Medicine

## 2018-06-11 ENCOUNTER — Encounter: Payer: Self-pay | Admitting: Nurse Practitioner

## 2018-06-11 ENCOUNTER — Ambulatory Visit (INDEPENDENT_AMBULATORY_CARE_PROVIDER_SITE_OTHER): Payer: 59 | Admitting: Nurse Practitioner

## 2018-06-11 VITALS — BP 126/82 | HR 98 | Temp 98.9°F | Resp 18 | Ht 67.0 in | Wt 241.0 lb

## 2018-06-11 DIAGNOSIS — R059 Cough, unspecified: Secondary | ICD-10-CM

## 2018-06-11 DIAGNOSIS — N3001 Acute cystitis with hematuria: Secondary | ICD-10-CM

## 2018-06-11 DIAGNOSIS — M545 Low back pain, unspecified: Secondary | ICD-10-CM

## 2018-06-11 DIAGNOSIS — R350 Frequency of micturition: Secondary | ICD-10-CM | POA: Diagnosis not present

## 2018-06-11 DIAGNOSIS — R0981 Nasal congestion: Secondary | ICD-10-CM | POA: Diagnosis not present

## 2018-06-11 DIAGNOSIS — R05 Cough: Secondary | ICD-10-CM | POA: Diagnosis not present

## 2018-06-11 LAB — POCT URINALYSIS DIPSTICK
APPEARANCE: NORMAL
Bilirubin, UA: NEGATIVE
Glucose, UA: NEGATIVE
Ketones, UA: NEGATIVE
NITRITE UA: NEGATIVE
PROTEIN UA: NEGATIVE
SPEC GRAV UA: 1.025 (ref 1.010–1.025)
Urobilinogen, UA: 0.2 E.U./dL
pH, UA: 7 (ref 5.0–8.0)

## 2018-06-11 LAB — POCT INFLUENZA A/B
Influenza A, POC: NEGATIVE
Influenza B, POC: NEGATIVE

## 2018-06-11 MED ORDER — PROMETHAZINE-DM 6.25-15 MG/5ML PO SYRP: 2.5000 mL | ORAL_SOLUTION | Freq: Four times a day (QID) | ORAL | 0 refills | Status: DC | PRN

## 2018-06-11 MED ORDER — NITROFURANTOIN MONOHYD MACRO 100 MG PO CAPS: 100.0000 mg | ORAL_CAPSULE | Freq: Two times a day (BID) | ORAL | 0 refills | Status: DC

## 2018-06-11 NOTE — Progress Notes (Signed)
Name: Penny Gonzalez   MRN: [id number]    DOB: 05/12/1996   Date:06/11/2018       Progress Note  Subjective  Chief Complaint  Chief Complaint  Patient presents with  . URI    HPI  Patient endorses rhinorrhea, facial congestion, body aches, sneezing, cough- started Friday, Symptoms were worse yesterday. State feels fatigued- had similar illness 3 weeks ago that resolved. Noted urinary frequency and lower back pain no dysuria.   Has been taking nyquil, tylenol, emergency C.   Patient Active Problem List   Diagnosis Date Noted  . Controlled type 2 diabetes mellitus with hyperglycemia, without long-term current use of insulin (HCC) 11/29/2017  . Supraventricular tachycardia (HCC) 11/29/2017  . Morbid obesity (HCC) 11/29/2017  . DUB (dysfunctional uterine bleeding) 08/23/2017  . Acanthosis nigricans 06/27/2017  . Latex allergy, contact dermatitis 01/13/2017  . Dermatitis 12/30/2016  . Bacterial vaginosis 10/14/2016  . Allergic rhinitis 09/23/2016  . Fever blister 08/21/2016  . Fatty liver 05/07/2016  . Foot pain, bilateral 03/31/2016  . Essential hypertension, benign 03/31/2016  . Proteinuria 01/30/2016  . Major depression 11/23/2015  . Vitamin D deficiency 11/19/2015  . Preventative health care 10/27/2015  . Medication monitoring encounter 10/27/2015  . Constipation, slow transit 02/18/2015  . Tonsil stone 02/18/2015  . Diabetes mellitus type 2 in obese (HCC) 11/26/2014  . Headache disorder 11/26/2014  . Arthralgia of multiple joints 11/26/2014    Past Medical History:  Diagnosis Date  . Anemia, iron deficiency, inadequate dietary intake 12/01/2014  . Depression    patient has not been officially diagnosed but has symptoms  . Diabetes mellitus without complication (HCC)   . Essential hypertension, benign 03/31/2016  . Irregular periods/menstrual cycles   . Joint pain of leg   . Major depression 11/23/2015    Past Surgical History:  Procedure Laterality Date   . CHOLECYSTECTOMY N/A 05/19/2016   Procedure: LAPAROSCOPIC CHOLECYSTECTOMY WITH INTRAOPERATIVE CHOLANGIOGRAM;  Surgeon: Nadeen LandauJarvis Wilton Smith, MD;  Location: [facility] ORS;  Service: General;  Laterality: N/A;    Social History   Tobacco Use  . Smoking status: Never Smoker  . Smokeless tobacco: Never Used  Substance Use Topics  . Alcohol use: Yes    Alcohol/week: 0.0 standard drinks    Comment: OCC     Current Outpatient Medications:  .  norgestimate-ethinyl estradiol (ORTHO-CYCLEN,SPRINTEC,PREVIFEM) 0.25-35 MG-MCG tablet, Take 1 tablet by mouth daily. , Disp: , Rfl:  .  metFORMIN (GLUCOPHAGE-XR) 500 MG 24 hr tablet, Take 1 tablet (500 mg total) by mouth daily. (Patient not taking: Reported on 06/11/2018), Disp: 30 tablet, Rfl: 5 .  nystatin (MYCOSTATIN/NYSTOP) powder, Apply topically 2 (two) times daily as needed., Disp: , Rfl:   Allergies  Allergen Reactions  . Latex Rash    Pruritic, urticaria    ROS   No other specific complaints in a complete review of systems (except as listed in HPI above).  Objective  Vitals:   06/11/18 1117  BP: 126/82  Pulse: (!) 105  Resp: 18  Temp: 98.9 F (37.2 C)  TempSrc: Oral  SpO2: 99%  Weight: 241 lb (109.3 kg)  Height: 5\' 7"  (1.702 m)    Body mass index is 37.75 kg/m.  Nursing Note and Vital Signs reviewed.  Physical Exam HENT:     Head: Normocephalic and atraumatic.     Right Ear: Hearing, tympanic membrane, ear canal and external ear normal.     Left Ear: Hearing, tympanic membrane, ear canal and external ear normal.  Nose: Congestion present.     Right Sinus: No maxillary sinus tenderness or frontal sinus tenderness.     Left Sinus: No maxillary sinus tenderness or frontal sinus tenderness.     Mouth/Throat:     Mouth: Mucous membranes are dry.     Pharynx: Oropharynx is clear. No oropharyngeal exudate.  Eyes:     General:        Right eye: No discharge.        Left eye: No discharge.     Conjunctiva/sclera:  Conjunctivae normal.     Pupils: Pupils are equal, round, and reactive to light.  Neck:     Musculoskeletal: Normal range of motion.  Cardiovascular:     Rate and Rhythm: Normal rate and regular rhythm.     Pulses: Normal pulses.  Pulmonary:     Effort: Pulmonary effort is normal.     Breath sounds: Normal breath sounds.  Abdominal:     Palpations: Abdomen is soft.     Tenderness: There is no abdominal tenderness.  Lymphadenopathy:     Cervical: No cervical adenopathy.  Skin:    General: Skin is warm and dry.     Findings: No rash.  Neurological:     Mental Status: She is alert.  Psychiatric:        Judgment: Judgment normal.     No results found for this or any previous visit (from the past 48 hour(s)).  Assessment & Plan  1. Nasal congestion Discussed OTC management. See AVS - POCT Influenza A/B  2. Cough Requested flu test, negative, discussed URI course  - POCT Influenza A/B - promethazine-dextromethorphan (PROMETHAZINE-DM) 6.25-15 MG/5ML syrup; Take 2.5 mLs by mouth 4 (four) times daily as needed for cough.  Dispense: 118 mL; Refill: 0  3. Urinary frequency - POCT urinalysis dipstick  4. Acute bilateral low back pain, unspecified whether sciatica present - POCT urinalysis dipstick  5. Acute cystitis with hematuria - nitrofurantoin, macrocrystal-monohydrate, (MACROBID) 100 MG capsule; Take 1 capsule (100 mg total) by mouth 2 (two) times daily.  Dispense: 10 capsule; Refill: 0

## 2018-06-11 NOTE — Patient Instructions (Addendum)
You have a urinary tract infection (UTI). Please take macrobid twice daily for the next 5 days. Can come in for a nurse visit only to recheck urine in 2 weeks to ensure it has resolved. Drink plenty of water- at least 64 ounces daily, avoid caffeine.   You likely have a viral upper respiratory infection (URI). Antibiotics will not reduce the number of days you are ill or prevent you from getting bacterial rhinosinusitis. A URI can take up to 14 days to resolve, but typically last between 7-11 days. Your body is so smart and strong that it will be fighting this illness off for you but it is important that you drink plenty of fluids, rest. Cover your nose/mouth when you cough or sneeze and wash your hands well and often. Here are some helpful things you can use or pick up over the counter from the pharmacy to help with your symptoms:   For Fever/Pain: Acetaminophen every 6 hours as needed (maximum of 3000mg  a day). If you are still uncomfortable you can add ibuprofen OR naproxen  For coughing: try dextromethorphan for a cough suppressant, and/or a cool mist humidifier, lozenges  For sore throat: saline gargles, honey herbal tea, lozenges, throat spray  To dry out your nose: try an antihistamine like loratadine (non-sedating) or diphenhydramine (sedating) or others To relieve a stuffy nose: try an oral decongestant  Like pseudoephedrine if you are under the age of 24 and do not have high blood pressure, neti pot To make blowing your nose easier: guaifenesin

## 2018-06-19 ENCOUNTER — Telehealth: Payer: Self-pay | Admitting: Family Medicine

## 2018-06-19 NOTE — Telephone Encounter (Signed)
Copied from CRM [id number]. Topic: General - Other >> Jun 19, 2018  3:57 PM Percival Spanish wrote:  Pt cal lto say the cough  medicine is not really helping and is asking if something else can be called in   promethazine-dextromethorphan (PROMETHAZINE-DM) 6.25-15 MG/5ML syrup   [facility] [address]   Tried calling patient in regards the above request.

## 2018-06-25 ENCOUNTER — Encounter: Payer: Self-pay | Admitting: Family Medicine

## 2018-06-26 ENCOUNTER — Ambulatory Visit (INDEPENDENT_AMBULATORY_CARE_PROVIDER_SITE_OTHER): Payer: 59 | Admitting: Nurse Practitioner

## 2018-06-26 ENCOUNTER — Encounter: Payer: Self-pay | Admitting: Nurse Practitioner

## 2018-06-26 VITALS — BP 126/84 | HR 87 | Temp 98.3°F | Resp 16 | Ht 67.0 in | Wt 241.7 lb

## 2018-06-26 DIAGNOSIS — R05 Cough: Secondary | ICD-10-CM

## 2018-06-26 DIAGNOSIS — J029 Acute pharyngitis, unspecified: Secondary | ICD-10-CM

## 2018-06-26 DIAGNOSIS — R059 Cough, unspecified: Secondary | ICD-10-CM

## 2018-06-26 LAB — POCT RAPID STREP A (OFFICE): RAPID STREP A SCREEN: NEGATIVE

## 2018-06-26 MED ORDER — BENZONATATE 200 MG PO CAPS: 200.0000 mg | ORAL_CAPSULE | Freq: Three times a day (TID) | ORAL | 0 refills | Status: DC | PRN

## 2018-06-26 MED ORDER — FAMOTIDINE 20 MG PO TABS: 20.0000 mg | ORAL_TABLET | Freq: Every day | ORAL | 0 refills | Status: DC

## 2018-06-26 MED ORDER — MAGIC MOUTHWASH W/LIDOCAINE: 5.0000 mL | Freq: Three times a day (TID) | ORAL | 0 refills | Status: DC | PRN

## 2018-06-26 NOTE — Progress Notes (Signed)
Name: Penny Gonzalez   MRN: [id number]    DOB: 04/12/1996   Date:06/26/2018       Progress Note  Subjective  Chief Complaint  Chief Complaint  Patient presents with  . Sore Throat    patient presents with sore throat since last visit. patient question if she has strep. hard to swallow. mostly on the left side.    HPI  Patient presents with sore throat ongoing for over 3 weeks. Has URI symptoms early December that had resolved and then returned late December. Since then- body aches, fatigue  And nasal congestion resolved. Cough still present but improved and sore throat is more painful worse on the left, worse in the morning.    Patient Active Problem List   Diagnosis Date Noted  . Controlled type 2 diabetes mellitus with hyperglycemia, without long-term current use of insulin (HCC) 11/29/2017  . Supraventricular tachycardia (HCC) 11/29/2017  . Morbid obesity (HCC) 11/29/2017  . DUB (dysfunctional uterine bleeding) 08/23/2017  . Acanthosis nigricans 06/27/2017  . Latex allergy, contact dermatitis 01/13/2017  . Dermatitis 12/30/2016  . Bacterial vaginosis 10/14/2016  . Allergic rhinitis 09/23/2016  . Fever blister 08/21/2016  . Fatty liver 05/07/2016  . Foot pain, bilateral 03/31/2016  . Essential hypertension, benign 03/31/2016  . Proteinuria 01/30/2016  . Major depression 11/23/2015  . Vitamin D deficiency 11/19/2015  . Preventative health care 10/27/2015  . Medication monitoring encounter 10/27/2015  . Constipation, slow transit 02/18/2015  . Tonsil stone 02/18/2015  . Diabetes mellitus type 2 in obese (HCC) 11/26/2014  . Headache disorder 11/26/2014  . Arthralgia of multiple joints 11/26/2014    Past Medical History:  Diagnosis Date  . Anemia, iron deficiency, inadequate dietary intake 12/01/2014  . Depression    patient has not been officially diagnosed but has symptoms  . Diabetes mellitus without complication (HCC)   . Essential hypertension, benign 03/31/2016   . Irregular periods/menstrual cycles   . Joint pain of leg   . Major depression 11/23/2015    Past Surgical History:  Procedure Laterality Date  . CHOLECYSTECTOMY N/A 05/19/2016   Procedure: LAPAROSCOPIC CHOLECYSTECTOMY WITH INTRAOPERATIVE CHOLANGIOGRAM;  Surgeon: Nadeen LandauJarvis Wilton Smith, MD;  Location: [facility] ORS;  Service: General;  Laterality: N/A;    Social History   Tobacco Use  . Smoking status: Never Smoker  . Smokeless tobacco: Never Used  Substance Use Topics  . Alcohol use: Yes    Alcohol/week: 0.0 standard drinks    Comment: OCC     Current Outpatient Medications:  .  norgestimate-ethinyl estradiol (ORTHO-CYCLEN,SPRINTEC,PREVIFEM) 0.25-35 MG-MCG tablet, Take 1 tablet by mouth daily. , Disp: , Rfl:  .  nystatin (MYCOSTATIN/NYSTOP) powder, Apply topically 2 (two) times daily as needed., Disp: , Rfl:  .  metFORMIN (GLUCOPHAGE-XR) 500 MG 24 hr tablet, Take 1 tablet (500 mg total) by mouth daily. (Patient not taking: Reported on 06/11/2018), Disp: 30 tablet, Rfl: 5 .  promethazine-dextromethorphan (PROMETHAZINE-DM) 6.25-15 MG/5ML syrup, Take 2.5 mLs by mouth 4 (four) times daily as needed for cough. (Patient not taking: Reported on 06/26/2018), Disp: 118 mL, Rfl: 0  Allergies  Allergen Reactions  . Latex Rash    Pruritic, urticaria    ROS   No other specific complaints in a complete review of systems (except as listed in HPI above).  Objective  Vitals:   06/26/18 0833  BP: 126/84  Pulse: 87  Resp: 16  Temp: 98.3 F (36.8 C)  TempSrc: Oral  SpO2: 99%  Weight: 241 lb 11.2 oz (  109.6 kg)  Height: 5\' 7"  (1.702 m)     Body mass index is 37.86 kg/m.  Nursing Note and Vital Signs reviewed.  Physical Exam Constitutional:      Appearance: She is well-developed.  HENT:     Head: Normocephalic and atraumatic.     Right Ear: Tympanic membrane and ear canal normal.     Left Ear: Tympanic membrane and ear canal normal.     Mouth/Throat:     Mouth: Mucous membranes  are moist.     Pharynx: Posterior oropharyngeal erythema present.  Eyes:     Conjunctiva/sclera: Conjunctivae normal.     Pupils: Pupils are equal, round, and reactive to light.  Cardiovascular:     Rate and Rhythm: Normal rate and regular rhythm.  Pulmonary:     Effort: Pulmonary effort is normal.     Breath sounds: Normal breath sounds.  Abdominal:     General: Bowel sounds are normal.     Palpations: Abdomen is soft.     Tenderness: There is no abdominal tenderness.  Lymphadenopathy:     Cervical: No cervical adenopathy.  Skin:    General: Skin is warm and dry.     Findings: No rash.  Neurological:     General: No focal deficit present.     Mental Status: She is alert and oriented to person, place, and time.  Psychiatric:        Mood and Affect: Mood normal.        Behavior: Behavior normal.       No results found for this or any previous visit (from the past 48 hour(s)).  Assessment & Plan  1. Sore throat - sore throat should improve first over the next few days as cough improves. Cough after an upper respiratory infection can last for up to a month- but should be slowly improving day by day. If you develop trouble swallowing, breathing or shortness of breath please seek immediate medical attention.  - POCT rapid strep A - magic mouthwash w/lidocaine SOLN; Take 5 mLs by mouth 3 (three) times daily as needed for mouth pain.  Dispense: 100 mL; Refill: 0 - famotidine (PEPCID) 20 MG tablet; Take 1 tablet (20 mg total) by mouth at bedtime.  Dispense: 30 tablet; Refill: 0  2. Cough For Cough: take musinex, tessalon perls and can try famotidine at night time to reduce acid build up over nigth For Sore throat: take magic mouth wash- swish in mouth for as long as tolerated and can swallow, take lozenges, drink warm fluids- honey lemon teas.  - benzonatate (TESSALON) 200 MG capsule; Take 1 capsule (200 mg total) by mouth 3 (three) times daily as needed for cough.  Dispense: 20  capsule; Refill: 0 - famotidine (PEPCID) 20 MG tablet; Take 1 tablet (20 mg total) by mouth at bedtime.  Dispense: 30 tablet; Refill: 0

## 2018-06-26 NOTE — Patient Instructions (Signed)
For Cough: take musinex, tessalon perls and can try famotidine at night time to reduce acid build up over nigth For Sore throat: take magic mouth wash- swish in mouth for as long as tolerated and can swallow, take lozenges, drink warm fluids- honey lemon teas.   - sore throat should improve first over the next few days as cough improves. Cough after an upper respiratory infection can last for up to a month- but should be slowly improving day by day. If you develop trouble swallowing, breathing or shortness of breath please seek immediate medical attention.

## 2018-06-26 NOTE — Telephone Encounter (Signed)
Patient was seen today by our NP

## 2018-07-03 ENCOUNTER — Ambulatory Visit: Payer: Self-pay | Admitting: *Deleted

## 2018-07-03 NOTE — Telephone Encounter (Signed)
Pt reports mild vaginal bleeding. States missed 3 doses of BC pill 2-3 weeks ago, spotting started 1 week ago. States varies between dark brown discharge to bloody "Strings." Wearing panty liner, at times only on tissue with wiping. Denies any pain. States has occurred before and Dr. Sherie Don called in a medication to stop the discharge. Does not recall name. Pt states she has not discussed with OB/GYN as she would need appt and can not afford to be seen. Also declines appt with Dr. Sherie Don for same reason. Pt also requesting medication called in for BV; states "If I'm bleeding I know that's next." States she had odor yesterday but has not noted today. Please advise: [phone]  Reason for Disposition . Using birth control pills  Answer Assessment - Initial Assessment Questions 1. AMOUNT: "Describe the bleeding that you are having."    - SPOTTING: spotting, or pinkish / brownish mucous discharge; does not fill panti-liner or pad    - MILD:  less than 1 pad / hour; less than patient's usual menstrual bleeding   - MODERATE: 1-2 pads / hour; 1 menstrual cup every 6 hours; small-medium blood clots (e.g., pea, grape, small coin)   - SEVERE: soaking 2 or more pads/hour for 2 or more hours; 1 menstrual cup every 2 hours; bleeding not contained by pads or continuous red blood from vagina; large blood clots (e.g., golf ball, large coin)      Mild, spotting "Strings" Wearing panty liner, at times only on tissue 2. ONSET: "When did the bleeding begin?" "Is it continuing now?"     1 week ago 3. MENSTRUAL PERIOD: "When was the last normal menstrual period?" "How is this different than your period?"     unsure 4. REGULARITY: "How regular are your periods?"     On BC, takes  continuously. 5. ABDOMINAL PAIN: "Do you have any pain?" "How bad is the pain?"  (e.g., Scale 1-10; mild, moderate, or severe)   - MILD (1-3): doesn't interfere with normal activities, abdomen soft and not tender to touch    - MODERATE (4-7):  interferes with normal activities or awakens from sleep, tender to touch    - SEVERE (8-10): excruciating pain, doubled over, unable to do any normal activities      none 6. PREGNANCY: "Could you be pregnant?" "Are you sexually active?" "Did you recently give birth?"     no 7. BREASTFEEDING: "Are you breastfeeding?"     no 8. HORMONES: "Are you taking any hormone medications, prescription or OTC?" (e.g., birth control pills, estrogen)     BC 9. BLOOD THINNERS: "Do you take any blood thinners?" (e.g., Coumadin/warfarin, Pradaxa/dabigatran, aspirin)     no 10. CAUSE: "What do you think is causing the bleeding?" (e.g., recent gyn surgery, recent gyn procedure; known bleeding disorder, cervical cancer, polycystic ovarian disease, fibroids)        Missed 3 doses of BC, "Has happened before 11. HEMODYNAMIC STATUS: "Are you weak or feeling lightheaded?" If so, ask: "Can you stand and walk normally?"        no 12. OTHER SYMPTOMS: "What other symptoms are you having with the bleeding?" (e.g., passed tissue, vaginal discharge, fever, menstrual-type cramps)       none  Protocols used: VAGINAL BLEEDING - ABNORMAL-A-AH

## 2018-07-03 NOTE — Telephone Encounter (Signed)
Pt notified and stated she will try to do an e visit and will call if she needs anything further from [place].

## 2018-07-03 NOTE — Telephone Encounter (Signed)
I would recommend a visit obviously If she refuses, then I believe this would be appropriate for an e-visit

## 2018-08-09 ENCOUNTER — Ambulatory Visit: Payer: Self-pay | Admitting: *Deleted

## 2018-08-09 NOTE — Telephone Encounter (Signed)
We don't prescribe her birth control She really needs to see her gynecologist for vaginal bleeding

## 2018-08-09 NOTE — Telephone Encounter (Signed)
Tried to call pt several times, Left detailed voicemial that she also needed to contact her GYN asap

## 2018-08-09 NOTE — Telephone Encounter (Signed)
Patient calls in with heavy vaginal bleeding after missing 2 days of Sprintec 0.25-35 mg-mcg tabs. Reports she started a new pack of the birth control 2 days ago and the bleeding started then as well. The bleeding has increased over the last 24 hours to dark red small clots and is using the overnight pads, changing about 1-1/1/2 hours this morning. She has menstrual cramping she rates a 4-5 at this time. Denies fever/dizziness/and her abdomen is not tender to touch. Denies pregnancy and sexual activity at this time. She has had this happen in the past when she has missed a pill but not to the extent of bleeding. No availability at gyn. Routing to PCP for appointment availability. Reviewed urgent symptoms requiring immediate evaluation including fever/increasing pain/bleeding/dizziness. Reason for Disposition . MODERATE vaginal bleeding (i.e., soaking 1 pad or tampon per hour and present > 6 hours; 1 menstrual cup every 6 hours)  Answer Assessment - Initial Assessment Questions 1. AMOUNT: "Describe the bleeding that you are having."    - SPOTTING: spotting, or pinkish / brownish mucous discharge; does not fill panti-liner or pad. Small clots and stringy dark bleeding.   - MILD:  less than 1 pad / hour; less than patient's usual menstrual bleeding   - MODERATE: 1-2 pads / hour; 1 menstrual cup every 6 hours; small-medium blood clots (e.g., pea, grape, small coin)   - SEVERE: soaking 2 or more pads/hour for 2 or more hours; 1 menstrual cup every 2 hours; bleeding not contained by pads or continuous red blood from vagina; large blood clots (e.g., golf ball, large coin)      Clots and stringy bloody discharge, she has gone thru 3 pads within 3 hours this morning.  2. ONSET: "When did the bleeding begin?" "Is it continuing now?"     Bleeding started Tuesday. She began a new set of birth control pills this day, also, after missing 2 days of pills. 3. MENSTRUAL PERIOD: "When was the last normal menstrual  period?" "How is this different than your period?"    Typically she doesn't have a period bc she is on active birth control pills everyday of the month. 4. REGULARITY: "How regular are your periods?"      5. ABDOMINAL PAIN: "Do you have any pain?" "How bad is the pain?"  (e.g., Scale 1-10; mild, moderate, or severe)   - MILD (1-3): doesn't interfere with normal activities, abdomen soft and not tender to touch    - MODERATE (4-7): interferes with normal activities or awakens from sleep, tender to touch    - SEVERE (8-10): excruciating pain, doubled over, unable to do any normal activities      4-5 but she is at work at this time. 6. PREGNANCY: "Could you be pregnant?" "Are you sexually active?" "Did you recently give birth?"     No to all she stated. 7. BREASTFEEDING: "Are you breastfeeding?"     no 8. HORMONES: "Are you taking any hormone medications, prescription or OTC?" (e.g., birth control pills, estrogen)     Birth control pills 9. BLOOD THINNERS: "Do you take any blood thinners?" (e.g., Coumadin/warfarin, Pradaxa/dabigatran, aspirin)     no 10. CAUSE: "What do you think is causing the bleeding?" (e.g., recent gyn surgery, recent gyn procedure; known bleeding disorder, cervical cancer, polycystic ovarian disease, fibroids)        Years ago, she was told she had a cyst on one of her ovaries.  11. HEMODYNAMIC STATUS: "Are you weak or feeling lightheaded?"  If so, ask: "Can you stand and walk normally?"       no 12. OTHER SYMPTOMS: "What other symptoms are you having with the bleeding?" (e.g., passed tissue, vaginal discharge, fever, menstrual-type cramps)       Headache and menstrual cramps  Protocols used: VAGINAL BLEEDING - ABNORMAL-A-AH

## 2018-08-09 NOTE — Telephone Encounter (Signed)
Spoke with pt and she scheduled appt for tomorrow with Lanora Manis. She would like for someone to give her a call if Dr Sherie Don gets a cancellation for this afternoon

## 2018-08-09 NOTE — Telephone Encounter (Signed)
Left another voicemail with patient to at least call and speak with someone at gyn to let them know whats going on and to go ahead and schedule an appt with them also

## 2018-08-10 ENCOUNTER — Ambulatory Visit: Payer: 59 | Admitting: Nurse Practitioner

## 2018-08-10 DIAGNOSIS — N939 Abnormal uterine and vaginal bleeding, unspecified: Secondary | ICD-10-CM | POA: Diagnosis not present

## 2018-08-10 DIAGNOSIS — R5382 Chronic fatigue, unspecified: Secondary | ICD-10-CM | POA: Diagnosis not present

## 2018-08-10 DIAGNOSIS — N898 Other specified noninflammatory disorders of vagina: Secondary | ICD-10-CM | POA: Diagnosis not present

## 2018-08-15 DIAGNOSIS — N939 Abnormal uterine and vaginal bleeding, unspecified: Secondary | ICD-10-CM | POA: Diagnosis not present

## 2018-08-20 ENCOUNTER — Ambulatory Visit: Payer: 59 | Admitting: Family Medicine

## 2018-09-04 ENCOUNTER — Ambulatory Visit: Payer: 59 | Admitting: Family Medicine

## 2018-10-04 ENCOUNTER — Ambulatory Visit: Payer: Self-pay | Admitting: Family Medicine

## 2018-10-18 ENCOUNTER — Other Ambulatory Visit: Payer: Self-pay

## 2018-10-18 ENCOUNTER — Ambulatory Visit (INDEPENDENT_AMBULATORY_CARE_PROVIDER_SITE_OTHER): Payer: 59 | Admitting: Nurse Practitioner

## 2018-10-18 ENCOUNTER — Encounter: Payer: Self-pay | Admitting: Nurse Practitioner

## 2018-10-18 VITALS — BP 145/104 | HR 87 | Ht 67.0 in | Wt 247.0 lb

## 2018-10-18 DIAGNOSIS — R030 Elevated blood-pressure reading, without diagnosis of hypertension: Secondary | ICD-10-CM | POA: Diagnosis not present

## 2018-10-18 DIAGNOSIS — E1169 Type 2 diabetes mellitus with other specified complication: Secondary | ICD-10-CM

## 2018-10-18 DIAGNOSIS — E669 Obesity, unspecified: Secondary | ICD-10-CM

## 2018-10-18 LAB — POCT GLYCOSYLATED HEMOGLOBIN (HGB A1C): Hemoglobin A1C: 9.2 % — AB (ref 4.0–5.6)

## 2018-10-18 NOTE — Patient Instructions (Addendum)
- Blood pressure goals to be under 140 on the top and 90 on the bottom- If they are consistently elevated please schedule sooner follow-up. Try to use PLAIN allergy medicine if you need it without the decongestant. Avoid: phenylephrine, phenylpropanolamine, and pseudoephredine, as these can increase blood pressure. Avoid NSAIDS- ibuprofen and naproxen.  - If you develop chest pain, blurry vision, severe headaches with any elevated blood pressure readings or severe elevations in blood pressure (typically greater than 180 on top or 110 on bottom please call 911.   DASH Eating Plan DASH stands for "Dietary Approaches to Stop Hypertension." The DASH eating plan is a healthy eating plan that has been shown to reduce high blood pressure (hypertension). It may also reduce your risk for type 2 diabetes, heart disease, and stroke. The DASH eating plan may also help with weight loss. What are tips for following this plan?  General guidelines  Avoid eating more than 2,300 mg (milligrams) of salt (sodium) a day. If you have hypertension, you may need to reduce your sodium intake to 1,500 mg a day.  Limit alcohol intake to no more than 1 drink a day for nonpregnant women and 2 drinks a day for men. One drink equals 12 oz of beer, 5 oz of wine, or 1 oz of hard liquor.  Work with your health care provider to maintain a healthy body weight or to lose weight. Ask what an ideal weight is for you.  Get at least 30 minutes of exercise that causes your heart to beat faster (aerobic exercise) most days of the week. Activities may include walking, swimming, or biking.  Work with your health care provider or diet and nutrition specialist (dietitian) to adjust your eating plan to your individual calorie needs. Reading food labels   Check food labels for the amount of sodium per serving. Choose foods with less than 5 percent of the Daily Value of sodium. Generally, foods with less than 300 mg of sodium per serving fit  into this eating plan.  To find whole grains, look for the word "whole" as the first word in the ingredient list. Shopping  Buy products labeled as "low-sodium" or "no salt added."  Buy fresh foods. Avoid canned foods and premade or frozen meals. Cooking  Avoid adding salt when cooking. Use salt-free seasonings or herbs instead of table salt or sea salt. Check with your health care provider or pharmacist before using salt substitutes.  Do not fry foods. Cook foods using healthy methods such as baking, boiling, grilling, and broiling instead.  Cook with heart-healthy oils, such as olive, canola, soybean, or sunflower oil. Meal planning  Eat a balanced diet that includes: ? 5 or more servings of fruits and vegetables each day. At each meal, try to fill half of your plate with fruits and vegetables. ? Up to 6-8 servings of whole grains each day. ? Less than 6 oz of lean meat, poultry, or fish each day. A 3-oz serving of meat is about the same size as a deck of cards. One egg equals 1 oz. ? 2 servings of low-fat dairy each day. ? A serving of nuts, seeds, or beans 5 times each week. ? Heart-healthy fats. Healthy fats called Omega-3 fatty acids are found in foods such as flaxseeds and coldwater fish, like sardines, salmon, and mackerel.  Limit how much you eat of the following: ? Canned or prepackaged foods. ? Food that is high in trans fat, such as fried foods. ? Food that  is high in saturated fat, such as fatty meat. ? Sweets, desserts, sugary drinks, and other foods with added sugar. ? Full-fat dairy products.  Do not salt foods before eating.  Try to eat at least 2 vegetarian meals each week.  Eat more home-cooked food and less restaurant, buffet, and fast food.  When eating at a restaurant, ask that your food be prepared with less salt or no salt, if possible. What foods are recommended? The items listed may not be a complete list. Talk with your dietitian about what dietary  choices are best for you. Grains Whole-grain or whole-wheat bread. Whole-grain or whole-wheat pasta. Brown rice. Orpah Cobb. Bulgur. Whole-grain and low-sodium cereals. Pita bread. Low-fat, low-sodium crackers. Whole-wheat flour tortillas. Vegetables Fresh or frozen vegetables (raw, steamed, roasted, or grilled). Low-sodium or reduced-sodium tomato and vegetable juice. Low-sodium or reduced-sodium tomato sauce and tomato paste. Low-sodium or reduced-sodium canned vegetables. Fruits All fresh, dried, or frozen fruit. Canned fruit in natural juice (without added sugar). Meat and other protein foods Skinless chicken or [place]. Ground chicken or [place]. Pork with fat trimmed off. Fish and seafood. Egg whites. Dried beans, peas, or lentils. Unsalted nuts, nut butters, and seeds. Unsalted canned beans. Lean cuts of beef with fat trimmed off. Low-sodium, lean deli meat. Dairy Low-fat (1%) or fat-free (skim) milk. Fat-free, low-fat, or reduced-fat cheeses. Nonfat, low-sodium ricotta or cottage cheese. Low-fat or nonfat yogurt. Low-fat, low-sodium cheese. Fats and oils Soft margarine without trans fats. Vegetable oil. Low-fat, reduced-fat, or light mayonnaise and salad dressings (reduced-sodium). Canola, safflower, olive, soybean, and sunflower oils. Avocado. Seasoning and other foods Herbs. Spices. Seasoning mixes without salt. Unsalted popcorn and pretzels. Fat-free sweets. What foods are not recommended? The items listed may not be a complete list. Talk with your dietitian about what dietary choices are best for you. Grains Baked goods made with fat, such as croissants, muffins, or some breads. Dry pasta or rice meal packs. Vegetables Creamed or fried vegetables. Vegetables in a cheese sauce. Regular canned vegetables (not low-sodium or reduced-sodium). Regular canned tomato sauce and paste (not low-sodium or reduced-sodium). Regular tomato and vegetable juice (not low-sodium or reduced-sodium).  Rosita Fire. Olives. Fruits Canned fruit in a light or heavy syrup. Fried fruit. Fruit in cream or butter sauce. Meat and other protein foods Fatty cuts of meat. Ribs. Fried meat. Tomasa Blase. Sausage. Bologna and other processed lunch meats. Salami. Fatback. Hotdogs. Bratwurst. Salted nuts and seeds. Canned beans with added salt. Canned or smoked fish. Whole eggs or egg yolks. Chicken or [place] with skin. Dairy Whole or 2% milk, cream, and half-and-half. Whole or full-fat cream cheese. Whole-fat or sweetened yogurt. Full-fat cheese. Nondairy creamers. Whipped toppings. Processed cheese and cheese spreads. Fats and oils Butter. Stick margarine. Lard. Shortening. Ghee. Bacon fat. Tropical oils, such as coconut, palm kernel, or palm oil. Seasoning and other foods Salted popcorn and pretzels. Onion salt, garlic salt, seasoned salt, table salt, and sea salt. Worcestershire sauce. Tartar sauce. Barbecue sauce. Teriyaki sauce. Soy sauce, including reduced-sodium. Steak sauce. Canned and packaged gravies. Fish sauce. Oyster sauce. Cocktail sauce. Horseradish that you find on the shelf. Ketchup. Mustard. Meat flavorings and tenderizers. Bouillon cubes. Hot sauce and Tabasco sauce. Premade or packaged marinades. Premade or packaged taco seasonings. Relishes. Regular salad dressings. Where to find more information:  National Heart, Lung, and Blood Institute: PopSteam.is  American Heart Association: www.heart.org Summary  The DASH eating plan is a healthy eating plan that has been shown to reduce high blood pressure (hypertension).  It may also reduce your risk for type 2 diabetes, heart disease, and stroke.  With the DASH eating plan, you should limit salt (sodium) intake to 2,300 mg a day. If you have hypertension, you may need to reduce your sodium intake to 1,500 mg a day.  When on the DASH eating plan, aim to eat more fresh fruits and vegetables, whole grains, lean proteins, low-fat dairy, and  heart-healthy fats.  Work with your health care provider or diet and nutrition specialist (dietitian) to adjust your eating plan to your individual calorie needs. This information is not intended to replace advice given to you by your health care provider. Make sure you discuss any questions you have with your health care provider. Document Released: 05/19/2011 Document Revised: 05/23/2016 Document Reviewed: 05/23/2016 Elsevier Interactive Patient Education  2019 [employer].

## 2018-10-18 NOTE — Progress Notes (Signed)
Virtual Visit via Video Note  I connected with Penny LowSamantha Gonzalez 10/18/18 at 10:00 AM EDT by a video enabled telemedicine application and verified that I am speaking with the correct person using two identifiers.   Staff discussed the limitations of evaluation and management by telemedicine and the availability of in person appointments. The patient expressed understanding and agreed to proceed.  Patient location: home  My location: home office Other people present: none HPI  Patient has noticed for the past week or two she has been getting headaches. She got a blood pressure cuff online because she thought it might be related. States has noticed that her blood pressure has been elevated- 142/101, 133/98,  130/96. Both her parents have hypertension. Does typically have a good amount of sodium in her diet but has been working to decreasing it and increasing water- has been eating a lot of soups. Cut out fast food. Limiting her saturated fats- no burgers, does eat [place] bacon  Occasionally active lifestyles- 2 times a week.  No NSAID or steroid use.  Does not smoke  No snoring or diagnosis of sleep apnea  Does not feel very stressed but thinks she might be  Drink alcohol socially  Does not use nasal sprays or OTC decongestants    Diabetes Patient is not taking metformin state she felt very weird on this medication. States she took it daily for about 2 weeks.  Has been drinking a lot more water and is more thirsty  Lab Results  Component Value Date   HGBA1C 6.8 (H) 11/29/2017     PH[facility] 10/18/2018 06/26/2018 06/11/2018 03/29/2018 11/29/2017  Decreased Interest 0 0 0 0 0  Down, Depressed, Hopeless 0 0 0 0 0  PHQ - 2 Score 0 0 0 0 0  Altered sleeping 0 0 1 0 -  Tired, decreased energy 0 0 1 0 -  Change in appetite 0 0 0 0 -  Feeling bad or failure about yourself  0 0 0 0 -  Trouble concentrating 0 0 1 0 -  Moving slowly or fidgety/restless 0 0 0 0 -   Suicidal thoughts 0 0 0 0 -  PHQ-9 Score 0 0 3 0 -  Difficult doing work/chores Not difficult at all Not difficult at all Somewhat difficult Not difficult at all -  Some recent data might be hidden    PHQ reviewed. Negative  Patient Active Problem List   Diagnosis Date Noted  . Controlled type 2 diabetes mellitus with hyperglycemia, without long-term current use of insulin (HCC) 11/29/2017  . Supraventricular tachycardia (HCC) 11/29/2017  . Morbid obesity (HCC) 11/29/2017  . DUB (dysfunctional uterine bleeding) 08/23/2017  . Acanthosis nigricans 06/27/2017  . Latex allergy, contact dermatitis 01/13/2017  . Dermatitis 12/30/2016  . Bacterial vaginosis 10/14/2016  . Allergic rhinitis 09/23/2016  . Fever blister 08/21/2016  . Fatty liver 05/07/2016  . Foot pain, bilateral 03/31/2016  . Essential hypertension, benign 03/31/2016  . Proteinuria 01/30/2016  . Major depression 11/23/2015  . Vitamin D deficiency 11/19/2015  . Preventative health care 10/27/2015  . Medication monitoring encounter 10/27/2015  . Constipation, slow transit 02/18/2015  . Tonsil stone 02/18/2015  . Diabetes mellitus type 2 in obese (HCC) 11/26/2014  . Headache disorder 11/26/2014  . Arthralgia of multiple joints 11/26/2014    Past Medical History:  Diagnosis Date  . Anemia, iron deficiency, inadequate dietary intake 12/01/2014  . Depression    patient has not been officially diagnosed but has  symptoms  . Diabetes mellitus without complication (HCC)   . Essential hypertension, benign 03/31/2016  . Irregular periods/menstrual cycles   . Joint pain of leg   . Major depression 11/23/2015    Past Surgical History:  Procedure Laterality Date  . CHOLECYSTECTOMY N/A 05/19/2016   Procedure: LAPAROSCOPIC CHOLECYSTECTOMY WITH INTRAOPERATIVE CHOLANGIOGRAM;  Surgeon: Nadeen Landau, MD;  Location: [facility] ORS;  Service: General;  Laterality: N/A;    Social History   Tobacco Use  . Smoking status: Never  Smoker  . Smokeless tobacco: Never Used  Substance Use Topics  . Alcohol use: Yes    Alcohol/week: 0.0 standard drinks    Comment: OCC     Current Outpatient Medications:  .  famotidine (PEPCID) 20 MG tablet, Take 1 tablet (20 mg total) by mouth at bedtime., Disp: 30 tablet, Rfl: 0 .  medroxyPROGESTERone (PROVERA) 10 MG tablet, Take 10 mg by mouth 2 (two) times daily., Disp: , Rfl:  .  norelgestromin-ethinyl estradiol (ORTHO EVRA) 150-35 MCG/24HR transdermal patch, Place onto the skin., Disp: , Rfl:  .  nystatin (MYCOSTATIN/NYSTOP) powder, Apply topically 2 (two) times daily as needed., Disp: , Rfl:  .  metFORMIN (GLUCOPHAGE-XR) 500 MG 24 hr tablet, Take 1 tablet (500 mg total) by mouth daily. (Patient not taking: Reported on 06/11/2018), Disp: 30 tablet, Rfl: 5  Allergies  Allergen Reactions  . Latex Rash    Pruritic, urticaria    ROS   No other specific complaints in a complete review of systems (except as listed in HPI above).  Objective  Vitals:   10/18/18 0957  BP: (!) 132/95  Pulse: 87  Weight: 247 lb (112 kg)  Height: 5\' 7"  (1.702 m)    Body mass index is 38.69 kg/m.  Nursing Note and Vital Signs reviewed.  Physical Exam  Constitutional: Patient appears well-developed and well-nourished. No distress.  HENT: Head: Normocephalic and atraumatic. Pulmonary/Chest: Effort normal  Neurological alert and oriented  speech and gait are normal.  Psychiatric: Patient has a normal mood and affect. behavior is normal. Judgment and thought content normal.    Assessment & Plan  1. Elevated blood pressure reading Discussed DASH in detail. Will come by office for in car BP reading and bring home cuff to ensure accuracy and proper use.  2. Diabetes mellitus type 2 in obese (HCC) Discussed diet, exercise and medications, will check levels drive-by in office today.  - POCT HgB A1C   A1C very elevated, staff will call and schedule follow-up appointment next week     Follow Up Instructions:   one month follow-up on BP, weight loss and diabetes   I discussed the assessment and treatment plan with the patient. The patient was provided an opportunity to ask questions and all were answered. The patient agreed with the plan and demonstrated an understanding of the instructions.   The patient was advised to call back or seek an in-person evaluation if the symptoms worsen or if the condition fails to improve as anticipated.  I provided  25 minutes of non-face-to-face time during this encounter.   Cheryle Horsfall, NP

## 2018-10-25 ENCOUNTER — Ambulatory Visit: Payer: 59 | Admitting: Nurse Practitioner

## 2018-10-25 ENCOUNTER — Other Ambulatory Visit: Payer: Self-pay

## 2018-10-25 ENCOUNTER — Encounter: Payer: Self-pay | Admitting: Nurse Practitioner

## 2018-10-25 NOTE — Progress Notes (Signed)
Appointment cancelled. 1 month follow up.

## 2018-11-14 ENCOUNTER — Ambulatory Visit (INDEPENDENT_AMBULATORY_CARE_PROVIDER_SITE_OTHER): Payer: 59 | Admitting: Nurse Practitioner

## 2018-11-14 ENCOUNTER — Encounter: Payer: Self-pay | Admitting: Nurse Practitioner

## 2018-11-14 ENCOUNTER — Other Ambulatory Visit: Payer: Self-pay

## 2018-11-14 VITALS — BP 122/72 | HR 99 | Temp 98.1°F | Resp 14 | Ht 68.0 in | Wt 245.4 lb

## 2018-11-14 DIAGNOSIS — I10 Essential (primary) hypertension: Secondary | ICD-10-CM | POA: Diagnosis not present

## 2018-11-14 DIAGNOSIS — E11628 Type 2 diabetes mellitus with other skin complications: Secondary | ICD-10-CM

## 2018-11-14 DIAGNOSIS — Z113 Encounter for screening for infections with a predominantly sexual mode of transmission: Secondary | ICD-10-CM

## 2018-11-14 DIAGNOSIS — E1165 Type 2 diabetes mellitus with hyperglycemia: Secondary | ICD-10-CM

## 2018-11-14 DIAGNOSIS — L02411 Cutaneous abscess of right axilla: Secondary | ICD-10-CM

## 2018-11-14 DIAGNOSIS — IMO0002 Reserved for concepts with insufficient information to code with codable children: Secondary | ICD-10-CM

## 2018-11-14 MED ORDER — AMOXICILLIN-POT CLAVULANATE 875-125 MG PO TABS: 1.0000 | ORAL_TABLET | Freq: Two times a day (BID) | ORAL | 0 refills | Status: DC

## 2018-11-14 MED ORDER — SEMAGLUTIDE 3 MG PO TABS: 3.0000 mg | ORAL_TABLET | Freq: Every day | ORAL | 0 refills | Status: DC

## 2018-11-14 MED ORDER — BLOOD GLUCOSE MONITOR KIT: PACK | 0 refills | Status: DC

## 2018-11-14 NOTE — Progress Notes (Signed)
Name: Penny Gonzalez   MRN: [id number]    DOB: 29-Apr-1996   Date:11/14/2018       Progress Note  Subjective  Chief Complaint  Chief Complaint  Patient presents with  . Annual Exam  . Skin Problem    Boil right armpit, 1 week ago    HPI Diabetes Mellitus POC A1C was 9.2 on 10/18/2018- recommended one week follow-up to discuss.  States has been drinking nothing but water for over the past month Has increased walking 3 days a week for an hour Has cut out a lot of sugars and sodium.  Extended release formula of metformin gave her constant nausea and diarrhea and she does not want to try this again. Patient is upset her A1C is so high, does not want to try any injectables. Has already tried nutrition counseling.   Father had thyroid issue that required thyroidectomy- called father and found out it was not cancerous.  No history of pancreatitis.   Hypertension Has been working on diet and exercising and blood pressure is much improved.  BP Readings from Last 3 Encounters:  11/14/18 122/72  10/25/18 127/90  10/18/18 (!) 145/104   Obesity Has been losing some weight slowly.  Wt Readings from Last 3 Encounters:  11/14/18 245 lb 6.4 oz (111.3 kg)  10/25/18 246 lb (111.6 kg)  10/18/18 247 lb (112 kg)    PHQ2/9: Depression screen [facility] 2/9 11/14/2018 10/25/2018 10/18/2018 06/26/2018 06/11/2018  Decreased Interest 0 0 0 0 0  Down, Depressed, Hopeless 0 0 0 0 0  PHQ - 2 Score 0 0 0 0 0  Altered sleeping 0 0 0 0 1  Tired, decreased energy 0 0 0 0 1  Change in appetite 0 0 0 0 0  Feeling bad or failure about yourself  0 0 0 0 0  Trouble concentrating 0 0 0 0 1  Moving slowly or fidgety/restless 0 0 0 0 0  Suicidal thoughts 0 0 0 0 0  PHQ-9 Score 0 0 0 0 3  Difficult doing work/chores Not difficult at all Not difficult at all Not difficult at all Not difficult at all Somewhat difficult  Some recent data might be hidden     PHQ reviewed. Negative  Patient Active Problem List   Diagnosis Date Noted  . Controlled type 2 diabetes mellitus with hyperglycemia, without long-term current use of insulin ([place]) 11/29/2017  . Supraventricular tachycardia ([place]) 11/29/2017  . Morbid obesity ([place]) 11/29/2017  . DUB (dysfunctional uterine bleeding) 08/23/2017  . Acanthosis nigricans 06/27/2017  . Latex allergy, contact dermatitis 01/13/2017  . Dermatitis 12/30/2016  . Bacterial vaginosis 10/14/2016  . Allergic rhinitis 09/23/2016  . Fever blister 08/21/2016  . Fatty liver 05/07/2016  . Foot pain, bilateral 03/31/2016  . Essential hypertension, benign 03/31/2016  . Proteinuria 01/30/2016  . Major depression 11/23/2015  . Vitamin D deficiency 11/19/2015  . Preventative health care 10/27/2015  . Medication monitoring encounter 10/27/2015  . Constipation, slow transit 02/18/2015  . Tonsil stone 02/18/2015  . Diabetes mellitus type 2 in obese ([place]) 11/26/2014  . Headache disorder 11/26/2014  . Arthralgia of multiple joints 11/26/2014    Past Medical History:  Diagnosis Date  . Anemia, iron deficiency, inadequate dietary intake 12/01/2014  . Depression    patient has not been officially diagnosed but has symptoms  . Diabetes mellitus without complication ([place])   . Essential hypertension, benign 03/31/2016  . Irregular periods/menstrual cycles   . Joint pain of leg   .  Major depression 11/23/2015    Past Surgical History:  Procedure Laterality Date  . CHOLECYSTECTOMY N/A 05/19/2016   Procedure: LAPAROSCOPIC CHOLECYSTECTOMY WITH INTRAOPERATIVE CHOLANGIOGRAM;  Surgeon: Leonie Green, MD;  Location: [facility] ORS;  Service: General;  Laterality: N/A;    Social History   Tobacco Use  . Smoking status: Never Smoker  . Smokeless tobacco: Never Used  Substance Use Topics  . Alcohol use: Yes    Alcohol/week: 0.0 standard drinks    Comment: OCC     Current Outpatient Medications:  .  nystatin (MYCOSTATIN/NYSTOP) powder, Apply topically 2 (two) times daily as  needed., Disp: , Rfl:  .  medroxyPROGESTERone (PROVERA) 10 MG tablet, Take 10 mg by mouth 2 (two) times daily., Disp: , Rfl:  .  metFORMIN (GLUCOPHAGE-XR) 500 MG 24 hr tablet, Take 1 tablet (500 mg total) by mouth daily. (Patient not taking: Reported on 06/11/2018), Disp: 30 tablet, Rfl: 5 .  norelgestromin-ethinyl estradiol (ORTHO EVRA) 150-35 MCG/24HR transdermal patch, Place onto the skin., Disp: , Rfl:   Allergies  Allergen Reactions  . Latex Rash    Pruritic, urticaria    ROS    No other specific complaints in a complete review of systems (except as listed in HPI above).  Objective  Vitals:   11/14/18 0919  BP: 122/72  Pulse: 99  Resp: 14  Temp: 98.1 F (36.7 C)  TempSrc: Oral  SpO2: 99%  Weight: 245 lb 6.4 oz (111.3 kg)  Height: '5\' 8"'$  (1.727 m)     Body mass index is 37.31 kg/m.  Nursing Note and Vital Signs reviewed.  Physical Exam HENT:     Head: Normocephalic and atraumatic.  Cardiovascular:     Rate and Rhythm: Normal rate.  Pulmonary:     Effort: Pulmonary effort is normal.  Skin:    General: Skin is warm and dry.       Neurological:     General: No focal deficit present.     Mental Status: She is alert and oriented to person, place, and time.  Psychiatric:        Mood and Affect: Mood normal.        Behavior: Behavior normal.       No results found for this or any previous visit (from the past 48 hour(s)).  Assessment & Plan  1. Uncontrolled type 2 diabetes mellitus with skin complication (HCC) Will start one medication at a time as patient is very overwhelmed at this time. Discussed checking fasting blood sugars 3 times a week to get an idea where she is at.  - Urine Microalbumin w/creat. ratio - Semaglutide (RYBELSUS) 3 MG TABS; Take 3 mg by mouth daily. First thing in the morning with less than 4 ounces of water  Dispense: 30 tablet; Refill: 0 - blood glucose meter kit and supplies KIT; Dispense based on patient and insurance  preference. Use up to four times daily as directed. (FOR ICD-9 250.00, 250.01).  Dispense: 1 each; Refill: 0 - Ambulatory referral to Chronic Care Management Services  2. Abscess of right axilla Warm compress - amoxicillin-clavulanate (AUGMENTIN) 875-125 MG tablet; Take 1 tablet by mouth 2 (two) times daily.  Dispense: 20 tablet; Refill: 0  3. Screening for STD (sexually transmitted disease) - RPR - HIV antibody (with reflex) - GC Probe amplification, urine  4. Essential hypertension, benign Well controlled  - Ambulatory referral to Chronic Care Management Services  5. Morbid obesity (HCC) BMI greater than 35 + diabetes + hypertension  -  Ambulatory referral to Chronic Care Management Services  Face-to-face time with patient was more than 25 minutes, >50% time spent counseling and coordination of care

## 2018-11-14 NOTE — Patient Instructions (Addendum)
Please take your antibiotic as prescribed with food on your stomach to prevent nausea and upset stomach. Take the antibiotic for the entire course, even if your symptoms resolve before the course if completed. Using antibiotics inappropriately can make it harder to treat future infections. If you have any concerning side effects please stop the medication and let us know immediately. I do recommend taking a probiotic to replenish your good gut health anytime you take an antibiotic. You can get probiotics in types of yogurt like activia, or other food/drinks such as kimchi, kombucha , sauerkraut, or you can take an over the counter supplement.    Diabetes Mellitus and Nutrition, Adult When you have diabetes (diabetes mellitus), it is very important to have healthy eating habits because your blood sugar (glucose) levels are greatly affected by what you eat and drink. Eating healthy foods in the appropriate amounts, at about the same times every day, can help you:  Control your blood glucose.  Lower your risk of heart disease.  Improve your blood pressure.  Reach or maintain a healthy weight. Every person with diabetes is different, and each person has different needs for a meal plan. Your health care provider may recommend that you work with a diet and nutrition specialist (dietitian) to make a meal plan that is best for you. Your meal plan may vary depending on factors such as:  The calories you need.  The medicines you take.  Your weight.  Your blood glucose, blood pressure, and cholesterol levels.  Your activity level.  Other health conditions you have, such as heart or kidney disease. How do carbohydrates affect me? Carbohydrates, also called carbs, affect your blood glucose level more than any other type of food. Eating carbs naturally raises the amount of glucose in your blood. Carb counting is a method for keeping track of how many carbs you eat. Counting carbs is important to keep  your blood glucose at a healthy level, especially if you use insulin or take certain oral diabetes medicines. It is important to know how many carbs you can safely have in each meal. This is different for every person. Your dietitian can help you calculate how many carbs you should have at each meal and for each snack. Foods that contain carbs include:  Bread, cereal, rice, pasta, and crackers.  Potatoes and corn.  Peas, beans, and lentils.  Milk and yogurt.  Fruit and juice.  Desserts, such as cakes, cookies, ice cream, and candy. How does alcohol affect me? Alcohol can cause a sudden decrease in blood glucose (hypoglycemia), especially if you use insulin or take certain oral diabetes medicines. Hypoglycemia can be a life-threatening condition. Symptoms of hypoglycemia (sleepiness, dizziness, and confusion) are similar to symptoms of having too much alcohol. If your health care provider says that alcohol is safe for you, follow these guidelines:  Limit alcohol intake to no more than 1 drink per day for nonpregnant women and 2 drinks per day for men. One drink equals 12 oz of beer, 5 oz of wine, or 1 oz of hard liquor.  Do not drink on an empty stomach.  Keep yourself hydrated with water, diet soda, or unsweetened iced tea.  Keep in mind that regular soda, juice, and other mixers may contain a lot of sugar and must be counted as carbs. What are tips for following this plan?  Reading food labels  Start by checking the serving size on the "Nutrition Facts" label of packaged foods and drinks. The amount  of calories, carbs, fats, and other nutrients listed on the label is based on one serving of the item. Many items contain more than one serving per package.  Check the total grams (g) of carbs in one serving. You can calculate the number of servings of carbs in one serving by dividing the total carbs by 15. For example, if a food has 30 g of total carbs, it would be equal to 2 servings  of carbs.  Check the number of grams (g) of saturated and trans fats in one serving. Choose foods that have low or no amount of these fats.  Check the number of milligrams (mg) of salt (sodium) in one serving. Most people should limit total sodium intake to less than 2,300 mg per day.  Always check the nutrition information of foods labeled as "low-fat" or "nonfat". These foods may be higher in added sugar or refined carbs and should be avoided.  Talk to your dietitian to identify your daily goals for nutrients listed on the label. Shopping  Avoid buying canned, premade, or processed foods. These foods tend to be high in fat, sodium, and added sugar.  Shop around the outside edge of the grocery store. This includes fresh fruits and vegetables, bulk grains, fresh meats, and fresh dairy. Cooking  Use low-heat cooking methods, such as baking, instead of high-heat cooking methods like deep frying.  Cook using healthy oils, such as olive, canola, or sunflower oil.  Avoid cooking with butter, cream, or high-fat meats. Meal planning  Eat meals and snacks regularly, preferably at the same times every day. Avoid going long periods of time without eating.  Eat foods high in fiber, such as fresh fruits, vegetables, beans, and whole grains. Talk to your dietitian about how many servings of carbs you can eat at each meal.  Eat 4-6 ounces (oz) of lean protein each day, such as lean meat, chicken, fish, eggs, or tofu. One oz of lean protein is equal to: ? 1 oz of meat, chicken, or fish. ? 1 egg. ?  cup of tofu.  Eat some foods each day that contain healthy fats, such as avocado, nuts, seeds, and fish. Lifestyle  Check your blood glucose regularly.  Exercise regularly as told by your health care provider. This may include: ? 150 minutes of moderate-intensity or vigorous-intensity exercise each week. This could be brisk walking, biking, or water aerobics. ? Stretching and doing strength  exercises, such as yoga or weightlifting, at least 2 times a week.  Take medicines as told by your health care provider.  Do not use any products that contain nicotine or tobacco, such as cigarettes and e-cigarettes. If you need help quitting, ask your health care provider.  Work with a Veterinary surgeoncounselor or diabetes educator to identify strategies to manage stress and any emotional and social challenges. Questions to ask a health care provider  Do I need to meet with a diabetes educator?  Do I need to meet with a dietitian?  What number can I call if I have questions?  When are the best times to check my blood glucose? Where to find more information:  American Diabetes Association: diabetes.org  Academy of Nutrition and Dietetics: www.eatright.[employer]org  National Institute of Diabetes and Digestive and Kidney Diseases (NIH): CarFlippers.tnwww.niddk.nih.gov Summary  A healthy meal plan will help you control your blood glucose and maintain a healthy lifestyle.  Working with a diet and nutrition specialist (dietitian) can help you make a meal plan that is best for you.  Keep in mind that carbohydrates (carbs) and alcohol have immediate effects on your blood glucose levels. It is important to count carbs and to use alcohol carefully. This information is not intended to replace advice given to you by your health care provider. Make sure you discuss any questions you have with your health care provider. Document Released: 02/24/2005 Document Revised: 12/28/2016 Document Reviewed: 07/04/2016 Elsevier Interactive Patient Education  2019 [employer].

## 2018-11-15 LAB — HIV ANTIBODY (ROUTINE TESTING W REFLEX): HIV 1&2 Ab, 4th Generation: NONREACTIVE

## 2018-11-15 LAB — MICROALBUMIN / CREATININE URINE RATIO
Creatinine, Urine: 110 mg/dL (ref 20–275)
Microalb Creat Ratio: 301 mcg/mg creat — ABNORMAL HIGH (ref <30)
Microalb, Ur: 33.1 mg/dL

## 2018-11-20 ENCOUNTER — Other Ambulatory Visit: Payer: Self-pay | Admitting: Nurse Practitioner

## 2018-11-20 DIAGNOSIS — R809 Proteinuria, unspecified: Secondary | ICD-10-CM

## 2018-11-21 ENCOUNTER — Ambulatory Visit: Payer: Self-pay

## 2018-11-21 ENCOUNTER — Telehealth: Payer: Self-pay | Admitting: Family Medicine

## 2018-11-21 DIAGNOSIS — E11628 Type 2 diabetes mellitus with other skin complications: Secondary | ICD-10-CM

## 2018-11-21 DIAGNOSIS — IMO0002 Reserved for concepts with insufficient information to code with codable children: Secondary | ICD-10-CM

## 2018-11-21 NOTE — Telephone Encounter (Signed)
I tried to contact patient to review Penny Gonzalez's message but there was no answer.  Information was left on her voicemail and I asked her to give [place] a call if she had any additional questions or concerns.  CRM was placed

## 2018-11-21 NOTE — Patient Instructions (Signed)
1. Thank You for allowing the CCM (Chronic Care Management) Team to assist you with your healthcare goals!! We look forward to speaking with you on 12/05/2018 at 9:00 2. Please bring ALL medications to your appointment! If you have a blood sugar meter or a blood pressure monitor at home, bring those as well.  3.  Contact the CCM Team if you have any question or need to reschedule your initial visit.  CCM (Chronic Care Management) Team   Trish Fountain RN, BSN Nurse Care Coordinator  [phone]  Ruben Reason PharmD  Clinical Pharmacist  [phone]   Elliot Gurney, LCSW Clinical Social Worker [phone]  Ms. Michie was given information about Care Management services today including:  1. Case Management services includes personalized support from designated clinical staff supervised by her physician, including individualized plan of care and coordination with other care providers 2. 24/7 contact phone numbers for assistance for urgent and routine care needs. 3. The patient may stop case management services at any time by phone call to the office staff.  Patient agreed to services and verbal consent obtained.

## 2018-11-21 NOTE — Chronic Care Management (AMB) (Signed)
  Care Management   Note  11/21/2018 Name: Penny Gonzalez MRN: [id number] DOB: [birth year]  [name] is a 23 year old female who sees Dr. Enid Derry for primary care. [name], NP asked the CCM team to consult the patient for DM education secondary to elevated A1C. Patient has a history of but not limited to Morbid obesity, DM2, and Major Depression. Referral was placed 11/14/2018 during last office visit. Telephone outreach to patient today to introduce CCM services.  SDOH (Social Determinants of Health) screening performed today. See Care Plan Entry related to challenges with: None  [name] was given information about Care Management services today including:  1. Case Management services include personalized support from designated clinical staff supervised by a physician, including individualized plan of care and coordination with other care providers 2. 24/7 contact phone numbers for assistance for urgent and routine care needs. 3. The patient may stop CCM services at any time (effective at the end of the month) by phone call to the office staff.   Patient agreed to services and verbal consent obtained.     Plan: Initial telephone visit with RN CM 12/05/2018 at 9:00    [place]. Rollene Rotunda, RN, BSN Nurse Care Coordinator [facility] Care Management  [phone]

## 2018-11-21 NOTE — Telephone Encounter (Signed)
Can drink while taking rybelsus. Do not recommend drinking on antibiotics due to worsening side effects of nausea and abdominal pain but severity depends on antibiotic. Do NOT drink if taking metronidazole until 48 hours after completing course

## 2018-11-21 NOTE — Telephone Encounter (Signed)
Copied from [place] [id number]. Topic: General - Other >> Nov 21, 2018  9:11 AM Lennox Solders wrote: Reason for CRM: pt is taking abx and also rybelsus and she would like to know if its ok to drink  some wine

## 2018-11-26 ENCOUNTER — Ambulatory Visit: Payer: 59 | Admitting: Nurse Practitioner

## 2018-11-27 ENCOUNTER — Telehealth: Payer: Self-pay

## 2018-11-27 NOTE — Telephone Encounter (Signed)
Pt notified that she is due for a pap this week and screening can be taken care of at her appt.    Copied from [place] [id number]. Topic: Quick Communication - See Telephone Encounter >> Nov 26, 2018  1:13 PM Blase Mess A wrote: CRM for notification. See Telephone encounter for: 11/26/18.  Patient is calling to inquire if she going to have a pap this week. Please advise Also requesting another std check [phone] (H)

## 2018-11-28 ENCOUNTER — Other Ambulatory Visit: Payer: Self-pay

## 2018-11-28 ENCOUNTER — Ambulatory Visit: Payer: Self-pay

## 2018-11-28 ENCOUNTER — Encounter: Payer: Self-pay | Admitting: Nurse Practitioner

## 2018-11-28 ENCOUNTER — Other Ambulatory Visit (HOSPITAL_COMMUNITY)
Admission: RE | Admit: 2018-11-28 | Discharge: 2018-11-28 | Disposition: A | Discharge status: Home / Self Care | Payer: 59 | Source: Ambulatory Visit | Attending: Nurse Practitioner | Admitting: Nurse Practitioner

## 2018-11-28 ENCOUNTER — Ambulatory Visit (INDEPENDENT_AMBULATORY_CARE_PROVIDER_SITE_OTHER): Payer: 59 | Admitting: Nurse Practitioner

## 2018-11-28 VITALS — BP 118/74 | HR 88 | Temp 98.9°F | Resp 14 | Ht 67.5 in | Wt 248.8 lb

## 2018-11-28 DIAGNOSIS — R300 Dysuria: Secondary | ICD-10-CM

## 2018-11-28 DIAGNOSIS — Z0000 Encounter for general adult medical examination without abnormal findings: Secondary | ICD-10-CM | POA: Insufficient documentation

## 2018-11-28 DIAGNOSIS — N3000 Acute cystitis without hematuria: Secondary | ICD-10-CM

## 2018-11-28 DIAGNOSIS — Z113 Encounter for screening for infections with a predominantly sexual mode of transmission: Secondary | ICD-10-CM

## 2018-11-28 DIAGNOSIS — R809 Proteinuria, unspecified: Secondary | ICD-10-CM

## 2018-11-28 DIAGNOSIS — B379 Candidiasis, unspecified: Secondary | ICD-10-CM

## 2018-11-28 DIAGNOSIS — Z7251 High risk heterosexual behavior: Secondary | ICD-10-CM | POA: Diagnosis not present

## 2018-11-28 DIAGNOSIS — Z124 Encounter for screening for malignant neoplasm of cervix: Secondary | ICD-10-CM | POA: Diagnosis present

## 2018-11-28 LAB — POCT URINALYSIS DIPSTICK
Bilirubin, UA: NEGATIVE
Blood, UA: NEGATIVE
Glucose, UA: NEGATIVE
Ketones, UA: NEGATIVE
Leukocytes, UA: NEGATIVE
Nitrite, UA: POSITIVE
Protein, UA: POSITIVE — AB
Spec Grav, UA: 1.01 (ref 1.010–1.025)
Urobilinogen, UA: NEGATIVE E.U./dL — AB
pH, UA: 8.5 — AB (ref 5.0–8.0)

## 2018-11-28 LAB — POCT URINE PREGNANCY: Preg Test, Ur: NEGATIVE

## 2018-11-28 MED ORDER — FLUCONAZOLE 150 MG PO TABS: 150.0000 mg | ORAL_TABLET | Freq: Once | ORAL | 0 refills | Status: AC

## 2018-11-28 MED ORDER — NITROFURANTOIN MONOHYD MACRO 100 MG PO CAPS: 100.0000 mg | ORAL_CAPSULE | Freq: Two times a day (BID) | ORAL | 0 refills | Status: DC

## 2018-11-28 NOTE — Telephone Encounter (Signed)
    Penny Gonzalez Female, 23 y.o., 02-16-1996 MRN:  [id number] Phone:  [phone] Penny Gonzalez) PCP:  Arnetha Courser, MD [facility] Other Next Appt With Internal Medicine 12/05/2018 at 9:00 AM Message from [place] sent at 11/28/2018 1:02 PM EDT  Summary: med question   Pt was given abx for uti and yeast infection, she wants to know when you take the diflucan. Or if another pill will be called in          Call History   Type Contact  11/28/2018 01:01 PM EDT Phone (Incoming) Penny Gonzalez (Self)  Phone: [phone] (H)  User: Penny Gonzalez  Encounter Report  Patient Encounter Report    Reason for Disposition . Caller has NON-URGENT medication question about med that PCP prescribed and triager unable to answer question  Answer Assessment - Initial Assessment Questions 1. SYMPTOMS: "Do you have any symptoms?"     Patient was given medication for UtI and yeast infection 2. SEVERITY: If symptoms are present, ask "Are they mild, moderate or severe?"     Wasn't aware of yeast infection.  Patient wants to know if she should take the yeast infection later after she has completed the UTI medication.  So she wont get another yeast.  Protocols used: MEDICATION QUESTION CALL-A-AH

## 2018-11-28 NOTE — Telephone Encounter (Signed)
She can go ahead and take the diflucan now, macrobid should not cause yeast infections. Based of her results today we may treat further infections. She will be called within a week for these results.

## 2018-11-28 NOTE — Progress Notes (Signed)
Name: KARELI HOSSAIN   MRN: [id number]    DOB: [birth year]   Date:11/28/2018       Progress Note  Subjective  Chief Complaint  Chief Complaint  Patient presents with  . Annual Exam    HPI  Patient presents for annual CPE.  Patient stopped taking antibiotic for abscess of axilla because she wanted to drink alcohol States she had intercourse on Sunday- condom broke, and is unsure if he ejaculated inside. She took a plan B. Patient endorses vaginal irritation since then.   Diet:  Patient states she started taking the rybelsus and has noticed decreased appetite, tries to eat small frequent meals but has not been that hungry. Eating more fish and salads now.  Drinking- has been drinking a lot of water, occasionally drinks a little bit of juice and sprite.   Exercise:  Has not been exercising this week due to rain but plans to start back on walking 3 times a week for an hour.   USPSTF grade A and B recommendations    Office Visit from 11/28/2018 in [facility]  AUDIT-C Score  2     Depression: Phq 9 is  negative Depression screen [facility] 2/9 11/28/2018 11/14/2018 10/25/2018 10/18/2018 06/26/2018  Decreased Interest 0 0 0 0 0  Down, Depressed, Hopeless 0 0 0 0 0  PHQ - 2 Score 0 0 0 0 0  Altered sleeping 0 0 0 0 0  Tired, decreased energy 0 0 0 0 0  Change in appetite 0 0 0 0 0  Feeling bad or failure about yourself  0 0 0 0 0  Trouble concentrating 0 0 0 0 0  Moving slowly or fidgety/restless 0 0 0 0 0  Suicidal thoughts 0 0 0 0 0  PHQ-9 Score 0 0 0 0 0  Difficult doing work/chores Not difficult at all Not difficult at all Not difficult at all Not difficult at all Not difficult at all  Some recent data might be hidden   Hypertension: BP Readings from Last 3 Encounters:  11/28/18 118/74  11/14/18 122/72  10/25/18 127/90   Obesity: Wt Readings from Last 3 Encounters:  11/28/18 248 lb 12.8 oz (112.9 kg)  11/14/18 245 lb 6.4 oz (111.3 kg)  10/25/18 246 lb  (111.6 kg)   BMI Readings from Last 3 Encounters:  11/28/18 38.39 kg/m  11/14/18 37.31 kg/m  10/25/18 37.40 kg/m    Hep C Screening: will complete today  STD testing and prevention (HIV/chl/gon/syphilis): will check today  Intimate partner violence: denies Sexual History/Pain during Intercourse: see above Menstrual History/LMP/Abnormal Bleeding: denies   Advanced Care Planning: A voluntary discussion about advance care planning including the explanation and discussion of advance directives.  Discussed health care proxy and Living will, and the patient was able to identify a health care proxy- does not know.  Patient does not have a living will at present time. If patient does have living will, I have requested they bring this to the clinic to be scanned in to their chart.  Breast cancer: discussed   Cervical cancer screening: due today   Lipids:  Lab Results  Component Value Date   CHOL 131 11/29/2017   CHOL 148 01/13/2017   CHOL 147 07/11/2016   Lab Results  Component Value Date   HDL 63 11/29/2017   HDL 82 01/13/2017   HDL 58 07/11/2016   Lab Results  Component Value Date   LDLCALC 49 11/29/2017   LDLCALC 45 01/13/2017  LDLCALC 59 07/11/2016   Lab Results  Component Value Date   TRIG 103 11/29/2017   TRIG 106 01/13/2017   TRIG 148 07/11/2016   Lab Results  Component Value Date   CHOLHDL 2.1 11/29/2017   CHOLHDL 1.8 01/13/2017   CHOLHDL 2.5 07/11/2016   No results found for: LDLDIRECT  Glucose:  Glucose  Date Value Ref Range Status  09/20/2014 144 (H) mg/dL Final    Comment:    65-99 NOTE: New Reference Range  08/19/14   07/04/2014 112 (H) 65 - 99 mg/dL Final  05/23/2014 85 65 - 99 mg/dL Final   Glucose, Bld  Date Value Ref Range Status  11/29/2017 93 65 - 99 mg/dL Final    Comment:    .            Fasting reference interval .   08/13/2017 135 (H) 65 - 99 mg/dL Final  02/22/2017 120 65 - 139 mg/dL Final    Comment:    .         Non-fasting reference interval .    Glucose-Capillary  Date Value Ref Range Status  05/19/2016 133 (H) 65 - 99 mg/dL Final  05/19/2016 119 (H) 65 - 99 mg/dL Final    Skin cancer: discussed    Patient Active Problem List   Diagnosis Date Noted  . Controlled type 2 diabetes mellitus with hyperglycemia, without long-term current use of insulin ([place]) 11/29/2017  . Supraventricular tachycardia ([place]) 11/29/2017  . Morbid obesity ([place]) 11/29/2017  . DUB (dysfunctional uterine bleeding) 08/23/2017  . Acanthosis nigricans 06/27/2017  . Latex allergy, contact dermatitis 01/13/2017  . Dermatitis 12/30/2016  . Bacterial vaginosis 10/14/2016  . Allergic rhinitis 09/23/2016  . Fever blister 08/21/2016  . Fatty liver 05/07/2016  . Foot pain, bilateral 03/31/2016  . Essential hypertension, benign 03/31/2016  . Proteinuria 01/30/2016  . Major depression 11/23/2015  . Vitamin D deficiency 11/19/2015  . Preventative health care 10/27/2015  . Medication monitoring encounter 10/27/2015  . Constipation, slow transit 02/18/2015  . Tonsil stone 02/18/2015  . Diabetes mellitus type 2 in obese ([place]) 11/26/2014  . Headache disorder 11/26/2014  . Arthralgia of multiple joints 11/26/2014    Past Surgical History:  Procedure Laterality Date  . CHOLECYSTECTOMY N/A 05/19/2016   Procedure: LAPAROSCOPIC CHOLECYSTECTOMY WITH INTRAOPERATIVE CHOLANGIOGRAM;  Surgeon: Leonie Green, MD;  Location: [facility] ORS;  Service: General;  Laterality: N/A;    Family History  Problem Relation Age of Onset  . Diabetes Mother   . Arthritis Father   . Diabetes Father   . Heart disease Father   . Hypertension Father   . Stroke Father   . Diabetes Maternal Grandmother   . Hypertension Maternal Grandmother   . Heart disease Paternal Grandmother   . Hypertension Paternal Grandmother   . Lupus Sister   . Deafness Sister     Social History   Socioeconomic History  . Marital status: Single    Spouse name:  Not on file  . Number of children: 1  . Years of education: Not on file  . Highest education level: High school graduate  Occupational History  . Not on file  Social Needs  . Financial resource strain: Not very hard  . Food insecurity    Worry: Never true    Inability: Never true  . Transportation needs    Medical: No    Non-medical: No  Tobacco Use  . Smoking status: Never Smoker  . Smokeless tobacco: Never Used  Substance  and Sexual Activity  . Alcohol use: Yes    Alcohol/week: 0.0 standard drinks    Comment: OCC  . Drug use: No  . Sexual activity: Yes    Partners: Male    Birth control/protection: Condom, Pill  Lifestyle  . Physical activity    Days per week: 3 days    Minutes per session: 40 min  . Stress: Rather much  Relationships  . Social connections    Talks on phone: More than three times a week    Gets together: Twice a week    Attends religious service: More than 4 times per year    Active member of club or organization: No    Attends meetings of clubs or organizations: Never    Relationship status: Never married  . Intimate partner violence    Fear of current or ex partner: No    Emotionally abused: No    Physically abused: No    Forced sexual activity: No  Other Topics Concern  . Not on file  Social History Narrative  . Not on file     Current Outpatient Medications:  .  Semaglutide (RYBELSUS) 3 MG TABS, Take 3 mg by mouth daily. First thing in the morning with less than 4 ounces of water, Disp: 30 tablet, Rfl: 0 .  amoxicillin-clavulanate (AUGMENTIN) 875-125 MG tablet, Take 1 tablet by mouth 2 (two) times daily. (Patient not taking: Reported on 11/28/2018), Disp: 20 tablet, Rfl: 0 .  blood glucose meter kit and supplies KIT, Dispense based on patient and insurance preference. Use up to four times daily as directed. (FOR ICD-9 250.00, 250.01)., Disp: 1 each, Rfl: 0 .  medroxyPROGESTERone (PROVERA) 10 MG tablet, Take 10 mg by mouth 2 (two) times  daily., Disp: , Rfl:  .  norelgestromin-ethinyl estradiol (ORTHO EVRA) 150-35 MCG/24HR transdermal patch, Place onto the skin., Disp: , Rfl:  .  nystatin (MYCOSTATIN/NYSTOP) powder, Apply topically 2 (two) times daily as needed., Disp: , Rfl:   Allergies  Allergen Reactions  . Latex Rash    Pruritic, urticaria     Review of Systems  Constitutional: Negative for chills, fever and malaise/fatigue.  HENT: Negative for congestion, sinus pain and sore throat.   Eyes: Negative for blurred vision and double vision.  Respiratory: Negative for cough and shortness of breath.   Cardiovascular: Negative for chest pain, palpitations and leg swelling.  Gastrointestinal: Negative for abdominal pain, blood in stool, constipation, diarrhea and nausea.  Genitourinary: Positive for dysuria and frequency. Negative for flank pain and hematuria.  Musculoskeletal: Negative for falls and joint pain.  Skin: Negative for rash.  Neurological: Positive for headaches (takes essential oils with relief). Negative for dizziness and tingling.  Endo/Heme/Allergies: Negative for polydipsia.  Psychiatric/Behavioral: The patient is not nervous/anxious and does not have insomnia.      Objective  Vitals:   11/28/18 1057  BP: 118/74  Pulse: 88  Resp: 14  Temp: 98.9 F (37.2 C)  TempSrc: Oral  SpO2: 98%  Weight: 248 lb 12.8 oz (112.9 kg)  Height: 5' 7.5" (1.715 m)    Body mass index is 38.39 kg/m.  Physical Exam   Recent Results (from the past 2160 hour(s))  POCT HgB A1C     Status: Abnormal   Collection Time: 10/18/18  1:11 PM  Result Value Ref Range   Hemoglobin A1C 9.2 (A) 4.0 - 5.6 %   HbA1c POC (<> result, manual entry)     HbA1c, POC (prediabetic range)  HbA1c, POC (controlled diabetic range)    HIV antibody (with reflex)     Status: None   Collection Time: 11/14/18  9:58 AM  Result Value Ref Range   HIV 1&2 Ab, 4th Generation NON-REACTIVE NON-REACTI    Comment: HIV-1 antigen and  HIV-1/HIV-2 antibodies were not detected. There is no laboratory evidence of HIV infection. [name] PLEASE NOTE: This information has been disclosed to you from records whose confidentiality may be protected by state law.  If your state requires such protection, then the state law prohibits you from making any further disclosure of the information without the specific written consent of the person to whom it pertains, or as otherwise permitted by law. A general authorization for the release of medical or other information is NOT sufficient for this purpose. . For additional information please refer to http://education.questdiagnostics.com/faq/FAQ106 (This link is being provided for informational/ educational purposes only.) . [name] The performance of this assay has not been clinically validated in patients less than 60 years old. [name]   Microalbumin / creatinine urine ratio     Status: Abnormal   Collection Time: 11/14/18  9:58 AM  Result Value Ref Range   Creatinine, Urine 110 20 - 275 mg/dL   Microalb, Ur 33.1 mg/dL    Comment: Verified by repeat analysis. [name] Reference Range Not established    Microalb Creat Ratio 301 (H) <30 mcg/mg creat    Comment: . The ADA defines abnormalities in albumin excretion as follows: [name] Category         Result (mcg/mg creatinine) . Normal                    <30 Microalbuminuria         30-299  Clinical albuminuria   > OR = 300 . The ADA recommends that at least two of three specimens collected within a 3-6 month period be abnormal before considering a patient to be within a diagnostic category.       Fall Risk: Fall Risk  11/28/2018 11/14/2018 10/25/2018 10/18/2018 06/26/2018  Falls in the past year? 0 0 0 0 0  Number falls in past yr: 0 - - 0 0  Injury with Fall? 0 - - 0 0     Functional Status Survey: Is the patient deaf or have difficulty hearing?: No Does the patient have difficulty seeing, even when wearing glasses/contacts?: No Does the  patient have difficulty concentrating, remembering, or making decisions?: No Does the patient have difficulty walking or climbing stairs?: No Does the patient have difficulty dressing or bathing?: No Does the patient have difficulty doing errands alone such as visiting a doctor's office or shopping?: No   Assessment & Plan 1. Preventative health care - Lipid Profile - RPR - POCT urine pregnancy - POCT Urinalysis Dipstick - COMPLETE METABOLIC PANEL WITH GFR - CBC - Hepatitis C Antibody - Cytology - PAP  2. Screening for STD (sexually transmitted disease) Declined HIV retesting - RPR - Hepatitis C Antibody - Cytology - PAP - Cervicovaginal ancillary only  3. Dysuria - POCT Urinalysis Dipstick  4. Unprotected sex - POCT urine pregnancy - Cytology - PAP  5. Screening for cervical cancer - Cytology - PAP   Patient declined endocrinology referral at this time- will work with [place] more closely to help manage diabetes. Discussed with her A1C it is recommended for triple therapy and/or insulin. She has been improving diet significantly and states she is willing to go to triple therapy without injectables  but would like to start slowly as she is overwhelmed. Will follow-up in 2 weeks. Discussed risks and benefits of delayed treatment.  -USPSTF grade A and B recommendations reviewed with patient; age-appropriate recommendations, preventive care, screening tests, etc discussed and encouraged; healthy living encouraged; see AVS for patient education given to patient -Discussed importance of 150 minutes of physical activity weekly, eat two servings of fish weekly, eat one serving of tree nuts ( cashews, pistachios, pecans, almonds.[name]) every other day, eat 6 servings of fruit/vegetables daily and drink plenty of water and avoid sweet beverages.

## 2018-11-29 LAB — COMPLETE METABOLIC PANEL WITH GFR
AG Ratio: 1.8 (calc) (ref 1.0–2.5)
ALT: 28 U/L (ref 6–29)
AST: 23 U/L (ref 10–30)
Albumin: 4.4 g/dL (ref 3.6–5.1)
Alkaline phosphatase (APISO): 66 U/L (ref 31–125)
BUN/Creatinine Ratio: 6 (calc) (ref 6–22)
BUN: 4 mg/dL — ABNORMAL LOW (ref 7–25)
CO2: 26 mmol/L (ref 20–32)
Calcium: 9.5 mg/dL (ref 8.6–10.2)
Chloride: 103 mmol/L (ref 98–110)
Creat: 0.63 mg/dL (ref 0.50–1.10)
GFR, Est African American: 144 mL/min/{1.73_m2} (ref >=60)
GFR, Est Non African American: 125 mL/min/{1.73_m2} (ref >=60)
Globulin: 2.4 g/dL (calc) (ref 1.9–3.7)
Glucose, Bld: 187 mg/dL — ABNORMAL HIGH (ref 65–99)
Potassium: 4 mmol/L (ref 3.5–5.3)
Sodium: 137 mmol/L (ref 135–146)
Total Bilirubin: 0.4 mg/dL (ref 0.2–1.2)
Total Protein: 6.8 g/dL (ref 6.1–8.1)

## 2018-11-29 LAB — CBC
HCT: 38.1 % (ref 35.0–45.0)
Hemoglobin: 12.7 g/dL (ref 11.7–15.5)
MCH: 27.9 pg (ref 27.0–33.0)
MCHC: 33.3 g/dL (ref 32.0–36.0)
MCV: 83.6 fL (ref 80.0–100.0)
MPV: 12.2 fL (ref 7.5–12.5)
Platelets: 268 10*3/uL (ref 140–400)
RBC: 4.56 10*6/uL (ref 3.80–5.10)
RDW: 13.7 % (ref 11.0–15.0)
WBC: 6.3 10*3/uL (ref 3.8–10.8)

## 2018-11-29 LAB — CYTOLOGY - PAP
Adequacy: ABSENT
Chlamydia: NEGATIVE
Diagnosis: NEGATIVE
Neisseria Gonorrhea: NEGATIVE
Trichomonas: NEGATIVE

## 2018-11-29 LAB — LIPID PANEL
Cholesterol: 151 mg/dL (ref <200)
HDL: 48 mg/dL — ABNORMAL LOW (ref >=50)
LDL Cholesterol (Calc): 79 mg/dL (calc)
Non-HDL Cholesterol (Calc): 103 mg/dL (calc) (ref <130)
Total CHOL/HDL Ratio: 3.1 (calc) (ref <5.0)
Triglycerides: 142 mg/dL (ref <150)

## 2018-11-29 LAB — HEPATITIS C ANTIBODY
Hepatitis C Ab: NONREACTIVE
SIGNAL TO CUT-OFF: 0.05 (ref <1.00)

## 2018-11-29 LAB — RPR: RPR Ser Ql: NONREACTIVE

## 2018-11-29 NOTE — Telephone Encounter (Signed)
Pt.notified

## 2018-11-30 ENCOUNTER — Ambulatory Visit: Payer: Self-pay

## 2018-11-30 LAB — CERVICOVAGINAL ANCILLARY ONLY
Bacterial vaginitis: NEGATIVE
Candida vaginitis: POSITIVE — AB
Chlamydia: NEGATIVE
Neisseria Gonorrhea: NEGATIVE
Trichomonas: NEGATIVE

## 2018-11-30 NOTE — Telephone Encounter (Signed)
My chart message snet

## 2018-11-30 NOTE — Telephone Encounter (Signed)
Patient called and says she took the [place] on 11/28/18 for the yeast infection and says she's still having the itching, no discharge. She says she was told to call back if it didn't help. I advised I will send this to [facility] and someone will call with her recommendation, she verbalized understanding.  Reason for Disposition . Caller has NON-URGENT medication question about med that PCP prescribed and triager unable to answer question  Protocols used: MEDICATION QUESTION CALL-A-AH

## 2018-12-03 ENCOUNTER — Other Ambulatory Visit: Payer: Self-pay | Admitting: Nurse Practitioner

## 2018-12-03 MED ORDER — FLUCONAZOLE 150 MG PO TABS: 150.0000 mg | ORAL_TABLET | Freq: Once | ORAL | 0 refills | Status: AC

## 2018-12-05 ENCOUNTER — Other Ambulatory Visit: Payer: Self-pay

## 2018-12-05 ENCOUNTER — Ambulatory Visit: Payer: Self-pay

## 2018-12-05 DIAGNOSIS — IMO0002 Reserved for concepts with insufficient information to code with codable children: Secondary | ICD-10-CM

## 2018-12-05 DIAGNOSIS — E11628 Type 2 diabetes mellitus with other skin complications: Secondary | ICD-10-CM

## 2018-12-05 NOTE — Chronic Care Management (AMB) (Signed)
  Care Management   Note  12/05/2018 Name: Penny Gonzalez MRN: [id number] DOB: Sep 09, 1995  Care Coordination: Successful telephone encounter to [name] as scheduled. [name] is a 23 year old female patient of [facility] who was referred to CCM RN CM for DM education by Penny Cheshire, NP. Her most recent A1C was 9.7. Today [name] was scheduled for her initial telephone visit/health assessment however she about the appointment. She admits not feeling well this morning. She believes she has undiagnosed IBS but does not feel she needs an acute appointment with her provider. She wishes to reschedule her appointment with RN CM.   Plan: Initial telephone assessment rescheduled for 12/19/2018 at 1:00   Penny Gonzalez E. Penny Rotunda, RN, BSN Nurse Care Coordinator [facility] Care Management  [phone]

## 2018-12-12 ENCOUNTER — Ambulatory Visit (INDEPENDENT_AMBULATORY_CARE_PROVIDER_SITE_OTHER): Payer: 59 | Admitting: Nurse Practitioner

## 2018-12-12 ENCOUNTER — Encounter: Payer: Self-pay | Admitting: Nurse Practitioner

## 2018-12-12 DIAGNOSIS — R809 Proteinuria, unspecified: Secondary | ICD-10-CM

## 2018-12-12 DIAGNOSIS — L304 Erythema intertrigo: Secondary | ICD-10-CM | POA: Diagnosis not present

## 2018-12-12 DIAGNOSIS — E11628 Type 2 diabetes mellitus with other skin complications: Secondary | ICD-10-CM | POA: Diagnosis not present

## 2018-12-12 DIAGNOSIS — F3341 Major depressive disorder, recurrent, in partial remission: Secondary | ICD-10-CM

## 2018-12-12 DIAGNOSIS — E1165 Type 2 diabetes mellitus with hyperglycemia: Secondary | ICD-10-CM

## 2018-12-12 DIAGNOSIS — IMO0002 Reserved for concepts with insufficient information to code with codable children: Secondary | ICD-10-CM

## 2018-12-12 MED ORDER — LOSARTAN POTASSIUM 25 MG PO TABS: 25.0000 mg | ORAL_TABLET | Freq: Every day | ORAL | 0 refills | Status: DC

## 2018-12-12 MED ORDER — NYSTATIN 100000 UNIT/GM EX POWD: Freq: Two times a day (BID) | CUTANEOUS | 0 refills | Status: DC | PRN

## 2018-12-12 MED ORDER — RYBELSUS 7 MG PO TABS: 7.0000 mg | ORAL_TABLET | Freq: Every day | ORAL | 2 refills | Status: DC

## 2018-12-12 MED ORDER — BLOOD GLUCOSE MONITOR KIT: PACK | 0 refills | Status: DC

## 2018-12-12 NOTE — Progress Notes (Signed)
Virtual Visit via Video Note  I connected with Penny Gonzalez  on 12/12/18 at 10:00 AM EDT by a video enabled telemedicine application and verified that I am speaking with the correct person using two identifiers.   Staff discussed the limitations of evaluation and management by telemedicine and the availability of in person appointments. The patient expressed understanding and agreed to proceed.  Patient location: home  My location: work office Other people present:  none HPI  Diabetes  Patient was very overwhelmed with diabetes diagnosis and had stopped taking all of her medications.  We started her on Rybelsus so she stated she was unable to tolerate metformin due to GI upset.  She states she has been drinking lots of water and has been working on her diet.  She did not pick up her glucometer states she lost the physical prescription so she has not been checking her blood sugars.  She wants to stay on the 3 mg dose discussed this is not a therapeutic dosage and it is likely she does not have any glycemic control still.  Patient has not made an eye doctor appointment.  She does not check her feet.  Patient states she does not want to be put on more medications at this time however we discussed that she is spilling protein from her urine and it is important that we start her on a medication for nephro protection from diabetes.  She states she does have a blood pressure monitor at home has been monitoring it occasionally at home and is been well controlled under 120/80.  Denies lightheadedness or dizziness, chest pain, palpitations.  Patient endorses intertrigo underneath breasts uses nystatin powder with relief of symptoms.  Denies polyphagia, denies polyuria, mild polydipsia Lab Results  Component Value Date   HGBA1C 9.2 (A) 10/18/2018   BP Readings from Last 3 Encounters:  11/28/18 118/74  11/14/18 122/72  10/25/18 127/90     PHQ2/9: Depression screen PHQ 2/9 12/12/2018 11/28/2018  11/14/2018 10/25/2018 10/18/2018  Decreased Interest 0 0 0 0 0  Down, Depressed, Hopeless 1 0 0 0 0  PHQ - 2 Score 1 0 0 0 0  Altered sleeping 0 0 0 0 0  Tired, decreased energy 0 0 0 0 0  Change in appetite 0 0 0 0 0  Feeling bad or failure about yourself  0 0 0 0 0  Trouble concentrating 0 0 0 0 0  Moving slowly or fidgety/restless 0 0 0 0 0  Suicidal thoughts 0 0 0 0 0  PHQ-9 Score 1 0 0 0 0  Difficult doing work/chores Not difficult at all Not difficult at all Not difficult at all Not difficult at all Not difficult at all  Some recent data might be hidden     PHQ reviewed. Negative  Patient Active Problem List   Diagnosis Date Noted  . Controlled type 2 diabetes mellitus with hyperglycemia, without long-term current use of insulin ([place]) 11/29/2017  . Supraventricular tachycardia ([place]) 11/29/2017  . Morbid obesity ([place]) 11/29/2017  . DUB (dysfunctional uterine bleeding) 08/23/2017  . Acanthosis nigricans 06/27/2017  . Latex allergy, contact dermatitis 01/13/2017  . Dermatitis 12/30/2016  . Bacterial vaginosis 10/14/2016  . Allergic rhinitis 09/23/2016  . Fever blister 08/21/2016  . Fatty liver 05/07/2016  . Foot pain, bilateral 03/31/2016  . Essential hypertension, benign 03/31/2016  . Proteinuria 01/30/2016  . Major depression 11/23/2015  . Vitamin D deficiency 11/19/2015  . Preventative health care 10/27/2015  . Medication monitoring encounter  10/27/2015  . Constipation, slow transit 02/18/2015  . Tonsil stone 02/18/2015  . Diabetes mellitus type 2 in obese ([place]) 11/26/2014  . Headache disorder 11/26/2014  . Arthralgia of multiple joints 11/26/2014    Past Medical History:  Diagnosis Date  . Anemia, iron deficiency, inadequate dietary intake 12/01/2014  . Depression    patient has not been officially diagnosed but has symptoms  . Diabetes mellitus without complication ([place])   . Essential hypertension, benign 03/31/2016  . Irregular periods/menstrual cycles   .  Joint pain of leg   . Major depression 11/23/2015    Past Surgical History:  Procedure Laterality Date  . CHOLECYSTECTOMY N/A 05/19/2016   Procedure: LAPAROSCOPIC CHOLECYSTECTOMY WITH INTRAOPERATIVE CHOLANGIOGRAM;  Surgeon: Leonie Green, MD;  Location: [facility] ORS;  Service: General;  Laterality: N/A;    Social History   Tobacco Use  . Smoking status: Never Smoker  . Smokeless tobacco: Never Used  Substance Use Topics  . Alcohol use: Yes    Alcohol/week: 0.0 standard drinks    Comment: OCC     Current Outpatient Medications:  .  nystatin (MYCOSTATIN/NYSTOP) powder, Apply topically 2 (two) times daily as needed., Disp: , Rfl:  .  Semaglutide (RYBELSUS) 3 MG TABS, Take 3 mg by mouth daily. First thing in the morning with less than 4 ounces of water, Disp: 30 tablet, Rfl: 0 .  blood glucose meter kit and supplies KIT, Dispense based on patient and insurance preference. Use up to four times daily as directed. (FOR ICD-9 250.00, 250.01)., Disp: 1 each, Rfl: 0 .  medroxyPROGESTERone (PROVERA) 10 MG tablet, Take 10 mg by mouth 2 (two) times daily., Disp: , Rfl:  .  nitrofurantoin, macrocrystal-monohydrate, (MACROBID) 100 MG capsule, Take 1 capsule (100 mg total) by mouth 2 (two) times daily. (Patient not taking: Reported on 12/12/2018), Disp: 10 capsule, Rfl: 0  Allergies  Allergen Reactions  . Latex Rash    Pruritic, urticaria    ROS   No other specific complaints in a complete review of systems (except as listed in HPI above).  Objective  There were no vitals filed for this visit.   There is no height or weight on file to calculate BMI.  Nursing Note and Vital Signs reviewed.  Physical Exam   Constitutional: Patient appears well-developed and well-nourished. No distress.  HENT: Head: Normocephalic and atraumatic. Pulmonary/Chest: Effort normal  Musculoskeletal: Normal range of motion,  Neurological: alert and oriented, speech normal.  Skin: No rash noted. No  erythema.  Psychiatric: Patient has a normal mood and affect. behavior is normal. Judgment and thought content normal.    Assessment & Plan  1. Uncontrolled type 2 diabetes mellitus with skin complication ([place]) Discussed diabetes in length and importance of getting improved glycemic control.  Reprinted and will fax glucometer to her pharmacy so she can pick up strips to monitor fasting blood sugars at least 3 times a week to get an idea of where her blood sugar ranges.  Our goal for her fasting blood sugars at this time would be under 180.  We will likely add on second and third agent for improved glycemic control.  Discussed with patient if unable to get improved control will refer to endocrinology. - Semaglutide (RYBELSUS) 7 MG TABS; Take 7 mg by mouth daily.  Dispense: 30 tablet; Refill: 2 - blood glucose meter kit and supplies KIT; Dispense based on patient and insurance preference. Use up to four times daily as directed. (FOR ICD-9 250.00, 250.01).  Dispense: 1 each; Refill: 0 - losartan (COZAAR) 25 MG tablet; Take 1 tablet (25 mg total) by mouth daily.  Dispense: 90 tablet; Refill: 0  2. Microalbuminuria - losartan (COZAAR) 25 MG tablet; Take 1 tablet (25 mg total) by mouth daily.  Dispense: 90 tablet; Refill: 0  3. Recurrent major depressive disorder, in partial remission ([place]) Much improved at this time is not on any medication  4. Intertrigo - nystatin (MYCOSTATIN/NYSTOP) powder; Apply topically 2 (two) times daily as needed.  Dispense: 45 g; Refill: 0    Follow Up Instructions:   2 months   I discussed the assessment and treatment plan with the patient. The patient was provided an opportunity to ask questions and all were answered. The patient agreed with the plan and demonstrated an understanding of the instructions.   The patient was advised to call back or seek an in-person evaluation if the symptoms worsen or if the condition fails to improve as anticipated.  I provided  22 minutes of non-face-to-face time during this encounter.   Fredderick Severance, NP

## 2018-12-19 ENCOUNTER — Other Ambulatory Visit: Payer: Self-pay

## 2018-12-19 ENCOUNTER — Ambulatory Visit: Payer: Self-pay

## 2018-12-19 DIAGNOSIS — E11628 Type 2 diabetes mellitus with other skin complications: Secondary | ICD-10-CM

## 2018-12-19 DIAGNOSIS — IMO0002 Reserved for concepts with insufficient information to code with codable children: Secondary | ICD-10-CM

## 2018-12-19 NOTE — Chronic Care Management (AMB) (Signed)
Care Management   Initial Visit Note  12/19/2018 Name: Penny Gonzalez MRN: [id number] DOB: 08/02/1995   Subjective: "I just think I need a second opinion because I don't see how my A1C went up when I am trying to eat healthy"  Objective:  Lab Results  Component Value Date   HGBA1C 9.2 (A) 10/18/2018   HGBA1C 6.8 (H) 11/29/2017   HGBA1C 7.1 (H) 08/23/2017   Lab Results  Component Value Date   MICROALBUR 33.1 11/14/2018   LDLCALC 79 11/28/2018   CREATININE 0.63 11/28/2018    Assessment: Penny Gonzalez is a 23 year old female patient of [facility] who was referred to CCM RN CM for DM education by [name], NP. Her most recent A1C was 9.7. Today RN CM assessed patient for self care/management needs related to her diabetes.  Review of patient status, including review of consultants reports, relevant laboratory and other test results, and collaboration with appropriate care team members and the patient's provider was performed as part of comprehensive patient evaluation and provision of chronic care management services.    Goals Addressed            This Visit's Progress   . I don't understand why my A1C went up because I changed my diet (pt-stated)       Penny Gonzalez is very overwhelmed with her diabetes diagnosis. She does not believe her A1C of 9.2 is accurate as she did not fast prior to testing AND she has completely turned to what she believes a healthy diet. As her diet was discussed, RN CM discovered that she indeed is eating a "healthy diet" however that means she is consuming a lot of carbohydrates including fruits, oatmeal, whole grain breads, starchy vegetables. She was under the impression that only sugary foods/drinks would increase your blood sugars. She has not picked up her new prescriptions or glucometer. She continues to take 3 mg of semaglutide as she has not picked up her new prescription of 7mg . She states she will pick up glucometer and  medications this afternoon.   [name] is very receptive to education. After talking with RN CM, she now understands how her A1C can be elevated even if she was eating "healthy". She request any educational information RN CM can provide via email.  Current Barriers:  [name] Knowledge Deficits related to basic Diabetes pathophysiology and self care/management . Lack of personal glucometer to monitor blood sugar . 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 Provided education to patient re: diabetes . Reviewed medications with patient and discussed importance of medication adherence . Discussed plans with patient for ongoing care management follow up and provided patient with direct contact information for care management team . Provided patient with written educational materials related to hypo and hyperglycemia and importance of correct treatment . Reviewed scheduled/upcoming provider appointments including:  . Advised patient, providing education and rationale  . Patient Self Care Activities:  . Self administers medications as prescribed . Attends all scheduled provider appointments . Checks blood sugars as prescribed and utilize hyper and hypoglycemia protocol as needed . Adhere to ADA diet as discussed  Plan:  . Patient will pick up medications/glucometer today and begin checking fasting sugars daily . RNCM  will provide educational materials via email   Initial goal documentation         Follow up plan:  Telephone follow up appointment with care management team member scheduled for: 2 weeks     Thersea Manfredonia E. Suzie PortelaPayne, RN, BSN Nurse Care Coordinator [facility]  Management  [phone]

## 2018-12-19 NOTE — Patient Instructions (Addendum)
Thank you allowing the Chronic Care Management Team to be a part of your care! It was a pleasure speaking with you today!  1. Please pick up all your medications from the pharmacy and take as prescribed. 2. Check your blood sugars each morning before you eat. This will be the most accurate fasting blood sugar. Write down the numbers and we will discuss in 2 weeks. 3. I am so proud of you for the changes you have made so far!! 4. You can have 45-60 grams of carbohydrates at each meal. You can have 15-30 grams of carbohydrates for an evening snack. Remember that "meat" is a protein and does not run your sugars up. You need to have protein with each meal. 5. A good substitute for a meal is the premier brand shakes at [employer]. This may be easy for a grab and go. 6. Please read the materials below and call me with any questions you may have. 7. Please check your email for additional education from [facility]. I bet you will find it helpful.  CCM (Chronic Care Management) Team   Trish Fountain RN, BSN Nurse Care Coordinator  [phone]  Ruben Reason PharmD  Clinical Pharmacist  [phone]   Elliot Gurney, LCSW Clinical Social Worker [phone]  Goals Addressed            This Visit's Progress   . I don't understand why my A1C went up because I changed my diet (pt-stated)       Current Barriers:  [name] Knowledge Deficits related to basic Diabetes pathophysiology and self care/management . Lack of personal glucometer to monitor blood sugar . 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 Provided education to patient re: diabetes . Reviewed medications with patient and discussed importance of  medication adherence . Discussed plans with patient for ongoing care management follow up and provided patient with direct contact information for care management team . Provided patient with written educational materials related to hypo and hyperglycemia and importance of correct treatment . Reviewed scheduled/upcoming provider appointments including:  . Advised patient, providing education and rationale  . Patient Self Care Activities:  . Self administers medications as prescribed . Attends all scheduled provider appointments . Checks blood sugars as prescribed and utilize hyper and hypoglycemia protocol as needed . Adhere to ADA diet as discussed  Plan:  . Patient will pick up medications/glucometer today and begin checking fasting sugars daily . RNCM will provide educational materials via email   Initial goal documentation        Print copy of patient instructions provided.   Telephone follow up appointment with care management team member scheduled for: 2 weeks  SYMPTOMS OF A STROKE   You have any symptoms of stroke. "BE FAST" is an easy way to remember the main warning signs: ? B - Balance. Signs are dizziness, sudden trouble walking, or loss of balance. ? E - Eyes. Signs are trouble seeing or a sudden change in how you see. ? F - Face. Signs are sudden weakness or loss of feeling of the face, or the face or eyelid drooping on one side. ? A - Arms. Signs are weakness or loss of feeling in an arm. This happens suddenly and usually  on one side of the body. ? S - Speech. Signs are sudden trouble speaking, slurred speech, or trouble understanding what people say. ? T - Time. Time to call emergency services. Write down what time symptoms started.  You have other signs of stroke, such as: ? A sudden, very bad headache with no known cause. ? Feeling sick to your stomach (nausea). ? Throwing up (vomiting). ? Jerky movements you cannot control (seizure).  SYMPTOMS OF A HEART  ATTACK  What are the signs or symptoms? Symptoms of this condition include:  Chest pain. It may feel like: ? Crushing or squeezing. ? Tightness, pressure, fullness, or heaviness.  Pain in the arm, neck, jaw, back, or upper body.  Shortness of breath.  Heartburn.  Indigestion.  Nausea.  Cold sweats.  Feeling tired.  Sudden lightheadedness.   Type 2 Diabetes Mellitus, Self Care, Adult When you have type 2 diabetes (type 2 diabetes mellitus), you must make sure your blood sugar (glucose) stays in a healthy range. You can do this with:  Nutrition.  Exercise.  Lifestyle changes.  Medicines or insulin, if needed.  Support from your doctors and others.  How to stay aware of blood sugar  Check your blood sugar level every day, asoften as told.  Have your A1c (hemoglobin A1c) level checked two or more times a year. Have it checked more often if your doctor tells you to. Your doctor will set personal treatment goals for you. Generally, you should have these blood sugar levels:  Before meals (preprandial): 80-130 mg/dL (4.4-7.2 mmol/L).  After meals (postprandial): below 180 mg/dL (10 mmol/L).  A1c level: less than 7%. BECAUSE YOU ARE SO YOUNG, I WOULD LIKE YOUR A1C TO BE 6.0-6.5  How to manage high and low blood sugar Signs of high blood sugar High blood sugar is called hyperglycemia. Know the signs of high blood sugar. Signs may include:  Feeling: ? Thirsty. ? Hungry. ? Very tired.  Needing to pee (urinate) more than usual.  Blurry vision.  Signs of low blood sugar Low blood sugar is called hypoglycemia. This is when blood sugar is at or below 70 mg/dL (3.9 mmol/L). Signs may include:  Feeling: ? Hungry. ? Worried or nervous (anxious). ? Sweaty and clammy. ? Confused. ? Dizzy. ? Sleepy. ? Sick to your stomach (nauseous).  Having: ? A fast heartbeat. ? A headache. ? A change in your vision. ? Jerky movements that you cannot control (seizure). ?  Tingling or no feeling (numbness) around your mouth, lips, or tongue.  Having trouble with: ? Moving (coordination). ? Sleeping. ? Passing out (fainting). ? Getting upset easily (irritability). Treating low blood sugar To treat low blood sugar, eat or drink something sugary right away. If you can think clearly and swallow safely, follow the 15:15 rule:  Take 15 grams of a fast-acting carb (carbohydrate). Talk with your doctor about how much you should take.  Some fast-acting carbs are: ? Sugar tablets (glucose pills). Take 3-4 pills. ? 6-8 pieces of hard candy. ? 4-6 oz (120-150 mL) of fruit juice. ? 4-6 oz (120-150 mL) of regular (not diet) soda. ? 1 Tbsp (15 mL) honey or sugar.  Check your blood sugar 15 minutes after you take the carb.  If your blood sugar is still at or below 70 mg/dL (3.9 mmol/L), take 15 grams of a carb again.  If your blood sugar does not go above 70 mg/dL (3.9 mmol/L) after 3 tries, get help right away.  After your  blood sugar goes back to normal, eat a meal or a snack within 1 hour. Treating very low blood sugar If your blood sugar is at or below 54 mg/dL (3 mmol/L), you have very low blood sugar (severe hypoglycemia). This is an emergency. Do not wait to see if the symptoms will go away. Get medical help right away. Call your local emergency services (911 in the [place] If you have very low blood sugar and you cannot eat or drink, you may need a glucagon shot (injection). A family member or friend should learn how to check your blood sugar and how to give you a glucagon shot. Ask your doctor if you need to have a glucagon shot kit at home. Follow these instructions at home: Medicine  Take insulin and diabetes medicines as told.  If your doctor says you should take more or less insulin and medicines, do this exactly as told.  Do not run out of insulin or medicines. Having diabetes can raise your risk for other long-term conditions. These include heart  disease and kidney disease. Your doctor may prescribe medicines to help you not have these problems.  Food   Make healthy food choices. These include: ? Chicken, fish, egg whites, and beans. ? Oats, whole wheat, bulgur, brown rice, quinoa, and millet. ? Fresh fruits and vegetables. ? Low-fat dairy products. ? Nuts, avocado, olive oil, and canola oil.  Meet with a food specialist (dietitian). He or she can help you make an eating plan that is right for you.  Follow instructions from your doctor about what you cannot eat or drink.  Drink enough fluid to keep your pee (urine) pale yellow.  Keep track of carbs that you eat. Do this by reading food labels and learning food serving sizes.  Follow your sick day plan when you cannot eat or drink normally. Make this plan with your doctor so it is ready to use.  Activity  Exercise 3 or more times a week.  Do not go more than 2 days without exercising.  Talk with your doctor before you start a new exercise. Your doctor may need to tell you to change: ? How much insulin or medicines you take. ? How much food you eat. Lifestyle  Do not use any tobacco products. These include cigarettes, chewing tobacco, and e-cigarettes. If you need help quitting, ask your doctor.  Ask your doctor how much alcohol is safe for you.  Learn to deal with stress. If you need help with this, ask your doctor.  Body care  Stay up to date with your shots (immunizations).  Have your eyes and feet checked by a doctor as often as told.  Check your skin and feet every day. Check forcuts, bruises, redness, blisters, or sores.  Brush your teeth and gums two times a day. Floss one or more times a day.  Go to the dentist one or more times every 6 months.  Stay at a healthy weight.  General instructions  Take over-the-counter and prescription medicines only as told by your doctor.  Share your diabetes care plan with: ? Your work or school. ? People you  live with.  Carry a card or wear jewelry that says you have diabetes.  Keep all follow-up visits as told by your doctor. This is important. Questions to ask your doctor  Do I need to meet with a diabetes educator?  Where can I find a support group for people with diabetes? Where to find more information To  learn more about diabetes, visit:  American Diabetes Association: www.diabetes.org  American Association of Diabetes Educators: www.diabeteseducator.org Summary  When you have type 2 diabetes, you must make sure your blood sugar (glucose) stays in a healthy range.  Check your blood sugar every day, as often as told.  Having diabetes can raise your risk for other conditions. Your doctor may prescribe medicines to help you not have these problems.  Keep all follow-up visits as told by your doctor. This is important.  Carbohydrate Counting for Diabetes Mellitus, Adult  Carbohydrate counting is a method of keeping track of how many carbohydrates you eat. Eating carbohydrates naturally increases the amount of sugar (glucose) in the blood. Counting how many carbohydrates you eat helps keep your blood glucose within normal limits, which helps you manage your diabetes (diabetes mellitus) It is important to know how many carbohydrates you can safely have in each meal. This is different for every person. A diet and nutrition specialist (registered dietitian) can help you make a meal plan and calculate how many carbohydrates you should have at each meal and snack. Carbohydrates are found in the following foods:  Grains, such as breads and cereals.  Dried beans and soy products.  Starchy vegetables, such as potatoes, peas, and corn.  Fruit and fruit juices.  Milk and yogurt.  Sweets and snack foods, such as cake, cookies, candy, chips, and soft drinks. How do I count carbohydrates? There are two ways to count carbohydrates in food. You can use either of the methods or a combination  of both. Reading "Nutrition Facts" on packaged food The "Nutrition Facts" list is included on the labels of almost all packaged foods and beverages in the [place] It includes:  The serving size.  Information about nutrients in each serving, including the grams (g) of carbohydrate per serving. To use the "Nutrition Facts":  Decide how many servings you will have.  Multiply the number of servings by the number of carbohydrates per serving.  The resulting number is the total amount of carbohydrates that you will be having. Learning standard serving sizes of other foods When you eat carbohydrate foods that are not packaged or do not include "Nutrition Facts" on the label, you need to measure the servings in order to count the amount of carbohydrates:  Measure the foods that you will eat with a food scale or measuring cup, if needed.  Decide how many standard-size servings you will eat.  Multiply the number of servings by 15. Most carbohydrate-rich foods have about 15 g of carbohydrates per serving. ? For example, if you eat 8 oz (170 g) of strawberries, you will have eaten 2 servings and 30 g of carbohydrates (2 servings x 15 g = 30 g).  For foods that have more than one food mixed, such as soups and casseroles, you must count the carbohydrates in each food that is included. The following list contains standard serving sizes of common carbohydrate-rich foods. Each of these servings has about 15 g of carbohydrates:   hamburger bun or  English muffin.   oz (15 mL) syrup.   oz (14 g) jelly.  1 slice of bread.  1 six-inch tortilla.  3 oz (85 g) cooked rice or pasta.  4 oz (113 g) cooked dried beans.  4 oz (113 g) starchy vegetable, such as peas, corn, or potatoes.  4 oz (113 g) hot cereal.  4 oz (113 g) mashed potatoes or  of a large baked potato.  4 oz (113  g) canned or frozen fruit.  4 oz (120 mL) fruit juice.  4-6 crackers.  6 chicken nuggets.  6 oz (170 g)  unsweetened dry cereal.  6 oz (170 g) plain fat-free yogurt or yogurt sweetened with artificial sweeteners.  8 oz (240 mL) milk.  8 oz (170 g) fresh fruit or one small piece of fruit.  24 oz (680 g) popped popcorn.  Example of carbohydrate counting Sample meal  3 oz (85 g) chicken breast.  6 oz (170 g) brown rice.  4 oz (113 g) corn.  8 oz (240 mL) milk.  8 oz (170 g) strawberries with sugar-free whipped topping. Carbohydrate calculation 1. Identify the foods that contain carbohydrates: ? Rice. ? Corn. ? Milk. ? Strawberries. 2. Calculate how many servings you have of each food: ? 2 servings rice. ? 1 serving corn. ? 1 serving milk. ? 1 serving strawberries. 3. Multiply each number of servings by 15 g: ? 2 servings rice x 15 g = 30 g. ? 1 serving corn x 15 g = 15 g. ? 1 serving milk x 15 g = 15 g. ? 1 serving strawberries x 15 g = 15 g. 4. Add together all of the amounts to find the total grams of carbohydrates eaten: ? 30 g + 15 g + 15 g + 15 g = 75 g of carbohydrates total.

## 2018-12-27 ENCOUNTER — Other Ambulatory Visit
Admission: RE | Admit: 2018-12-27 | Discharge: 2018-12-27 | Disposition: A | Discharge status: Home / Self Care | Payer: 59 | Source: Ambulatory Visit | Attending: Student | Admitting: Student

## 2018-12-27 DIAGNOSIS — K529 Noninfective gastroenteritis and colitis, unspecified: Secondary | ICD-10-CM | POA: Insufficient documentation

## 2018-12-27 LAB — GASTROINTESTINAL PANEL BY PCR, STOOL (REPLACES STOOL CULTURE)

## 2018-12-27 LAB — C DIFFICILE QUICK SCREEN W PCR REFLEX
C Diff antigen: NEGATIVE
C Diff interpretation: NOT DETECTED
C Diff toxin: NEGATIVE

## 2018-12-31 ENCOUNTER — Telehealth: Payer: Self-pay | Admitting: Family Medicine

## 2018-12-31 NOTE — Telephone Encounter (Signed)
Error not needed

## 2019-01-01 LAB — PANCREATIC ELASTASE, FECAL: Pancreatic Elastase-1, Stool: >500 ug Elast./g (ref >200)

## 2019-01-01 LAB — CALPROTECTIN, FECAL: Calprotectin, Fecal: <16 ug/g (ref 0–120)

## 2019-01-28 ENCOUNTER — Ambulatory Visit: Payer: Self-pay

## 2019-01-28 NOTE — Chronic Care Management (AMB) (Signed)
    Care Management   Unsuccessful Call Note 01/28/2019 Name: Penny Gonzalez MRN: [id number] DOB: Jul 10, 1995  Penny Gonzalez is a 23 year old female patient of [facility] who was referred to CCM RN CM for DM education by [name], NP. Her most recent A1C was 9.7. Initial education was provided 12/19/2018.   Was unable to reach patient via telephone today for DM follow up. I have left HIPAA compliant voicemail asking patient to return my call. (unsuccessful outreach #[phone]).   Plan: Patient has been provided CCM Contact information and encourage to return call to discuss DM self care plan adherence. Will await return call     Marcy Bogosian E. Rollene Rotunda, RN, BSN Nurse Care Coordinator [facility] Care Management  [phone]

## 2019-02-13 LAB — HM DIABETES EYE EXAM

## 2019-04-04 ENCOUNTER — Other Ambulatory Visit: Payer: Self-pay

## 2019-04-04 DIAGNOSIS — E11628 Type 2 diabetes mellitus with other skin complications: Secondary | ICD-10-CM

## 2019-04-04 DIAGNOSIS — IMO0002 Reserved for concepts with insufficient information to code with codable children: Secondary | ICD-10-CM

## 2019-04-05 MED ORDER — RYBELSUS 7 MG PO TABS: 7.0000 mg | ORAL_TABLET | Freq: Every day | ORAL | 0 refills | Status: DC

## 2019-04-10 ENCOUNTER — Other Ambulatory Visit: Payer: Self-pay

## 2019-04-10 ENCOUNTER — Ambulatory Visit (INDEPENDENT_AMBULATORY_CARE_PROVIDER_SITE_OTHER): Payer: 59 | Admitting: Family Medicine

## 2019-04-10 ENCOUNTER — Encounter: Payer: Self-pay | Admitting: Family Medicine

## 2019-04-10 ENCOUNTER — Other Ambulatory Visit (HOSPITAL_COMMUNITY)
Admission: RE | Admit: 2019-04-10 | Discharge: 2019-04-10 | Disposition: A | Discharge status: Home / Self Care | Payer: 59 | Source: Ambulatory Visit | Attending: Family Medicine | Admitting: Family Medicine

## 2019-04-10 VITALS — BP 122/84 | HR 89 | Temp 98.1°F | Resp 14 | Ht 68.0 in | Wt 230.0 lb

## 2019-04-10 DIAGNOSIS — M549 Dorsalgia, unspecified: Secondary | ICD-10-CM

## 2019-04-10 DIAGNOSIS — IMO0002 Reserved for concepts with insufficient information to code with codable children: Secondary | ICD-10-CM

## 2019-04-10 DIAGNOSIS — R350 Frequency of micturition: Secondary | ICD-10-CM | POA: Diagnosis not present

## 2019-04-10 DIAGNOSIS — Z7251 High risk heterosexual behavior: Secondary | ICD-10-CM

## 2019-04-10 DIAGNOSIS — Z113 Encounter for screening for infections with a predominantly sexual mode of transmission: Secondary | ICD-10-CM

## 2019-04-10 DIAGNOSIS — E11628 Type 2 diabetes mellitus with other skin complications: Secondary | ICD-10-CM

## 2019-04-10 DIAGNOSIS — Z5181 Encounter for therapeutic drug level monitoring: Secondary | ICD-10-CM

## 2019-04-10 DIAGNOSIS — E1165 Type 2 diabetes mellitus with hyperglycemia: Secondary | ICD-10-CM

## 2019-04-10 DIAGNOSIS — R140 Abdominal distension (gaseous): Secondary | ICD-10-CM

## 2019-04-10 DIAGNOSIS — R3915 Urgency of urination: Secondary | ICD-10-CM | POA: Insufficient documentation

## 2019-04-10 LAB — POCT URINALYSIS DIPSTICK
Bilirubin, UA: NEGATIVE
Blood, UA: NEGATIVE
Glucose, UA: NEGATIVE
Ketones, UA: NEGATIVE
Nitrite, UA: NEGATIVE
Odor: NORMAL
Protein, UA: NEGATIVE
Spec Grav, UA: 1.02 (ref 1.010–1.025)
Urobilinogen, UA: 0.2 E.U./dL
pH, UA: 6 (ref 5.0–8.0)

## 2019-04-10 MED ORDER — METOCLOPRAMIDE HCL 5 MG PO TABS: 5.0000 mg | ORAL_TABLET | Freq: Three times a day (TID) | ORAL | 1 refills | Status: DC | PRN

## 2019-04-10 NOTE — Patient Instructions (Addendum)
We will call you with the urine culture results If negative we can try some meds for over active bladder or refer you to urology for assessment   We can try some medicines for your stomach.   Overactive Bladder, Adult  Overactive bladder refers to a condition in which a person has a sudden need to pass urine. The person may leak urine if he or she cannot get to the bathroom fast enough (urinary incontinence). A person with this condition may also wake up several times in the night to go to the bathroom. Overactive bladder is associated with poor nerve signals between your bladder and your brain. Your bladder may get the signal to empty before it is full. You may also have very sensitive muscles that make your bladder squeeze too soon. These symptoms might interfere with daily work or social activities. What are the causes? This condition may be associated with or caused by:  Urinary tract infection.  Infection of nearby tissues, such as the prostate.  Prostate enlargement.  Surgery on the uterus or urethra.  Bladder stones, inflammation, or tumors.  Drinking too much caffeine or alcohol.  Certain medicines, especially medicines that get rid of extra fluid in the body (diuretics).  Muscle or nerve weakness, especially from: ? A spinal cord injury. ? Stroke. ? Multiple sclerosis. ? Parkinson's disease.  Diabetes.  Constipation. What increases the risk? You may be at greater risk for overactive bladder if you:  Are an older adult.  Smoke.  Are going through menopause.  Have prostate problems.  Have a neurological disease, such as stroke, dementia, Parkinson's disease, or multiple sclerosis (MS).  Eat or drink things that irritate the bladder. These include alcohol, spicy food, and caffeine.  Are overweight or obese. What are the signs or symptoms? Symptoms of this condition include:  Sudden, strong urge to urinate.  Leaking urine.  Urinating 8 or more times a  day.  Waking up to urinate 2 or more times a night. How is this diagnosed? Your health care provider may suspect overactive bladder based on your symptoms. He or she will diagnose this condition by:  A physical exam and medical history.  Blood or urine tests. You might need bladder or urine tests to help determine what is causing your overactive bladder. You might also need to see a health care provider who specializes in urinary tract problems (urologist). How is this treated? Treatment for overactive bladder depends on the cause of your condition and whether it is mild or severe. You can also make lifestyle changes at home. Options include:  Bladder training. This may include: ? Learning to control the urge to urinate by following a schedule that directs you to urinate at regular intervals (timed voiding). ? Doing Kegel exercises to strengthen your pelvic floor muscles, which support your bladder. Toning these muscles can help you control urination, even if your bladder muscles are overactive.  Special devices. This may include: ? Biofeedback, which uses sensors to help you become aware of your body's signals. ? Electrical stimulation, which uses electrodes placed inside the body (implanted) or outside the body. These electrodes send gentle pulses of electricity to strengthen the nerves or muscles that control the bladder. ? Women may use a plastic device that fits into the vagina and supports the bladder (pessary).  Medicines. ? Antibiotics to treat bladder infection. ? Antispasmodics to stop the bladder from releasing urine at the wrong time. ? Tricyclic antidepressants to relax bladder muscles. ? Injections of  botulinum toxin type A directly into the bladder tissue to relax bladder muscles.  Lifestyle changes. This may include: ? Weight loss. Talk to your health care provider about weight loss methods that would work best for you. ? Diet changes. This may include reducing how much  alcohol and caffeine you consume, or drinking fluids at different times of the day. ? Not smoking. Do not use any products that contain nicotine or tobacco, such as cigarettes and e-cigarettes. If you need help quitting, ask your health care provider.  Surgery. ? A device may be implanted to help manage the nerve signals that control urination. ? An electrode may be implanted to stimulate electrical signals in the bladder. ? A procedure may be done to change the shape of the bladder. This is done only in very severe cases. Follow these instructions at home: Lifestyle  Make any diet or lifestyle changes that are recommended by your health care provider. These may include: ? Drinking less fluid or drinking fluids at different times of the day. ? Cutting down on caffeine or alcohol. ? Doing Kegel exercises. ? Losing weight if needed. ? Eating a healthy and balanced diet to prevent constipation. This may include:  Eating foods that are high in fiber, such as fresh fruits and vegetables, whole grains, and beans.  Limiting foods that are high in fat and processed sugars, such as fried and sweet foods. General instructions  Take over-the-counter and prescription medicines only as told by your health care provider.  If you were prescribed an antibiotic medicine, take it as told by your health care provider. Do not stop taking the antibiotic even if you start to feel better.  Use any implants or pessary as told by your health care provider.  If needed, wear pads to absorb urine leakage.  Keep a journal or log to track how much and when you drink and when you feel the need to urinate. This will help your health care provider monitor your condition.  Keep all follow-up visits as told by your health care provider. This is important. Contact a health care provider if:  You have a fever.  Your symptoms do not get better with treatment.  Your pain and discomfort get worse.  You have more  frequent urges to urinate. Get help right away if:  You are not able to control your bladder. Summary  Overactive bladder refers to a condition in which a person has a sudden need to pass urine.  Several conditions may lead to an overactive bladder.  Treatment for overactive bladder depends on the cause and severity of your condition.  Follow your health care provider's instructions about lifestyle changes, doing Kegel exercises, keeping a journal, and taking medicines. This information is not intended to replace advice given to you by your health care provider. Make sure you discuss any questions you have with your health care provider. Document Released: 03/26/2009 Document Revised: 09/20/2018 Document Reviewed: 06/15/2017 Elsevier Patient Education  2020 [employer]   Gastroparesis  Gastroparesis is a condition in which food takes longer than normal to empty from the stomach. The condition is usually long-lasting (chronic). It may also be called delayed gastric emptying. There is no cure, but there are treatments and things that you can do at home to help relieve symptoms. Treating the underlying condition that causes gastroparesis can also help relieve symptoms. What are the causes? In many cases, the cause of this condition is not known. Possible causes include:  A hormone (  endocrine) disorder, such as hypothyroidism or diabetes.  A nervous system disease, such as Parkinson's disease or multiple sclerosis.  Cancer, infection, or surgery that affects the stomach or vagus nerve. The vagus nerve runs from your chest, through your neck, to the lower part of your brain.  A connective tissue disorder, such as scleroderma.  Certain medicines. What increases the risk? You are more likely to develop this condition if you:  Have certain disorders or diseases, including: ? An endocrine disorder. ? An eating disorder. ? Amyloidosis. ? Scleroderma. ? Parkinson's  disease. ? Multiple sclerosis. ? Cancer or infection of the stomach or the vagus nerve.  Have had surgery on the stomach or vagus nerve.  Take certain medicines.  Are female. What are the signs or symptoms? Symptoms of this condition include:  Feeling full after eating very little.  Nausea.  Vomiting.  Heartburn.  Abdominal bloating.  Inconsistent blood sugar (glucose) levels on blood tests.  Lack of appetite.  Weight loss.  Acid from the stomach coming up into the esophagus (gastroesophageal reflux).  Sudden tightening (spasm) of the stomach, which can be painful. Symptoms may come and go. Some people may not notice any symptoms. How is this diagnosed? This condition is diagnosed with tests, such as:  Tests that check how long it takes food to move through the stomach and intestines. These tests include: ? Upper gastrointestinal (GI) series. For this test, you drink a liquid that shows up well on X-rays, and then X-rays will be taken of your intestines. ? Gastric emptying scintigraphy. For this test, you eat food that contains a small amount of radioactive material, and then scans are taken. ? Wireless capsule GI monitoring system. For this test, you swallow a pill (capsule) that records information about how foods and fluid move through your stomach.  Gastric manometry. For this test, a tube is passed down your throat and into your stomach to measure electrical and muscular activity.  Endoscopy. For this test, a long, thin tube is passed down your throat and into your stomach to check for problems in your stomach lining.  Ultrasound. This test uses sound waves to create images of inside the body. This can help rule out gallbladder disease or pancreatitis as a cause of your symptoms. How is this treated? There is no cure for gastroparesis. Treatment may include:  Treating the underlying cause.  Managing your symptoms by making changes to your diet and exercise  habits.  Taking medicines to control nausea and vomiting and to stimulate stomach muscles.  Getting food through a feeding tube in the hospital. This may be done in severe cases.  Having surgery to insert a device into your body that helps improve stomach emptying and control nausea and vomiting (gastric neurostimulator). Follow these instructions at home:  Take over-the-counter and prescription medicines only as told by your health care provider.  Follow instructions from your health care provider about eating or drinking restrictions. Your health care provider may recommend that you: ? Eat smaller meals more often. ? Eat low-fat foods. ? Eat low-fiber forms of high-fiber foods. For example, eat cooked vegetables instead of raw vegetables. ? Have only liquid foods instead of solid foods. Liquid foods are easier to digest.  Drink enough fluid to keep your urine pale yellow.  Exercise as often as told by your health care provider.  Keep all follow-up visits as told by your health care provider. This is important. Contact a health care provider if you:  Notice  that your symptoms do not improve with treatment.  Have new symptoms. Get help right away if you:  Have severe abdominal pain that does not improve with treatment.  Have nausea that is severe or does not go away.  Cannot drink fluids without vomiting. Summary  Gastroparesis is a chronic condition in which food takes longer than normal to empty from the stomach.  Symptoms include nausea, vomiting, heartburn, abdominal bloating, and loss of appetite.  Eating smaller portions, and low-fat, low-fiber foods may help you manage your symptoms.  Get help right away if you have severe abdominal pain. This information is not intended to replace advice given to you by your health care provider. Make sure you discuss any questions you have with your health care provider. Document Released: 05/30/2005 Document Revised: 08/28/2017  Document Reviewed: 04/04/2017 Elsevier Patient Education  2020 [employer].

## 2019-04-10 NOTE — Progress Notes (Signed)
Patient ID: Penny Gonzalez, female    DOB: 1995/11/22, 23 y.o.   MRN: [id number]  PCP: Arnetha Courser, MD  Chief Complaint  Patient presents with   Urinary Tract Infection    recurrent off and on symptoms of urgency, abdominal cramps and back pain.  Has already seen gyn   SEXUALLY TRANSMITTED DISEASE    check    Subjective:   Penny Gonzalez is a 23 y.o. female, presents to clinic with CC of the following:   Pt is new to me, presents today for acute issues - urinary sx, and wants stds checked  Pt presents for months of recurrent urinary sx, urgency, flank pain, urinary frequency, states that OBGYN has seen her 2x in the past couple months and says once it was UTI and the second time no infection.  Most recent UA was 03/26/2019 dip reviewed through care everywhere -was + for blood, neg nitrites neg leukocytes (was on her period) Urinary sx always associated with mid to low central back pain, sharp, intermittent, worse when she wakes up in the morning.   Pt has constant bladder pressure and when urinary sx are worse, frequency is at least every hour, she has slightly worse urgency w/o incontinence, and no associated hematuria or dysuria.  Her back pain is not reproducible with movement or palpation, she does not think its MSK, her OB wonders if it could be kidney stones - sent to PCP to assess Patient currently denies any associated nausea, vomiting, diarrhea, fever, chills, sweats, change in appetite She denies any vaginal discharge, irritation, genital sores or lesions, denies pelvic pain.  She states that she only has her "normal vaginal discharge. She is sexually active with multiple partners in the last 6 months at least 2 female partners.  She additionally has a history of both BV and yeast vaginitis-states that she's very prone to BV, but has been well conrolled in the past 2-3 months.   Results for orders placed or performed in visit on 04/10/19  POCT urinalysis  dipstick  Result Value Ref Range   Color, UA yellow    Clarity, UA claer    Glucose, UA Negative Negative   Bilirubin, UA neg    Ketones, UA neg    Spec Grav, UA 1.020 1.010 - 1.025   Blood, UA neg    pH, UA 6.0 5.0 - 8.0   Protein, UA Negative Negative   Urobilinogen, UA 0.2 0.2 or 1.0 E.U./dL   Nitrite, UA neg    Leukocytes, UA Moderate (2+) (A) Negative   Appearance claer    Odor normal     All sx have happened this year and she never had these sx before.  Sex- currently sexually active with one female partner that she's "been with for a while" and a second female partner since July. Not using condoms.  One of the female partners does not have any other sexual partners, the other she has stopped having sex with but he probably had other partners.  Has occassional discomfort with sex only in certain sexual positions, not every time, no bleeding  Wants STD screening today  In the past with a "committed monogomous" partner - he gave her gonorrhea -so she says the people "call her freak" but she will frequently do STD screening.  She is not on any birth control and not using condoms or other barrier methods.  She states that she has normal periods, LMP 03/25/2019.  She has discussed at  length contraception options with her OB/GYN.  Currently wants to continue to remain off any birth control methods and she will continue to avoid condom use  She also asked about why she might have abdominal bloating and pain.  In reviewing her labs in her chart briefly before seeing her today noticed there was a lot of specialized GI test stool pathology C. difficile testing etc. recently and she states that she does see a gastroenterologist [facility].  Again patient is new to me I inquired about where she is getting her diabetes caregiver did not see any lab work since May 2020 with uncontrolled A1c, she states that she is getting her care here, I wanted to do that today as well.  I explained to her that  they have scheduled a 20-minute appointment for acute complaints we do not have time to properly do both well and sick complaints in the same office visit.  I offered to order the labs that she needs and then do a virtual visit as soon as she can so it is more convenient for her.  DM - not seen since May  with labs A1C was 9.2 Patient states that she is only on ryvelsus 7 mg, she stopped taking losartan and she was never on any cholesterol medication.  She does have a baby aspirin on her chart She does endorse tingling in feet, Abdominal bloating No vision changes, she states that she has done an eye exam this year.   She mentions weight loss which she was told would be a side effect of the medication she is currently on.  And per discussion above she does have increased urinary frequency and urgency    Patient Active Problem List   Diagnosis Date Noted   Recurrent major depressive disorder, in partial remission ([place]) 12/12/2018   Morbid obesity ([place]) 11/29/2017   Acanthosis nigricans 06/27/2017   Fatty liver 05/07/2016   Essential hypertension, benign 03/31/2016   Proteinuria 01/30/2016   Major depression 11/23/2015   Vitamin D deficiency 11/19/2015      Current Outpatient Medications:    Semaglutide (RYBELSUS) 7 MG TABS, Take 7 mg by mouth daily., Disp: 30 tablet, Rfl: 0   blood glucose meter kit and supplies KIT, Dispense based on patient and insurance preference. Use up to four times daily as directed. (FOR ICD-9 250.00, 250.01). (Patient not taking: Reported on 12/19/2018), Disp: 1 each, Rfl: 0   losartan (COZAAR) 25 MG tablet, Take 1 tablet (25 mg total) by mouth daily. (Patient not taking: Reported on 12/19/2018), Disp: 90 tablet, Rfl: 0   nystatin (MYCOSTATIN/NYSTOP) powder, Apply topically 2 (two) times daily as needed. (Patient not taking: Reported on 12/19/2018), Disp: 45 g, Rfl: 0   Allergies  Allergen Reactions   Latex Rash    Pruritic, urticaria     Family  History  Problem Relation Age of Onset   Diabetes Mother    Arthritis Father    Diabetes Father    Heart disease Father    Hypertension Father    Stroke Father    Diabetes Maternal Grandmother    Hypertension Maternal Grandmother    Heart disease Paternal Grandmother    Hypertension Paternal Grandmother    Lupus Sister    Deafness Sister      Social History   Socioeconomic History   Marital status: Single    Spouse name: Not on file   Number of children: 1   Years of education: Not on file   Highest  education level: High school graduate  Occupational History   Not on file  Social Needs   Financial resource strain: Not very hard   Food insecurity    Worry: Never true    Inability: Never true   Transportation needs    Medical: No    Non-medical: No  Tobacco Use   Smoking status: Never Smoker   Smokeless tobacco: Never Used  Substance and Sexual Activity   Alcohol use: Yes    Alcohol/week: 0.0 standard drinks    Comment: OCC   Drug use: No   Sexual activity: Yes    Partners: Male    Birth control/protection: Condom, Pill  Lifestyle   Physical activity    Days per week: 3 days    Minutes per session: 40 min   Stress: Rather much  Relationships   Social connections    Talks on phone: More than three times a week    Gets together: Twice a week    Attends religious service: More than 4 times per year    Active member of club or organization: No    Attends meetings of clubs or organizations: Never    Relationship status: Never married   Intimate partner violence    Fear of current or ex partner: No    Emotionally abused: No    Physically abused: No    Forced sexual activity: No  Other Topics Concern   Not on file  Social History Narrative   Not on file    I personally reviewed active problem list, medication list, allergies, family history, social history, health maintenance, notes from last several encounters, lab results  with the patient/caregiver today.  OB and GI encounters and labs in Care everywhere through [facility]  Review of Systems  Constitutional: Negative.   HENT: Negative.   Eyes: Negative.   Respiratory: Negative.   Cardiovascular: Negative.   Gastrointestinal: Negative.   Endocrine: Negative.   Genitourinary: Negative.   Musculoskeletal: Negative.   Skin: Negative.   Allergic/Immunologic: Negative.   Neurological: Negative.   Hematological: Negative.   Psychiatric/Behavioral: Negative.   All other systems reviewed and are negative.      Objective:   Vitals:   04/10/19 1138  BP: 122/84  Pulse: 89  Resp: 14  Temp: 98.1 F (36.7 C)  SpO2: 96%  Weight: 230 lb (104.3 kg)  Height: '5\' 8"'$  (1.727 m)    Body mass index is 34.97 kg/m.  Physical Exam Vitals signs and nursing note reviewed.  Constitutional:      General: She is not in acute distress.    Appearance: Normal appearance. She is well-developed. She is not ill-appearing, toxic-appearing or diaphoretic.     Interventions: Face mask in place.  HENT:     Head: Normocephalic and atraumatic.     Right Ear: External ear normal.     Left Ear: External ear normal.  Eyes:     General: Lids are normal. No scleral icterus.       Right eye: No discharge.        Left eye: No discharge.     Conjunctiva/sclera: Conjunctivae normal.  Neck:     Musculoskeletal: Normal range of motion and neck supple.     Trachea: Phonation normal. No tracheal deviation.  Cardiovascular:     Rate and Rhythm: Normal rate and regular rhythm.     Pulses: Normal pulses.          Radial pulses are 2+ on the right side and 2+  on the left side.       Posterior tibial pulses are 2+ on the right side and 2+ on the left side.     Heart sounds: Normal heart sounds. No murmur. No friction rub. No gallop.   Pulmonary:     Effort: Pulmonary effort is normal. No respiratory distress.     Breath sounds: Normal breath sounds. No stridor. No wheezing, rhonchi or  rales.  Chest:     Chest wall: No tenderness.  Abdominal:     General: Bowel sounds are normal. There is no distension.     Palpations: Abdomen is soft.     Tenderness: There is no abdominal tenderness. There is no right CVA tenderness, left CVA tenderness, guarding or rebound.  Musculoskeletal: Normal range of motion.        General: No deformity.     Thoracic back: Normal.     Lumbar back: Normal.     Right lower leg: No edema.     Left lower leg: No edema.  Lymphadenopathy:     Cervical: No cervical adenopathy.  Skin:    General: Skin is warm and dry.     Capillary Refill: Capillary refill takes less than 2 seconds.     Coloration: Skin is not jaundiced or pale.     Findings: No bruising or rash.  Neurological:     Mental Status: She is alert and oriented to person, place, and time.     Sensory: No sensory deficit.     Motor: No weakness or abnormal muscle tone.     Coordination: Coordination normal.     Gait: Gait normal.  Psychiatric:        Mood and Affect: Mood normal.        Speech: Speech normal.        Behavior: Behavior normal.      Results for orders placed or performed in visit on 04/10/19  POCT urinalysis dipstick  Result Value Ref Range   Color, UA yellow    Clarity, UA claer    Glucose, UA Negative Negative   Bilirubin, UA neg    Ketones, UA neg    Spec Grav, UA 1.020 1.010 - 1.025   Blood, UA neg    pH, UA 6.0 5.0 - 8.0   Protein, UA Negative Negative   Urobilinogen, UA 0.2 0.2 or 1.0 E.U./dL   Nitrite, UA neg    Leukocytes, UA Moderate (2+) (A) Negative   Appearance claer    Odor normal         Assessment & Plan:      ICD-10-CM   1. Urinary frequency  R35.0 Cervicovaginal ancillary only    POCT urinalysis dipstick    Urine Culture   "recurrent UTI" for 10 months, last urine culture neg, recheck UA and culture today, may be OAB, interstitial cystitis or due to uncontrolled DM?   2. Back pain, unspecified back location, unspecified back  pain laterality, unspecified chronicity  M54.9 Cervicovaginal ancillary only    POCT urinalysis dipstick   unclear etiology, exam unremarkable  3. Screening for STD (sexually transmitted disease)  Z11.3 Cervicovaginal ancillary only    POCT urinalysis dipstick    HIV antibody (with reflex)    RPR   vaginal self swab HIV and RPR, discussed safe sex practices, currently higher risk, no sx suggestive of PID, checking labs  4. High risk heterosexual behavior  Z72.51    see above, additionally discussed birth control methods, barrier methods  5.  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  She was asked to make virtual visit as she left, expressed that she didn't want to  6. Abdominal bloating  R14.0    Seeing specialist, could also be gastroparesis?  reglan trial?  7. Medication monitoring encounter  Z51.81     Urine dip was unremarkable will not initiate empiric antibiotics do suspect symptoms are more likely from uncontrolled DM or overactive bladder and do not suspect UTI at this time although she is at risk with her poorly controlled diabetes   Greater than 50% of this visit was spent in direct face-to-face counseling, obtaining history and physical, discussing and educating pt on treatment plan.  Total time of this visit was >25 min.  Remainder of time involved but was not limited to reviewing chart (recent and pertinent OV notes and labs), documentation in EMR, and coordinating care and treatment plan.     Delsa Grana, PA-C 04/10/19 11:47 AM

## 2019-04-11 ENCOUNTER — Other Ambulatory Visit: Payer: Self-pay | Admitting: Family Medicine

## 2019-04-11 DIAGNOSIS — A5903 Trichomonal cystitis and urethritis: Secondary | ICD-10-CM

## 2019-04-11 LAB — CERVICOVAGINAL ANCILLARY ONLY
Bacterial Vaginitis (gardnerella): NEGATIVE
Candida Glabrata: NEGATIVE
Candida Vaginitis: NEGATIVE
Chlamydia: NEGATIVE
Comment: NEGATIVE
Comment: NEGATIVE
Comment: NEGATIVE
Comment: NEGATIVE
Comment: NEGATIVE
Comment: NORMAL
Neisseria Gonorrhea: NEGATIVE
Trichomonas: POSITIVE — AB

## 2019-04-11 MED ORDER — METRONIDAZOLE 500 MG PO TABS: 500.0000 mg | ORAL_TABLET | Freq: Two times a day (BID) | ORAL | 0 refills | Status: DC

## 2019-04-12 ENCOUNTER — Other Ambulatory Visit: Payer: Self-pay | Admitting: Family Medicine

## 2019-04-12 LAB — HEMOGLOBIN A1C
Hgb A1c MFr Bld: 6.1 % of total Hgb — ABNORMAL HIGH (ref <5.7)
Mean Plasma Glucose: 128 (calc)
eAG (mmol/L): 7.1 (calc)

## 2019-04-12 LAB — URINE CULTURE
MICRO NUMBER:: 1041092
Result:: NO GROWTH
SPECIMEN QUALITY:: ADEQUATE

## 2019-04-12 LAB — COMPLETE METABOLIC PANEL WITH GFR
AG Ratio: 1.9 (calc) (ref 1.0–2.5)
ALT: 20 U/L (ref 6–29)
AST: 18 U/L (ref 10–30)
Albumin: 4.3 g/dL (ref 3.6–5.1)
Alkaline phosphatase (APISO): 52 U/L (ref 31–125)
BUN/Creatinine Ratio: 9 (calc) (ref 6–22)
BUN: 6 mg/dL — ABNORMAL LOW (ref 7–25)
CO2: 30 mmol/L (ref 20–32)
Calcium: 9.3 mg/dL (ref 8.6–10.2)
Chloride: 105 mmol/L (ref 98–110)
Creat: 0.69 mg/dL (ref 0.50–1.10)
GFR, Est African American: 140 mL/min/{1.73_m2} (ref >=60)
GFR, Est Non African American: 121 mL/min/{1.73_m2} (ref >=60)
Globulin: 2.3 g/dL (calc) (ref 1.9–3.7)
Glucose, Bld: 90 mg/dL (ref 65–99)
Potassium: 3.8 mmol/L (ref 3.5–5.3)
Sodium: 141 mmol/L (ref 135–146)
Total Bilirubin: 0.3 mg/dL (ref 0.2–1.2)
Total Protein: 6.6 g/dL (ref 6.1–8.1)

## 2019-04-12 LAB — HIV ANTIBODY (ROUTINE TESTING W REFLEX): HIV 1&2 Ab, 4th Generation: NONREACTIVE

## 2019-04-12 LAB — RPR: RPR Ser Ql: NONREACTIVE

## 2019-04-12 NOTE — Telephone Encounter (Signed)
Pt states she got the medication from the  Pharmacy and Roselyn Reef told her that she would be taking this for 10 days, but they only gave her 7 days worth.  Pt would like a call back about this.

## 2019-04-12 NOTE — Telephone Encounter (Signed)
Medication Refill - Medication: metroNIDAZOLE (FLAGYL) 500 MG tablet    Has the patient contacted their pharmacy? Yes.   (Agent: If no, request that the patient contact the pharmacy for the refill.) (Agent: If yes, when and what did the pharmacy advise?)  Preferred Pharmacy (with phone number or street name):  [facility] [address] [place]  [place] [address]  Phone: [phone] Fax: [phone]     Agent: Please be advised that RX refills may take up to 3 business days. We ask that you follow-up with your pharmacy.

## 2019-04-15 NOTE — Telephone Encounter (Signed)
You wrote for 10 day is this correct?

## 2019-04-15 NOTE — Telephone Encounter (Signed)
Pt calling back to check status. Please advise  °

## 2019-04-15 NOTE — Telephone Encounter (Signed)
Already taken care of pharmacy had one already from gyn and that's what they filled first.  They will give additional pills to treat.

## 2019-04-17 ENCOUNTER — Encounter: Payer: Self-pay | Admitting: Family Medicine

## 2019-04-17 ENCOUNTER — Ambulatory Visit (INDEPENDENT_AMBULATORY_CARE_PROVIDER_SITE_OTHER): Payer: 59 | Admitting: Family Medicine

## 2019-04-17 ENCOUNTER — Other Ambulatory Visit: Payer: Self-pay

## 2019-04-17 VITALS — BP 116/82 | HR 87 | Temp 97.9°F | Resp 16 | Ht 68.0 in | Wt 229.8 lb

## 2019-04-17 DIAGNOSIS — I10 Essential (primary) hypertension: Secondary | ICD-10-CM

## 2019-04-17 DIAGNOSIS — K760 Fatty (change of) liver, not elsewhere classified: Secondary | ICD-10-CM | POA: Diagnosis not present

## 2019-04-17 DIAGNOSIS — F3341 Major depressive disorder, recurrent, in partial remission: Secondary | ICD-10-CM

## 2019-04-17 DIAGNOSIS — E1169 Type 2 diabetes mellitus with other specified complication: Secondary | ICD-10-CM | POA: Diagnosis not present

## 2019-04-17 DIAGNOSIS — A5903 Trichomonal cystitis and urethritis: Secondary | ICD-10-CM

## 2019-04-17 DIAGNOSIS — E669 Obesity, unspecified: Secondary | ICD-10-CM

## 2019-04-17 MED ORDER — BLOOD GLUCOSE MONITOR KIT: PACK | 0 refills | Status: DC

## 2019-04-17 NOTE — Progress Notes (Signed)
Name: Penny Gonzalez   MRN: [id number]    DOB: Jan 21, 1996   Date:04/17/2019       Progress Note  Chief Complaint  Patient presents with  . Follow-up  . Diabetes     Subjective:   Penny Gonzalez is a 23 y.o. female, presents to clinic for routine follow up on the conditions listed above.  Diabetes Mellitus Type II: DM 3-5 years ago Currently managing with Rygelsus Pt notes good med compliance Pt has no SE from meds. Not checking CBG's  No hypoglycemic episodes Denies: Polyuria, polydipsia, polyphagia, vision changes, or neuropathy (urinary sx relative to infection STI)  Recent pertinent labs: Lab Results  Component Value Date   HGBA1C 6.1 (H) 04/10/2019   HGBA1C 9.2 (A) 10/18/2018   HGBA1C 6.8 (H) 11/29/2017      Component Value Date/Time   NA 141 04/10/2019 0000   NA 138 11/27/2014 0711   NA 139 09/20/2014 0314   K 3.8 04/10/2019 0000   K 3.8 09/20/2014 0314   CL 105 04/10/2019 0000   CL 106 09/20/2014 0314   CO2 30 04/10/2019 0000   CO2 26 09/20/2014 0314   GLUCOSE 90 04/10/2019 0000   GLUCOSE 144 (H) 09/20/2014 0314   BUN 6 (L) 04/10/2019 0000   BUN 7 11/27/2014 0711   BUN 9 09/20/2014 0314   CREATININE 0.69 04/10/2019 0000   CALCIUM 9.3 04/10/2019 0000   CALCIUM 8.6 (L) 09/20/2014 0314   PROT 6.6 04/10/2019 0000   PROT 7.0 11/27/2014 0711   PROT 6.8 07/04/2014 0021   ALBUMIN 4.0 11/20/2016 2027   ALBUMIN 4.4 11/27/2014 0711   ALBUMIN 3.0 (L) 07/04/2014 0021   AST 18 04/10/2019 0000   AST 27 07/04/2014 0021   ALT 20 04/10/2019 0000   ALT 33 07/04/2014 0021   ALKPHOS 42 11/20/2016 2027   ALKPHOS 132 (H) 07/04/2014 0021   BILITOT 0.3 04/10/2019 0000   BILITOT <0.2 11/27/2014 0711   BILITOT 0.2 07/04/2014 0021   GFRNONAA 121 04/10/2019 0000   GFRAA 140 04/10/2019 0000   Current diet: in general, a "healthy" diet   Current exercise: none - working a lot, not exercising as much as she used to  UTD on DM foot exam and eye exam ACEI/ARB:  No Statin: No  Last seen for acute issues, and before that May 2020 for DM Last visit briefly: DM - not seen since May  with labs A1C was 9.2 Patient states that she is only on ryvelsus 7 mg, she stopped taking losartan and she was never on any cholesterol medication.  She does have a baby aspirin on her chart She does endorse tingling in feet, Abdominal bloating No vision changes, she states that she has done an eye exam this year.   She mentions weight loss which she was told would be a side effect of the medication she is currently on.  And per discussion above she does have increased urinary frequency and urgency  MDD - still in remission, moods still good, no depression, denies SI Depression screen [facility] 2/9 04/17/2019 04/10/2019 12/12/2018  Decreased Interest 0 0 0  Down, Depressed, Hopeless 0 0 1  PHQ - 2 Score 0 0 1  Altered sleeping 0 0 0  Tired, decreased energy 0 0 0  Change in appetite 0 0 0  Feeling bad or failure about yourself  0 0 0  Trouble concentrating 0 0 0  Moving slowly or fidgety/restless 0 0 0  Suicidal  thoughts 0 0 0  PHQ-9 Score 0 0 1  Difficult doing work/chores Not difficult at all Not difficult at all Not difficult at all  Some recent data might be hidden   Wt Readings from Last 5 Encounters:  04/17/19 229 lb 12.8 oz (104.2 kg)  04/10/19 230 lb (104.3 kg)  11/28/18 248 lb 12.8 oz (112.9 kg)  11/14/18 245 lb 6.4 oz (111.3 kg)  10/25/18 246 lb (111.6 kg)   BMI Readings from Last 5 Encounters:  04/17/19 34.94 kg/m  04/10/19 34.97 kg/m  11/28/18 38.39 kg/m  11/14/18 37.31 kg/m  10/25/18 37.40 kg/m   Recently tested positive for trich after some recurrent UTI type sx, she has multiple partners, not using condoms.  Some sx improving but not resolved.  She is concerned with retesting.   Patient Active Problem List   Diagnosis Date Noted  . Recurrent major depressive disorder, in partial remission ([place]) 12/12/2018  . Morbid obesity ([place]) 11/29/2017   . Acanthosis nigricans 06/27/2017  . Fatty liver 05/07/2016  . Essential hypertension, benign 03/31/2016  . Proteinuria 01/30/2016  . Major depression 11/23/2015  . Vitamin D deficiency 11/19/2015    Past Surgical History:  Procedure Laterality Date  . CHOLECYSTECTOMY N/A 05/19/2016   Procedure: LAPAROSCOPIC CHOLECYSTECTOMY WITH INTRAOPERATIVE CHOLANGIOGRAM;  Surgeon: Leonie Green, MD;  Location: [facility] ORS;  Service: General;  Laterality: N/A;    Family History  Problem Relation Age of Onset  . Diabetes Mother   . Arthritis Father   . Diabetes Father   . Heart disease Father   . Hypertension Father   . Stroke Father   . Diabetes Maternal Grandmother   . Hypertension Maternal Grandmother   . Heart disease Paternal Grandmother   . Hypertension Paternal Grandmother   . Lupus Sister   . Deafness Sister     Social History   Socioeconomic History  . Marital status: Single    Spouse name: Not on file  . Number of children: 1  . Years of education: Not on file  . Highest education level: High school graduate  Occupational History  . Not on file  Social Needs  . Financial resource strain: Not very hard  . Food insecurity    Worry: Never true    Inability: Never true  . Transportation needs    Medical: No    Non-medical: No  Tobacco Use  . Smoking status: Never Smoker  . Smokeless tobacco: Never Used  Substance and Sexual Activity  . Alcohol use: Yes    Alcohol/week: 0.0 standard drinks    Comment: OCC  . Drug use: No  . Sexual activity: Yes    Partners: Male    Birth control/protection: Condom, Pill  Lifestyle  . Physical activity    Days per week: 3 days    Minutes per session: 40 min  . Stress: Rather much  Relationships  . Social connections    Talks on phone: More than three times a week    Gets together: Twice a week    Attends religious service: More than 4 times per year    Active member of club or organization: No    Attends meetings of clubs  or organizations: Never    Relationship status: Never married  . Intimate partner violence    Fear of current or ex partner: No    Emotionally abused: No    Physically abused: No    Forced sexual activity: No  Other Topics Concern  . Not  on file  Social History Narrative  . Not on file     Current Outpatient Medications:  .  metroNIDAZOLE (FLAGYL) 500 MG tablet, Take 1 tablet (500 mg total) by mouth 2 (two) times daily., Disp: 20 tablet, Rfl: 0 .  Semaglutide (RYBELSUS) 7 MG TABS, Take 7 mg by mouth daily., Disp: 30 tablet, Rfl: 0 .  blood glucose meter kit and supplies KIT, Dispense based on patient and insurance preference. Use daily in the am before breakfast.  For Dx DM type 2, E11.69, Disp: 1 each, Rfl: 0 .  metoCLOPramide (REGLAN) 5 MG tablet, Take 1-2 tablets (5-10 mg total) by mouth every 8 (eight) hours as needed for nausea (abdominal bloating). (Patient not taking: Reported on 04/17/2019), Disp: 20 tablet, Rfl: 1  Allergies  Allergen Reactions  . Latex Rash    Pruritic, urticaria    I personally reviewed active problem list, medication list, allergies, family history, social history, health maintenance, notes from last encounter, lab results, imaging with the patient/caregiver today.  Review of Systems  Constitutional: Negative.   HENT: Negative.   Eyes: Negative.   Respiratory: Negative.   Cardiovascular: Negative.   Gastrointestinal: Negative.   Endocrine: Negative.   Genitourinary: Negative.   Musculoskeletal: Negative.   Skin: Negative.   Allergic/Immunologic: Negative.   Neurological: Negative.   Hematological: Negative.   Psychiatric/Behavioral: Negative.   All other systems reviewed and are negative.    Objective:    Vitals:   04/17/19 0951  BP: 116/82  Pulse: 87  Resp: 16  Temp: 97.9 F (36.6 C)  SpO2: 98%  Weight: 229 lb 12.8 oz (104.2 kg)  Height: '5\' 8"'$  (1.727 m)    Body mass index is 34.94 kg/m.  Physical Exam Vitals signs and  nursing note reviewed.  Constitutional:      General: She is not in acute distress.    Appearance: Normal appearance. She is well-developed. She is obese. She is not ill-appearing, toxic-appearing or diaphoretic.     Interventions: Face mask in place.  HENT:     Head: Normocephalic and atraumatic.     Right Ear: External ear normal.     Left Ear: External ear normal.  Eyes:     General: Lids are normal. No scleral icterus.       Right eye: No discharge.        Left eye: No discharge.     Conjunctiva/sclera: Conjunctivae normal.  Neck:     Musculoskeletal: Normal range of motion and neck supple.     Trachea: Phonation normal. No tracheal deviation.  Cardiovascular:     Rate and Rhythm: Normal rate and regular rhythm.     Pulses: Normal pulses.          Radial pulses are 2+ on the right side and 2+ on the left side.       Posterior tibial pulses are 2+ on the right side and 2+ on the left side.     Heart sounds: Normal heart sounds. No murmur. No friction rub. No gallop.   Pulmonary:     Effort: Pulmonary effort is normal. No respiratory distress.     Breath sounds: Normal breath sounds. No stridor. No wheezing, rhonchi or rales.  Chest:     Chest wall: No tenderness.  Abdominal:     General: Bowel sounds are normal. There is no distension.     Palpations: Abdomen is soft.     Tenderness: There is no abdominal tenderness. There is no guarding  or rebound.  Musculoskeletal: Normal range of motion.        General: No deformity.     Right lower leg: No edema.     Left lower leg: No edema.  Lymphadenopathy:     Cervical: No cervical adenopathy.  Skin:    General: Skin is warm and dry.     Capillary Refill: Capillary refill takes less than 2 seconds.     Coloration: Skin is not jaundiced or pale.     Findings: No rash.  Neurological:     Mental Status: She is alert and oriented to person, place, and time.     Motor: No weakness or abnormal muscle tone.     Coordination:  Coordination normal.     Gait: Gait normal.  Psychiatric:        Mood and Affect: Mood normal.        Speech: Speech normal.        Behavior: Behavior normal.      Recent Results (from the past 2160 hour(s))  HIV antibody (with reflex)     Status: None   Collection Time: 04/10/19 12:00 AM  Result Value Ref Range   HIV 1&2 Ab, 4th Generation NON-REACTIVE NON-REACTI    Comment: HIV-1 antigen and HIV-1/HIV-2 antibodies were not detected. There is no laboratory evidence of HIV infection. [name] PLEASE NOTE: This information has been disclosed to you from records whose confidentiality may be protected by state law.  If your state requires such protection, then the state law prohibits you from making any further disclosure of the information without the specific written consent of the person to whom it pertains, or as otherwise permitted by law. A general authorization for the release of medical or other information is NOT sufficient for this purpose. . For additional information please refer to http://education.[facility].com/faq/FAQ106 (This link is being provided for informational/ educational purposes only.) . [name] The performance of this assay has not been clinically validated in patients less than 48 years old. .   RPR     Status: None   Collection Time: 04/10/19 12:00 AM  Result Value Ref Range   RPR Ser Ql NON-REACTIVE NON-REACTI  Urine Culture     Status: None   Collection Time: 04/10/19 12:00 AM   Specimen: Urine  Result Value Ref Range   MICRO NUMBER: [id number]    SPECIMEN QUALITY: Adequate    Sample Source URINE    STATUS: FINAL    Result: No Growth   COMPLETE METABOLIC PANEL WITH GFR     Status: Abnormal   Collection Time: 04/10/19 12:00 AM  Result Value Ref Range   Glucose, Bld 90 65 - 99 mg/dL    Comment: .            Fasting reference interval .    BUN 6 (L) 7 - 25 mg/dL   Creat 0.69 0.50 - 1.10 mg/dL   GFR, Est Non African American 121 > OR = 60  mL/min/1.12m   GFR, Est African American 140 > OR = 60 mL/min/1.752m  BUN/Creatinine Ratio 9 6 - 22 (calc)   Sodium 141 135 - 146 mmol/L   Potassium 3.8 3.5 - 5.3 mmol/L   Chloride 105 98 - 110 mmol/L   CO2 30 20 - 32 mmol/L   Calcium 9.3 8.6 - 10.2 mg/dL   Total Protein 6.6 6.1 - 8.1 g/dL   Albumin 4.3 3.6 - 5.1 g/dL   Globulin 2.3 1.9 - 3.7 g/dL (calc)  AG Ratio 1.9 1.0 - 2.5 (calc)   Total Bilirubin 0.3 0.2 - 1.2 mg/dL   Alkaline phosphatase (APISO) 52 31 - 125 U/L   AST 18 10 - 30 U/L   ALT 20 6 - 29 U/L  Hemoglobin A1c     Status: Abnormal   Collection Time: 04/10/19 12:00 AM  Result Value Ref Range   Hgb A1c MFr Bld 6.1 (H) <5.7 % of total Hgb    Comment: For someone without known diabetes, a hemoglobin  A1c value between 5.7% and 6.4% is consistent with prediabetes and should be confirmed with a  follow-up test. . For someone with known diabetes, a value <7% indicates that their diabetes is well controlled. A1c targets should be individualized based on duration of diabetes, age, comorbid conditions, and other considerations. . This assay result is consistent with an increased risk of diabetes. . Currently, no consensus exists regarding use of hemoglobin A1c for diagnosis of diabetes for children. .    Mean Plasma Glucose 128 (calc)   eAG (mmol/L) 7.1 (calc)  Cervicovaginal ancillary only     Status: Abnormal   Collection Time: 04/10/19 11:32 AM  Result Value Ref Range   Neisseria Gonorrhea Negative    Chlamydia Negative    Trichomonas Positive (A)    Bacterial Vaginitis (gardnerella) Negative    Candida Vaginitis Negative    Candida Glabrata Negative    Comment      Normal Reference Range Bacterial Vaginosis - Negative   Comment Normal Reference Range Candida Species - Negative    Comment Normal Reference Range Candida Galbrata - Negative    Comment Normal Reference Range Trichomonas - Negative    Comment Normal Reference Ranger Chlamydia - Negative     Comment      Normal Reference Range Neisseria Gonorrhea - Negative  POCT urinalysis dipstick     Status: Abnormal   Collection Time: 04/10/19 11:45 AM  Result Value Ref Range   Color, UA yellow    Clarity, UA claer    Glucose, UA Negative Negative   Bilirubin, UA neg    Ketones, UA neg    Spec Grav, UA 1.020 1.010 - 1.025   Blood, UA neg    pH, UA 6.0 5.0 - 8.0   Protein, UA Negative Negative   Urobilinogen, UA 0.2 0.2 or 1.0 E.U./dL   Nitrite, UA neg    Leukocytes, UA Moderate (2+) (A) Negative   Appearance claer    Odor normal     Diabetic Foot Exam: Diabetic Foot Exam - Simple   Simple Foot Form Diabetic Foot exam was performed with the following findings: Yes 04/17/2019 10:00 AM  Visual Inspection Sensation Testing Pulse Check Comments      PHQ2/9: Depression screen [facility] 2/9 04/17/2019 04/10/2019 12/12/2018 11/28/2018 11/14/2018  Decreased Interest 0 0 0 0 0  Down, Depressed, Hopeless 0 0 1 0 0  PHQ - 2 Score 0 0 1 0 0  Altered sleeping 0 0 0 0 0  Tired, decreased energy 0 0 0 0 0  Change in appetite 0 0 0 0 0  Feeling bad or failure about yourself  0 0 0 0 0  Trouble concentrating 0 0 0 0 0  Moving slowly or fidgety/restless 0 0 0 0 0  Suicidal thoughts 0 0 0 0 0  PHQ-9 Score 0 0 1 0 0  Difficult doing work/chores Not difficult at all Not difficult at all Not difficult at all Not difficult at all Not  difficult at all  Some recent data might be hidden    phq 9 is negative Reviewed today  Fall Risk: Fall Risk  04/17/2019 04/10/2019 12/12/2018 11/28/2018 11/14/2018  Falls in the past year? 0 0 0 0 0  Number falls in past yr: 0 0 0 0 -  Injury with Fall? 0 0 0 0 -    Functional Status Survey: Is the patient deaf or have difficulty hearing?: No Does the patient have difficulty seeing, even when wearing glasses/contacts?: No Does the patient have difficulty concentrating, remembering, or making decisions?: No Does the patient have difficulty walking or climbing  stairs?: No Does the patient have difficulty dressing or bathing?: No Does the patient have difficulty doing errands alone such as visiting a doctor's office or shopping?: No    Assessment & Plan:     ICD-10-CM   1. Diabetes mellitus type 2 in obese ([facility]CC)  E11.69 blood glucose meter kit and supplies KIT   E66.9    now controlled, some weight loss, foot exam done, requesting records for eye exam, pt refuses ARB and statin  2. Recurrent major depressive disorder, in partial remission ([facility])  F33.41    still in remission off meds, feels good  3. Morbid obesity ([facility])  E66.01    weight gain and then loss this past year, discussed diet, exercise and gradual weight loss for health  4. Fatty liver  K76.0   5. Essential hypertension, benign  I10    controlled with lifestyle/diet, recommended low dose losartan  6. Trichomonal cystitis and urethritis  A59.03 Cytology (oral, anal, urethral) ancillary only   taking flagyl now, compiant with meds, retest 2-3 weeks after completion of abx per CDC recommendations      Return in about 6 months (around 10/15/2019) for Routine follow-up.   Delsa Grana, PA-C 04/17/19 11:53 AM

## 2019-04-17 NOTE — Patient Instructions (Signed)
Come back for retest around 3 weeks after your medications are finished.  Around end of Nov, beginning of Dec  No office visit needed

## 2019-04-22 ENCOUNTER — Other Ambulatory Visit: Payer: Self-pay

## 2019-04-22 ENCOUNTER — Ambulatory Visit: Payer: 59 | Admitting: Family Medicine

## 2019-04-24 ENCOUNTER — Encounter: Payer: Self-pay | Admitting: Family Medicine

## 2019-04-29 ENCOUNTER — Encounter: Payer: Self-pay | Admitting: Family Medicine

## 2019-05-01 ENCOUNTER — Other Ambulatory Visit (HOSPITAL_COMMUNITY)
Admission: RE | Admit: 2019-05-01 | Discharge: 2019-05-01 | Disposition: A | Discharge status: Home / Self Care | Payer: 59 | Source: Ambulatory Visit | Attending: Family Medicine | Admitting: Family Medicine

## 2019-05-01 ENCOUNTER — Other Ambulatory Visit: Payer: Self-pay

## 2019-05-01 DIAGNOSIS — A5903 Trichomonal cystitis and urethritis: Secondary | ICD-10-CM | POA: Insufficient documentation

## 2019-05-02 LAB — CYTOLOGY, (ORAL, ANAL, URETHRAL) ANCILLARY ONLY
Chlamydia: NEGATIVE
Comment: NEGATIVE
Comment: NEGATIVE
Comment: NORMAL
Neisseria Gonorrhea: NEGATIVE
Trichomonas: NEGATIVE

## 2019-05-03 ENCOUNTER — Encounter: Payer: Self-pay | Admitting: Family Medicine

## 2019-05-08 ENCOUNTER — Other Ambulatory Visit: Payer: Self-pay | Admitting: Family Medicine

## 2019-05-08 DIAGNOSIS — E11628 Type 2 diabetes mellitus with other skin complications: Secondary | ICD-10-CM

## 2019-05-08 DIAGNOSIS — IMO0002 Reserved for concepts with insufficient information to code with codable children: Secondary | ICD-10-CM

## 2019-05-13 ENCOUNTER — Encounter: Payer: Self-pay | Admitting: Family Medicine

## 2019-10-15 ENCOUNTER — Ambulatory Visit: Payer: 59 | Admitting: Family Medicine

## 2020-01-15 ENCOUNTER — Encounter: Payer: Self-pay | Admitting: Emergency Medicine

## 2020-01-15 ENCOUNTER — Other Ambulatory Visit: Payer: Self-pay

## 2020-01-15 ENCOUNTER — Emergency Department
Admission: EM | Admit: 2020-01-15 | Discharge: 2020-01-15 | Disposition: A | Discharge status: Home / Self Care | Payer: 59 | Attending: Emergency Medicine | Admitting: Emergency Medicine

## 2020-01-15 DIAGNOSIS — E119 Type 2 diabetes mellitus without complications: Secondary | ICD-10-CM | POA: Insufficient documentation

## 2020-01-15 DIAGNOSIS — M545 Low back pain: Secondary | ICD-10-CM | POA: Diagnosis not present

## 2020-01-15 DIAGNOSIS — Y939 Activity, unspecified: Secondary | ICD-10-CM | POA: Insufficient documentation

## 2020-01-15 DIAGNOSIS — Y999 Unspecified external cause status: Secondary | ICD-10-CM | POA: Diagnosis not present

## 2020-01-15 DIAGNOSIS — Y92410 Unspecified street and highway as the place of occurrence of the external cause: Secondary | ICD-10-CM | POA: Diagnosis not present

## 2020-01-15 DIAGNOSIS — I10 Essential (primary) hypertension: Secondary | ICD-10-CM | POA: Diagnosis not present

## 2020-01-15 DIAGNOSIS — Z91040 Latex allergy status: Secondary | ICD-10-CM | POA: Diagnosis not present

## 2020-01-15 MED ORDER — IBUPROFEN 600 MG PO TABS: 600.0000 mg | ORAL_TABLET | Freq: Four times a day (QID) | ORAL | 0 refills | Status: DC | PRN

## 2020-01-15 MED ORDER — METHOCARBAMOL 500 MG PO TABS: 500.0000 mg | ORAL_TABLET | Freq: Three times a day (TID) | ORAL | 0 refills | Status: DC
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
## 2020-05-30 ENCOUNTER — Encounter: Payer: Self-pay | Admitting: Emergency Medicine

## 2020-05-30 ENCOUNTER — Other Ambulatory Visit: Payer: Self-pay

## 2020-05-30 ENCOUNTER — Emergency Department
Admission: EM | Admit: 2020-05-30 | Discharge: 2020-05-30 | Disposition: A | Discharge status: Home / Self Care | Payer: 59 | Attending: Emergency Medicine | Admitting: Emergency Medicine

## 2020-05-30 DIAGNOSIS — Z7984 Long term (current) use of oral hypoglycemic drugs: Secondary | ICD-10-CM | POA: Diagnosis not present

## 2020-05-30 DIAGNOSIS — U071 COVID-19: Secondary | ICD-10-CM | POA: Diagnosis not present

## 2020-05-30 DIAGNOSIS — E119 Type 2 diabetes mellitus without complications: Secondary | ICD-10-CM | POA: Insufficient documentation

## 2020-05-30 DIAGNOSIS — I10 Essential (primary) hypertension: Secondary | ICD-10-CM | POA: Diagnosis not present

## 2020-05-30 DIAGNOSIS — R509 Fever, unspecified: Secondary | ICD-10-CM | POA: Diagnosis present

## 2020-05-30 DIAGNOSIS — Z91040 Latex allergy status: Secondary | ICD-10-CM | POA: Diagnosis not present

## 2020-05-30 LAB — RESP PANEL BY RT-PCR (FLU A&B, COVID) ARPGX2
Influenza A by PCR: NEGATIVE
Influenza B by PCR: NEGATIVE
SARS Coronavirus 2 by RT PCR: POSITIVE — AB

## 2020-05-30 LAB — CBG MONITORING, ED: Glucose-Capillary: 108 mg/dL — ABNORMAL HIGH (ref 70–99)

## 2020-05-30 MED ORDER — ACETAMINOPHEN 325 MG PO TABS
650.0000 mg | ORAL_TABLET | Freq: Once | ORAL | Status: AC
  Administered 2020-05-30: 650 mg via ORAL
  Filled 2020-05-30: qty 2

## 2020-05-30 NOTE — ED Provider Notes (Signed)
[facility] Emergency Department Provider Note   ____________________________________________   Event Date/Time   First MD Initiated Contact with Patient 05/30/20 1816     (approximate)  I have reviewed the triage vital signs and the nursing notes.   HISTORY  Chief Complaint Fever, Headache, and Chills    HPI Penny Gonzalez is a 24 y.o. female patient presents with fever, headache, and chills starting today.  Patient recently returned from [place] 4 days ago.  Patient states she has not taken COVID-19 vaccine or flu shot.  Patient denies nausea, vomiting, or diarrhea.         Past Medical History:  Diagnosis Date  . Anemia, iron deficiency, inadequate dietary intake 12/01/2014  . Depression    patient has not been officially diagnosed but has symptoms  . Diabetes mellitus without complication ([place])   . Essential hypertension, benign 03/31/2016  . Irregular periods/menstrual cycles   . Joint pain of leg   . Major depression 11/23/2015    Patient Active Problem List   Diagnosis Date Noted  . Recurrent major depressive disorder, in partial remission ([place]) 12/12/2018  . Morbid obesity ([place]) 11/29/2017  . Acanthosis nigricans 06/27/2017  . Fatty liver 05/07/2016  . Essential hypertension, benign 03/31/2016  . Proteinuria 01/30/2016  . Major depression 11/23/2015  . Vitamin D deficiency 11/19/2015    Past Surgical History:  Procedure Laterality Date  . CHOLECYSTECTOMY N/A 05/19/2016   Procedure: LAPAROSCOPIC CHOLECYSTECTOMY WITH INTRAOPERATIVE CHOLANGIOGRAM;  Surgeon: Leonie Green, MD;  Location: [facility] ORS;  Service: General;  Laterality: N/A;    Prior to Admission medications   Medication Sig Start Date End Date Taking? Authorizing Provider  blood glucose meter kit and supplies KIT Dispense based on patient and insurance preference. Use daily in the am before breakfast.  For Dx DM type 2, E11.69 04/17/19   Delsa Grana, PA-C   ibuprofen (ADVIL) 600 MG tablet Take 1 tablet (600 mg total) by mouth every 6 (six) hours as needed. 01/15/20   Laban Emperor, PA-C  methocarbamol (ROBAXIN) 500 MG tablet Take 1 tablet (500 mg total) by mouth 3 (three) times daily. 01/15/20   Laban Emperor, PA-C  RYBELSUS 7 MG TABS Take 1 tablet by mouth once daily 05/08/19   Hubbard Hartshorn, FNP    Allergies Latex  Family History  Problem Relation Age of Onset  . Diabetes Mother   . Arthritis Father   . Diabetes Father   . Heart disease Father   . Hypertension Father   . Stroke Father   . Diabetes Maternal Grandmother   . Hypertension Maternal Grandmother   . Heart disease Paternal Grandmother   . Hypertension Paternal Grandmother   . Lupus Sister   . Deafness Sister     Social History Social History   Tobacco Use  . Smoking status: Never Smoker  . Smokeless tobacco: Never Used  Vaping Use  . Vaping Use: Never used  Substance Use Topics  . Alcohol use: Yes    Alcohol/week: 0.0 standard drinks    Comment: OCC  . Drug use: No    Review of Systems Constitutional: Chills.   Eyes: No visual changes. ENT: No sore throat. Cardiovascular: Denies chest pain. Respiratory: Denies shortness of breath. Gastrointestinal: No abdominal pain.  No nausea, no vomiting.  No diarrhea.  No constipation. Genitourinary: Negative for dysuria. Musculoskeletal: Negative for back pain. Skin: Negative for rash. Neurological: Negative for headaches, but denies focal weakness or numbness. Psychiatric:  Depression  Endocrine:  Diabetes and hypertension Allergic/Immunilogical: Latex ____________________________________________   PHYSICAL EXAM:  VITAL SIGNS: ED Triage Vitals  Enc Vitals Group     BP --      Pulse --      Resp --      Temp --      Temp src --      SpO2 --      Weight 05/30/20 1836 235 lb (106.6 kg)     Height 05/30/20 1836 $RemoveBefor'5\' 9"'[id number]$  (1.753 m)     Head Circumference --      Peak Flow --      Pain Score 05/30/20 1751 8      Pain Loc --      Pain Edu? --      Excl. in [place]? --    Constitutional: Alert and oriented. Well appearing and in no acute distress. Eyes: Conjunctivae are normal. PERRL. EOMI. Head: Atraumatic. Nose: No congestion/rhinnorhea. Mouth/Throat: Mucous membranes are moist.  Oropharynx non-erythematous. Neck: No stridor.  Hematological/Lymphatic/Immunilogical: No cervical lymphadenopathy. Cardiovascular: Normal rate, regular rhythm. Grossly normal heart sounds.  Good peripheral circulation. Respiratory: Normal respiratory effort.  No retractions. Lungs CTAB. Gastrointestinal: Soft and nontender. No distention. No abdominal bruits. No CVA tenderness. Genitourinary: Deferred Neurologic:  Normal speech and language. No gross focal neurologic deficits are appreciated.  Skin:  Skin is warm, dry and intact. No rash noted. Psychiatric: Mood and affect are normal. Speech and behavior are normal.  ____________________________________________   LABS (all labs ordered are listed, but only abnormal results are displayed)  Labs Reviewed  RESP PANEL BY RT-PCR (FLU A&B, COVID) ARPGX2 - Abnormal; Notable for the following components:      Result Value   SARS Coronavirus 2 by RT PCR POSITIVE (*)    All other components within normal limits  CBG MONITORING, ED - Abnormal; Notable for the following components:   Glucose-Capillary 108 (*)    All other components within normal limits   ____________________________________________  EKG   ____________________________________________  RADIOLOGY I, Sable Feil, personally viewed and evaluated these images (plain radiographs) as part of my medical decision making, as well as reviewing the written report by the radiologist.  ED MD interpretation:    Official radiology report(s): No results found.  ____________________________________________   PROCEDURES  Procedure(s) performed (including Critical  Care):  Procedures   ____________________________________________   INITIAL IMPRESSION / ASSESSMENT AND PLAN / ED COURSE  As part of my medical decision making, I reviewed the following data within the [place]         Patient presents with fever, headache, chills started today.  Patient returned from [place] 4 days ago.  Discussed lab results with patient testing positive for COVID-19.  Patient advised self quarantine and follow discharge care instructions.  Return to ED if condition worsens.     ____________________________________________   FINAL CLINICAL IMPRESSION(S) / ED DIAGNOSES  Final diagnoses:  [id number]     ED Discharge Orders    None      *Please note:  Penny Gonzalez was evaluated in Emergency Department on 05/30/2020 for the symptoms described in the history of present illness. She was evaluated in the context of the global COVID-19 pandemic, which necessitated consideration that the patient might be at risk for infection with the SARS-CoV-2 virus that causes COVID-19. Institutional protocols and algorithms that pertain to the evaluation of patients at risk for COVID-19 are in a state of rapid change based on information released by  regulatory bodies including the CDC and federal and state organizations. These policies and algorithms were followed during the patient's care in the ED.  Some ED evaluations and interventions may be delayed as a result of limited staffing during and the pandemic.*   Note:  This document was prepared using Dragon voice recognition software and may include unintentional dictation errors.    Sable Feil, PA-C 05/30/20 2049    Harvest Dark, MD 05/30/20 2144

## 2020-05-30 NOTE — ED Triage Notes (Signed)
Pt reports yesterday started with a fever, headache and chills.

## 2020-05-30 NOTE — ED Notes (Signed)
Date and time results received: 05/30/20 *8:28 PM    Test: Covid Critical Value: Positive  Name of Provider Notified: Nona Dell PA

## 2020-05-30 NOTE — Discharge Instructions (Signed)
Follow discharge care instruction self quarantine as directed.  Advise over-the-counter Tylenol ibuprofen for body aches and fever.  Return back to ED if condition worsens.

## 2020-05-31 ENCOUNTER — Telehealth: Payer: Self-pay | Admitting: Oncology

## 2020-05-31 NOTE — Telephone Encounter (Signed)
Called to Discuss with patient about Covid symptoms and the use of the monoclonal antibody infusion for those with mild to moderate Covid symptoms and at a high risk of hospitalization.     Pt is qualified for this infusion due to co-morbid conditions and/or a member of an at-risk group.     Patient Active Problem List   Diagnosis Date Noted  . Recurrent major depressive disorder, in partial remission ([facility]) 12/12/2018  . Morbid obesity ([facility]) 11/29/2017  . Acanthosis nigricans 06/27/2017  . Fatty liver 05/07/2016  . Essential hypertension, benign 03/31/2016  . Proteinuria 01/30/2016  . Major depression 11/23/2015  . Vitamin D deficiency 11/19/2015    Patient declines infusion at this time. Symptoms tier reviewed as well as criteria for ending isolation. Preventative practices reviewed. Patient verbalized understanding.   She would like to think about the infusion and get back to [place].    Patient advised to call back if he/she decides that he/she does want to get infusion. Callback number to the infusion center given. Patient advised to go to Urgent care or ED with severe symptoms.    Durenda Hurt, NP 05/31/2020 2:41 PM

## 2021-08-04 ENCOUNTER — Encounter: Payer: 59 | Admitting: Obstetrics and Gynecology

## 2022-04-02 ENCOUNTER — Other Ambulatory Visit: Payer: Self-pay

## 2022-04-02 ENCOUNTER — Emergency Department
Admission: EM | Admit: 2022-04-02 | Discharge: 2022-04-03 | Disposition: A | Discharge status: Home / Self Care | Payer: BC Managed Care – PPO | Attending: Emergency Medicine | Admitting: Emergency Medicine

## 2022-04-02 ENCOUNTER — Emergency Department: Payer: BC Managed Care – PPO

## 2022-04-02 DIAGNOSIS — R0781 Pleurodynia: Secondary | ICD-10-CM | POA: Diagnosis not present

## 2022-04-02 DIAGNOSIS — R0600 Dyspnea, unspecified: Secondary | ICD-10-CM | POA: Diagnosis not present

## 2022-04-02 DIAGNOSIS — Z3A09 9 weeks gestation of pregnancy: Secondary | ICD-10-CM | POA: Diagnosis not present

## 2022-04-02 DIAGNOSIS — O26891 Other specified pregnancy related conditions, first trimester: Secondary | ICD-10-CM | POA: Insufficient documentation

## 2022-04-02 DIAGNOSIS — R0789 Other chest pain: Secondary | ICD-10-CM

## 2022-04-02 LAB — CBC
HCT: 33.2 % — ABNORMAL LOW (ref 36.0–46.0)
Hemoglobin: 10.8 g/dL — ABNORMAL LOW (ref 12.0–15.0)
MCH: 27.6 pg (ref 26.0–34.0)
MCHC: 32.5 g/dL (ref 30.0–36.0)
MCV: 84.7 fL (ref 80.0–100.0)
Platelets: 255 10*3/uL (ref 150–400)
RBC: 3.92 MIL/uL (ref 3.87–5.11)
RDW: 14.6 % (ref 11.5–15.5)
WBC: 8 10*3/uL (ref 4.0–10.5)
nRBC: 0 % (ref 0.0–0.2)

## 2022-04-02 LAB — BASIC METABOLIC PANEL
Anion gap: 6 (ref 5–15)
BUN: 7 mg/dL (ref 6–20)
CO2: 25 mmol/L (ref 22–32)
Calcium: 8.7 mg/dL — ABNORMAL LOW (ref 8.9–10.3)
Chloride: 104 mmol/L (ref 98–111)
Creatinine, Ser: 0.65 mg/dL (ref 0.44–1.00)
GFR, Estimated: >60 mL/min (ref >60)
Glucose, Bld: 133 mg/dL — ABNORMAL HIGH (ref 70–99)
Potassium: 3.9 mmol/L (ref 3.5–5.1)
Sodium: 135 mmol/L (ref 135–145)

## 2022-04-02 LAB — TROPONIN I (HIGH SENSITIVITY): Troponin I (High Sensitivity): 3 ng/L (ref <18)

## 2022-04-02 NOTE — ED Triage Notes (Signed)
Pt arrives with c/o CP and SOB that started about a week ago. Pt endorses nausea. Pt is currently [redacted] weeks pregnant.

## 2022-04-02 NOTE — ED Provider Notes (Incomplete)
   [facility] Provider Note    Event Date/Time   First MD Initiated Contact with Patient 04/02/22 2352     (approximate)   History   Chest Pain   HPI  Penny Gonzalez is a 26 y.o. female who presents to the ED for evaluation of Chest Pain   I review an obstetric clinic visit from 10/10.  G4 P1-1-2-1, LMP 8/19.   Physical Exam   Triage Vital Signs: ED Triage Vitals  Enc Vitals Group     BP 04/02/22 2226 114/67     Pulse Rate 04/02/22 2226 88     Resp 04/02/22 2226 18     Temp 04/02/22 2226 98 F (36.7 C)     Temp Source 04/02/22 2226 Oral     SpO2 04/02/22 2226 100 %     Weight 04/02/22 2122 221 lb (100.2 kg)     Height --      Head Circumference --      Peak Flow --      Pain Score 04/02/22 2122 8     Pain Loc --      Pain Edu? --      Excl. in [place]? --     Most recent vital signs: Vitals:   04/02/22 2226  BP: 114/67  Pulse: 88  Resp: 18  Temp: 98 F (36.7 C)  SpO2: 100%    General: Awake, no distress. *** CV:  Good peripheral perfusion.  Resp:  Normal effort.  Abd:  No distention.  MSK:  No deformity noted.  Neuro:  No focal deficits appreciated. Other:     ED Results / Procedures / Treatments   Labs (all labs ordered are listed, but only abnormal results are displayed) Labs Reviewed  BASIC METABOLIC PANEL - Abnormal; Notable for the following components:      Result Value   Glucose, Bld 133 (*)    Calcium 8.7 (*)    All other components within normal limits  CBC - Abnormal; Notable for the following components:   Hemoglobin 10.8 (*)    HCT 33.2 (*)    All other components within normal limits  TROPONIN I (HIGH SENSITIVITY)  TROPONIN I (HIGH SENSITIVITY)    EKG ***  RADIOLOGY ***  Official radiology report(s): DG Chest 2 View  Result Date: 04/02/2022 CLINICAL DATA:  Chest pain and shortness of breath starting a week ago. Nausea. Nine weeks pregnant. EXAM: CHEST - 2 VIEW COMPARISON:  05/16/2016  FINDINGS: The heart size and mediastinal contours are within normal limits. Both lungs are clear. The visualized skeletal structures are unremarkable. IMPRESSION: No active cardiopulmonary disease. Electronically Signed   By: Lucienne Capers M.D.   On: 04/02/2022 22:09    PROCEDURES and INTERVENTIONS:  Procedures  Medications - No data to display   IMPRESSION / MDM / [place] / ED COURSE  I reviewed the triage vital signs and the nursing notes.  Differential diagnosis includes, but is not limited to, ***  {Patient presents with symptoms of an acute illness or injury that is potentially life-threatening.}      FINAL CLINICAL IMPRESSION(S) / ED DIAGNOSES   Final diagnoses:  None     Rx / DC Orders   ED Discharge Orders     None        Note:  This document was prepared using Dragon voice recognition software and may include unintentional dictation errors.

## 2022-04-02 NOTE — ED Provider Notes (Signed)
[facility] Provider Note    Event Date/Time   First MD Initiated Contact with Patient 04/02/22 2352     (approximate)   History   Chest Pain   HPI  Penny Gonzalez is a 26 y.o. female who presents to the ED for evaluation of Chest Pain   I review an obstetric clinic visit from 10/10.  G4 P1-1-2-1, LMP 8/19.   Patient presents to the ED for evaluation of 1 week of intermittent left-sided chest discomfort as well as dyspnea with pleuritic pain.  She reports symptoms are often worse at nighttime when she is trying to sleep.  Sometimes feels it during the day.  No clear exertional symptoms or precipitating features.  She reports sharp left-sided pain that lasts minutes-hours in time but then self resolves.  Sometimes feeling like she is dyspneic alongside this, largely due to the pleuritic nature of the pain.  No cough, fever, emesis or syncope.  Does report some nausea that she attributes to her first trimester gestation.  Physical Exam   Triage Vital Signs: ED Triage Vitals  Enc Vitals Group     BP 04/02/22 2226 114/67     Pulse Rate 04/02/22 2226 88     Resp 04/02/22 2226 18     Temp 04/02/22 2226 98 F (36.7 C)     Temp Source 04/02/22 2226 Oral     SpO2 04/02/22 2226 100 %     Weight 04/02/22 2122 221 lb (100.2 kg)     Height --      Head Circumference --      Peak Flow --      Pain Score 04/02/22 2122 8     Pain Loc --      Pain Edu? --      Excl. in GC? --     Most recent vital signs: Vitals:   04/03/22 0300 04/03/22 0317  BP: 99/61   Pulse: 81   Resp: 14   Temp:  98 F (36.7 C)  SpO2: 100%     General: Awake, no distress.  Looks well, pleasant and conversational. CV:  Good peripheral perfusion.  RRR without appreciable murmur Resp:  Normal effort.  CTA B Abd:  No distention.  MSK:  No deformity noted.  Reproducible pain on palpation is noted.  No overlying skin changes or signs of trauma. Neuro:  No focal deficits  appreciated. Other:     ED Results / Procedures / Treatments   Labs (all labs ordered are listed, but only abnormal results are displayed) Labs Reviewed  BASIC METABOLIC PANEL - Abnormal; Notable for the following components:      Result Value   Glucose, Bld 133 (*)    Calcium 8.7 (*)    All other components within normal limits  CBC - Abnormal; Notable for the following components:   Hemoglobin 10.8 (*)    HCT 33.2 (*)    All other components within normal limits  D-DIMER, QUANTITATIVE  TROPONIN I (HIGH SENSITIVITY)  TROPONIN I (HIGH SENSITIVITY)    EKG Sinus rhythm with a rate of 85 bpm.  Normal axis and intervals.  No clear signs of acute ischemia.  T wave inversions isolated to lead III. Similar morphology for   RADIOLOGY CXR interpreted by me without evidence of acute cardiopulmonary pathology.  Official radiology report(s): DG Chest 2 View  Result Date: 04/02/2022 CLINICAL DATA:  Chest pain and shortness of breath starting a week ago. Nausea. Nine weeks pregnant.  EXAM: CHEST - 2 VIEW COMPARISON:  05/16/2016 FINDINGS: The heart size and mediastinal contours are within normal limits. Both lungs are clear. The visualized skeletal structures are unremarkable. IMPRESSION: No active cardiopulmonary disease. Electronically Signed   By: Lucienne Capers M.D.   On: 04/02/2022 22:09    PROCEDURES and INTERVENTIONS:  .1-3 Lead EKG Interpretation  Performed by: Vladimir Crofts, MD Authorized by: Vladimir Crofts, MD     Interpretation: normal     ECG rate:  80   ECG rate assessment: normal     Rhythm: sinus rhythm     Ectopy: none     Conduction: normal     Medications  lidocaine (LIDODERM) 5 % 1 patch (1 patch Transdermal Patch Applied 04/03/22 0046)  acetaminophen (TYLENOL) tablet 1,000 mg (1,000 mg Oral Given 04/03/22 0046)  dextrose 5% lactated ringers bolus 1,000 mL (0 mLs Intravenous Stopped 04/03/22 0316)     IMPRESSION / MDM / ASSESSMENT AND PLAN / ED COURSE  I  reviewed the triage vital signs and the nursing notes.  Differential diagnosis includes, but is not limited to, ACS, PTX, PNA, muscle strain/spasm, PE, dissection  {Patient presents with symptoms of an acute illness or injury that is potentially life-threatening.  26 year old woman at about [redacted] weeks gestation presents with atypical chest pain, possibly MSK in etiology and suitable for outpatient management.  Looks well.  Normal vitals.  No hypoxia or tachycardia noted.  EKG is nonischemic.  Unable to [facility] the patient out because [facility] was not validated for pregnancy.  Therefore sent a D-dimer and this was negative.  2 negative troponins.  ACS and PE are quite unlikely.  Clear CXR, normal metabolic panel and CBC.  Pain resolved with Tylenol and lidocaine patch.  We will discharge with the same.  Discussed return precautions and following up with OB.  Clinical Course as of 04/03/22 0449  Sun Apr 03, 2022  0028 While of a low suspicion for PE or more severe cardiopulmonary pathology, I am technically unable to rule out this patient with [facility] criteria as it was never validated for pregnant patients.  I believe a D-dimer is reasonable. [DS]  [id number]. Feeling better [DS]    Clinical Course User Index [DS] Vladimir Crofts, MD     FINAL CLINICAL IMPRESSION(S) / ED DIAGNOSES   Final diagnoses:  Other chest pain  [redacted] weeks gestation of pregnancy     Rx / DC Orders   ED Discharge Orders          Ordered    lidocaine (LIDODERM) 5 %  Every 12 hours        04/03/22 0217             Note:  This document was prepared using Dragon voice recognition software and may include unintentional dictation errors.   Vladimir Crofts, MD 04/03/22 [phone]

## 2022-04-03 DIAGNOSIS — O26891 Other specified pregnancy related conditions, first trimester: Secondary | ICD-10-CM | POA: Diagnosis not present

## 2022-04-03 LAB — D-DIMER, QUANTITATIVE: D-Dimer, Quant: <0.27 ug/mL-FEU (ref 0.00–0.50)

## 2022-04-03 LAB — TROPONIN I (HIGH SENSITIVITY): Troponin I (High Sensitivity): 4 ng/L (ref <18)

## 2022-04-03 MED ORDER — LIDOCAINE 5 % EX PTCH
1.0000 | MEDICATED_PATCH | CUTANEOUS | Status: DC
  Administered 2022-04-03: 1 via TRANSDERMAL
  Filled 2022-04-03: qty 1

## 2022-04-03 MED ORDER — ACETAMINOPHEN 500 MG PO TABS
1000.0000 mg | ORAL_TABLET | Freq: Once | ORAL | Status: AC
  Administered 2022-04-03: 1000 mg via ORAL
  Filled 2022-04-03: qty 2

## 2022-04-03 MED ORDER — DEXTROSE 5 % IN LACTATED RINGERS IV BOLUS
1000.0000 mL | Freq: Once | INTRAVENOUS | Status: AC
  Administered 2022-04-03: 1000 mL via INTRAVENOUS
  Filled 2022-04-03: qty 1000

## 2022-04-03 MED ORDER — LIDOCAINE 5 % EX PTCH: 1.0000 | MEDICATED_PATCH | Freq: Two times a day (BID) | CUTANEOUS | 0 refills | Status: DC

## 2022-04-03 NOTE — ED Notes (Signed)
Patient discharged at this time. Ambulated to lobby with independent and steady gait. Breathing unlabored speaking in full sentences. Verbalized understanding of all discharge, follow up, and medication teaching. Discharged homed with all belongings.   

## 2022-04-03 NOTE — Discharge Instructions (Signed)
Use Tylenol for pain and fevers.  Up to 1000 mg per dose, up to 4 times per day.  Do not take more than 4000 mg of Tylenol/acetaminophen within 24 hours.. ° °Please use lidocaine patches at your site of pain.  Apply 1 patch at a time, leave on for 12 hours, then remove for 12 hours.  12 hours on, 12 hours off.  Do not apply more than 1 patch at a time. ° °

## 2022-04-19 DIAGNOSIS — Z3490 Encounter for supervision of normal pregnancy, unspecified, unspecified trimester: Secondary | ICD-10-CM | POA: Insufficient documentation

## 2022-04-19 HISTORY — DX: Encounter for supervision of normal pregnancy, unspecified, unspecified trimester: Z34.90

## 2022-04-22 DIAGNOSIS — Z8759 Personal history of other complications of pregnancy, childbirth and the puerperium: Secondary | ICD-10-CM | POA: Insufficient documentation

## 2022-04-22 DIAGNOSIS — O99210 Obesity complicating pregnancy, unspecified trimester: Secondary | ICD-10-CM | POA: Insufficient documentation

## 2022-04-22 DIAGNOSIS — O24111 Pre-existing diabetes mellitus, type 2, in pregnancy, first trimester: Secondary | ICD-10-CM

## 2022-04-22 HISTORY — DX: Obesity complicating pregnancy, unspecified trimester: O99.210

## 2022-04-22 HISTORY — DX: Pre-existing type 2 diabetes mellitus, in pregnancy, first trimester: O24.111

## 2022-04-28 ENCOUNTER — Ambulatory Visit (INDEPENDENT_AMBULATORY_CARE_PROVIDER_SITE_OTHER): Payer: BC Managed Care – PPO

## 2022-04-28 VITALS — Wt 225.0 lb

## 2022-04-28 DIAGNOSIS — Z369 Encounter for antenatal screening, unspecified: Secondary | ICD-10-CM

## 2022-04-28 DIAGNOSIS — Z3480 Encounter for supervision of other normal pregnancy, unspecified trimester: Secondary | ICD-10-CM

## 2022-04-28 DIAGNOSIS — Z3689 Encounter for other specified antenatal screening: Secondary | ICD-10-CM

## 2022-04-28 DIAGNOSIS — O0990 Supervision of high risk pregnancy, unspecified, unspecified trimester: Secondary | ICD-10-CM | POA: Insufficient documentation

## 2022-04-28 NOTE — Progress Notes (Signed)
New OB Intake  I connected with  Penny Gonzalez on 04/28/22 at 11:15 AM EST by telephone and verified that I am speaking with the correct person using two identifiers. Nurse is located at [facility] and pt is located at work.  I explained I am completing New OB Intake today. We discussed her EDD of 11/05/2022 that is based on LMP of 01/29/2022. Pt is G4/P0121. Pt is transferring her care to [place] from [facility]; states they hurt her during exam.  I reviewed her allergies, medications, Medical/Surgical/OB history, and appropriate screenings. Based on history, this is a/an pregnancy uncomplicated .   Patient Active Problem List   Diagnosis Date Noted   Recurrent major depressive disorder, in partial remission (HCC) 12/12/2018   Morbid obesity (HCC) 11/29/2017   Acanthosis nigricans 06/27/2017   Fatty liver 05/07/2016   Essential hypertension, benign 03/31/2016   Proteinuria 01/30/2016   Major depression 11/23/2015   Vitamin D deficiency 11/19/2015    Concerns addressed today None  Delivery Plans:  Plans to deliver at [facility].  Anatomy US Explained first scheduled [place] is 05/19/22 and an anatomy scan will be done at 20 weeks.  Labs NOB Labs and MaterniT 21 has been done thru [facility]  COVID Vaccine Patient has not had COVID vaccine.   Social Determinants of Health Food Insecurity: denies food insecurity Transportation: Patient denies transportation needs. Childcare: Discussed no children allowed at ultrasound appointments.   First visit review I reviewed new OB appt with pt. I explained she will have ob bloodwork and pap smear/pelvic exam if indicated. Explained pt will be seen by Dr. Nicholaus Bloom at first visit; encounter routed to appropriate provider.   Loran Senters, [facility] 04/28/2022  3:42 PM

## 2022-04-30 ENCOUNTER — Other Ambulatory Visit: Payer: Self-pay

## 2022-04-30 DIAGNOSIS — O99011 Anemia complicating pregnancy, first trimester: Secondary | ICD-10-CM | POA: Insufficient documentation

## 2022-04-30 DIAGNOSIS — Z3A13 13 weeks gestation of pregnancy: Secondary | ICD-10-CM | POA: Diagnosis not present

## 2022-04-30 DIAGNOSIS — O200 Threatened abortion: Secondary | ICD-10-CM | POA: Insufficient documentation

## 2022-04-30 DIAGNOSIS — O209 Hemorrhage in early pregnancy, unspecified: Secondary | ICD-10-CM | POA: Diagnosis present

## 2022-04-30 DIAGNOSIS — D649 Anemia, unspecified: Secondary | ICD-10-CM | POA: Diagnosis not present

## 2022-04-30 LAB — URINALYSIS, ROUTINE W REFLEX MICROSCOPIC
Bilirubin Urine: NEGATIVE
Glucose, UA: NEGATIVE mg/dL
Hgb urine dipstick: NEGATIVE
Ketones, ur: NEGATIVE mg/dL
Leukocytes,Ua: NEGATIVE
Nitrite: NEGATIVE
Protein, ur: NEGATIVE mg/dL
Specific Gravity, Urine: 1.027 (ref 1.005–1.030)
pH: 6 (ref 5.0–8.0)

## 2022-04-30 LAB — CBC WITH DIFFERENTIAL/PLATELET
Abs Immature Granulocytes: 0.05 10*3/uL (ref 0.00–0.07)
Basophils Absolute: 0 10*3/uL (ref 0.0–0.1)
Basophils Relative: 0 %
Eosinophils Absolute: 0.2 10*3/uL (ref 0.0–0.5)
Eosinophils Relative: 2 %
HCT: 31 % — ABNORMAL LOW (ref 36.0–46.0)
Hemoglobin: 10.3 g/dL — ABNORMAL LOW (ref 12.0–15.0)
Immature Granulocytes: 1 %
Lymphocytes Relative: 28 %
Lymphs Abs: 2.3 10*3/uL (ref 0.7–4.0)
MCH: 27.8 pg (ref 26.0–34.0)
MCHC: 33.2 g/dL (ref 30.0–36.0)
MCV: 83.6 fL (ref 80.0–100.0)
Monocytes Absolute: 0.4 10*3/uL (ref 0.1–1.0)
Monocytes Relative: 5 %
Neutro Abs: 5.2 10*3/uL (ref 1.7–7.7)
Neutrophils Relative %: 64 %
Platelets: 248 10*3/uL (ref 150–400)
RBC: 3.71 MIL/uL — ABNORMAL LOW (ref 3.87–5.11)
RDW: 14.9 % (ref 11.5–15.5)
WBC: 8.1 10*3/uL (ref 4.0–10.5)
nRBC: 0 % (ref 0.0–0.2)

## 2022-04-30 LAB — HCG, QUANTITATIVE, PREGNANCY: hCG, Beta Chain, Quant, S: 64227 m[IU]/mL — ABNORMAL HIGH (ref <5)

## 2022-04-30 LAB — LIPASE, BLOOD: Lipase: 36 U/L (ref 11–51)

## 2022-04-30 LAB — BASIC METABOLIC PANEL
Anion gap: 7 (ref 5–15)
BUN: 10 mg/dL (ref 6–20)
CO2: 24 mmol/L (ref 22–32)
Calcium: 8.6 mg/dL — ABNORMAL LOW (ref 8.9–10.3)
Chloride: 105 mmol/L (ref 98–111)
Creatinine, Ser: 0.56 mg/dL (ref 0.44–1.00)
GFR, Estimated: >60 mL/min (ref >60)
Glucose, Bld: 142 mg/dL — ABNORMAL HIGH (ref 70–99)
Potassium: 3.9 mmol/L (ref 3.5–5.1)
Sodium: 136 mmol/L (ref 135–145)

## 2022-04-30 LAB — ABO/RH: ABO/RH(D): O POS

## 2022-04-30 NOTE — ED Notes (Signed)
Pt to registration desk asking about wait times, updated patient, pt states sitting is making her pain worse. Pt ambulatory without difficulty.

## 2022-04-30 NOTE — ED Triage Notes (Signed)
Pt arrives with c/o lower ABD and vaginal pain. Per pt, she had a pelvic exam at her provider and after the pelvic she had been having pain. Per pt, she did have spotting after the pelvic exam for about 2 days, but it has stopped since then. Pt is currently [redacted] weeks pregnant.

## 2022-04-30 NOTE — ED Triage Notes (Signed)
FIRST NURSE NOTE:  Pt is [redacted] weeks pregnant due date 11/05/2022 pt c/o abd pain.

## 2022-05-01 ENCOUNTER — Emergency Department
Admission: EM | Admit: 2022-05-01 | Discharge: 2022-05-01 | Disposition: A | Discharge status: Home / Self Care | Payer: BC Managed Care – PPO | Attending: Emergency Medicine | Admitting: Emergency Medicine

## 2022-05-01 ENCOUNTER — Emergency Department: Payer: BC Managed Care – PPO

## 2022-05-01 DIAGNOSIS — D649 Anemia, unspecified: Secondary | ICD-10-CM

## 2022-05-01 DIAGNOSIS — O200 Threatened abortion: Secondary | ICD-10-CM

## 2022-05-01 DIAGNOSIS — O209 Hemorrhage in early pregnancy, unspecified: Secondary | ICD-10-CM

## 2022-05-01 DIAGNOSIS — R109 Unspecified abdominal pain: Secondary | ICD-10-CM

## 2022-05-01 NOTE — Discharge Instructions (Addendum)
You were seen in the emergency department for vaginal bleeding and lower abdominal pain.  You had an ultrasound that showed a fetus at 13 weeks with a good heartbeat.  Continue to follow-up with your gynecologist.  Return to the emergency department for worsening bleeding or abdominal pain.  Make sure that you are taking a prenatal vitamin and iron supplement given your anemia.

## 2022-05-01 NOTE — ED Notes (Signed)
DC instgructions given verbally and in writing, understanding voiced.  Signature obtained. Pt left in stable condition to POV with spouse.

## 2022-05-01 NOTE — ED Provider Notes (Signed)
[facility] Provider Note    Event Date/Time   First MD Initiated Contact with Patient 05/01/22 0024     (approximate)   History   Abdominal Pain   HPI  Penny Gonzalez is a 25 y.o. female G4, P1 at approximately [redacted] weeks gestational age who presents to the emergency department for vaginal bleeding and lower abdominal pain.  States that she has had some spotting since she had a pelvic exam on November 9.  Some mild lower abdominal pain.  Also endorses urinary urgency and frequency but no dysuria.  Came to the emergency department today for vaginal bleeding.  1 living child.  Denies prior miscarriages or ectopic pregnancies.  No nausea or vomiting.  Ultrasound at approximately [redacted] weeks gestational age that showed IUP.     Physical Exam   Triage Vital Signs: ED Triage Vitals  Enc Vitals Group     BP 04/30/22 2114 115/81     Pulse Rate 04/30/22 2114 86     Resp 04/30/22 2114 17     Temp 04/30/22 2114 98.6 F (37 C)     Temp Source 04/30/22 2114 Oral     SpO2 04/30/22 2114 97 %     Weight 04/30/22 2115 223 lb (101.2 kg)     Height 04/30/22 2115 5\' 9"  (1.753 m)     Head Circumference --      Peak Flow --      Pain Score 04/30/22 2123 8     Pain Loc --      Pain Edu? --      Excl. in GC? --     Most recent vital signs: Vitals:   04/30/22 2114 05/01/22 0045  BP: 115/81 117/74  Pulse: 86 81  Resp: 17 18  Temp: 98.6 F (37 C) 98.4 F (36.9 C)  SpO2: 97% 98%    Physical Exam Constitutional:      Appearance: She is well-developed.  HENT:     Head: Atraumatic.  Eyes:     Conjunctiva/sclera: Conjunctivae normal.  Cardiovascular:     Rate and Rhythm: Regular rhythm.  Pulmonary:     Effort: No respiratory distress.  Abdominal:     General: There is no distension.  Musculoskeletal:        General: Normal range of motion.     Cervical back: Normal range of motion.  Skin:    General: Skin is warm.  Neurological:     Mental Status:  She is alert. Mental status is at baseline.          IMPRESSION / MDM / ASSESSMENT AND PLAN / ED COURSE  I reviewed the triage vital signs and the nursing notes.  Differential diagnosis including threatened miscarriage, spotting with pregnancy, chorionic hematoma  Blood type is a positive.  UA without signs of urinary tract infection.  Will obtain transvaginal ultrasound to further evaluate for fetal heart rate and for signs of miscarriage.   RADIOLOGY I independently reviewed imaging, my interpretation of imaging: Transvaginal ultrasound IUP with fetal heart rate of 153    Read as IUP with fetal heart rate and approximately 13 weeks 1 day of gestational age  ED Results / Procedures / Treatments   Labs (all labs ordered are listed, but only abnormal results are displayed) Labs interpreted as -   Patient with anemia with a hemoglobin of 10.3.  Baseline of 10.39-month ago.  Discussed making sure that she had iron supplement and her prenatal vitamins.  Likely anemia with pregnancy.  Labs Reviewed  CBC WITH DIFFERENTIAL/PLATELET - Abnormal; Notable for the following components:      Result Value   RBC 3.71 (*)    Hemoglobin 10.3 (*)    HCT 31.0 (*)    All other components within normal limits  BASIC METABOLIC PANEL - Abnormal; Notable for the following components:   Glucose, Bld 142 (*)    Calcium 8.6 (*)    All other components within normal limits  HCG, QUANTITATIVE, PREGNANCY - Abnormal; Notable for the following components:   hCG, Beta Chain, Quant, S 54,627 (*)    All other components within normal limits  URINALYSIS, ROUTINE W REFLEX MICROSCOPIC - Abnormal; Notable for the following components:   Color, Urine YELLOW (*)    APPearance CLEAR (*)    All other components within normal limits  LIPASE, BLOOD  ABO/RH    Discussed follow-up with obstetrician.  Given return precautions for bleeding.   PROCEDURES:  Critical Care performed:  No  Procedures  Patient's presentation is most consistent with acute presentation with potential threat to life or bodily function.   MEDICATIONS ORDERED IN ED: Medications - No data to display  FINAL CLINICAL IMPRESSION(S) / ED DIAGNOSES   Final diagnoses:  Abdominal pain, unspecified abdominal location  Vaginal bleeding affecting early pregnancy  Threatened miscarriage     Rx / DC Orders   ED Discharge Orders     None        Note:  This document was prepared using Dragon voice recognition software and may include unintentional dictation errors.   Corena Herter, MD 05/01/22 [phone]

## 2022-05-10 ENCOUNTER — Telehealth: Payer: Self-pay

## 2022-05-10 NOTE — Telephone Encounter (Signed)
Pt called for a diflucan, she states she's pretty sure she has a yeast infection, I advised her to try Monistat 3 first. Then let us know if it doesn't help, in early pregnancy Monistat is preferred.

## 2022-05-11 ENCOUNTER — Encounter: Payer: Self-pay | Admitting: Obstetrics and Gynecology

## 2022-05-11 ENCOUNTER — Ambulatory Visit (INDEPENDENT_AMBULATORY_CARE_PROVIDER_SITE_OTHER): Payer: BC Managed Care – PPO | Admitting: Obstetrics and Gynecology

## 2022-05-11 ENCOUNTER — Other Ambulatory Visit (HOSPITAL_COMMUNITY)
Admission: RE | Admit: 2022-05-11 | Discharge: 2022-05-11 | Disposition: A | Discharge status: Home / Self Care | Payer: BC Managed Care – PPO | Source: Ambulatory Visit | Attending: Obstetrics & Gynecology | Admitting: Obstetrics & Gynecology

## 2022-05-11 VITALS — BP 120/82 | HR 95 | Ht 68.0 in | Wt 230.0 lb

## 2022-05-11 DIAGNOSIS — O09299 Supervision of pregnancy with other poor reproductive or obstetric history, unspecified trimester: Secondary | ICD-10-CM

## 2022-05-11 DIAGNOSIS — Z3A14 14 weeks gestation of pregnancy: Secondary | ICD-10-CM | POA: Diagnosis not present

## 2022-05-11 DIAGNOSIS — O24112 Pre-existing diabetes mellitus, type 2, in pregnancy, second trimester: Secondary | ICD-10-CM | POA: Diagnosis not present

## 2022-05-11 DIAGNOSIS — E119 Type 2 diabetes mellitus without complications: Secondary | ICD-10-CM | POA: Diagnosis not present

## 2022-05-11 DIAGNOSIS — O26892 Other specified pregnancy related conditions, second trimester: Secondary | ICD-10-CM | POA: Insufficient documentation

## 2022-05-11 DIAGNOSIS — N898 Other specified noninflammatory disorders of vagina: Secondary | ICD-10-CM | POA: Diagnosis present

## 2022-05-11 DIAGNOSIS — O09899 Supervision of other high risk pregnancies, unspecified trimester: Secondary | ICD-10-CM

## 2022-05-11 DIAGNOSIS — Z7984 Long term (current) use of oral hypoglycemic drugs: Secondary | ICD-10-CM

## 2022-05-11 DIAGNOSIS — O99891 Other specified diseases and conditions complicating pregnancy: Secondary | ICD-10-CM

## 2022-05-11 DIAGNOSIS — Z331 Pregnant state, incidental: Secondary | ICD-10-CM

## 2022-05-11 DIAGNOSIS — I10 Essential (primary) hypertension: Secondary | ICD-10-CM

## 2022-05-11 DIAGNOSIS — O24312 Unspecified pre-existing diabetes mellitus in pregnancy, second trimester: Secondary | ICD-10-CM

## 2022-05-11 DIAGNOSIS — O10012 Pre-existing essential hypertension complicating pregnancy, second trimester: Secondary | ICD-10-CM

## 2022-05-11 DIAGNOSIS — O09292 Supervision of pregnancy with other poor reproductive or obstetric history, second trimester: Secondary | ICD-10-CM

## 2022-05-11 HISTORY — DX: Supervision of other high risk pregnancies, unspecified trimester: O09.899

## 2022-05-11 HISTORY — DX: Supervision of pregnancy with other poor reproductive or obstetric history, unspecified trimester: O09.299

## 2022-05-11 LAB — POCT URINALYSIS DIPSTICK
Bilirubin, UA: NEGATIVE
Blood, UA: NEGATIVE
Glucose, UA: NEGATIVE
Ketones, UA: NEGATIVE
Leukocytes, UA: NEGATIVE
Nitrite, UA: NEGATIVE
Protein, UA: NEGATIVE
Spec Grav, UA: 1.015 (ref 1.010–1.025)
Urobilinogen, UA: 1 E.U./dL
pH, UA: 6 (ref 5.0–8.0)

## 2022-05-11 MED ORDER — TERCONAZOLE 0.4 % VA CREA: 1.0000 | TOPICAL_CREAM | Freq: Every day | VAGINAL | 0 refills | Status: AC

## 2022-05-11 NOTE — Progress Notes (Signed)
OB/GYN CLINIC PROGRESS NOTE  Subjective:    Patient ID: Penny Gonzalez, female    DOB: 1996/06/10, 26 y.o.   MRN: [id number]  HPI  Patient is a 26 y.o. [id number] at 55w4dwith a h/o DM (currently on Metformin) who presents for female who presents for creamy vaginal discharge for 3 week(s).  Denies abnormal vaginal bleeding or significant pelvic pain or fever. admits to urinary frequency and genital irritation. Patient has history of known exposure to STD seven years ago. Reports attempting to treat OTC with Monistat 3 yesterday for suspected yeast infection but this caused severe burning and itching and so desired to be seen today  [facility].  She was told she may have a yeast infection at that time, but was never offered any form of treatment.  Plans to transfer care here to [facility] Has appointment scheduled in next few weeks.  History of diabetes for 12 years. Checks blood sugars somewhat often.  Notes highest has been postprandial 180. Changed from Rebelsus to Metformin at beginning of the pregnancy. Currently on 500 mg daily. Most recent HgbA1c with NOB labs was 6.5.     OB History  Gravida Para Term Preterm AB Living  _0 SAB IAB Ectopic Multiple Live Births    2     1    # Outcome Date GA Lbr Len/2nd Weight Sex Delivery Anes PTL Lv  4 Current           3 IAB 07/31/21     TAB     2 IAB 01/04/20     TAB     1 Preterm 08/06/14 352w3d8 lb 15 oz (4.054 kg) F Vag-Spont  Y LIV     Complications: Pre-eclampsia    Past Medical History:  Diagnosis Date   Anemia, iron deficiency, inadequate dietary intake 12/01/2014   Depression    patient has not been officially diagnosed but has symptoms   Diabetes mellitus without complication (HCEast End   Essential hypertension, benign 03/31/2016   Irregular periods/menstrual cycles    Joint pain of leg    Major depression 11/23/2015     Past Surgical History:  Procedure Laterality Date   CHOLECYSTECTOMY N/A 05/19/2016   Procedure: LAPAROSCOPIC CHOLECYSTECTOMY WITH INTRAOPERATIVE CHOLANGIOGRAM;  Surgeon: JaLeonie GreenMD;  Location: [facility] ORS;  Service: General;  Laterality: N/A;    Family History  Problem Relation Age of Onset   Diabetes Mother    Arthritis Father    Diabetes Father    Heart disease Father    Hypertension Father    Stroke Father    Lupus Sister    Deafness Sister    Diabetes Maternal Grandmother    Hypertension Maternal Grandmother    Heart disease Paternal Grandmother    Hypertension Paternal Grandmother     Social History   Socioeconomic History   Marital status: Single    Spouse name: Not on file   Number of children: 1   Years of education: 13   Highest education level: High school graduate  Occupational History   Occupation: [employer]  Tobacco Use   Smoking status: Never   Smokeless tobacco: Never  Vaping Use   Vaping Use: Never used  Substance and Sexual Activity   Alcohol use: Not Currently    Comment: OCC   Drug use: No   Sexual activity:  Yes    Partners: Male  Other Topics Concern   Not on file  Social History Narrative   Not on file   Social Determinants of Health   Financial Resource Strain: Low Risk  (04/28/2022)   Overall Financial Resource Strain (CARDIA)    Difficulty of Paying Living Expenses: Not hard at all  Food Insecurity: No Food Insecurity (04/28/2022)   Hunger Vital Sign    Worried About Running Out of Food in the Last Year: Never true    Ran Out of Food in the Last Year: Never true  Transportation Needs: No Transportation Needs (04/28/2022)   PRAPARE - Hydrologist (Medical): No    Lack of Transportation (Non-Medical): No  Physical Activity: Inactive (04/28/2022)   Exercise Vital Sign    Days of Exercise per Week: 0 days    Minutes of Exercise per Session: 0 min  Stress: No Stress Concern Present  (04/28/2022)   [place]    Feeling of Stress : Only a little  Social Connections: Moderately Integrated (04/28/2022)   Social Connection and Isolation Panel [NHANES]    Frequency of Communication with Friends and Family: More than three times a week    Frequency of Social Gatherings with Friends and Family: Once a week    Attends Religious Services: 1 to 4 times per year    Active Member of [employer] or Organizations: No    Attends Archivist Meetings: Never    Marital Status: Living with partner  Intimate Partner Violence: Not At Risk (04/28/2022)   Humiliation, Afraid, Rape, and Kick questionnaire    Fear of Current or Ex-Partner: No    Emotionally Abused: No    Physically Abused: No    Sexually Abused: No    Current Outpatient Medications on File Prior to Visit  Medication Sig Dispense Refill   blood glucose meter kit and supplies KIT Dispense based on patient and insurance preference. Use daily in the am before breakfast.  For Dx DM type 2, E11.69 1 each 0   glucose blood (PRECISION QID TEST) test strip 1 each by Other route 2 (two) times daily.     metFORMIN (GLUCOPHAGE) 500 MG tablet Take 500 mg by mouth daily with breakfast.     prenatal vitamin w/FE, FA (NATACHEW) 29-1 MG CHEW chewable tablet Chew 2 tablets by mouth daily at 12 noon.     No current facility-administered medications on file prior to visit.    Allergies  Allergen Reactions   Latex Rash    Pruritic, urticaria    Objective:    BP 120/82   Pulse 95   Ht _0  (1.727 m)   Wt 230 lb (104.3 kg)   LMP 01/29/2022 (Exact Date)   BMI 34.97 kg/m   General appearance: alert and no distress Abdomen: soft, non-tender; bowel sounds normal; no masses,  no organomegaly Pelvic: external genitalia normal, rectovaginal septum normal.  Vagina with moderate white discharge, as well as vaginal cream identified.  Cervix with limited visualization  due to vaginal cream, but overall normal appearing, no lesions.  Bimanual exam not performed.  Extremities: extremities normal, atraumatic, no cyanosis or edema Skin: Skin color, texture, turgor normal. No rashes or lesions Neurologic: Grossly normal   Assessment:   1. Vaginal discharge during pregnancy in second trimester   2. Pre-existing diabetes mellitus during pregnancy in second trimester   3. History of pre-eclampsia in prior pregnancy, currently  pregnant   4. [redacted] weeks gestation of pregnancy   5. Essential hypertension, benign     Plan:  - Vaginitis probe sent to lab.  - Treatment: Terazol cream and abstain from coitus during course of treatment as vaginal discharge likely consistent with yeast infection.    - Pre-existing DM, currently on metformin 500 mg daily. Advised that she may likely need to increase to BID dosing depending on blood sugars. Encouraged to check blood sugars 4 x daily for the next 2 weeks and return with blood sugar log at next appointment. Given blood sugar log to complete.  - Has questions regarding recommendations to start aspirin made by previous OB provider. I discussed th rationale behind initiation of  aspirin for prevention of pre-eclampsia, especially in light of patient's history. Notes understanding, is willing to start.  - Follow up in 2 weeks for NOB visit as scheduled. Will cancel dating scan as patient has recently had one.  Will place referral to MFM for co-management of DM and anatomy scan. Will need fetal echo.    A total of 32 minutes were spent during this encounter, including review of previous progress notes, recent imaging and labs, face-to-face with time with patient involving counseling and coordination of care, as well as documentation for current visit.   Rubie Maid, MD [facility] at [facility]

## 2022-05-13 LAB — CERVICOVAGINAL ANCILLARY ONLY
Bacterial Vaginitis (gardnerella): NEGATIVE
Candida Glabrata: NEGATIVE
Candida Vaginitis: POSITIVE — AB
Chlamydia: NEGATIVE
Comment: NEGATIVE
Comment: NEGATIVE
Comment: NEGATIVE
Comment: NEGATIVE
Comment: NEGATIVE
Comment: NORMAL
Neisseria Gonorrhea: NEGATIVE
Trichomonas: NEGATIVE

## 2022-05-17 ENCOUNTER — Telehealth: Payer: Self-pay

## 2022-05-17 NOTE — Telephone Encounter (Signed)
Left voicemail to return call. 

## 2022-05-19 ENCOUNTER — Telehealth: Payer: Self-pay

## 2022-05-19 ENCOUNTER — Other Ambulatory Visit: Payer: BC Managed Care – PPO

## 2022-05-19 NOTE — Telephone Encounter (Signed)
Penny Gonzalez called triage line about she got some oxycodone yesterday due to an abscess and she wanted to know if it was safe to take. I told her if all possible try to stay away from it and use Tylenol instead

## 2022-05-23 ENCOUNTER — Ambulatory Visit (INDEPENDENT_AMBULATORY_CARE_PROVIDER_SITE_OTHER): Payer: BC Managed Care – PPO | Admitting: Obstetrics & Gynecology

## 2022-05-23 VITALS — BP 133/85 | Wt 228.0 lb

## 2022-05-23 DIAGNOSIS — O09299 Supervision of pregnancy with other poor reproductive or obstetric history, unspecified trimester: Secondary | ICD-10-CM

## 2022-05-23 DIAGNOSIS — O24312 Unspecified pre-existing diabetes mellitus in pregnancy, second trimester: Secondary | ICD-10-CM

## 2022-05-23 DIAGNOSIS — O99210 Obesity complicating pregnancy, unspecified trimester: Secondary | ICD-10-CM

## 2022-05-23 DIAGNOSIS — O99012 Anemia complicating pregnancy, second trimester: Secondary | ICD-10-CM

## 2022-05-23 DIAGNOSIS — Z3A16 16 weeks gestation of pregnancy: Secondary | ICD-10-CM

## 2022-05-23 MED ORDER — ASPIRIN 81 MG PO TBEC: 81.0000 mg | DELAYED_RELEASE_TABLET | Freq: Every day | ORAL | 4 refills | Status: DC

## 2022-05-23 MED ORDER — FERROUS SULFATE 325 (65 FE) MG PO TABS: 325.0000 mg | ORAL_TABLET | Freq: Two times a day (BID) | ORAL | 1 refills | Status: DC

## 2022-05-23 NOTE — Progress Notes (Signed)
   PRENATAL VISIT NOTE  Subjective:  Penny Gonzalez is a 26 y.o. [phone] at [redacted]w[redacted]d being seen today for ongoing prenatal care.  She was seen previously at [facility]. She is currently monitored for the following issues for this high-risk pregnancy and has Pre-existing diabetes mellitus during pregnancy in second trimester; Vitamin D deficiency; Major depression; Proteinuria; Essential hypertension, benign; Fatty liver; Acanthosis nigricans; Morbid obesity (HCC); Recurrent major depressive disorder, in partial remission (HCC); Supervision of high risk pregnancy, antepartum; Vaginal discharge during pregnancy in second trimester; History of preterm delivery, currently pregnant; and History of pre-eclampsia in prior pregnancy, currently pregnant on their problem list.  Patient reports  some overall joint pains, sometimes feels light-headed .   .  .   . Denies leaking of fluid.  She just moved to [facility] and thinks that she may deliver at [facility].   The following portions of the patient's history were reviewed and updated as appropriate: allergies, current medications, past family history, past medical history, past social history, past surgical history and problem list.   Objective:   Vitals:   05/23/22 1442  BP: 133/85  Weight: 228 lb (103.4 kg)    Fetal Status:           General:  Alert, oriented and cooperative. Patient is in no acute distress.  Skin: Skin is warm and dry. No rash noted.   Cardiovascular: Normal heart rate noted  Respiratory: Normal respiratory effort, no problems with respiration noted  Abdomen: Soft, gravid, appropriate for gestational age.        Pelvic: Cervical exam deferred        Extremities: Normal range of motion.     Mental Status: Normal mood and affect. Normal behavior. Normal judgment and thought content.   Assessment and Plan:  Pregnancy: [id number] at [redacted]w[redacted]d 1. Obesity in pregnancy - rec 15 pound total weight gain -MFM ultrasound at 19 weeks  already scheduled  2. History of pre-eclampsia in prior pregnancy, currently pregnant I prescribed baby asa and rec'd that she take it to help decrease risk of recurrent pre e  3. Pre-existing diabetes mellitus during pregnancy in second trimester She showed me her sugar log. All recorded sugars are less than 100. She take metformin 500 mg at randsom times once per day. Most of her recorded sugars are her fasting levels. She has had DM since she was 26 years old.  - I have strongly rec'd that she check and record her sugars fastings, 2 hour PCs and that she take her metformin consistently at bedtime.  4. [redacted] weeks gestation of pregnancy - I have rec'd that she decide which hospital she wants to deliver at and then go to the practice associated with that hospital.  5. Anemia during pregnancy in second trimester - iron prescribed BID   Preterm labor symptoms and general obstetric precautions including but not limited to vaginal bleeding, contractions, leaking of fluid and fetal movement were reviewed in detail with the patient. Please refer to After Visit Summary for other counseling recommendations.   Return in about 4 weeks (around 06/20/2022).  Future Appointments  Date Time Provider Department Center  06/14/2022  1:00 PM [facility]  06/14/2022  2:00 PM [facility] CONSULT [facility] None    Allie Bossier, MD

## 2022-05-23 NOTE — Progress Notes (Signed)
ROB. No VB. No LOF. Pt transfer from [facility]. Needs Iron or new prenatal. She is on gummies with no iron.

## 2022-06-02 ENCOUNTER — Other Ambulatory Visit: Payer: Self-pay

## 2022-06-02 NOTE — Telephone Encounter (Signed)
She has been taking it. Please see if patient is taking still once a day, or if she increased to twice daily.   Then can refill.

## 2022-06-02 NOTE — Telephone Encounter (Signed)
Patient contacted office requesting refill on metformin, patient was last seen in office on 05/11/22, medication is listed as historically entered in chart as of 06/28/21. Medication looks like it was last filled 12/26/17 please advise if appropriate for patient to continue this medication? KW

## 2022-06-08 ENCOUNTER — Other Ambulatory Visit: Payer: Self-pay

## 2022-06-08 MED ORDER — METFORMIN HCL 500 MG PO TABS: 500.0000 mg | ORAL_TABLET | Freq: Every day | ORAL | 3 refills | Status: DC

## 2022-06-08 NOTE — Telephone Encounter (Signed)
Sent pt a MyChart msg.

## 2022-06-09 NOTE — Telephone Encounter (Signed)
Was already sent in

## 2022-06-13 NOTE — L&D Delivery Note (Signed)
Delivery Summary for Penny Gonzalez  Labor Events:   Preterm labor: No data found  Rupture date: 10/15/2022  Rupture time: 2:27 PM  Rupture type: Artificial Intact  Fluid Color: Clear White  Induction: No data found  Augmentation: No data found  Complications: No data found  Cervical ripening: No data found No data found   No data found     Delivery:   Episiotomy: No data found  Lacerations: No data found  Repair suture: No data found  Repair # of packets: No data found  Blood loss (ml): 200   Information for the patient's newborn:  Shervon, Segers [[id number]]   Delivery 10/15/2022 8:04 PM by  Vaginal, Spontaneous Sex:  child Gestational Age: [redacted]w[redacted]d Delivery Clinician:   Living?:         APGARS  One minute Five minutes Ten minutes  Skin color:        Heart rate:        Grimace:        Muscle tone:        Breathing:        Totals:         Presentation/position:      Resuscitation:   Cord information:    Disposition of cord blood:     Blood gases sent?  Complications:   Placenta: Delivered:       appearance Newborn Measurements: Weight:    Height:    Head circumference:    Chest circumference:    Other providers:    Additional  information: Forceps:   Vacuum:   Breech:   Observed anomalies        {Delivery OZHY:[id number]}  Hildred Laser 10/15/2022, 8:24 PM

## 2022-06-14 ENCOUNTER — Ambulatory Visit: Payer: BC Managed Care – PPO | Attending: Obstetrics and Gynecology

## 2022-06-14 ENCOUNTER — Ambulatory Visit (HOSPITAL_BASED_OUTPATIENT_CLINIC_OR_DEPARTMENT_OTHER): Payer: BC Managed Care – PPO | Admitting: Obstetrics and Gynecology

## 2022-06-14 ENCOUNTER — Other Ambulatory Visit: Payer: Self-pay

## 2022-06-14 ENCOUNTER — Other Ambulatory Visit: Payer: Self-pay | Admitting: Obstetrics and Gynecology

## 2022-06-14 VITALS — BP 133/81 | HR 95 | Temp 98.8°F | Ht 68.0 in | Wt 235.5 lb

## 2022-06-14 DIAGNOSIS — O09212 Supervision of pregnancy with history of pre-term labor, second trimester: Secondary | ICD-10-CM

## 2022-06-14 DIAGNOSIS — E118 Type 2 diabetes mellitus with unspecified complications: Secondary | ICD-10-CM | POA: Insufficient documentation

## 2022-06-14 DIAGNOSIS — O10012 Pre-existing essential hypertension complicating pregnancy, second trimester: Secondary | ICD-10-CM

## 2022-06-14 DIAGNOSIS — Z3A19 19 weeks gestation of pregnancy: Secondary | ICD-10-CM

## 2022-06-14 DIAGNOSIS — O24112 Pre-existing diabetes mellitus, type 2, in pregnancy, second trimester: Secondary | ICD-10-CM

## 2022-06-14 DIAGNOSIS — Z363 Encounter for antenatal screening for malformations: Secondary | ICD-10-CM

## 2022-06-14 DIAGNOSIS — O10912 Unspecified pre-existing hypertension complicating pregnancy, second trimester: Secondary | ICD-10-CM | POA: Insufficient documentation

## 2022-06-14 DIAGNOSIS — O09299 Supervision of pregnancy with other poor reproductive or obstetric history, unspecified trimester: Secondary | ICD-10-CM

## 2022-06-14 DIAGNOSIS — O09292 Supervision of pregnancy with other poor reproductive or obstetric history, second trimester: Secondary | ICD-10-CM | POA: Diagnosis not present

## 2022-06-14 DIAGNOSIS — Z3A14 14 weeks gestation of pregnancy: Secondary | ICD-10-CM

## 2022-06-14 DIAGNOSIS — E119 Type 2 diabetes mellitus without complications: Secondary | ICD-10-CM | POA: Diagnosis not present

## 2022-06-14 DIAGNOSIS — O0990 Supervision of high risk pregnancy, unspecified, unspecified trimester: Secondary | ICD-10-CM

## 2022-06-14 DIAGNOSIS — Z7984 Long term (current) use of oral hypoglycemic drugs: Secondary | ICD-10-CM | POA: Insufficient documentation

## 2022-06-14 DIAGNOSIS — O24312 Unspecified pre-existing diabetes mellitus in pregnancy, second trimester: Secondary | ICD-10-CM

## 2022-06-14 DIAGNOSIS — I10 Essential (primary) hypertension: Secondary | ICD-10-CM

## 2022-06-14 DIAGNOSIS — N898 Other specified noninflammatory disorders of vagina: Secondary | ICD-10-CM

## 2022-06-14 NOTE — Progress Notes (Signed)
Maternal-Fetal Medicine   Name: Penny Gonzalez DOB: 1995/08/04 MRN: [id number] Referring Provider: Rubie Maid, MD  I had the pleasure of seeing Penny Gonzalez today at [facility]. She is G4 P1021 at 19w 3d gestation and is here for fetal anatomy scan and consultation. Patient states type 2 diabetes that was diagnosed more than 5 years ago.  Recent hemoglobin A1c was 6.5% and it was 9.2% 3 years ago.  Patient takes metformin 500 mg at night.  She reports her fasting and postprandial levels are within normal range and that she checks her blood glucose regularly.  She does not have neuropathy or nephropathy.  She has not had ophthalmology examination to rule out retinopathy and reports she will be making an appointment. Patient has a diagnosis of chronic hypertension but reports that her blood pressures have always been normal and she does not take antihypertensives.  She reports no other chronic medical conditions.  Past surgical history: Laparoscopic cholecystectomy (2017). Medications: Prenatal vitamins, low-dose aspirin, metformin. Allergies: Latex (rashes). Social history: Denies tobacco or drug or alcohol use.  Her partner is in good health. Family history: No history of venous thromboembolism in the family.  Obstetric history is significant for a preterm vaginal delivery at [redacted] weeks gestation of a female infant weighing 4,054 g (8-15) at birth.  Her daughter is in good health. Her pregnancy was complicated by preeclampsia. Prenatal course: On cell-free fetal DNA screening, the risks of fetal aneuploidies are not increased.  Blood pressure today at her office is 133/81 mmHg.  Pulse 95/minute.  Ultrasound We performed fetal anatomical survey.  Amniotic fluid is normal good fetal activity seen.  No markers of aneuploidies or fetal structural defects are seen.  Fetal anatomical survey appears normal.  Patient understands limitations of ultrasound in detecting fetal  anomalies.  Pregestational diabetes Based on her history, it seems that her blood glucose levels are within normal range, I explained the normal parameters of blood glucose levels.  I discussed the importance of good control of diabetes to prevent adverse fetal and neonatal outcomes. The likelihood of congenital malformations with a periconception hemoglobin A1C of 6.5% is very low. However, fetal congenital malformations are slightly increased even if diabetes is well controlled. I discussed and recommended fetal echocardiography.  Other complications include featl macrosomia leading shoulder dystocia or neurological injuries at birth (Erb's palsy). Neonatal complications including respiratory-distress syndrome, hypoglycemia can also occur. In poorly-controlled diabetes, the likelihood of stillbirth is increased.  I discussed management that includes diet, exercise, and insulin. If diabetes is not well controlled with metformin alone, insulin should be added.  I discussed ultrasound protocol. Serial fetal growth assessments, and weekly antenatal testing beginning at 32 weeks till delivery will be performed.  Delivery is recommended at 39 weeks if diabetes is well controlled. Early term delivery (37 to 39 weeks) should be considered if diabetes is not well controlled.  Diabetes increases the likelihood of development of preeclampsia. Low-dose aspirin delays or prevents preeclampsia.  Recommendations -An appointment was made for her to return in 5 weeks for completion of fetal anatomy. -Fetal growth assessments every 4 weeks. -Weekly BPP from [redacted] weeks gestation till delivery. -Delivery at [redacted] weeks gestation provided diabetes is well-controlled. -Encouraged the patient to make an appointment with ophthalmologist to rule out retinopathy.  Thank you for consultation.  If you have any questions or concerns, please contact me the [facility].  Consultation including face-to-face  counseling (more than 50% of time spent) is 30  minutes.

## 2022-06-17 ENCOUNTER — Other Ambulatory Visit: Payer: Self-pay

## 2022-06-17 DIAGNOSIS — N898 Other specified noninflammatory disorders of vagina: Secondary | ICD-10-CM

## 2022-06-17 MED ORDER — TERCONAZOLE 0.4 % VA CREA: 1.0000 | TOPICAL_CREAM | Freq: Every day | VAGINAL | 0 refills | Status: DC

## 2022-06-17 NOTE — Telephone Encounter (Signed)
Penny Gonzalez prescribe Terazole cream nightly for 7 days (she already tried the Diflucan several weeks ago, so likely needs longer treatment regimen).  If still no relief, she should follow up in the office for additional testing.

## 2022-06-20 ENCOUNTER — Encounter: Payer: Self-pay | Admitting: Obstetrics

## 2022-06-20 ENCOUNTER — Ambulatory Visit (INDEPENDENT_AMBULATORY_CARE_PROVIDER_SITE_OTHER): Payer: BC Managed Care – PPO | Admitting: Obstetrics

## 2022-06-20 ENCOUNTER — Other Ambulatory Visit (HOSPITAL_COMMUNITY)
Admission: RE | Admit: 2022-06-20 | Discharge: 2022-06-20 | Disposition: A | Discharge status: Home / Self Care | Payer: BC Managed Care – PPO | Source: Ambulatory Visit | Attending: Obstetrics | Admitting: Obstetrics

## 2022-06-20 VITALS — BP 132/78 | HR 99 | Wt 237.1 lb

## 2022-06-20 DIAGNOSIS — Z3A20 20 weeks gestation of pregnancy: Secondary | ICD-10-CM | POA: Diagnosis not present

## 2022-06-20 DIAGNOSIS — N898 Other specified noninflammatory disorders of vagina: Secondary | ICD-10-CM | POA: Diagnosis not present

## 2022-06-20 DIAGNOSIS — O99210 Obesity complicating pregnancy, unspecified trimester: Secondary | ICD-10-CM

## 2022-06-20 DIAGNOSIS — O24112 Pre-existing diabetes mellitus, type 2, in pregnancy, second trimester: Secondary | ICD-10-CM

## 2022-06-20 DIAGNOSIS — O98812 Other maternal infectious and parasitic diseases complicating pregnancy, second trimester: Secondary | ICD-10-CM | POA: Insufficient documentation

## 2022-06-20 DIAGNOSIS — O09212 Supervision of pregnancy with history of pre-term labor, second trimester: Secondary | ICD-10-CM

## 2022-06-20 DIAGNOSIS — O0992 Supervision of high risk pregnancy, unspecified, second trimester: Secondary | ICD-10-CM

## 2022-06-20 DIAGNOSIS — O99212 Obesity complicating pregnancy, second trimester: Secondary | ICD-10-CM

## 2022-06-20 DIAGNOSIS — O09299 Supervision of pregnancy with other poor reproductive or obstetric history, unspecified trimester: Secondary | ICD-10-CM

## 2022-06-20 DIAGNOSIS — B3731 Acute candidiasis of vulva and vagina: Secondary | ICD-10-CM | POA: Diagnosis not present

## 2022-06-20 DIAGNOSIS — O24312 Unspecified pre-existing diabetes mellitus in pregnancy, second trimester: Secondary | ICD-10-CM

## 2022-06-20 DIAGNOSIS — E119 Type 2 diabetes mellitus without complications: Secondary | ICD-10-CM

## 2022-06-20 NOTE — Progress Notes (Signed)
ROB [redacted]w[redacted]d: She is doing well. Has good fetal movement. She has some vaginal irritation, self swab obtained.

## 2022-06-20 NOTE — Progress Notes (Signed)
Routine Prenatal Care Visit  Subjective  Penny Gonzalez is a 27 y.o. [phone] at [redacted]w[redacted]d being seen today for ongoing prenatal care.  She is currently monitored for the following issues for this high-risk pregnancy and has Pre-existing diabetes mellitus during pregnancy in second trimester; Vitamin D deficiency; Major depression; Proteinuria; Essential hypertension, benign; Fatty liver; Acanthosis nigricans; Morbid obesity (HCC); Recurrent major depressive disorder, in partial remission (HCC); Supervision of high risk pregnancy, antepartum; Vaginal discharge during pregnancy in second trimester; History of preterm delivery, currently pregnant; and History of pre-eclampsia in prior pregnancy, currently pregnant on their problem list.  ----------------------------------------------------------------------------------- Patient reports difficulty with lower back pain and sciatica. She has her next OB visit with the [facility] for [facility] in [place] as she now lives there.Planning to transfer her care.she is checking her glucose levels as advised. Very uncomfortable with lower back pain and likely sciatic pain.  . Vag. Bleeding: None.  Movement: Present. Leaking Fluid denies.  ----------------------------------------------------------------------------------- The following portions of the patient's history were reviewed and updated as appropriate: allergies, current medications, past family history, past medical history, past social history, past surgical history and problem list. Problem list updated.  Objective  Blood pressure 132/78, pulse 99, weight 237 lb 1.6 oz (107.5 kg), last menstrual period 01/29/2022. Pregravid weight 214 lb (97.1 kg) Total Weight Gain 23 lb 1.6 oz (10.5 kg) Urinalysis: Urine Protein    Urine Glucose    Fetal Status:     Movement: Present     General:  Alert, oriented and cooperative. Patient is in no acute distress.  Skin: Skin is warm and dry. No rash noted.    Cardiovascular: Normal heart rate noted  Respiratory: Normal respiratory effort, no problems with respiration noted  Abdomen: Soft, gravid, appropriate for gestational age. Pain/Pressure: Present     Pelvic:  Cervical exam deferred        Extremities: Normal range of motion.     Mental Status: Normal mood and affect. Normal behavior. Normal judgment and thought content.   Assessment   27 y.o. [id number] at [redacted]w[redacted]d by  11/05/2022, by Last Menstrual Period presenting for routine prenatal visit  Plan   four Problems (from 04/28/22 to present)     Problem Noted Resolved   Supervision of high risk pregnancy, antepartum 04/28/2022 by Loran Senters, CMA No   Overview Addendum 04/28/2022  3:46 PM by Loran Senters, CMA     Clinical Staff Provider  Office Location  [facility] Dating  Not found.  Language  English Anatomy US    Flu Vaccine  offer Genetic Screen  NIPS:   TDaP vaccine   offer Hgb A1C or  GTT Early : Third trimester :   Covid declined   LAB RESULTS   Rhogam     Blood Type     Feeding Plan breast Antibody    Contraception tubal Rubella    Circumcision yes RPR     Pediatrician  undecided HBsAg     Support Person Ethelene Browns HIV    Prenatal Classes yes Varicella     GBS  (For PCN allergy, check sensitivities)   BTL Consent  Hep C     VBAC Consent  Pap Diagnosis  Date Value Ref Range Status  11/28/2018   Final   NEGATIVE FOR INTRAEPITHELIAL LESIONS OR MALIGNANCY.  11/28/2018   Final   FUNGAL ORGANISMS PRESENT CONSISTENT WITH CANDIDA SPP.      Hgb Electro      CF  SMA                   Preterm labor symptoms and general obstetric precautions including but not limited to vaginal bleeding, contractions, leaking of fluid and fetal movement were reviewed in detail with the patient. Please refer to After Visit Summary for other counseling recommendations.  Reviewed her glucose readings on her phone. I encouraged her to write these down. The majority are WNL. She  continues taking Metformin every night. Aptima swab sent today due to her c/o some vaginal irritation. We will contact her with results. She will also have another scan to complete her anatomy scan.   Return in about 4 weeks (around 07/18/2022) for ROB in 4 weeks.  Imagene Riches, CNM  06/20/2022 5:25 PM

## 2022-06-22 LAB — CERVICOVAGINAL ANCILLARY ONLY
Bacterial Vaginitis (gardnerella): NEGATIVE
Candida Glabrata: NEGATIVE
Candida Vaginitis: POSITIVE — AB
Comment: NEGATIVE
Comment: NEGATIVE
Comment: NEGATIVE

## 2022-06-23 ENCOUNTER — Encounter: Payer: Self-pay | Admitting: Obstetrics

## 2022-06-30 ENCOUNTER — Telehealth: Payer: Self-pay

## 2022-06-30 NOTE — Telephone Encounter (Signed)
Patient called with concerns that the Terazol cream that was prescribed to her for yeast infections are causing her to have some abdominal cramps that comes and goes and very irritable. She reports that the cramping feels like period cramps. She is [redacted]w[redacted]d. She would like to know if she can take something else for her yeast infection. Is it ok to prescribe her a Diflucan. Please advise.  CB

## 2022-06-30 NOTE — Telephone Encounter (Signed)
She was given the Terazol due to recurrence after taking the Diflucan originally. We can give a prescription for the Diflucan to take every 3 days x 3 doses and maybe this will help to clear this up.

## 2022-06-30 NOTE — Addendum Note (Signed)
Addended by: Chilton Greathouse on: 06/30/2022 03:56 PM   Modules accepted: Orders

## 2022-07-01 MED ORDER — FLUCONAZOLE 150 MG PO TABS: 150.0000 mg | ORAL_TABLET | ORAL | 0 refills | Status: DC

## 2022-07-01 NOTE — Addendum Note (Signed)
Addended by: Chilton Greathouse on: 07/01/2022 08:29 AM   Modules accepted: Orders

## 2022-07-01 NOTE — Telephone Encounter (Signed)
Medication has been sent. Patient notified through mychart.

## 2022-07-08 ENCOUNTER — Telehealth: Payer: BC Managed Care – PPO | Admitting: Physician Assistant

## 2022-07-08 ENCOUNTER — Inpatient Hospital Stay (HOSPITAL_COMMUNITY)
Admission: AD | Admit: 2022-07-08 | Discharge: 2022-07-08 | Disposition: A | Discharge status: Home / Self Care | Payer: BC Managed Care – PPO | Attending: Obstetrics & Gynecology | Admitting: Obstetrics & Gynecology

## 2022-07-08 ENCOUNTER — Encounter (HOSPITAL_COMMUNITY): Payer: Self-pay | Admitting: Obstetrics & Gynecology

## 2022-07-08 DIAGNOSIS — Z3A22 22 weeks gestation of pregnancy: Secondary | ICD-10-CM | POA: Diagnosis not present

## 2022-07-08 DIAGNOSIS — R102 Pelvic and perineal pain: Secondary | ICD-10-CM

## 2022-07-08 DIAGNOSIS — O0990 Supervision of high risk pregnancy, unspecified, unspecified trimester: Secondary | ICD-10-CM

## 2022-07-08 DIAGNOSIS — O09212 Supervision of pregnancy with history of pre-term labor, second trimester: Secondary | ICD-10-CM | POA: Diagnosis not present

## 2022-07-08 DIAGNOSIS — O99212 Obesity complicating pregnancy, second trimester: Secondary | ICD-10-CM | POA: Diagnosis not present

## 2022-07-08 DIAGNOSIS — R5381 Other malaise: Secondary | ICD-10-CM

## 2022-07-08 DIAGNOSIS — O09292 Supervision of pregnancy with other poor reproductive or obstetric history, second trimester: Secondary | ICD-10-CM | POA: Insufficient documentation

## 2022-07-08 DIAGNOSIS — Z20828 Contact with and (suspected) exposure to other viral communicable diseases: Secondary | ICD-10-CM | POA: Diagnosis not present

## 2022-07-08 DIAGNOSIS — O24112 Pre-existing diabetes mellitus, type 2, in pregnancy, second trimester: Secondary | ICD-10-CM | POA: Diagnosis present

## 2022-07-08 DIAGNOSIS — R519 Headache, unspecified: Secondary | ICD-10-CM

## 2022-07-08 DIAGNOSIS — O1202 Gestational edema, second trimester: Secondary | ICD-10-CM

## 2022-07-08 DIAGNOSIS — O24319 Unspecified pre-existing diabetes mellitus in pregnancy, unspecified trimester: Secondary | ICD-10-CM

## 2022-07-08 DIAGNOSIS — O26892 Other specified pregnancy related conditions, second trimester: Secondary | ICD-10-CM | POA: Insufficient documentation

## 2022-07-08 HISTORY — DX: Sciatica, unspecified side: M54.30

## 2022-07-08 LAB — URINALYSIS, ROUTINE W REFLEX MICROSCOPIC
Bilirubin Urine: NEGATIVE
Glucose, UA: 50 mg/dL — AB
Hgb urine dipstick: NEGATIVE
Ketones, ur: 5 mg/dL — AB
Nitrite: NEGATIVE
Protein, ur: NEGATIVE mg/dL
Specific Gravity, Urine: 1.026 (ref 1.005–1.030)
pH: 5 (ref 5.0–8.0)

## 2022-07-08 LAB — GLUCOSE, CAPILLARY: Glucose-Capillary: 125 mg/dL — ABNORMAL HIGH (ref 70–99)

## 2022-07-08 MED ORDER — ACETAMINOPHEN 500 MG PO TABS
1000.0000 mg | ORAL_TABLET | Freq: Once | ORAL | Status: AC
  Administered 2022-07-08: 1000 mg via ORAL
  Filled 2022-07-08: qty 2

## 2022-07-08 MED ORDER — OSELTAMIVIR PHOSPHATE 75 MG PO CAPS: 75.0000 mg | ORAL_CAPSULE | Freq: Two times a day (BID) | ORAL | 0 refills | Status: AC

## 2022-07-08 MED ORDER — OSELTAMIVIR PHOSPHATE 75 MG PO CAPS
75.0000 mg | ORAL_CAPSULE | Freq: Once | ORAL | Status: AC
  Administered 2022-07-08: 75 mg via ORAL
  Filled 2022-07-08: qty 1

## 2022-07-08 MED ORDER — METFORMIN HCL 500 MG PO TABS
500.0000 mg | ORAL_TABLET | Freq: Once | ORAL | Status: AC
  Administered 2022-07-08: 500 mg via ORAL
  Filled 2022-07-08: qty 1

## 2022-07-08 MED ORDER — CYCLOBENZAPRINE HCL 10 MG PO TABS: 10.0000 mg | ORAL_TABLET | Freq: Three times a day (TID) | ORAL | 2 refills | Status: DC | PRN

## 2022-07-08 NOTE — MAU Provider Note (Signed)
Obstetric Attending MAU Note  Chief Complaint:  hands hurt   Event Date/Time   First Provider Initiated Contact with Patient 07/08/22 2144     HPI: Penny Gonzalez is a 27 y.o. [id number] at 4481w6d who presents to maternity admissions reporting multiple symptoms.  Known history of T2DM and sciatica, and her daughter was diagnosed with Influenza today. Reports swelling in hands and feet x 2 days, ameliorated by elevating her legs.  Also some back pain and muscle aches, malaise.  Feels pressure in her vagina and reports mild lower abdominal pain x 2 weeks.  Feels overall "not well". Also has headache that started around 1700. Not coughing yet, no fevers. Denies any abnormal vaginal discharge, fevers, chills, sweats, dysuria, nausea, vomiting, other GI or GU symptoms or other general symptoms. Denies contractions, leakage of fluid or vaginal bleeding. Good fetal movement.   Pregnancy Course: Receives care at [facility].  Patient Active Problem List   Diagnosis Date Noted   Vaginal discharge during pregnancy in second trimester 05/11/2022   History of preterm delivery, currently pregnant 05/11/2022   History of pre-eclampsia in prior pregnancy, currently pregnant 05/11/2022   Supervision of high risk pregnancy, antepartum 04/28/2022   Recurrent major depressive disorder, in partial remission (HCC) 12/12/2018   Morbid obesity (HCC) 11/29/2017   Acanthosis nigricans 06/27/2017   Fatty liver 05/07/2016   Essential hypertension, benign 03/31/2016   Proteinuria 01/30/2016   Major depression 11/23/2015   Vitamin D deficiency 11/19/2015   Pre-existing diabetes mellitus during pregnancy in second trimester 06/24/2014    Past Medical History:  Diagnosis Date   Anemia, iron deficiency, inadequate dietary intake 12/01/2014   Depression    patient has not been officially diagnosed but has symptoms   Diabetes mellitus without complication (HCC)    Essential  hypertension, benign 03/31/2016   Irregular periods/menstrual cycles    Joint pain of leg    Major depression 11/23/2015   Sciatica     OB History  Gravida Para Term Preterm AB Living  4 1   1 2 1   SAB IAB Ectopic Multiple Live Births    2     1    # Outcome Date GA Lbr Len/2nd Weight Sex Delivery Anes PTL Lv  4 Current           3 IAB 07/31/21     TAB     2 IAB 01/04/20     TAB     1 Preterm 08/06/14 5978w3d  4054 g F Vag-Spont  Y LIV     Complications: Pre-eclampsia    Past Surgical History:  Procedure Laterality Date   CHOLECYSTECTOMY N/A 05/19/2016   Procedure: LAPAROSCOPIC CHOLECYSTECTOMY WITH INTRAOPERATIVE CHOLANGIOGRAM;  Surgeon: Nadeen LandauJarvis Wilton Smith, MD;  Location: [facility] ORS;  Service: General;  Laterality: N/A;    Family History: Family History  Problem Relation Age of Onset   Diabetes Mother    Arthritis Father    Diabetes Father    Heart disease Father    Hypertension Father    Stroke Father    Lupus Sister    Deafness Sister    Diabetes Maternal Grandmother    Hypertension Maternal Grandmother    Heart disease Paternal Grandmother    Hypertension Paternal Grandmother     Social History: Social History   Tobacco Use   Smoking status: Never   Smokeless tobacco: Never  Vaping Use   Vaping Use: Never used  Substance Use Topics  Alcohol use: Not Currently    Comment: OCC   Drug use: No    Allergies:  Allergies  Allergen Reactions   Latex Rash    Pruritic, urticaria    Medications Prior to Admission  Medication Sig Dispense Refill Last Dose   aspirin EC 81 MG tablet Take 1 tablet (81 mg total) by mouth daily. Swallow whole. 90 tablet 4 07/08/2022   ferrous sulfate (FERROUSUL) 325 (65 FE) MG tablet Take 1 tablet (325 mg total) by mouth 2 (two) times daily. 60 tablet 1 07/08/2022   blood glucose meter kit and supplies KIT Dispense based on patient and insurance preference. Use daily in the am before breakfast.  For Dx DM type 2, E11.69 1 each 0     fluconazole (DIFLUCAN) 150 MG tablet Take 1 tablet (150 mg total) by mouth every 3 (three) days for 3 doses. 3 tablet 0    glucose blood (PRECISION QID TEST) test strip 1 each by Other route 2 (two) times daily.      metFORMIN (GLUCOPHAGE) 500 MG tablet Take 1 tablet (500 mg total) by mouth daily with breakfast. 30 tablet 3    prenatal vitamin w/FE, FA (NATACHEW) 29-1 MG CHEW chewable tablet Chew 2 tablets by mouth daily at 12 noon.      terconazole (TERAZOL 7) 0.4 % vaginal cream Place 1 applicator vaginally at bedtime. Use for seven days 45 g 0    UNABLE TO FIND Use as directed 1 tablet in the mouth or throat 1 day or 1 dose. Elderberry       ROS: Pertinent findings in history of present illness.  Physical Exam  Blood pressure 95/60, pulse 98, temperature 97.6 F (36.4 C), temperature source Oral, resp. rate 18, height 5\' 8"  (1.727 m), weight 109.7 kg, last menstrual period 01/29/2022, SpO2 100 %. FHR: 154 bpm CONSTITUTIONAL: Well-developed, well-nourished female in no acute distress.  NECK: Normal range of motion, supple, no masses SKIN: Skin is warm and dry. No rash noted. Not diaphoretic. No erythema. No pallor. [place]: Alert and oriented to person, place, and time. Normal reflexes, muscle tone coordination. No cranial nerve deficit noted. PSYCHIATRIC: Normal mood and affect. Normal behavior. Normal judgment and thought content. CARDIOVASCULAR: Normal heart rate noted, regular rhythm RESPIRATORY: Effort and breath sounds normal, no problems with respiration noted ABDOMEN: Soft, nontender, nondistended, gravid appropriate for gestational age MUSCULOSKELETAL: Normal range of motion. Trace, symmetric BLE edema noted, no BUE edema noted. No tenderness. 2+ distal pulses.  PELVIC EXAM: NEFG, physiologic discharge, no blood Dilation: Closed Effacement (%): Thick Exam by:: Isaiahs Chancy, MD Pelvic exam done with RN as chaperone  Labs: Results for orders placed or performed during the  hospital encounter of 07/08/22 (from the past 24 hour(s))  Urinalysis, Routine w reflex microscopic -Urine, Clean Catch     Status: Abnormal   Collection Time: 07/08/22  9:55 PM  Result Value Ref Range   Color, Urine YELLOW YELLOW   APPearance CLOUDY (A) CLEAR   Specific Gravity, Urine 1.026 1.005 - 1.030   pH 5.0 5.0 - 8.0   Glucose, UA 50 (A) NEGATIVE mg/dL   Hgb urine dipstick NEGATIVE NEGATIVE   Bilirubin Urine NEGATIVE NEGATIVE   Ketones, ur 5 (A) NEGATIVE mg/dL   Protein, ur NEGATIVE NEGATIVE mg/dL   Nitrite NEGATIVE NEGATIVE   Leukocytes,Ua SMALL (A) NEGATIVE   RBC / HPF 0-5 0 - 5 RBC/hpf   WBC, UA 11-20 0 - 5 WBC/hpf   Bacteria, UA FEW (A) NONE  SEEN   Squamous Epithelial / HPF 0-5 0 - 5 /HPF   Mucus PRESENT   Glucose, capillary     Status: Abnormal   Collection Time: 07/08/22 10:04 PM  Result Value Ref Range   Glucose-Capillary 125 (H) 70 - 99 mg/dL    Imaging:  US MFM OB DETAIL +14 WK  Result Date: 06/14/2022 ----------------------------------------------------------------------  OBSTETRICS REPORT                    (Corrected Final 06/14/2022 03:41 pm) ---------------------------------------------------------------------- Patient Info  ID #:       [id number]                          D.O.B.:  [birth year] (26 yrs)  Name:       Leighton ParodySAMANTHA B                      Visit Date: 06/14/2022 12:53 pm              [place] ---------------------------------------------------------------------- Performed By  Attending:        Noralee Spaceavi Shankar MD        Ref. Address:     [facility]  Performed By:     Burt KnackJennifer Ives RDMS     Location[facility]         [facility]                                                             [facility]  Referred By:      Hildred LaserANIKA CHERRY                    MD ---------------------------------------------------------------------- Orders  #  Description                           Code        Ordered  By  1  US MFM OB DETAIL +14 WK               [id number]    Hildred LaserANIKA CHERRY ----------------------------------------------------------------------  #  Order #                     Accession #                Episode #  1  [id number]                   [id number]                 [id number] ---------------------------------------------------------------------- Indications  Pre-existing diabetes, type 2, in pregnancy,   O24.112  second trimester (metformin)  Hypertension - Chronic/Pre-existing            O10.019  [redacted] weeks gestation of pregnancy  Z3A.19  Poor obstetric history: Previous preeclampsia  O09.299  Obesity complicating pregnancy, second         O99.212  trimester (BMI 34)  Encounter for antenatal screening for          Z36.3  malformations  NIPS LR female per patient ---------------------------------------------------------------------- Vital Signs  Weight (lb): 228                               Height:        5'8"  BMI:         34.66 ---------------------------------------------------------------------- Fetal Evaluation  Num Of Fetuses:         1  Fetal Heart Rate(bpm):  151  Cardiac Activity:       Observed  Presentation:           Breech  Placenta:               Anterior  P. Cord Insertion:      Visualized  Amniotic Fluid  AFI FV:      Within normal limits                              Largest Pocket(cm)                              2.9 ---------------------------------------------------------------------- Biometry  BPD:     40.54  mm     G. Age:  18w 2d         10  %    CI:        61.43   %    70 - 86                                                          FL/HC:      18.5   %    16.1 - 18.3  HC:    167.26   mm     G. Age:  19w 3d         41  %    HC/AC:      1.19        1.09 - 1.39  AC:    139.99   mm     G. Age:  19w 3d         44  %    FL/BPD:     76.4   %  FL:      30.97  mm     G. Age:  19w 4d         49  %    FL/AC:      22.1   %    20 - 24  HUM:      32.3  mm     G. Age:  20w 6d         89  %   CER:      19.4  mm     G. Age:  18w 6d         33  %  NFT:       5.0  mm  LV:  5.6  mm  CM:          5  mm  Est. FW:     293  gm    0 lb 10 oz      46  % ---------------------------------------------------------------------- OB History  Gravidity:    4         Prem:   1         SAB:   2  Living:       1 ---------------------------------------------------------------------- Gestational Age  LMP:           19w 3d        Date:  01/29/22                  EDD:   11/05/22  U/S Today:     19w 1d                                        EDD:   11/07/22  Best:          19w 3d     Det. By:  LMP  (01/29/22)          EDD:   11/05/22 ---------------------------------------------------------------------- Anatomy  Cranium:               Dolichocephaly         Aortic Arch:            Appears normal  Cavum:                 Appears normal         Ductal Arch:            Appears normal  Ventricles:            Appears normal         Diaphragm:              Appears normal  Choroid Plexus:        Appears normal         Stomach:                Appears normal, left                                                                        sided  Cerebellum:            Appears normal         Abdomen:                Appears normal  Posterior Fossa:       Appears normal         Abdominal Wall:         Appears nml (cord                                                                        insert,  abd wall)  Nuchal Fold:           Appears normal         Cord Vessels:           Appears normal (3                                                                        vessel cord)  Face:                  Appears normal         Kidneys:                Appear normal                         (orbits and profile)  Lips:                  Appears normal         Bladder:                Appears normal  Thoracic:              Appears normal         Spine:                  Not well visualized  Heart:                 Appears normal         Upper Extremities:       Appears normal                         (4CH, axis, and                         situs)  RVOT:                  Appears normal         Lower Extremities:      Appears normal  LVOT:                  Appears normal  Other:  Fetal sex not clearly seen. Heels/feet and open hands/5th digits          visualized. Nasal bone, lenses, maxilla, mandible and falx visualized          VC, 3VV and 3VTV visualized. ---------------------------------------------------------------------- Cervix Uterus Adnexa  Cervix  Length:            3.8  cm.  Normal appearance by transabdominal scan  Right Ovary  Size(cm)     3.23   x   3.35   x  1.26      Vol(ml): 7.14  Within normal limits.  Left Ovary  Size(cm)     4.71   x   3.29   x  1.83      Vol(ml): 14.85  Within normal limits. ---------------------------------------------------------------------- Impression  We performed fetal anatomical survey.  Amniotic fluid is  normal good fetal activity seen.  No markers of aneuploidies  or fetal structural defects are seen.  Fetal anatomical survey  appears normal.  Patient understands limitations of  ultrasound in detecting fetal anomalies.  xxxxxxxxxxxxxxxxxxxxxxxxxxxxxxxxxxxxxxxxxxxxxxxxx  Consultation  I had the pleasure of seeing Ms. Halberstam today at [facility]. She is G4 P1021 at 19w 3d gestation and  is here for fetal anatomy scan and consultation.  Patient states type 2 diabetes that was diagnosed more than  5 years ago.  Recent hemoglobin A1c was 6.5% and it was  9.2% 3 years ago.  Patient takes metformin 500 mg at night.  She reports her fasting and postprandial levels are within  normal range and that she checks her blood glucose  regularly.  She does not have neuropathy or nephropathy.  She has not  had ophthalmology examination to rule out retinopathy and  reports she will be making an appointment.  Patient has a diagnosis of chronic hypertension but reports  that her blood pressures have always been normal and she  does  not take antihypertensives.  She reports no other chronic medical conditions.  Past surgical history: Laparoscopic cholecystectomy (2017).  Medications: Prenatal vitamins, low-dose aspirin, metformin.  Allergies: Latex (rashes).  Social history: Denies tobacco or drug or alcohol use.  Her  partner is in good health.  Family history: No history of venous thromboembolism in the  family.  Obstetric history is significant for a preterm vaginal delivery at  [redacted] weeks gestation of a female infant weighing 4,054 g (8-15)  at birth.  Her daughter is in good health. Her pregnancy was  complicated by preeclampsia.  Prenatal course: On cell-free fetal DNA screening, the risks of  fetal aneuploidies are not increased.  Blood pressure today at her office is 133/81 mmHg.  Pulse  95/minute.  Pregestational diabetes  Based on her history, it seems that her blood glucose levels  are within normal range,  I explained the normal parameters of blood glucose levels.  I discussed the importance of good control of diabetes to  prevent adverse fetal and neonatal outcomes. The likelihood  of congenital malformations with a periconception  hemoglobin A1C of 6.5% is very low. However, fetal  congenital malformations are slightly increased even if  diabetes is well controlled. I discussed and recommended  fetal echocardiography.  Other complications include featl macrosomia leading  shoulder dystocia or neurological injuries at birth (Erb's  palsy). Neonatal complications including respiratory-distress  syndrome, hypoglycemia can also occur. In poorly-controlled  diabetes, the likelihood of stillbirth is increased.  I discussed management that includes diet, exercise, and  insulin. If diabetes is not well controlled with metformin alone,  insulin should be added.  I discussed ultrasound protocol. Serial fetal growth  assessments, and weekly antenatal testing beginning at 32  weeks till delivery will be performed.  Delivery is recommended at 39  weeks if diabetes is well  controlled. Early term delivery (37 to 39 weeks) should be  considered if diabetes is not well controlled.  Diabetes increases the likelihood of development of  preeclampsia. Low-dose aspirin delays or prevents  preeclampsia. ---------------------------------------------------------------------- Recommendations  -An appointment was made for her to return in 5 weeks for  completion of fetal anatomy (spine and fetal gender).  -Fetal growth assessments every 4 weeks.  -Weekly BPP from [redacted] weeks gestation till delivery.  -Delivery at [redacted] weeks gestation provided diabetes is well-  controlled.  -Encouraged the patient to make an appointment with  ophthalmologist to rule out retinopathy. ----------------------------------------------------------------------  Noralee Spaceavi Shankar, MD Electronically Signed Corrected Final Report  06/14/2022 03:41 pm ----------------------------------------------------------------------   MAU Course: Tylenol 1000 mg po given to patient for her headache and malaise She does not have anyone to drive her home, will give her Flexeril as a prescription. UA was equivocal, urine culture sent CBG checked, was 125, nighttime Metformin 500 mg po given to patient. Tamiflu 75 mg po presumptively given to her for treatment of influenza given her exposure.   Assessment: 1. Supervision of high risk pregnancy, antepartum   2. Exposure to influenza   3. Malaise   4. Pregnancy headache in second trimester   5. Pelvic pain in pregnancy, antepartum, second trimester   6. Preexisting diabetes complicating pregnancy, antepartum     Plan: Recommended Tylenol and Flexeril to help with pains Advised to take Tamiflu as prescribed, return for worsening respiratory symptoms Preterm labor precautions  reviewed Follow up with OB provider as scheduled Discharge home   Allergies as of 07/08/2022       Reactions   Latex Rash   Pruritic, urticaria         Medication List     STOP taking these medications    fluconazole 150 MG tablet Commonly known as: DIFLUCAN   terconazole 0.4 % vaginal cream Commonly known as: TERAZOL 7       TAKE these medications    aspirin EC 81 MG tablet Take 1 tablet (81 mg total) by mouth daily. Swallow whole.   blood glucose meter kit and supplies Kit Dispense based on patient and insurance preference. Use daily in the am before breakfast.  For Dx DM type 2, E11.69   cyclobenzaprine 10 MG tablet Commonly known as: FLEXERIL Take 1 tablet (10 mg total) by mouth 3 (three) times daily as needed for muscle spasms.   ferrous sulfate 325 (65 FE) MG tablet Commonly known as: FerrouSul Take 1 tablet (325 mg total) by mouth 2 (two) times daily.   metFORMIN 500 MG tablet Commonly known as: GLUCOPHAGE Take 1 tablet (500 mg total) by mouth daily with breakfast.   oseltamivir 75 MG capsule Commonly known as: TAMIFLU Take 1 capsule (75 mg total) by mouth 2 (two) times daily for 10 doses. Start taking on: July 09, 2022   Precision QID Test test strip Generic drug: glucose blood 1 each by Other route 2 (two) times daily.   prenatal vitamin w/FE, FA 29-1 MG Chew chewable tablet Chew 2 tablets by mouth daily at 12 noon.   UNABLE TO FIND Use as directed 1 tablet in the mouth or throat 1 day or 1 dose. [place] OysterElderberry        Judaea Burgoon A, MD 07/08/2022 10:50 PM

## 2022-07-08 NOTE — Progress Notes (Signed)
Virtual Visit Consent   Penny Gonzalez, you are scheduled for a virtual visit with a [facility] provider today. Just as with appointments in the office, your consent must be obtained to participate. Your consent will be active for this visit and any virtual visit you may have with one of our providers in the next 365 days. If you have a MyChart account, a copy of this consent can be sent to you electronically.  As this is a virtual visit, video technology does not allow for your provider to perform a traditional examination. This may limit your provider's ability to fully assess your condition. If your provider identifies any concerns that need to be evaluated in person or the need to arrange testing (such as labs, EKG, etc.), we will make arrangements to do so. Although advances in technology are sophisticated, we cannot ensure that it will always work on either your end or our end. If the connection with a video visit is poor, the visit may have to be switched to a telephone visit. With either a video or telephone visit, we are not always able to ensure that we have a secure connection.  By engaging in this virtual visit, you consent to the provision of healthcare and authorize for your insurance to be billed (if applicable) for the services provided during this visit. Depending on your insurance coverage, you may receive a charge related to this service.  I need to obtain your verbal consent now. Are you willing to proceed with your visit today? Penny Gonzalez has provided verbal consent on 07/08/2022 for a virtual visit (video or telephone). Mar Daring, PA-C  Date: 07/08/2022 7:16 PM  Virtual Visit via Video Note   I, Mar Daring, connected with  Penny Gonzalez  ([id number], 1996-06-11) on 07/08/22 at  7:00 PM EST by a video-enabled telemedicine application and verified that I am speaking with the correct person using two identifiers.  Location: Patient: Virtual  Visit Location Patient: Home Provider: Virtual Visit Location Provider: Home Office   I discussed the limitations of evaluation and management by telemedicine and the availability of in person appointments. The patient expressed understanding and agreed to proceed.    History of Present Illness: Penny Gonzalez is a 27 y.o. who identifies as a female who was assigned female at birth, and is being seen today for edema. Reports started 3 days ago. Having mostly in bilateral lower extremities and in face mildly. Denies any shortness of breath or difficulty breathing. She does admit to missing a couple doses of her metformin and unsure if that triggered this. Last labs were in Dec 2023 and were fairly okay. She is [redacted]w[redacted]d.   Problems:  Patient Active Problem List   Diagnosis Date Noted   Vaginal discharge during pregnancy in second trimester 05/11/2022   History of preterm delivery, currently pregnant 05/11/2022   History of pre-eclampsia in prior pregnancy, currently pregnant 05/11/2022   Supervision of high risk pregnancy, antepartum 04/28/2022   Recurrent major depressive disorder, in partial remission ([place]) 12/12/2018   Morbid obesity ([place]) 11/29/2017   Acanthosis nigricans 06/27/2017   Fatty liver 05/07/2016   Essential hypertension, benign 03/31/2016   Proteinuria 01/30/2016   Major depression 11/23/2015   Vitamin D deficiency 11/19/2015   Pre-existing diabetes mellitus during pregnancy in second trimester 06/24/2014    Allergies:  Allergies  Allergen Reactions   Latex Rash    Pruritic, urticaria   Medications:  Current Outpatient Medications:  aspirin EC 81 MG tablet, Take 1 tablet (81 mg total) by mouth daily. Swallow whole., Disp: 90 tablet, Rfl: 4   blood glucose meter kit and supplies KIT, Dispense based on patient and insurance preference. Use daily in the am before breakfast.  For Dx DM type 2, E11.69, Disp: 1 each, Rfl: 0   ferrous sulfate (FERROUSUL) 325 (65 FE) MG  tablet, Take 1 tablet (325 mg total) by mouth 2 (two) times daily., Disp: 60 tablet, Rfl: 1   fluconazole (DIFLUCAN) 150 MG tablet, Take 1 tablet (150 mg total) by mouth every 3 (three) days for 3 doses., Disp: 3 tablet, Rfl: 0   glucose blood (PRECISION QID TEST) test strip, 1 each by Other route 2 (two) times daily., Disp: , Rfl:    metFORMIN (GLUCOPHAGE) 500 MG tablet, Take 1 tablet (500 mg total) by mouth daily with breakfast., Disp: 30 tablet, Rfl: 3   prenatal vitamin w/FE, FA (NATACHEW) 29-1 MG CHEW chewable tablet, Chew 2 tablets by mouth daily at 12 noon., Disp: , Rfl:    terconazole (TERAZOL 7) 0.4 % vaginal cream, Place 1 applicator vaginally at bedtime. Use for seven days, Disp: 45 g, Rfl: 0   UNABLE TO FIND, Use as directed 1 tablet in the mouth or throat 1 day or 1 dose. Elderberry, Disp: , Rfl:   Observations/Objective: Patient is well-developed, well-nourished in no acute distress.  Resting comfortably at home.  Head is normocephalic, atraumatic.  No labored breathing.  Speech is clear and coherent with logical content.  Patient is alert and oriented at baseline.    Assessment and Plan: 1. Gestational edema in second trimester  - Advised patient to elevate legs, limit salt in diet - Call OB/GYN tonight for after hours line for further advise; she voices understanding and agrees  Follow Up Instructions: I discussed the assessment and treatment plan with the patient. The patient was provided an opportunity to ask questions and all were answered. The patient agreed with the plan and demonstrated an understanding of the instructions.  A copy of instructions were sent to the patient via MyChart unless otherwise noted below.    The patient was advised to call back or seek an in-person evaluation if the symptoms worsen or if the condition fails to improve as anticipated.  Time:  I spent 15 minutes with the patient via telehealth technology discussing the above  problems/concerns.    Mar Daring, PA-C

## 2022-07-08 NOTE — Patient Instructions (Signed)
Dorthula Perfect, thank you for joining Mar Daring, PA-C for today's virtual visit.  While this provider is not your primary care provider (PCP), if your PCP is located in our provider database this encounter information will be shared with them immediately following your visit.   [facility] account gives you access to today's visit and all your visits, tests, and labs performed at [facility] " click here if you don't have a [facility] account or go to [email or web address]  Consent: (Patient) Penny Gonzalez provided verbal consent for this virtual visit at the beginning of the encounter.  Current Medications:  Current Outpatient Medications:    aspirin EC 81 MG tablet, Take 1 tablet (81 mg total) by mouth daily. Swallow whole., Disp: 90 tablet, Rfl: 4   blood glucose meter kit and supplies KIT, Dispense based on patient and insurance preference. Use daily in the am before breakfast.  For Dx DM type 2, E11.69, Disp: 1 each, Rfl: 0   ferrous sulfate (FERROUSUL) 325 (65 FE) MG tablet, Take 1 tablet (325 mg total) by mouth 2 (two) times daily., Disp: 60 tablet, Rfl: 1   fluconazole (DIFLUCAN) 150 MG tablet, Take 1 tablet (150 mg total) by mouth every 3 (three) days for 3 doses., Disp: 3 tablet, Rfl: 0   glucose blood (PRECISION QID TEST) test strip, 1 each by Other route 2 (two) times daily., Disp: , Rfl:    metFORMIN (GLUCOPHAGE) 500 MG tablet, Take 1 tablet (500 mg total) by mouth daily with breakfast., Disp: 30 tablet, Rfl: 3   prenatal vitamin w/FE, FA (NATACHEW) 29-1 MG CHEW chewable tablet, Chew 2 tablets by mouth daily at 12 noon., Disp: , Rfl:    terconazole (TERAZOL 7) 0.4 % vaginal cream, Place 1 applicator vaginally at bedtime. Use for seven days, Disp: 45 g, Rfl: 0   UNABLE TO FIND, Use as directed 1 tablet in the mouth or throat 1 day or 1 dose. Elderberry, Disp: , Rfl:    Medications ordered in this encounter:  No orders of the  defined types were placed in this encounter.    *If you need refills on other medications prior to your next appointment, please contact your pharmacy*  Follow-Up: Call back or seek an in-person evaluation if the symptoms worsen or if the condition fails to improve as anticipated.  [place] [phone]  Other Instructions Warning Signs During Pregnancy Reviews the signs and symptoms of some of the problems that may occur during pregnancy so you know what to watch for and when to contact your health care team.  To view the content, go to this web address: [email or web address]  This video will expire on: 02/16/2024. If you need access to this video following this date, please reach out to the healthcare provider who assigned it to you. This information is not intended to replace advice given to you by your health care provider. Make sure you discuss any questions you have with your health care provider. Elsevier Patient Education  [place].    If you have been instructed to have an in-person evaluation today at a local Urgent Care facility, please use the link below. It will take you to a list of all of our available [facility], including address, phone number and hours of operation. Please do not delay care.  [facility]  If you or a family member do not have a primary care provider, use  the link below to schedule a visit and establish care. When you choose a [facility] primary care physician or advanced practice provider, you gain a long-term partner in health. Find a Primary Care Provider  Learn more about 's in-office and virtual care options: [facility]

## 2022-07-08 NOTE — MAU Note (Addendum)
Pt says she has abd pain -  x2 weeks  Feels pressure in vagina  [facility]- [facility]- has not told them Has had sciatic nerve pain - x1 mth - preg band and Tyle . Has not taken any Tylenol Concern tonight  is swelling x2 days - feet, hands  - she called on call - told to come here.  Now says head hurts - started at 5pm- no meds  7/10

## 2022-07-10 LAB — CULTURE, OB URINE: Culture: <10000 — AB

## 2022-07-11 ENCOUNTER — Telehealth: Payer: Self-pay

## 2022-07-11 NOTE — Telephone Encounter (Signed)
Pt calling; is preg; not feeling good at all; sxs started today of sore throat, dry cough and H/A Her PCP doesn't want to test her b/c she is preg and told her to call us.  [phone] Adv pt we do not have tests her for covid, flu, or RSV b/c we are not a primary care; pt states she usually can get tested at her primary and thinks they just didn't want to bother; adv UC, HD, or ER; adv will send safe meds list via her mychart.

## 2022-07-13 ENCOUNTER — Other Ambulatory Visit: Payer: Self-pay

## 2022-07-13 DIAGNOSIS — O24112 Pre-existing diabetes mellitus, type 2, in pregnancy, second trimester: Secondary | ICD-10-CM

## 2022-07-13 DIAGNOSIS — O09299 Supervision of pregnancy with other poor reproductive or obstetric history, unspecified trimester: Secondary | ICD-10-CM

## 2022-07-13 DIAGNOSIS — O09899 Supervision of other high risk pregnancies, unspecified trimester: Secondary | ICD-10-CM

## 2022-07-14 ENCOUNTER — Ambulatory Visit (HOSPITAL_BASED_OUTPATIENT_CLINIC_OR_DEPARTMENT_OTHER): Payer: BC Managed Care – PPO

## 2022-07-14 ENCOUNTER — Emergency Department
Admission: EM | Admit: 2022-07-14 | Discharge: 2022-07-14 | Disposition: A | Discharge status: Home / Self Care | Payer: BC Managed Care – PPO | Attending: Emergency Medicine | Admitting: Emergency Medicine

## 2022-07-14 ENCOUNTER — Other Ambulatory Visit: Payer: Self-pay

## 2022-07-14 VITALS — BP 126/74 | HR 112 | Temp 98.6°F | Ht 68.0 in | Wt 240.5 lb

## 2022-07-14 DIAGNOSIS — O24112 Pre-existing diabetes mellitus, type 2, in pregnancy, second trimester: Secondary | ICD-10-CM | POA: Insufficient documentation

## 2022-07-14 DIAGNOSIS — Z3A23 23 weeks gestation of pregnancy: Secondary | ICD-10-CM | POA: Insufficient documentation

## 2022-07-14 DIAGNOSIS — O10912 Unspecified pre-existing hypertension complicating pregnancy, second trimester: Secondary | ICD-10-CM | POA: Insufficient documentation

## 2022-07-14 DIAGNOSIS — O09292 Supervision of pregnancy with other poor reproductive or obstetric history, second trimester: Secondary | ICD-10-CM | POA: Insufficient documentation

## 2022-07-14 DIAGNOSIS — O26899 Other specified pregnancy related conditions, unspecified trimester: Secondary | ICD-10-CM | POA: Diagnosis not present

## 2022-07-14 DIAGNOSIS — O162 Unspecified maternal hypertension, second trimester: Secondary | ICD-10-CM | POA: Insufficient documentation

## 2022-07-14 DIAGNOSIS — O99212 Obesity complicating pregnancy, second trimester: Secondary | ICD-10-CM | POA: Insufficient documentation

## 2022-07-14 DIAGNOSIS — O0990 Supervision of high risk pregnancy, unspecified, unspecified trimester: Secondary | ICD-10-CM

## 2022-07-14 DIAGNOSIS — R8271 Bacteriuria: Secondary | ICD-10-CM | POA: Insufficient documentation

## 2022-07-14 DIAGNOSIS — H109 Unspecified conjunctivitis: Secondary | ICD-10-CM | POA: Insufficient documentation

## 2022-07-14 DIAGNOSIS — Z1152 Encounter for screening for COVID-19: Secondary | ICD-10-CM | POA: Diagnosis not present

## 2022-07-14 DIAGNOSIS — Z362 Encounter for other antenatal screening follow-up: Secondary | ICD-10-CM

## 2022-07-14 DIAGNOSIS — Z7984 Long term (current) use of oral hypoglycemic drugs: Secondary | ICD-10-CM

## 2022-07-14 DIAGNOSIS — H1031 Unspecified acute conjunctivitis, right eye: Secondary | ICD-10-CM

## 2022-07-14 DIAGNOSIS — O10012 Pre-existing essential hypertension complicating pregnancy, second trimester: Secondary | ICD-10-CM | POA: Diagnosis not present

## 2022-07-14 DIAGNOSIS — O09299 Supervision of pregnancy with other poor reproductive or obstetric history, unspecified trimester: Secondary | ICD-10-CM

## 2022-07-14 DIAGNOSIS — O09899 Supervision of other high risk pregnancies, unspecified trimester: Secondary | ICD-10-CM

## 2022-07-14 DIAGNOSIS — E119 Type 2 diabetes mellitus without complications: Secondary | ICD-10-CM | POA: Insufficient documentation

## 2022-07-14 DIAGNOSIS — O24912 Unspecified diabetes mellitus in pregnancy, second trimester: Secondary | ICD-10-CM | POA: Insufficient documentation

## 2022-07-14 DIAGNOSIS — O2392 Unspecified genitourinary tract infection in pregnancy, second trimester: Secondary | ICD-10-CM | POA: Diagnosis not present

## 2022-07-14 DIAGNOSIS — O99891 Other specified diseases and conditions complicating pregnancy: Secondary | ICD-10-CM

## 2022-07-14 DIAGNOSIS — O26892 Other specified pregnancy related conditions, second trimester: Secondary | ICD-10-CM | POA: Diagnosis present

## 2022-07-14 LAB — RESP PANEL BY RT-PCR (RSV, FLU A&B, COVID)  RVPGX2
Influenza A by PCR: NEGATIVE
Influenza B by PCR: NEGATIVE
Resp Syncytial Virus by PCR: NEGATIVE
SARS Coronavirus 2 by RT PCR: NEGATIVE

## 2022-07-14 LAB — COMPREHENSIVE METABOLIC PANEL
ALT: 24 U/L (ref 0–44)
AST: 25 U/L (ref 15–41)
Albumin: 3 g/dL — ABNORMAL LOW (ref 3.5–5.0)
Alkaline Phosphatase: 54 U/L (ref 38–126)
Anion gap: 8 (ref 5–15)
BUN: <5 mg/dL — ABNORMAL LOW (ref 6–20)
CO2: 21 mmol/L — ABNORMAL LOW (ref 22–32)
Calcium: 8.3 mg/dL — ABNORMAL LOW (ref 8.9–10.3)
Chloride: 106 mmol/L (ref 98–111)
Creatinine, Ser: 0.39 mg/dL — ABNORMAL LOW (ref 0.44–1.00)
GFR, Estimated: >60 mL/min (ref >60)
Glucose, Bld: 137 mg/dL — ABNORMAL HIGH (ref 70–99)
Potassium: 3.6 mmol/L (ref 3.5–5.1)
Sodium: 135 mmol/L (ref 135–145)
Total Bilirubin: 0.4 mg/dL (ref 0.3–1.2)
Total Protein: 6.1 g/dL — ABNORMAL LOW (ref 6.5–8.1)

## 2022-07-14 LAB — CBC WITH DIFFERENTIAL/PLATELET
Abs Immature Granulocytes: 0.1 10*3/uL — ABNORMAL HIGH (ref 0.00–0.07)
Basophils Absolute: 0 10*3/uL (ref 0.0–0.1)
Basophils Relative: 0 %
Eosinophils Absolute: 0.1 10*3/uL (ref 0.0–0.5)
Eosinophils Relative: 1 %
HCT: 31.5 % — ABNORMAL LOW (ref 36.0–46.0)
Hemoglobin: 10.4 g/dL — ABNORMAL LOW (ref 12.0–15.0)
Immature Granulocytes: 1 %
Lymphocytes Relative: 20 %
Lymphs Abs: 1.8 10*3/uL (ref 0.7–4.0)
MCH: 28.6 pg (ref 26.0–34.0)
MCHC: 33 g/dL (ref 30.0–36.0)
MCV: 86.5 fL (ref 80.0–100.0)
Monocytes Absolute: 0.7 10*3/uL (ref 0.1–1.0)
Monocytes Relative: 8 %
Neutro Abs: 6.2 10*3/uL (ref 1.7–7.7)
Neutrophils Relative %: 70 %
Platelets: 193 10*3/uL (ref 150–400)
RBC: 3.64 MIL/uL — ABNORMAL LOW (ref 3.87–5.11)
RDW: 15.4 % (ref 11.5–15.5)
WBC: 9 10*3/uL (ref 4.0–10.5)
nRBC: 0 % (ref 0.0–0.2)

## 2022-07-14 LAB — URINALYSIS, ROUTINE W REFLEX MICROSCOPIC
Bilirubin Urine: NEGATIVE
Glucose, UA: >=500 mg/dL — AB
Hgb urine dipstick: NEGATIVE
Ketones, ur: NEGATIVE mg/dL
Leukocytes,Ua: NEGATIVE
Nitrite: NEGATIVE
Protein, ur: NEGATIVE mg/dL
Specific Gravity, Urine: 1.023 (ref 1.005–1.030)
pH: 6 (ref 5.0–8.0)

## 2022-07-14 LAB — GROUP A STREP BY PCR: Group A Strep by PCR: NOT DETECTED

## 2022-07-14 MED ORDER — SODIUM CHLORIDE 0.9 % IV BOLUS
1000.0000 mL | Freq: Once | INTRAVENOUS | Status: AC
  Administered 2022-07-14: 1000 mL via INTRAVENOUS

## 2022-07-14 MED ORDER — ERYTHROMYCIN 5 MG/GM OP OINT: 1.0000 | TOPICAL_OINTMENT | Freq: Every day | OPHTHALMIC | 0 refills | Status: DC

## 2022-07-14 MED ORDER — ACETAMINOPHEN 325 MG PO TABS
650.0000 mg | ORAL_TABLET | Freq: Once | ORAL | Status: AC
  Administered 2022-07-14: 650 mg via ORAL
  Filled 2022-07-14: qty 2

## 2022-07-14 MED ORDER — CEPHALEXIN 500 MG PO CAPS: 500.0000 mg | ORAL_CAPSULE | Freq: Four times a day (QID) | ORAL | 0 refills | Status: AC

## 2022-07-14 MED ORDER — CEPHALEXIN 500 MG PO CAPS: 500.0000 mg | ORAL_CAPSULE | Freq: Four times a day (QID) | ORAL | 0 refills | Status: DC

## 2022-07-14 NOTE — ED Notes (Signed)
Pt verbalizes understanding of discharge instructions. Opportunity for questioning and answers were provided. Pt discharged from ED to home with family.    

## 2022-07-14 NOTE — ED Provider Notes (Signed)
[facility] Provider Note    Event Date/Time   First MD Initiated Contact with Patient 07/14/22 1746     (approximate)   History   Flu-like symptoms    HPI  Penny Gonzalez is a 27 y.o. female G4 P0-1-2-1 at [redacted] weeks pregnant, with known history of type 2 diabetes who presents today for evaluation of body aches.  She reports that her daughter was diagnosed with influenza on 07/08/2022.  She reports that she went to a hospital in [place] and they treated her prophylactically with Tamiflu, but patient reports that she did not have symptoms at that time.  She reports that she has subsequently developed headache, body aches, cough, nasal congestion, sore throat, and sneezing that began on Tuesday.  She reports that she took her blood sugar and noticed that it was elevated.  Patient denies any pregnancy concerns today.  Patient Active Problem List   Diagnosis Date Noted   Vaginal discharge during pregnancy in second trimester 05/11/2022   History of preterm delivery, currently pregnant 05/11/2022   History of pre-eclampsia in prior pregnancy, currently pregnant 05/11/2022   Supervision of high risk pregnancy, antepartum 04/28/2022   Recurrent major depressive disorder, in partial remission ([place]) 12/12/2018   Morbid obesity ([place]) 11/29/2017   Acanthosis nigricans 06/27/2017   Fatty liver 05/07/2016   Essential hypertension, benign 03/31/2016   Proteinuria 01/30/2016   Major depression 11/23/2015   Vitamin D deficiency 11/19/2015   Pre-existing diabetes mellitus during pregnancy in second trimester 06/24/2014          Physical Exam   Triage Vital Signs: ED Triage Vitals [07/14/22 1716]  Enc Vitals Group     BP      Pulse      Resp      Temp      Temp src      SpO2      Weight      Height      Head Circumference      Peak Flow      Pain Score 0     Pain Loc      Pain Edu?      Excl. in [place]?     Most recent vital signs: Vitals:    07/14/22 2000 07/14/22 2106  BP:  110/75  Pulse: 98 90  Resp: 19 18  Temp:    SpO2: 100% 98%    Physical Exam Vitals and nursing note reviewed.  Constitutional:      General: Awake and alert. No acute distress.    Appearance: Normal appearance. The patient is obese.  HENT:     Head: Normocephalic and atraumatic.     Mouth: Mucous membranes are moist.  Eyes:     General: PERRL. Normal EOMs        Right eye: Scleral injection with mucopurulent drainage in the corners.  No hyphema or hypopyon.  No periorbital erythema or edema    Left eye: No discharge.     Conjunctiva/sclera: Conjunctivae normal.  Cardiovascular:     Rate and Rhythm: Normal rate and regular rhythm.     Pulses: Normal pulses.  Pulmonary:     Effort: Pulmonary effort is normal. No respiratory distress.     Breath sounds: Normal breath sounds.  Abdominal:     Abdomen is soft. There is no abdominal tenderness. No rebound or guarding. No distention. Musculoskeletal:        General: No swelling. Normal range of motion.  Cervical back: Normal range of motion and neck supple.  Skin:    General: Skin is warm and dry.     Capillary Refill: Capillary refill takes less than 2 seconds.     Findings: No rash.  Neurological:     Mental Status: The patient is awake and alert.      ED Results / Procedures / Treatments   Labs (all labs ordered are listed, but only abnormal results are displayed) Labs Reviewed  CBC WITH DIFFERENTIAL/PLATELET - Abnormal; Notable for the following components:      Result Value   RBC 3.64 (*)    Hemoglobin 10.4 (*)    HCT 31.5 (*)    Abs Immature Granulocytes 0.10 (*)    All other components within normal limits  COMPREHENSIVE METABOLIC PANEL - Abnormal; Notable for the following components:   CO2 21 (*)    Glucose, Bld 137 (*)    BUN <5 (*)    Creatinine, Ser 0.39 (*)    Calcium 8.3 (*)    Total Protein 6.1 (*)    Albumin 3.0 (*)    All other components within normal limits   URINALYSIS, ROUTINE W REFLEX MICROSCOPIC - Abnormal; Notable for the following components:   Color, Urine YELLOW (*)    APPearance CLOUDY (*)    Glucose, UA >=500 (*)    Bacteria, UA MANY (*)    All other components within normal limits  RESP PANEL BY RT-PCR (RSV, FLU A&B, COVID)  [id number]  GROUP A STREP BY PCR     EKG     RADIOLOGY     PROCEDURES:  Critical Care performed:   Procedures   MEDICATIONS ORDERED IN ED: Medications  sodium chloride 0.9 % bolus 1,000 mL (0 mLs Intravenous Stopped 07/14/22 1954)  acetaminophen (TYLENOL) tablet 650 mg (650 mg Oral Given 07/14/22 1958)     IMPRESSION / MDM / ASSESSMENT AND PLAN / ED COURSE  I reviewed the triage vital signs and the nursing notes.   Differential diagnosis includes, but is not limited to, influenza, COVID-19, RSV, bronchitis, hyperglycemia,  I reviewed the patient's chart.  Patient went to her OB/GYN on 07/08/2022 with similar complaints and was given Tamiflu and instructed to take Tylenol/flexeril.  Patient is awake and alert, hemodynamically stable and afebrile.  Left obtained in triage are overall reassuring, though she has hyperglycemia.  She was treated with IV fluids.  There is no anion gap, normal bicarb, do not suspect DKA.  Her LFTs are normal, not consistent with HELLP.  Normal blood pressure, no proteinuria, not consistent with preeclampsia.  Her COVID/flu/RSV was negative.  She does seem to have a mild bacterial conjunctivitis which will be treated with erythromycin ointment.  She was also found to have bacteria in her urine, which was treated with Keflex given that she is pregnant.  Patient was reassured by these findings.  Discussed return precautions and the importance of close outpatient follow-up.  Patient understands and agrees with plan.  She was discharged in stable condition.   Patient's presentation is most consistent with acute complicated illness / injury requiring diagnostic workup.    FINAL  CLINICAL IMPRESSION(S) / ED DIAGNOSES   Final diagnoses:  Bacteriuria in pregnancy  Acute bacterial conjunctivitis of right eye     Rx / DC Orders   ED Discharge Orders          Ordered    cephALEXin (KEFLEX) 500 MG capsule  4 times daily,   Status:  Discontinued  07/14/22 2056    erythromycin ophthalmic ointment  Daily at bedtime,   Status:  Discontinued        07/14/22 2056    cephALEXin (KEFLEX) 500 MG capsule  4 times daily        07/14/22 2123    erythromycin ophthalmic ointment  Daily at bedtime        07/14/22 2123             Note:  This document was prepared using Dragon voice recognition software and may include unintentional dictation errors.   Emeline Gins 07/14/22 2319    Lavonia Drafts, MD 07/14/22 [phone]

## 2022-07-14 NOTE — Discharge Instructions (Addendum)
Please take the antibiotics for your bacteria in your urine.  You may also use the topical antibiotic for your pinkeye.  Please return for any new, worsening, or change in symptoms or other concerns.  Please follow-up with your outpatient provider.  It was a pleasure caring for you today.

## 2022-07-14 NOTE — ED Triage Notes (Signed)
Pt was seen at [facility] and sent in for flu-like symptoms. Pt has no pregnancy complaints. Pt is [redacted]wks pregnant.

## 2022-07-14 NOTE — Telephone Encounter (Signed)
Patient reports she was seen at [facility] ED Saturday treated w/Tamiflu which she started on Sunday. She was also treated at [facility] on Tuesday with amoxicillin in the event that her illness was viral. She is feeling weak and inquiring if she should go back to hospital. She is not taking any over the counter medications to treat her symptoms (head/nasal congestion) Patient thinks she is taking too much medicine. Reviewed OTC meds with patient and sources of hydration that will help (gatorade/pedialyte) Advised of reasons she would need to report to ED- unable to keep food/fluid down for 12 hours. Patient has appointment w/[facility] at 4pm today. She will notify them if she is not feeling well enough to keep appointment.

## 2022-07-19 ENCOUNTER — Encounter: Payer: Self-pay | Admitting: Obstetrics and Gynecology

## 2022-07-19 ENCOUNTER — Ambulatory Visit (INDEPENDENT_AMBULATORY_CARE_PROVIDER_SITE_OTHER): Payer: BC Managed Care – PPO | Admitting: Obstetrics and Gynecology

## 2022-07-19 VITALS — BP 138/87 | HR 105 | Wt 244.4 lb

## 2022-07-19 DIAGNOSIS — O24312 Unspecified pre-existing diabetes mellitus in pregnancy, second trimester: Secondary | ICD-10-CM

## 2022-07-19 DIAGNOSIS — O09299 Supervision of pregnancy with other poor reproductive or obstetric history, unspecified trimester: Secondary | ICD-10-CM

## 2022-07-19 DIAGNOSIS — O09292 Supervision of pregnancy with other poor reproductive or obstetric history, second trimester: Secondary | ICD-10-CM

## 2022-07-19 DIAGNOSIS — O09892 Supervision of other high risk pregnancies, second trimester: Secondary | ICD-10-CM

## 2022-07-19 DIAGNOSIS — O0992 Supervision of high risk pregnancy, unspecified, second trimester: Secondary | ICD-10-CM

## 2022-07-19 DIAGNOSIS — O09899 Supervision of other high risk pregnancies, unspecified trimester: Secondary | ICD-10-CM

## 2022-07-19 DIAGNOSIS — O0990 Supervision of high risk pregnancy, unspecified, unspecified trimester: Secondary | ICD-10-CM

## 2022-07-19 DIAGNOSIS — Z3A24 24 weeks gestation of pregnancy: Secondary | ICD-10-CM | POA: Diagnosis not present

## 2022-07-19 MED ORDER — ACCU-CHEK GUIDE W/DEVICE KIT: PACK | 0 refills | Status: DC

## 2022-07-19 MED ORDER — ACCU-CHEK GUIDE VI STRP: ORAL_STRIP | 12 refills | Status: DC

## 2022-07-19 MED ORDER — ACCU-CHEK SOFTCLIX LANCETS MISC: 12 refills | Status: DC

## 2022-07-19 NOTE — Patient Instructions (Signed)
Increase metformin to 500 mg po BID

## 2022-07-19 NOTE — Progress Notes (Signed)
NOB present for prenatal care. Pt endorses fetal movement. Pt needs new glucometer. Pt reports lower abdominal pain, leg and lower back pain.

## 2022-07-19 NOTE — Progress Notes (Signed)
   PRENATAL VISIT NOTE  Subjective:  Penny Gonzalez is a 27 y.o. [phone] at [redacted]w[redacted]d being seen today for ongoing prenatal care.  She is currently monitored for the following issues for this high-risk pregnancy and has Pre-existing diabetes mellitus during pregnancy in second trimester; Vitamin D deficiency; Major depression; Proteinuria; Essential hypertension, benign; Fatty liver; Acanthosis nigricans; Morbid obesity ([place]); Recurrent major depressive disorder, in partial remission ([place]); Supervision of high risk pregnancy, antepartum; Vaginal discharge during pregnancy in second trimester; History of preterm delivery, currently pregnant; and History of pre-eclampsia in prior pregnancy, currently pregnant on their problem list.  Patient doing well with no acute concerns today. She reports  lower leg and abdomen discomfort with movement .  Contractions: Not present. Vag. Bleeding: None.  Movement: Present. Denies leaking of fluid.   The following portions of the patient's history were reviewed and updated as appropriate: allergies, current medications, past family history, past medical history, past social history, past surgical history and problem list. Problem list updated.  Objective:   Vitals:   07/19/22 1516  BP: 138/87  Pulse: (!) 105  Weight: 244 lb 6.4 oz (110.9 kg)    Fetal Status: Fetal Heart Rate (bpm): 141 (Simultaneous filing. User may not have seen previous data.) Fundal Height: 26 cm Movement: Present     General:  Alert, oriented and cooperative. Patient is in no acute distress.  Skin: Skin is warm and dry. No rash noted.   Cardiovascular: Normal heart rate noted  Respiratory: Normal respiratory effort, no problems with respiration noted  Abdomen: Soft, gravid, appropriate for gestational age.  Pain/Pressure: Present     Pelvic: Cervical exam deferred        Extremities: Normal range of motion.  Edema: Trace  Mental Status:  Normal mood and affect. Normal behavior.  Normal judgment and thought content.   Assessment and Plan:  Pregnancy: [id number] at [redacted]w[redacted]d  1. Supervision of high risk pregnancy, antepartum Continue routine prenatal care  2. [redacted] weeks gestation of pregnancy   3. Pre-existing diabetes mellitus during pregnancy in second trimester Discussed most blood sugars are out of range.  Currently pt has metformin 500 mg that she takes once at night. FBS: 128-130 PPBS: 107-223  Metformin increased to BID Pt advised she may need insulin therapy for better blood sugar control. Will recheck in 2 weeks  - Blood Glucose Monitoring Suppl (ACCU-CHEK GUIDE) w/Device KIT; Use glucose meter to check blood sugar 4 times daily.  Dispense: 1 kit; Refill: 0 - glucose blood (ACCU-CHEK GUIDE) test strip; Use 1 strip to check blood sugar 4 times daily  Dispense: 100 each; Refill: 12 - Accu-Chek Softclix Lancets lancets; Use to check blood sugar 4 times daily.  Dispense: 100 each; Refill: 12  4. History of preterm delivery, currently pregnant No s/sx of preterm labor Last delivery at 27 weeks 2/2 preeclampsia  5. History of pre-eclampsia in prior pregnancy, currently pregnant Pt compliant with baby ASA  Preterm labor symptoms and general obstetric precautions including but not limited to vaginal bleeding, contractions, leaking of fluid and fetal movement were reviewed in detail with the patient.  Please refer to After Visit Summary for other counseling recommendations.   Return in about 2 weeks (around 08/02/2022) for [place] Day, in person.   Lynnda Shields, MD Faculty Attending Center for [facility]

## 2022-07-20 ENCOUNTER — Other Ambulatory Visit: Payer: Self-pay | Admitting: Advanced Practice Midwife

## 2022-07-20 ENCOUNTER — Telehealth: Payer: Self-pay

## 2022-07-20 NOTE — Telephone Encounter (Signed)
Patient contacted office requesting for a new glucometer. Meter was last sent in to pharmacy in 2020 patient states that hers is not working properly and would like new meter sent to [facility] on [address]. Okay to send to pharmacy?

## 2022-07-20 NOTE — Telephone Encounter (Signed)
Contacted patient and left message advising her of this. KW

## 2022-07-25 ENCOUNTER — Telehealth: Payer: Self-pay

## 2022-07-25 NOTE — Telephone Encounter (Signed)
Patient contacted office stating that she dropped off FMLA paper work last week, she will be faxing over today a cover letter to send back to HR once FMLA paperwork is completed. KW

## 2022-07-27 ENCOUNTER — Encounter: Payer: Self-pay | Admitting: Obstetrics and Gynecology

## 2022-07-27 ENCOUNTER — Ambulatory Visit (INDEPENDENT_AMBULATORY_CARE_PROVIDER_SITE_OTHER): Payer: BC Managed Care – PPO | Admitting: Obstetrics and Gynecology

## 2022-07-27 VITALS — BP 129/80 | HR 111 | Wt 246.4 lb

## 2022-07-27 DIAGNOSIS — R053 Chronic cough: Secondary | ICD-10-CM

## 2022-07-27 DIAGNOSIS — E119 Type 2 diabetes mellitus without complications: Secondary | ICD-10-CM

## 2022-07-27 DIAGNOSIS — O99210 Obesity complicating pregnancy, unspecified trimester: Secondary | ICD-10-CM

## 2022-07-27 DIAGNOSIS — O24319 Unspecified pre-existing diabetes mellitus in pregnancy, unspecified trimester: Secondary | ICD-10-CM

## 2022-07-27 DIAGNOSIS — O09899 Supervision of other high risk pregnancies, unspecified trimester: Secondary | ICD-10-CM

## 2022-07-27 DIAGNOSIS — Z1331 Encounter for screening for depression: Secondary | ICD-10-CM

## 2022-07-27 DIAGNOSIS — O09299 Supervision of pregnancy with other poor reproductive or obstetric history, unspecified trimester: Secondary | ICD-10-CM

## 2022-07-27 DIAGNOSIS — Z7984 Long term (current) use of oral hypoglycemic drugs: Secondary | ICD-10-CM

## 2022-07-27 DIAGNOSIS — Z3009 Encounter for other general counseling and advice on contraception: Secondary | ICD-10-CM | POA: Insufficient documentation

## 2022-07-27 DIAGNOSIS — I10 Essential (primary) hypertension: Secondary | ICD-10-CM

## 2022-07-27 DIAGNOSIS — O0990 Supervision of high risk pregnancy, unspecified, unspecified trimester: Secondary | ICD-10-CM

## 2022-07-27 DIAGNOSIS — O24312 Unspecified pre-existing diabetes mellitus in pregnancy, second trimester: Secondary | ICD-10-CM

## 2022-07-27 DIAGNOSIS — Z3A25 25 weeks gestation of pregnancy: Secondary | ICD-10-CM

## 2022-07-27 HISTORY — DX: Unspecified pre-existing diabetes mellitus in pregnancy, unspecified trimester: O24.319

## 2022-07-27 LAB — POCT URINALYSIS DIPSTICK OB
Bilirubin, UA: NEGATIVE
Blood, UA: NEGATIVE
Glucose, UA: NEGATIVE
Ketones, UA: NEGATIVE
Leukocytes, UA: NEGATIVE
Nitrite, UA: NEGATIVE
Spec Grav, UA: >=1.03 — AB (ref 1.010–1.025)
Urobilinogen, UA: 0.2 E.U./dL
pH, UA: 5 (ref 5.0–8.0)

## 2022-07-27 MED ORDER — BENZONATATE 100 MG PO CAPS: 100.0000 mg | ORAL_CAPSULE | Freq: Three times a day (TID) | ORAL | 0 refills | Status: DC | PRN

## 2022-07-27 MED ORDER — GLYBURIDE 5 MG PO TABS: 5.0000 mg | ORAL_TABLET | Freq: Two times a day (BID) | ORAL | 4 refills | Status: DC

## 2022-07-27 NOTE — Progress Notes (Signed)
ROB: Patient is a 27 y.o. [id number] at 33w4dwho presents for routine care.  Patient is resuming care here, [facility] [place]  [place]ially was planning to [place]  However patient notes that she did not enjoy her experience and would [place].    Reports that she has had some issues with her blood sugars lately, was sick for approximately 2 weeks recently and was not eating much, however was trying to stay hydrated.  Notes that some of the fluid she was consuming may have had some additional sugar and increased her blood sugars for short time.  Was advised at her last visit that she should likely be converted to insulin, currently on metformin.,  However patient notes that she is currently on a low-dose of metformin and would like to try to optimize on oral medications before transitioning to insulin.  Reviewed blood sugars, discussed that patient may do better on glyburide instead of metformin.  Will change to glyburide 5 mg twice daily.  Patient will return in 1 week to review blood sugar log.  [facility] Will be due for next growth scan in 3-4 weeks.   S[place]ariss[place] also still noting issues with a cough from her recent URI.  Notes that the cough can oftentimes be very forceful and disturbs her wrist.  Is currently taking Robitussin with minimal relief.  Will prescribe Tessalon Perles to use intermittently.  Patient notes she has used this before in the past and has been helpful.  Baseline PHQ-9 performed today due to patient's history of depression.  Screening is mildly elevated (score = 12). Discussed symptoms, patient notes it is mostly just stress (current financial situation).    Patient desires to discuss contraception. Notes that she  desires tubal ligation after this pregnancy.  Medicaid BTL forms signed.

## 2022-08-01 ENCOUNTER — Encounter: Payer: BC Managed Care – PPO | Admitting: Obstetrics and Gynecology

## 2022-08-01 ENCOUNTER — Institutional Professional Consult (permissible substitution): Payer: Self-pay | Admitting: Licensed Clinical Social Worker

## 2022-08-04 ENCOUNTER — Ambulatory Visit (INDEPENDENT_AMBULATORY_CARE_PROVIDER_SITE_OTHER): Payer: BC Managed Care – PPO | Admitting: Obstetrics

## 2022-08-04 ENCOUNTER — Encounter: Payer: Self-pay | Admitting: Obstetrics

## 2022-08-04 VITALS — BP 113/84 | HR 100 | Wt 247.1 lb

## 2022-08-04 DIAGNOSIS — O0990 Supervision of high risk pregnancy, unspecified, unspecified trimester: Secondary | ICD-10-CM

## 2022-08-04 DIAGNOSIS — Z3A26 26 weeks gestation of pregnancy: Secondary | ICD-10-CM

## 2022-08-04 DIAGNOSIS — O0992 Supervision of high risk pregnancy, unspecified, second trimester: Secondary | ICD-10-CM

## 2022-08-04 LAB — POCT URINALYSIS DIPSTICK OB
Bilirubin, UA: NEGATIVE
Blood, UA: NEGATIVE
Glucose, UA: NEGATIVE
Ketones, UA: NEGATIVE
Leukocytes, UA: NEGATIVE
Nitrite, UA: NEGATIVE
POC,PROTEIN,UA: NEGATIVE
Spec Grav, UA: 1.015 (ref 1.010–1.025)
Urobilinogen, UA: 0.2 E.U./dL
pH, UA: 6 (ref 5.0–8.0)

## 2022-08-04 NOTE — Progress Notes (Signed)
Routine Prenatal Care Visit  Subjective  Penny Gonzalez is a 27 y.o. [phone] at 10w5dbeing seen today for ongoing prenatal care.  She is currently monitored for the following issues for this high-risk pregnancy and has Vitamin D deficiency; Major depression; Proteinuria; Essential hypertension, benign; Fatty liver; Acanthosis nigricans; Obesity in pregnancy; Recurrent major depressive disorder, [place] partial [place]; Supervision of high risk pregnancy, antepartum; History of preterm delivery, currently pregnant; History of pre-eclampsia in prior pregnancy, currently pregnant; Preexisting diabetes complicating pregnancy, antepartum; and Unwanted fertility on their problem list.  ----------------------------------------------------------------------------------- Patient reports no bleeding and no leaking.   Contractions: Irritability. Vag. Bleeding: None.  Movement: Present. Leaking Fluid denies.  ----------------------------------------------------------------------------------- The following portions of the patient's history were reviewed and updated as appropriate: allergies, current medications, past family history, past medical history, past social history, past surgical history and problem list. Problem list updated.  Objective  Blood pressure 113/84, pulse 100, weight 247 lb 1.6 oz (112.1 kg), last menstrual period 01/29/2022. Pregravid weight 214 lb (97.1 kg) Total Weight Gain 33 lb 1.6 oz (15 kg) Urinalysis: Urine Protein Negative  Urine Glucose Negative  Fetal Status: Fetal Heart Rate (bpm): 150 Fundal Height: 30 cm Movement: Present     General:  Alert, oriented and cooperative. Patient is in no acute distress.  Skin: Skin is warm and dry. No rash noted.   Cardiovascular: Normal heart rate noted  Respiratory: Normal respiratory effort, no problems with respiration noted  Abdomen: Soft, gravid, appropriate for gestational age. Pain/Pressure: Present     Pelvic:  Cervical  exam deferred        Extremities: Normal range of motion.  Edema: Mild pitting, slight indentation  Mental Status: Normal mood and affect. Normal behavior. Normal judgment and thought content.   Assessment   27y.o. [id number] 288w5dy  11/05/2022, by Last Menstrual Period presenting for routine prenatal visit  Plan   four Problems (from 04/28/22 to present)     Problem Noted Resolved   Supervision of high risk pregnancy, antepartum 04/28/2022 by JoCleophas DunkerCMA No   Overview Addendum 07/28/2022 11:54 AM by BoChilton GreathouseCMA     Clinical Staff Provider  Office Location  [facility] Dating  Not found.  Language  English Anatomy [place]  Flu Vaccine  offer Genetic Screen  NIPS:   TDaP vaccine   offer Hgb A1C or  GTT Early : Third trimester :   Covid declined   LAB RESULTS   Rh[facility]am  --/--/O POS [facility]formed [facility] [place] [id number]([phone])  Blood Type   Feeding Plan breast Antibody    Contraception tubal Rubella    Circumcision yes RPR     Pediatrician  undecided HBsAg     Support Person AnElberta FortisIV    Prenatal Classes yes Varicella     GBS  (For PCN allergy, check sensitivities)   BTL Consent Signed 07/27/22 Hep C     VBAC Consent  Pap Diagnosis  Date Value Ref Range Status  11/28/2018   Final   NEGATIVE FOR INTRAEPITHELIAL LESIONS OR MALIGNANCY.  11/28/2018   Final   FUNGAL ORGANISMS PRESENT CONSISTENT WITH CANDIDA SPP.      Hgb Electro      CF      SMA                   Preterm labor symptoms and general obstetric precautions including but  not limited to vaginal bleeding, contractions, leaking of fluid and fetal movement were reviewed in detail with the patient. Reviewed blood sugars from 2/15 to present since patient was put on glyburide. 9 high blood sugar readings noted in 1 week. Sugars improving with start of glyburide. Discussed high risk status due to gestational diabetes and need for OB visits in the  future. Patient verbalized understanding.  Please refer to After Visit Summary for other counseling recommendations.   Return in about 2 weeks (around 08/18/2022) for return OB, Please schedule this patient with MDs for the forseeable future. Imagene Riches, CNM  08/04/2022 2:01 PM

## 2022-08-09 ENCOUNTER — Telehealth: Payer: Self-pay

## 2022-08-09 NOTE — Telephone Encounter (Signed)
Left message for patient to call office regarding 08/18/22 appt. At [facility] - need to move all future appointments to Wednesday

## 2022-08-10 ENCOUNTER — Encounter: Payer: BC Managed Care – PPO | Admitting: Obstetrics and Gynecology

## 2022-08-16 ENCOUNTER — Other Ambulatory Visit: Payer: Self-pay

## 2022-08-16 DIAGNOSIS — O09299 Supervision of pregnancy with other poor reproductive or obstetric history, unspecified trimester: Secondary | ICD-10-CM

## 2022-08-16 DIAGNOSIS — O09899 Supervision of other high risk pregnancies, unspecified trimester: Secondary | ICD-10-CM

## 2022-08-16 DIAGNOSIS — O24112 Pre-existing diabetes mellitus, type 2, in pregnancy, second trimester: Secondary | ICD-10-CM

## 2022-08-16 DIAGNOSIS — O99210 Obesity complicating pregnancy, unspecified trimester: Secondary | ICD-10-CM

## 2022-08-17 ENCOUNTER — Other Ambulatory Visit: Payer: Self-pay

## 2022-08-17 ENCOUNTER — Ambulatory Visit: Payer: BC Managed Care – PPO | Attending: Obstetrics

## 2022-08-17 VITALS — BP 136/80 | HR 107 | Temp 98.3°F | Resp 18 | Wt 246.5 lb

## 2022-08-17 DIAGNOSIS — Z362 Encounter for other antenatal screening follow-up: Secondary | ICD-10-CM | POA: Diagnosis not present

## 2022-08-17 DIAGNOSIS — O99213 Obesity complicating pregnancy, third trimester: Secondary | ICD-10-CM | POA: Insufficient documentation

## 2022-08-17 DIAGNOSIS — O09293 Supervision of pregnancy with other poor reproductive or obstetric history, third trimester: Secondary | ICD-10-CM | POA: Diagnosis not present

## 2022-08-17 DIAGNOSIS — E119 Type 2 diabetes mellitus without complications: Secondary | ICD-10-CM

## 2022-08-17 DIAGNOSIS — O24113 Pre-existing diabetes mellitus, type 2, in pregnancy, third trimester: Secondary | ICD-10-CM | POA: Insufficient documentation

## 2022-08-17 DIAGNOSIS — E669 Obesity, unspecified: Secondary | ICD-10-CM

## 2022-08-17 DIAGNOSIS — O99210 Obesity complicating pregnancy, unspecified trimester: Secondary | ICD-10-CM

## 2022-08-17 DIAGNOSIS — O24112 Pre-existing diabetes mellitus, type 2, in pregnancy, second trimester: Secondary | ICD-10-CM

## 2022-08-17 DIAGNOSIS — O10913 Unspecified pre-existing hypertension complicating pregnancy, third trimester: Secondary | ICD-10-CM | POA: Diagnosis not present

## 2022-08-17 DIAGNOSIS — O10013 Pre-existing essential hypertension complicating pregnancy, third trimester: Secondary | ICD-10-CM | POA: Diagnosis not present

## 2022-08-17 DIAGNOSIS — O09899 Supervision of other high risk pregnancies, unspecified trimester: Secondary | ICD-10-CM

## 2022-08-17 DIAGNOSIS — Z7984 Long term (current) use of oral hypoglycemic drugs: Secondary | ICD-10-CM | POA: Insufficient documentation

## 2022-08-17 DIAGNOSIS — Z3A28 28 weeks gestation of pregnancy: Secondary | ICD-10-CM | POA: Diagnosis not present

## 2022-08-17 DIAGNOSIS — O09299 Supervision of pregnancy with other poor reproductive or obstetric history, unspecified trimester: Secondary | ICD-10-CM

## 2022-08-18 ENCOUNTER — Ambulatory Visit: Payer: BC Managed Care – PPO

## 2022-08-19 ENCOUNTER — Ambulatory Visit (INDEPENDENT_AMBULATORY_CARE_PROVIDER_SITE_OTHER): Payer: BC Managed Care – PPO | Admitting: Obstetrics and Gynecology

## 2022-08-19 VITALS — BP 124/90 | HR 106 | Wt 250.4 lb

## 2022-08-19 DIAGNOSIS — Z3A28 28 weeks gestation of pregnancy: Secondary | ICD-10-CM

## 2022-08-19 DIAGNOSIS — O0993 Supervision of high risk pregnancy, unspecified, third trimester: Secondary | ICD-10-CM

## 2022-08-19 DIAGNOSIS — O0990 Supervision of high risk pregnancy, unspecified, unspecified trimester: Secondary | ICD-10-CM

## 2022-08-19 DIAGNOSIS — Z23 Encounter for immunization: Secondary | ICD-10-CM | POA: Diagnosis not present

## 2022-08-19 DIAGNOSIS — Z131 Encounter for screening for diabetes mellitus: Secondary | ICD-10-CM

## 2022-08-19 DIAGNOSIS — Z130 Encounter for screening for diseases of the blood and blood-forming organs and certain disorders involving the immune mechanism: Secondary | ICD-10-CM

## 2022-08-19 DIAGNOSIS — Z113 Encounter for screening for infections with a predominantly sexual mode of transmission: Secondary | ICD-10-CM

## 2022-08-19 LAB — POCT URINALYSIS DIPSTICK OB
Bilirubin, UA: NEGATIVE
Blood, UA: NEGATIVE
Ketones, UA: NEGATIVE
Leukocytes, UA: NEGATIVE
Nitrite, UA: NEGATIVE
Spec Grav, UA: 1.015 (ref 1.010–1.025)
Urobilinogen, UA: 0.2 E.U./dL
pH, UA: 6.5 (ref 5.0–8.0)

## 2022-08-19 NOTE — Progress Notes (Signed)
ROB [redacted]w[redacted]d She had some Braxton Hick's contractions yesterday. She reports good fetal movement. She has some soreness in her abdomen. BTC/TDAP done today. No GTT because she is a diabetic. CBC and RPR done.

## 2022-08-19 NOTE — Patient Instructions (Signed)
Tdap (Tetanus, Diphtheria, Pertussis) Vaccine: What You Need to Know 1. Why get vaccinated? Tdap vaccine can prevent tetanus, diphtheria, and pertussis. Diphtheria and pertussis spread from person to person. Tetanus enters the body through cuts or wounds. TETANUS (T) causes painful stiffening of the muscles. Tetanus can lead to serious health problems, including being unable to open the mouth, having trouble swallowing and breathing, or death. DIPHTHERIA (D) can lead to difficulty breathing, heart failure, paralysis, or death. PERTUSSIS (aP), also known as "whooping cough," can cause uncontrollable, violent coughing that makes it hard to breathe, eat, or drink. Pertussis can be extremely serious especially in babies and young children, causing pneumonia, convulsions, brain damage, or death. In teens and adults, it can cause weight loss, loss of bladder control, passing out, and rib fractures from severe coughing. 2. Tdap vaccine Tdap is only for children 7 years and older, adolescents, and adults.  Adolescents should receive a single dose of Tdap, preferably at age 11 or 12 years. Pregnant people should get a dose of Tdap during every pregnancy, preferably during the early part of the third trimester, to help protect the newborn from pertussis. Infants are most at risk for severe, life-threatening complications from pertussis. Adults who have never received Tdap should get a dose of Tdap. Also, adults should receive a booster dose of either Tdap or Td (a different vaccine that protects against tetanus and diphtheria but not pertussis) every 10 years, or after 5 years in the case of a severe or dirty wound or burn. Tdap may be given at the same time as other vaccines. 3. Talk with your health care provider Tell your vaccine provider if the person getting the vaccine: Has had an allergic reaction after a previous dose of any vaccine that protects against tetanus, diphtheria, or pertussis, or has any  severe, life-threatening allergies Has had a coma, decreased level of consciousness, or prolonged seizures within 7 days after a previous dose of any pertussis vaccine (DTP, DTaP, or Tdap) Has seizures or another nervous system problem Has ever had Guillain-Barr Syndrome (also called "GBS") Has had severe pain or swelling after a previous dose of any vaccine that protects against tetanus or diphtheria In some cases, your health care provider may decide to postpone Tdap vaccination until a future visit. People with minor illnesses, such as a cold, may be vaccinated. People who are moderately or severely ill should usually wait until they recover before getting Tdap vaccine.  Your health care provider can give you more information. 4. Risks of a vaccine reaction Pain, redness, or swelling where the shot was given, mild fever, headache, feeling tired, and nausea, vomiting, diarrhea, or stomachache sometimes happen after Tdap vaccination. People sometimes faint after medical procedures, including vaccination. Tell your provider if you feel dizzy or have vision changes or ringing in the ears.  As with any medicine, there is a very remote chance of a vaccine causing a severe allergic reaction, other serious injury, or death. 5. What if there is a serious problem? An allergic reaction could occur after the vaccinated person leaves the clinic. If you see signs of a severe allergic reaction (hives, swelling of the face and throat, difficulty breathing, a fast heartbeat, dizziness, or weakness), call 9-1-1 and get the person to the nearest hospital. For other signs that concern you, call your health care provider.  Adverse reactions should be reported to the Vaccine Adverse Event Reporting System (VAERS). Your health care provider will usually file this report, or you   can do it yourself. Visit the VAERS website at www.vaers.hhs.gov or call 1-800-822-7967. VAERS is only for reporting reactions, and VAERS staff  members do not give medical advice. 6. The National Vaccine Injury Compensation Program The National Vaccine Injury Compensation Program (VICP) is a federal program that was created to compensate people who may have been injured by certain vaccines. Claims regarding alleged injury or death due to vaccination have a time limit for filing, which may be as short as two years. Visit the VICP website at www.hrsa.gov/vaccinecompensation or call 1-800-338-2382 to learn about the program and about filing a claim. 7. How can I learn more? Ask your health care provider. Call your local or state health department. Visit the website of the Food and Drug Administration (FDA) for vaccine package inserts and additional information at www.fda.gov/vaccines-blood-biologics/vaccines. Contact the Centers for Disease Control and Prevention (CDC): Call 1-800-232-4636 (1-800-CDC-INFO) or Visit CDC's website at www.cdc.gov/vaccines. Source: CDC Vaccine Information Statement Tdap (Tetanus, Diphtheria, Pertussis) Vaccine (01/17/2020) This same material is available at www.cdc.gov for no charge. This information is not intended to replace advice given to you by your health care provider. Make sure you discuss any questions you have with your health care provider. Document Revised: 03/09/2022 Document Reviewed: 03/09/2022 Elsevier Patient Education  2023 Elsevier Inc.  

## 2022-08-20 ENCOUNTER — Encounter: Payer: Self-pay | Admitting: Obstetrics and Gynecology

## 2022-08-20 LAB — CBC
Hematocrit: 32.8 % — ABNORMAL LOW (ref 34.0–46.6)
Hemoglobin: 10.9 g/dL — ABNORMAL LOW (ref 11.1–15.9)
MCH: 27.9 pg (ref 26.6–33.0)
MCHC: 33.2 g/dL (ref 31.5–35.7)
MCV: 84 fL (ref 79–97)
Platelets: 222 10*3/uL (ref 150–450)
RBC: 3.91 x10E6/uL (ref 3.77–5.28)
RDW: 14.1 % (ref 11.7–15.4)
WBC: 8.2 10*3/uL (ref 3.4–10.8)

## 2022-08-20 LAB — RPR: RPR Ser Ql: NONREACTIVE

## 2022-08-20 LAB — HEMOGLOBIN A1C
Est. average glucose Bld gHb Est-mCnc: 148 mg/dL
Hgb A1c MFr Bld: 6.8 % — ABNORMAL HIGH (ref 4.8–5.6)

## 2022-08-20 NOTE — Progress Notes (Signed)
ROB: Penny Gonzalez is a 27 y.o. [phone] at [redacted]w[redacted]d Patient notes some Braxton Hicks and soreness yesterday.  Does admit to celebrating her birthday and doing a lot of walking yesterday.  Encouraged increasing hydration, Tylenol as needed.  Patient desires to purchase a belly band, desires to be measured.  Reviewed blood sugars, multiple fastings above 100.  Rare postprandial above 140 (notes it depends on what she eats).  Will increase evening dose of glyburide to 7.5 mg.  Continue morning dose of 5 mg. Has next growth scan scheduled. To begin antenatal surveillance at 32 weeks. For 28 week labs today and HgbA1c.

## 2022-08-21 ENCOUNTER — Other Ambulatory Visit: Payer: Self-pay | Admitting: Obstetrics and Gynecology

## 2022-08-21 ENCOUNTER — Inpatient Hospital Stay (HOSPITAL_COMMUNITY)
Admission: AD | Admit: 2022-08-21 | Discharge: 2022-08-21 | Disposition: A | Discharge status: Home / Self Care | Payer: BC Managed Care – PPO | Attending: Obstetrics & Gynecology | Admitting: Obstetrics & Gynecology

## 2022-08-21 ENCOUNTER — Encounter (HOSPITAL_COMMUNITY): Payer: Self-pay | Admitting: Obstetrics & Gynecology

## 2022-08-21 DIAGNOSIS — R102 Pelvic and perineal pain: Secondary | ICD-10-CM | POA: Insufficient documentation

## 2022-08-21 DIAGNOSIS — Z3A29 29 weeks gestation of pregnancy: Secondary | ICD-10-CM | POA: Insufficient documentation

## 2022-08-21 DIAGNOSIS — O26893 Other specified pregnancy related conditions, third trimester: Secondary | ICD-10-CM

## 2022-08-21 DIAGNOSIS — N949 Unspecified condition associated with female genital organs and menstrual cycle: Secondary | ICD-10-CM

## 2022-08-21 LAB — URINALYSIS, ROUTINE W REFLEX MICROSCOPIC
Bilirubin Urine: NEGATIVE
Glucose, UA: NEGATIVE mg/dL
Hgb urine dipstick: NEGATIVE
Ketones, ur: NEGATIVE mg/dL
Nitrite: NEGATIVE
Protein, ur: NEGATIVE mg/dL
Specific Gravity, Urine: 1.016 (ref 1.005–1.030)
pH: 5 (ref 5.0–8.0)

## 2022-08-21 LAB — WET PREP, GENITAL
Sperm: NONE SEEN
Trich, Wet Prep: NONE SEEN
WBC, Wet Prep HPF POC: >=10 — AB (ref <10)
Yeast Wet Prep HPF POC: NONE SEEN

## 2022-08-21 MED ORDER — GLYBURIDE 2.5 MG PO TABS: 2.5000 mg | ORAL_TABLET | Freq: Two times a day (BID) | ORAL | 3 refills | Status: DC

## 2022-08-21 MED ORDER — CYCLOBENZAPRINE HCL 5 MG PO TABS
10.0000 mg | ORAL_TABLET | Freq: Once | ORAL | Status: AC
  Administered 2022-08-21: 10 mg via ORAL
  Filled 2022-08-21: qty 2

## 2022-08-21 MED ORDER — ACETAMINOPHEN 500 MG PO TABS
1000.0000 mg | ORAL_TABLET | Freq: Once | ORAL | Status: AC
  Administered 2022-08-21: 1000 mg via ORAL
  Filled 2022-08-21: qty 2

## 2022-08-21 MED ORDER — CYCLOBENZAPRINE HCL 10 MG PO TABS: 10.0000 mg | ORAL_TABLET | Freq: Two times a day (BID) | ORAL | 0 refills | Status: DC | PRN

## 2022-08-21 NOTE — MAU Note (Signed)
Penny Gonzalez is a 27 y.o. at 90w1dhere in MAU reporting: felt something pop  in her groin on left side when she rolled over.  With any movement causes abd /pelvic pain.  Called md, told to come in. Denies any bleeding or leaking.  Reports +FM.  Onset of complaint: 0830 Pain score: 7 Vitals:   08/21/22 1000  BP: 126/70  Pulse: (!) 104  Resp: 18  Temp: 98.3 F (36.8 C)  SpO2: 99%     FHT:160 Lab orders placed from triage:  ua

## 2022-08-21 NOTE — MAU Provider Note (Signed)
History     CSN: [id number]  Arrival date and time: 08/21/22 [id number]   None     Chief Complaint  Patient presents with   Abdominal Pain   Pelvic Pain   HPI Penny Gonzalez is a 27 y.o. [phone] at 51w1dwho presents to MAU for pelvic pain and lower abdominal cramping. She reports she turned on to her left side this morning when she "felt a pop" in her left groin. Since she felt the pop she has had a lot of pelvic pain that is aggravated with movement. She reports she has had ongoing pelvic pain and recently ordered a support belt however has not received it in the mail yet. She denies contractions, vaginal bleeding, leaking fluid, or urinary s/s. She endorses active fetal movement.   Pregnancy course: T2DM on Glyburide, cHTN no meds, hx pre-eclampsia. [facility]   OB History     Gravida  4   Para  1   Term      Preterm  1   AB  2   Living  1      SAB      IAB  2   Ectopic      Multiple      Live Births  1           Past Medical History:  Diagnosis Date   Anemia, iron deficiency, inadequate dietary intake 12/01/2014   Depression    patient has not been officially diagnosed but has symptoms   Di[place] mellitus [place] complication [place]    Recently changed to Glyburide and thinks it is helping   Essential hypertension, benign 03/31/2016   Irregular periods/menstrual cycles    Joint pain of leg    Major depression 11/23/2015   Sciatica     Past Surgical History:  Procedure Laterality Date   CHOLECYSTECTOMY N/A 05/19/2016   Procedure: LAPAROSCOPIC CHOLECYSTECTOMY WITH INTRAOPERATIVE CHOLANGIOGRAM;  Surgeon: JLeonie Green MD;  Location: [facility] ORS;  Service: General;  Laterality: N/A;    Family History  Problem Relation Age of Onset   Diabetes Mother    Arthritis Father    Diabetes Father    Heart disease Father    Hypertension Father    Stroke Father    Lupus Sister    Deafness Sister     Diabetes Maternal Grandmother    Hypertension Maternal Grandmother    Heart disease Paternal Grandmother    Hypertension Paternal Grandmother     Social History   Tobacco Use   Smoking status: Never   Smokeless tobacco: Never  Vaping Use   Vaping Use: Never used  Substance Use Topics   Alcohol use: Not Currently    Comment: OCC   Drug use: No    Allergies:  Allergies  Allergen Reactions   Latex Rash    Pruritic, urticaria    No medications prior to admission.   Review of Systems  Genitourinary:  Positive for pelvic pain.       Vaginal itching  All other systems reviewed and are negative.  Physical Exam   Patient Vitals for the past 24 hrs:  BP Temp Pulse Resp SpO2 Height Weight  08/21/22 1201 123/69 -- 91 -- -- -- --  08/21/22 1146 121/64 -- 92 -- -- -- --  08/21/22 1130 137/71 -- 92 18 -- -- --  08/21/22 1028 139/76 -- (!) 114 18 -- -- --  08/21/22 1000 126/70 98.3 F (36.8 C) (!)  104 18 99 % '5\' 8"'$  (1.727 m) 112.5 kg   Physical Exam Vitals and nursing note reviewed. Exam conducted with a chaperone present.  Constitutional:      General: She is not in acute distress. Cardiovascular:     Rate and Rhythm: Tachycardia present.  Pulmonary:     Effort: Pulmonary effort is normal. No respiratory distress.  Abdominal:     Palpations: Abdomen is soft.     Tenderness: There is no abdominal tenderness. There is no right CVA tenderness or left CVA tenderness.     Comments: Gravid   Genitourinary:    Comments: Blind swabs collected Skin:    General: Skin is warm and dry.  Neurological:     General: No focal deficit present.     Mental Status: She is alert and oriented to person, place, and time.  Psychiatric:        Mood and Affect: Mood normal.        Behavior: Behavior normal.   Dilation: Closed Effacement (%): Thick Cervical Position: Posterior Exam by:: [name], CNM  NST FHR: 145 bpm, moderate variability, +15x15 accels, no decels Toco: occ  ui  Results for orders placed or performed during the hospital encounter of 08/21/22 (from the past 24 hour(s))  Urinalysis, Routine w reflex microscopic -Urine, Clean Catch     Status: Abnormal   Collection Time: 08/21/22 10:11 AM  Result Value Ref Range   Color, Urine YELLOW YELLOW   APPearance CLOUDY (A) CLEAR   Specific Gravity, Urine 1.016 1.005 - 1.030   pH 5.0 5.0 - 8.0   Glucose, UA NEGATIVE NEGATIVE mg/dL   Hgb urine dipstick NEGATIVE NEGATIVE   Bilirubin Urine NEGATIVE NEGATIVE   Ketones, ur NEGATIVE NEGATIVE mg/dL   Protein, ur NEGATIVE NEGATIVE mg/dL   Nitrite NEGATIVE NEGATIVE   Leukocytes,Ua SMALL (A) NEGATIVE   RBC / HPF 0-5 0 - 5 RBC/hpf   WBC, UA 21-50 0 - 5 WBC/hpf   Bacteria, UA MANY (A) NONE SEEN   Squamous Epithelial / HPF 0-5 0 - 5 /HPF   Mucus PRESENT   Wet prep, genital     Status: Abnormal   Collection Time: 08/21/22 11:05 AM   Specimen: Vaginal  Result Value Ref Range   Yeast Wet Prep HPF POC NONE SEEN NONE SEEN   Trich, Wet Prep NONE SEEN NONE SEEN   Clue Cells Wet Prep HPF POC PRESENT (A) NONE SEEN   WBC, Wet Prep HPF POC >=10 (A) <10   Sperm NONE SEEN    MAU Course  Procedures  MDM UA, culture Wet prep, GC/CT Tylenol/Flexeril Heat pack PO hydration NST  UA with some leukocytes and bacteria. Will add urine culture. Wet prep negative for yeats. GC/CT pending. NST reassuring for gestational age. Toco with irregular occasional ui. Cervix closed/thick/posterior. Patient given Flexeril and Tylenol.   On reassessment, patient reports improvement in pelvic pain but now reports back pain. She is unsure if pain in back is related to lying in bed for long period of time. Pain is aggravated with fetal movement or when she changes positions. Heat does help with pain. Suspect MSK pain. I did offer a 1 time dose of Percocet but patient declines. I have a low suspicion for preterm labor, pyelonephritis, or kidney stones. I reviewed at length common  discomforts of pregnancy. I reviewed use of maternity support belt along with continued use of heat/ice, Tylenol and Flexeril prn. I also recommend pelvic floor PT, patient would like  referral placed.    Assessment and Plan   1. [redacted] weeks gestation of pregnancy   2. Pelvic pain affecting pregnancy in third trimester, antepartum   3. Round ligament pain    - Discharge home in stable condition - PT referral placed. Rx for Flexeril - Return precautions reviewed. Return to MAU for new/worsening symptoms - Keep OB appointment as scheduled  Renee Harder, CNM 08/21/2022, 2:41 PM

## 2022-08-22 ENCOUNTER — Telehealth: Payer: Self-pay

## 2022-08-22 LAB — GC/CHLAMYDIA PROBE AMP (~~LOC~~) NOT AT ARMC
Chlamydia: NEGATIVE
Comment: NEGATIVE
Comment: NORMAL
Neisseria Gonorrhea: NEGATIVE

## 2022-08-22 LAB — CULTURE, OB URINE: Culture: NO GROWTH

## 2022-08-22 NOTE — Telephone Encounter (Signed)
Patient called back. She did go to ED after turning over and hearing something pop. She has been in pain since it happened. She is taking Tylenol and muscle relaxer's when she is not working to help with pain. She was also referred to PT.

## 2022-08-23 ENCOUNTER — Encounter: Payer: Self-pay | Admitting: Obstetrics and Gynecology

## 2022-08-24 NOTE — Telephone Encounter (Signed)
Spoke with Patient denied any other symptoms baby is moving well hasn't started using her belt but will, has desk job advised to walk around and not sit for prolonged times see if that helps. No other concern pt will continue to monitor symptom.Advised when to go to ed if needed.

## 2022-08-25 ENCOUNTER — Other Ambulatory Visit: Payer: Self-pay

## 2022-08-25 DIAGNOSIS — O24319 Unspecified pre-existing diabetes mellitus in pregnancy, unspecified trimester: Secondary | ICD-10-CM

## 2022-08-25 DIAGNOSIS — O0990 Supervision of high risk pregnancy, unspecified, unspecified trimester: Secondary | ICD-10-CM

## 2022-09-01 ENCOUNTER — Ambulatory Visit (INDEPENDENT_AMBULATORY_CARE_PROVIDER_SITE_OTHER): Payer: BC Managed Care – PPO | Admitting: Obstetrics and Gynecology

## 2022-09-01 ENCOUNTER — Encounter: Payer: Self-pay | Admitting: Obstetrics and Gynecology

## 2022-09-01 VITALS — BP 102/75 | HR 129 | Wt 251.2 lb

## 2022-09-01 DIAGNOSIS — O99213 Obesity complicating pregnancy, third trimester: Secondary | ICD-10-CM

## 2022-09-01 DIAGNOSIS — O24319 Unspecified pre-existing diabetes mellitus in pregnancy, unspecified trimester: Secondary | ICD-10-CM

## 2022-09-01 DIAGNOSIS — R102 Pelvic and perineal pain: Secondary | ICD-10-CM

## 2022-09-01 DIAGNOSIS — O0990 Supervision of high risk pregnancy, unspecified, unspecified trimester: Secondary | ICD-10-CM

## 2022-09-01 DIAGNOSIS — O99210 Obesity complicating pregnancy, unspecified trimester: Secondary | ICD-10-CM

## 2022-09-01 DIAGNOSIS — O09899 Supervision of other high risk pregnancies, unspecified trimester: Secondary | ICD-10-CM

## 2022-09-01 DIAGNOSIS — I10 Essential (primary) hypertension: Secondary | ICD-10-CM

## 2022-09-01 DIAGNOSIS — E669 Obesity, unspecified: Secondary | ICD-10-CM

## 2022-09-01 DIAGNOSIS — Z3A30 30 weeks gestation of pregnancy: Secondary | ICD-10-CM

## 2022-09-01 DIAGNOSIS — O10013 Pre-existing essential hypertension complicating pregnancy, third trimester: Secondary | ICD-10-CM

## 2022-09-01 LAB — POCT URINALYSIS DIPSTICK OB
Bilirubin, UA: NEGATIVE
Blood, UA: NEGATIVE
Leukocytes, UA: NEGATIVE
Nitrite, UA: NEGATIVE
Spec Grav, UA: 1.025 (ref 1.010–1.025)
Urobilinogen, UA: 0.2 E.U./dL
pH, UA: 6.5 (ref 5.0–8.0)

## 2022-09-01 NOTE — Progress Notes (Signed)
ROB [redacted]w[redacted]d: She has a lot of pain and pressure. She said she is having lots of Braxton Hick's contraction. She has sob when she has to talk a lot, and laying down at night she feels like she can't breath.

## 2022-09-01 NOTE — Progress Notes (Signed)
ROB: Patient is a 27 y.o. [id number] at [redacted]w[redacted]d who presents for routine OB care.  Pregnancy is complicated by cHTN (no meds), obesity, Type II DM (currently on Glyburide), and h/o preterm delivery (36 weeks). Prior h/o pre-eclampsia. Patient has complaints of significant pelvic pressure, also noting some pain. Has tried use of belly band, worked well for 3 days but pain returned.  Was seen in triage last week due to pain. Notes she turned on her side while there and felt something pop. Discussed option of referral to chiropractor vs physical therapy (recommended). Patient ok for referral.  Has had prescription for Flexeril but does not desire to utilize. Glucosuria noted today, reviewed blood sugars, postprandials appear to be wnl, fastings still elevated (110s-130).  Will increase to 10 mg of gylyburide in evenings, remain on 7.5 mg in morning. Desires to know when she will be induced, discussed between 37-38 weeks depending on blood sugar management. Has next growth scan April 3rd but based on FH, likely a larger fetus. Patient notes difficulty breathing, especially when trying to lie down in the evenings despite propping with multiple pillows, or when sitting or exerting herself. Discussed that this was likely due to posture and larger belly. RTC in 2 weeks.  To begin antenatal surveillance then.

## 2022-09-05 ENCOUNTER — Encounter: Payer: BC Managed Care – PPO | Attending: Obstetrics and Gynecology | Admitting: Dietician

## 2022-09-05 ENCOUNTER — Encounter: Payer: Self-pay | Admitting: Dietician

## 2022-09-05 VITALS — Ht 68.0 in | Wt 251.8 lb

## 2022-09-05 DIAGNOSIS — Z3A00 Weeks of gestation of pregnancy not specified: Secondary | ICD-10-CM | POA: Diagnosis not present

## 2022-09-05 DIAGNOSIS — O24319 Unspecified pre-existing diabetes mellitus in pregnancy, unspecified trimester: Secondary | ICD-10-CM | POA: Diagnosis not present

## 2022-09-05 DIAGNOSIS — O0990 Supervision of high risk pregnancy, unspecified, unspecified trimester: Secondary | ICD-10-CM | POA: Insufficient documentation

## 2022-09-05 DIAGNOSIS — Z713 Dietary counseling and surveillance: Secondary | ICD-10-CM | POA: Diagnosis not present

## 2022-09-05 NOTE — Progress Notes (Signed)
Diabetes Self-Management Education  Visit Type: First/Initial  Appt. Start Time: 1615 Appt. End Time: [id number]  09/05/2022  [name], identified by name and date of birth, is a 27 y.o. female with a diagnosis of Diabetes: Type 2 (with pregnancy) with pregnancy  ASSESSMENT  Height 5\' 8"  (1.727 m), weight 251 lb 12.8 oz (114.2 kg), last menstrual period 01/29/2022. Body mass index is 38.29 kg/m.   Diabetes Self-Management Education - 09/05/22 1629       Visit Information   Visit Type First/Initial      Initial Visit   Diabetes Type Type 2   with pregnancy   Are you currently following a meal plan? Yes    Are you taking your medications as prescribed? Yes      Health Coping   How would you rate your overall health? Fair      Psychosocial Assessment   Patient Belief/Attitude about Diabetes Other (comment)   drained, depressed at times   What is the hardest part about your diabetes right now, causing you the most concern, or is the most worrisome to you about your diabetes?   Other (comment)   managing changes in blood sugars with pregnancy   Self-care barriers None    Self-management support Family    Special Needs None    Preferred Learning Style Auditory;Hands on    Learning Readiness Change in progress    How often do you need to have someone help you when you read instructions, pamphlets, or other written materials from your doctor or pharmacy? 1 - Never    What is the last grade level you completed in school? some college      Pre-Education Assessment   Patient understands the diabetes disease and treatment process. Comprehends key points    Patient understands incorporating nutritional management into lifestyle. Needs Review    Patient undertands incorporating physical activity into lifestyle. Needs Review    Patient understands using medications safely. Demonstrates understanding / competency    Patient understands monitoring blood glucose, interpreting and  using results Demonstrates understanding / competency    Patient understands prevention, detection, and treatment of acute complications. Comprehends key points    Patient understands prevention, detection, and treatment of chronic complications. Needs Review    Patient understands how to develop strategies to address psychosocial issues. Needs Instruction    Patient understands how to develop strategies to promote health/change behavior. Demonstrates understanding / competency      Complications   Last HgB A1C per patient/outside source 6.8 %    How often do you check your blood sugar? 3-4 times/day    Fasting Blood glucose range (mg/dL) 70-129;130-179   109-140   Postprandial Blood glucose range (mg/dL) 70-129;130-179   85-192   Number of hypoglycemic episodes per month 2    Can you tell when your blood sugar is low? Yes    What do you do if your blood sugar is low? drank small cup juice    Number of hyperglycemic episodes ( >200mg /dL): Occasional   elevates briefly after some meals   Have you had a dilated eye exam in the past 12 months? Yes    Have you had a dental exam in the past 12 months? Yes    Are you checking your feet? Yes    How many days per week are you checking your feet? 1      Dietary Intake   Breakfast atkins bar; if very hungry -- [place] sausage and cheese  on skinny biscuit; string cheese; drink with protein powder, rolled oats, skim milk, cinnamon, sugar free pudding (but still notices BG increasing); sometimes skips    Snack (morning) occasionally peanuts; string cheese    Lunch salad with chicken; leftovers ie pepper steak; sm portion rice, broccoli; healthy choice meal; tries to avoid bread if eaten at breakfast; grilled chicken sandwich    Snack (afternoon) same as am; carrots and ranch dressing; rarely small bag of chips    Dinner chicken pot pie; pepper steak; salad with chicken; salad bowl from chipotle;    Snack (evening) same as pm    Beverage(s) water uses  1.5 liter drink containter with lemon or lime; crystal light; Dr pepper zero sugar occasionally, not daily      Activity / Exercise   Activity / Exercise Type ADL's   on-the -job activity; other activity limited due to sciatic pain     Patient Education   Previous Diabetes Education No    Disease Pathophysiology Other (comment)   pre-existing diabetes with pregnancy vs gestational diabetes   Healthy Eating Role of diet in the treatment of diabetes and the relationship between the three main macronutrients and blood glucose level;Food label reading, portion sizes and measuring food.;Reviewed blood glucose goals for pre and post meals and how to evaluate the patients' food intake on their blood glucose level.;Meal timing in regards to the patients' current diabetes medication.;Meal options for control of blood glucose level and chronic complications.    Being Active Role of exercise on diabetes management, blood pressure control and cardiac health.;Helped patient identify appropriate exercises in relation to his/her diabetes, diabetes complications and other health issue.    Medications Reviewed patients medication for diabetes, action, purpose, timing of dose and side effects.    Monitoring Interpreting lab values - A1C, lipid, urine microalbumina.;Identified appropriate SMBG and/or A1C goals.;Purpose and frequency of SMBG.    Acute complications Taught prevention, symptoms, and  treatment of hypoglycemia - the 15 rule.    Diabetes Stress and Support Role of stress on diabetes    Preconception care Pregnancy and GDM  Role of pre-pregnancy blood glucose control on the development of the fetus;Reviewed with patient blood glucose goals with pregnancy      Post-Education Assessment   Patient understands the diabetes disease and treatment process. Demonstrates understanding / competency    Patient understands incorporating nutritional management into lifestyle. Demonstrates understanding / competency     Patient undertands incorporating physical activity into lifestyle. Demonstrates understanding / competency    Patient understands using medications safely. Demonstrates understanding / competency    Patient understands monitoring blood glucose, interpreting and using results Demonstrates understanding / competency    Patient understands prevention, detection, and treatment of acute complications. Demonstrates understanding / competency    Patient understands prevention, detection, and treatment of chronic complications. N/A    Patient understands how to develop strategies to address psychosocial issues. Comprehends key points    Patient understands how to develop strategies to promote health/change behavior. Demonstrates understanding / competency      Outcomes   Expected Outcomes Demonstrated interest in learning. Expect positive outcomes    Future DMSE PRN    Program Status Completed             Individualized Plan for Diabetes Self-Management Training:   Learning Objective:  Patient will have a greater understanding of diabetes self-management  Patient education plan is to return as needed for additional educational support/ training.    Plan:  Patient Instructions  Continue to check blood sugars 4 times daily and bring to all appointments for review Keep carb intake to 30 grams with breakfast and 45g with lunch and dinner meals, 15-22g with snacks. Make sure to include a protein source with all meals and snacks. Choose some whole grain/ high fiber foods.  Expected Outcomes:  Demonstrated interest in learning. Expect positive outcomes  Education material provided: Plate planner with food lists; Diabetes Management for Mothers-to-Be booklet; sample menus, snacking handout  If problems or questions, patient to contact team via:  Phone and patient portal  Future DSME appointment: PRN

## 2022-09-05 NOTE — Patient Instructions (Addendum)
Continue to check blood sugars 4 times daily and bring to all appointments for review Keep carb intake to 30 grams with breakfast and 45g with lunch and dinner meals, 15-22g with snacks. Make sure to include a protein source with all meals and snacks. Choose some whole grain/ high fiber foods.

## 2022-09-06 ENCOUNTER — Telehealth: Payer: Self-pay

## 2022-09-06 NOTE — Telephone Encounter (Signed)
Pt calling; is currently at [facility] trying to talkto someone about issues she is having; should she go to [facility]?  [phone]  Pt states she is really tired, bs is low, blood work is good; is in a lot of pain and wants to go home.  Enc pt to stay since they have already started with her; to see it through; to see if she can lie down while there; maybe they will give her something for her pain.

## 2022-09-07 ENCOUNTER — Encounter: Payer: Self-pay | Admitting: Physical Therapy

## 2022-09-07 ENCOUNTER — Ambulatory Visit: Payer: BC Managed Care – PPO | Attending: Obstetrics and Gynecology | Admitting: Physical Therapy

## 2022-09-07 DIAGNOSIS — O26893 Other specified pregnancy related conditions, third trimester: Secondary | ICD-10-CM | POA: Diagnosis not present

## 2022-09-07 DIAGNOSIS — M533 Sacrococcygeal disorders, not elsewhere classified: Secondary | ICD-10-CM | POA: Diagnosis present

## 2022-09-07 DIAGNOSIS — R2689 Other abnormalities of gait and mobility: Secondary | ICD-10-CM | POA: Diagnosis present

## 2022-09-07 DIAGNOSIS — R102 Pelvic and perineal pain: Secondary | ICD-10-CM | POA: Insufficient documentation

## 2022-09-07 NOTE — Patient Instructions (Addendum)
  Proper body mechanics with getting out of a chair to decrease strain  on back &pelvic floor   Avoid holding your breath when Getting out of the chair:  Scoot to front part of chair chair Heels behind knees, feet are hip width apart, nose over toes  Inhale like you are smelling roses Exhale to stand    __   Lengthen Back rib by L  shoulder ( winging)    Lie on R  side , pillow between knees and under head  Pull  L arm overhead over mattress, grab the edge of mattress,pull it upward, drawing elbow away from ears  Breathing 10 reps  Brushing arm with 3/4 turn onto pillow behind back  Lying on R  side ,Pillow/ Block between knees     dragging top forearm across ribs below breast rotating 3/4 turn,  rotating  _L_ only this week ,  relax onto the pillow behind the back  and then back to other palm , maintain top palm on body whole top and not lift shoulder  ___  Sleeping with pillow between knees   ___  Wearing SIJ belt

## 2022-09-07 NOTE — Therapy (Signed)
OUTPATIENT PHYSICAL THERAPY EVALUATION   Patient Name: Penny Gonzalez MRN: [id number] DOB:04-Mar-1996, 27 y.o., female Today's Date: 09/07/2022   PT End of Session - 09/07/22 1114     Visit Number 1    Number of Visits 10    Date for PT Re-Evaluation 11/16/22    Authorization Type MCAID    PT Start Time 1113    PT Stop Time 1153    PT Time Calculation (min) 40 min             Past Medical History:  Diagnosis Date   Anemia, iron deficiency, inadequate dietary intake 12/01/2014   Depression    patient has not been officially diagnosed but has symptoms   Diabetes mellitus without complication ([place])    Recently changed to Glyburide and thinks it is helping   Essential hypertension, benign 03/31/2016   Irregular periods/menstrual cycles    Joint pain of leg    Major depression 11/23/2015   Sciatica    Past Surgical History:  Procedure Laterality Date   CHOLECYSTECTOMY N/A 05/19/2016   Procedure: LAPAROSCOPIC CHOLECYSTECTOMY WITH INTRAOPERATIVE CHOLANGIOGRAM;  Surgeon: Leonie Green, MD;  Location: [facility] ORS;  Service: General;  Laterality: N/A;   Patient Active Problem List   Diagnosis Date Noted   Diabetes mellitus without complication ([place]) [id number]   Preexisting diabetes complicating pregnancy, antepartum 07/27/2022   Unwanted fertility 07/27/2022   History of preterm delivery, currently pregnant 05/11/2022   History of pre-eclampsia in prior pregnancy, currently pregnant 05/11/2022   Supervision of high risk pregnancy, antepartum 04/28/2022   Recurrent major depressive disorder, in partial remission ([place]) 12/12/2018   Obesity in pregnancy 11/29/2017   Acanthosis nigricans 06/27/2017   Fatty liver 05/07/2016   Essential hypertension, benign 03/31/2016   Proteinuria 01/30/2016   Major depression 11/23/2015   Vitamin D deficiency 11/19/2015    PCP:   REFERRING PROVIDER: Marcelline Mates   REFERRING DIAG: Pelvic pain affecting pregnancy in third trimester,  antepartum   Rationale for Evaluation and Treatment Rehabilitation  THERAPY DIAG:  Sacrococcygeal disorders, not elsewhere classified  Other abnormalities of gait and mobility  ONSET DATE:   SUBJECTIVE:                                                                                                                                                                                           SUBJECTIVE STATEMENT: Pt 's due date for 2nd pregnancy is estimated for 10/15/22. Pt is 31 weeks today.  1) Pt experiences pubic pain with walking, rolling over, putting legs up.   8-9/10  2) B sciatic pain, mainly L: constant, worse at night sleeping  on L side > R. Stepping, laying down, stepping car.   8-9/10 radiates down to midthigh   PERTINENT HISTORY:    PAIN:  Are you having pain? Yes: see above   PRECAUTIONS: Pregnancy   WEIGHT BEARING RESTRICTIONS: No  FALLS:  Has patient fallen in last 6 months? No  LIVING ENVIRONMENT: Lives with: lives with their family Lives in: House/apartment Stairs: no   OCCUPATION:  Landscape architect   PLOF: Independent  PATIENT GOALS:  Help pain    OBJECTIVE:   [facility] PT Assessment - 09/07/22 1403       Strength   Overall Strength Comments 3+/5 BLE due to pain seated      Palpation   SI assessment  L iliac crest higher, R shoulder lower    Palpation comment tenderness at L iliac crest, SIJ      Ambulation/Gait   Gait Comments preTx: 0.71 m/s post Tx: 0.89 m/s ( adducted knees need to be addressed next session)             [facility] PT Treatment/Exercise - 09/07/22 1403       Therapeutic Activites    Other Therapeutic Activities provided SIJ belt, educated on don/dof belt , explained about sleeping position with pillow between keness to minimzie pelvic obliquities      Neuro Re-ed    Neuro Re-ed Details  cued for sit to stand and HEP to realign spine/ pelvic girdle      Manual Therapy   Manual therapy comments STM/MWM along thoracic  and medial scapula L to realign spine/ pelvis                HOME EXERCISE PROGRAM: See pt instruction section    ASSESSMENT:  CLINICAL IMPRESSION:  Pt is [redacted] weeks pregnant with her 2nd child and experiencing radiating LBP and pubic pain.   Pt's musculoskeletal assessment revealed uneven pelvic girdle and shoulder height, asymmetries to gait pattern, limited spinal /pelvic mobility, genu valgus/ adducted hips, dyscoordination and strength of pelvic floor mm, hip weakness, poor body mechanics which places strain on the abdominal/pelvic floor mm. These are deficits that indicate an ineffective intraabdominal pressure system associated with increased risk for pt's Sx. Pt's sleeping position in sidelying without pilow between knees have been addressed to promote deviations to pelvic alignment.    Following Tx today which pt tolerated without complaints, pt demo'd equal alignment of pelvic girdle and increased spinal mobility.Pt voiced compliance to do so. Pt also was provided SIJ belt to increase pelvic girdle stability which pt voiced to be helpful.  Pt was educated on use of BUE for sitting from reclined position to minimize straining pelvic floor and back.    Anticipate pelvic realignment will help with increasing gait speed. Plan to address hips and minimize genu valgus.   Pt benefits from skilled PT.    OBJECTIVE IMPAIRMENTS decreased activity tolerance, decreased coordination, decreased endurance, decreased mobility, difficulty walking, decreased ROM, decreased strength, decreased safety awareness, hypomobility, increased muscle spasms, impaired flexibility, improper body mechanics, postural dysfunction, and pain.    ACTIVITY LIMITATIONS  self-care,  sleep, home chores, work tasks    PARTICIPATION LIMITATIONS:  community, work   PERSONAL FACTORS   2nd pregnancy   are also affecting patient's functional outcome.    REHAB POTENTIAL: Good   CLINICAL DECISION MAKING:  Evolving/moderate complexity   EVALUATION COMPLEXITY: Moderate    PATIENT EDUCATION:    Education details: Showed pt anatomy images. Explained muscles attachments/ connection, physiology of  deep core system/ spinal- thoracic-pelvis-lower kinetic chain as they relate to pt's presentation, Sx, and past Hx. Explained what and how these areas of deficits need to be restored to balance and function    See Therapeutic activity / neuromuscular re-education section  Answered pt's questions.   Person educated: Patient Education method: Explanation, Demonstration, Tactile cues, Verbal cues, and Handouts Education comprehension: verbalized understanding, returned demonstration, verbal cues required, tactile cues required, and needs further education     PLAN: PT FREQUENCY: 1x/week   PT DURATION: 10 weeks   PLANNED INTERVENTIONS: Therapeutic exercises, Therapeutic activity, Neuromuscular re-education, Balance training, Gait training, Patient/Family education, Self Care, Joint mobilization, Spinal mobilization, Moist heat, Taping, and Manual therapy, dry needling.   PLAN FOR NEXT SESSION: See clinical impression for plan     GOALS: Goals reviewed with patient? Yes                 SHORT TERM GOALS: Target date:  10/05/2022    Pt will demo IND with HEP                    Baseline: Not IND            Goal status: INITIAL   LONG TERM GOALS: Target date: 11/16/2022    1.Pt will demo proper deep core coordination without chest breathing and optimal excursion of diaphragm/pelvic floor in order to promote spinal stability and pelvic floor function  Baseline: dyscoordination Goal status: INITIAL  2.  Pt will demo > 5 pt change on FOTO  to improve QOL and function    Lumber baseline  - ( take next session)  Higher score = better function   Goal status: INITIAL  3.  Pt will demo proper body mechanics in against gravity tasks and ADLs  work tasks, fitness  to minimize straining pelvic  floor / back                  Baseline: not IND, improper form that places strain on pelvic floor                Goal status: INITIAL    4. Pt will demo levelled pelvic girdle and shoulder height in order to progress to deep core strengthening HEP and restore mobility at spine, pelvis, gait, posture   Baseline: demo L iliac crest , L shoulder higher)  Goal status: INITIAL    5. Pt will demo increased gait speed > 1.3 m/s in order to ambulate safely in community and return to fitness routine  Baseline: 0.7 m/s , antalgic gait   Goal status: INITIAL   6. Pt will be compliant with sleeping with pillow between knees to minimzie pelvic obliquities  Baseline: not with pillow with knees  Goal status: INITIAL    Jerl Mina, PT 09/07/2022, 11:25 AM

## 2022-09-09 ENCOUNTER — Ambulatory Visit: Payer: BC Managed Care – PPO | Admitting: Physical Therapy

## 2022-09-12 ENCOUNTER — Other Ambulatory Visit: Payer: Self-pay

## 2022-09-12 DIAGNOSIS — O24312 Unspecified pre-existing diabetes mellitus in pregnancy, second trimester: Secondary | ICD-10-CM

## 2022-09-12 DIAGNOSIS — E119 Type 2 diabetes mellitus without complications: Secondary | ICD-10-CM

## 2022-09-12 DIAGNOSIS — O09899 Supervision of other high risk pregnancies, unspecified trimester: Secondary | ICD-10-CM

## 2022-09-12 DIAGNOSIS — O09299 Supervision of pregnancy with other poor reproductive or obstetric history, unspecified trimester: Secondary | ICD-10-CM

## 2022-09-12 DIAGNOSIS — F3341 Major depressive disorder, recurrent, in partial remission: Secondary | ICD-10-CM

## 2022-09-12 MED ORDER — ACCU-CHEK GUIDE VI STRP: ORAL_STRIP | 12 refills | Status: DC

## 2022-09-12 MED ORDER — ACCU-CHEK SOFTCLIX LANCETS MISC: 12 refills | Status: DC

## 2022-09-12 NOTE — Telephone Encounter (Signed)
Spoke with patient, resent rx as requested. Advised culture at next OB appt on 4/3

## 2022-09-14 ENCOUNTER — Other Ambulatory Visit (HOSPITAL_COMMUNITY)
Admission: RE | Admit: 2022-09-14 | Discharge: 2022-09-14 | Disposition: A | Discharge status: Home / Self Care | Payer: BC Managed Care – PPO | Source: Ambulatory Visit | Attending: Licensed Practical Nurse | Admitting: Licensed Practical Nurse

## 2022-09-14 ENCOUNTER — Ambulatory Visit: Payer: BC Managed Care – PPO | Attending: Maternal & Fetal Medicine

## 2022-09-14 ENCOUNTER — Encounter: Payer: Self-pay | Admitting: Licensed Practical Nurse

## 2022-09-14 ENCOUNTER — Other Ambulatory Visit: Payer: BC Managed Care – PPO

## 2022-09-14 ENCOUNTER — Ambulatory Visit (INDEPENDENT_AMBULATORY_CARE_PROVIDER_SITE_OTHER): Payer: BC Managed Care – PPO | Admitting: Licensed Practical Nurse

## 2022-09-14 ENCOUNTER — Other Ambulatory Visit: Payer: Self-pay

## 2022-09-14 VITALS — BP 126/85 | HR 127 | Wt 249.6 lb

## 2022-09-14 VITALS — BP 125/81 | HR 118 | Temp 97.7°F | Ht 68.0 in | Wt 249.5 lb

## 2022-09-14 DIAGNOSIS — O3663X0 Maternal care for excessive fetal growth, third trimester, not applicable or unspecified: Secondary | ICD-10-CM

## 2022-09-14 DIAGNOSIS — Z3A00 Weeks of gestation of pregnancy not specified: Secondary | ICD-10-CM | POA: Insufficient documentation

## 2022-09-14 DIAGNOSIS — O99213 Obesity complicating pregnancy, third trimester: Secondary | ICD-10-CM

## 2022-09-14 DIAGNOSIS — Z3A32 32 weeks gestation of pregnancy: Secondary | ICD-10-CM

## 2022-09-14 DIAGNOSIS — O09293 Supervision of pregnancy with other poor reproductive or obstetric history, third trimester: Secondary | ICD-10-CM

## 2022-09-14 DIAGNOSIS — E119 Type 2 diabetes mellitus without complications: Secondary | ICD-10-CM

## 2022-09-14 DIAGNOSIS — Z362 Encounter for other antenatal screening follow-up: Secondary | ICD-10-CM | POA: Diagnosis not present

## 2022-09-14 DIAGNOSIS — O24113 Pre-existing diabetes mellitus, type 2, in pregnancy, third trimester: Secondary | ICD-10-CM | POA: Diagnosis present

## 2022-09-14 DIAGNOSIS — Z7984 Long term (current) use of oral hypoglycemic drugs: Secondary | ICD-10-CM

## 2022-09-14 DIAGNOSIS — O403XX0 Polyhydramnios, third trimester, not applicable or unspecified: Secondary | ICD-10-CM | POA: Diagnosis not present

## 2022-09-14 DIAGNOSIS — N949 Unspecified condition associated with female genital organs and menstrual cycle: Secondary | ICD-10-CM

## 2022-09-14 DIAGNOSIS — O0990 Supervision of high risk pregnancy, unspecified, unspecified trimester: Secondary | ICD-10-CM | POA: Diagnosis present

## 2022-09-14 DIAGNOSIS — O10013 Pre-existing essential hypertension complicating pregnancy, third trimester: Secondary | ICD-10-CM | POA: Insufficient documentation

## 2022-09-14 DIAGNOSIS — O99891 Other specified diseases and conditions complicating pregnancy: Secondary | ICD-10-CM

## 2022-09-14 DIAGNOSIS — F3341 Major depressive disorder, recurrent, in partial remission: Secondary | ICD-10-CM

## 2022-09-14 DIAGNOSIS — F32A Depression, unspecified: Secondary | ICD-10-CM

## 2022-09-14 DIAGNOSIS — E669 Obesity, unspecified: Secondary | ICD-10-CM | POA: Diagnosis not present

## 2022-09-14 DIAGNOSIS — O09899 Supervision of other high risk pregnancies, unspecified trimester: Secondary | ICD-10-CM

## 2022-09-14 DIAGNOSIS — O09299 Supervision of pregnancy with other poor reproductive or obstetric history, unspecified trimester: Secondary | ICD-10-CM

## 2022-09-14 DIAGNOSIS — Z1332 Encounter for screening for maternal depression: Secondary | ICD-10-CM

## 2022-09-14 LAB — POCT URINALYSIS DIPSTICK
Bilirubin, UA: NEGATIVE
Blood, UA: NEGATIVE
Glucose, UA: NEGATIVE
Leukocytes, UA: NEGATIVE
Nitrite, UA: NEGATIVE
Protein, UA: POSITIVE — AB
Spec Grav, UA: 1.015 (ref 1.010–1.025)
Urobilinogen, UA: 0.2 E.U./dL
pH, UA: 7 (ref 5.0–8.0)

## 2022-09-14 NOTE — Progress Notes (Signed)
error 

## 2022-09-14 NOTE — Progress Notes (Unsigned)
Routine Prenatal Care Visit  Subjective  Penny Gonzalez is a 27 y.o. [phone] at [redacted]w[redacted]d being seen today for ongoing prenatal care.  She is currently monitored for the following issues for this high-risk pregnancy and has Vitamin D deficiency; Major depression; Proteinuria; Essential hypertension, benign; Fatty liver; Acanthosis nigricans; Obesity in pregnancy; Recurrent major depressive disorder, in partial remission; Supervision of high risk pregnancy, antepartum; History of preterm delivery, currently pregnant; History of pre-eclampsia in prior pregnancy, currently pregnant; Preexisting diabetes complicating pregnancy, antepartum; Unwanted fertility; and Diabetes mellitus without complication on their problem list.  ----------------------------------------------------------------------------------- Patient reports Pt miserable. Has been in a lot of pain, mainly her pelvis, hips and sciatic nerve. She has recently started PT, but has only had 1 session. She feels that she is not being heard as she is in pain and does not want to take Narcotics and nothing seems to be helping, she also has had difficulty breathing especially when reclined, pt seems most interested in delivery ASAP. Long discussion on the importance of getting as close to term as possible. Of course if there were something "medical" to occur we would rec early delivery. As it is she can anticipate  an IOL at 37 wks for uncontrolled diabetes. Encouraged pt to continue with PT, using support belt, she can add massage and arnica or icy hot cream.  -has had vaginal itching/burning for sometime but it got worse over the last day or so, she does see an increase in white discharge. Pt's labia irritated looking, white discharge that is adherent to the labia present, aptima swab blinding collected. Pt has a Diflucan tablet at home, may take prior to results if she desires.  -T2DM-increased her glyburide, Ran out of lancets so has not checked her  sugars in a few days. Now has lancets reports her fasting's are in the 130's, PP are 170's but did have a 114 at 1305 today.  Reviewed readings with Dr Amalia Hailey. Discussed with pt she probably needs insulin but we can try one more week of increasing Glyburide. Pt seems frustrated with needing to adjust medication and/or switching to insulin. Has seen a nutritionist  and was told her eating habits are good, pt does eat farely well, reviewed with pt this is not a what you eat problem per se, your blood sugars not well controlled most likely because you are not on right medication or dose. Anticipate need to switch to insulin.  -Pt tearful today, mostly out of frustration. EPDS 18, desires referral to therapist.   Contractions: Irritability. Vag. Bleeding: None.  Movement: Present. Leaking Fluid denies.  ----------------------------------------------------------------------------------- The following portions of the patient's history were reviewed and updated as appropriate: allergies, current medications, past family history, past medical history, past social history, past surgical history and problem list. Problem list updated.  Objective  Blood pressure 126/85, pulse (!) 127, weight 249 lb 9.6 oz (113.2 kg), last menstrual period 01/29/2022. Pregravid weight 214 lb (97.1 kg) Total Weight Gain 35 lb 9.6 oz (16.1 kg) Urinalysis: Urine Protein    Urine Glucose    Fetal Status: Fetal Heart Rate (bpm): 140 Fundal Height: 36 cm Movement: Present     General:  Alert, oriented and cooperative. Patient is in no acute distress.  Skin: Skin is warm and dry. No rash noted.   Cardiovascular: Normal heart rate noted  Respiratory: Normal respiratory effort, no problems with respiration noted  Abdomen: Soft, gravid, appropriate for gestational age. Pain/Pressure: Present     Pelvic:  Cervical  exam deferred        Extremities: Normal range of motion.     Mental Status: Normal mood and affect. Normal behavior.  Normal judgment and thought content.   Assessment   27 y.o. [id number] at [redacted]w[redacted]d by  11/05/2022, by Last Menstrual Period presenting for routine prenatal visit  Plan   four Problems (from 04/28/22 to present)     Problem Noted Resolved   Supervision of high risk pregnancy, antepartum 04/28/2022 by Cleophas Dunker, CMA No   Overview Addendum 09/01/2022  4:36 PM by Chilton Greathouse, CMA     Clinical Staff Provider  Office Location  [facility] Dating  Not found.  Language  English Anatomy US    Flu Vaccine  offer Genetic Screen  NIPS:   TDaP vaccine   08/19/2022 Hgb A1C or  GTT Early : Third trimester :   Covid declined   LAB RESULTS   Rhogam  --/--/O POS Performed at [facility], [address] [place], [place] [address]  [id number])  Blood Type O+  Feeding Plan breast Antibody  Negative  Contraception tubal Rubella  4.09  Circumcision yes RPR Non Reactive (03/08 1209)   Pediatrician  undecided HBsAg   Negative  Support Person Elberta Fortis HIV  Negative  Prenatal Classes yes Varicella 212    GBS  (For PCN allergy, check sensitivities)   BTL Consent Signed 07/27/22 Hep C   Non Reactive  VBAC Consent  Pap Diagnosis  Date Value Ref Range Status  11/28/2018   Final   NEGATIVE FOR INTRAEPITHELIAL LESIONS OR MALIGNANCY.  11/28/2018   Final   FUNGAL ORGANISMS PRESENT CONSISTENT WITH CANDIDA SPP.      Hgb Electro      CF      SMA                   Preterm labor symptoms and general obstetric precautions including but not limited to vaginal bleeding, contractions, leaking of fluid and fetal movement were reviewed in detail with the patient. Please refer to After Visit Summary for other counseling recommendations.   Return in about 1 week (around 09/21/2022) for [place] with MD.  Will increase Glyburide to 15mg  at night and 10mg  in the morning   Referral to psychology placed  Aptima swab sent  MFM fu scheduled for later today   [place], [facility]  09/14/22  3:31 PM

## 2022-09-15 ENCOUNTER — Other Ambulatory Visit: Payer: BC Managed Care – PPO

## 2022-09-15 ENCOUNTER — Telehealth: Payer: Self-pay

## 2022-09-15 NOTE — Telephone Encounter (Signed)
Pt called triage had questions about the protein in her urine. I advised pt not to worry sometimes there is protein in urine in pregnancy aslong as she did not have High BP readings no worries. Pt states she had an ultrasound yesterday and they said her baby is big and she may have the baby before 37 weeks. She wanted a note to be out of work closer to that 36 weeks, Pt aware to discuss this with the Dr at her next appointment.

## 2022-09-16 ENCOUNTER — Encounter: Payer: Self-pay | Admitting: Obstetrics and Gynecology

## 2022-09-16 ENCOUNTER — Ambulatory Visit: Payer: BC Managed Care – PPO | Attending: Obstetrics and Gynecology | Admitting: Physical Therapy

## 2022-09-16 ENCOUNTER — Other Ambulatory Visit: Payer: Self-pay

## 2022-09-16 ENCOUNTER — Observation Stay
Admission: EM | Admit: 2022-09-16 | Discharge: 2022-09-17 | Disposition: A | Discharge status: Home / Self Care | Payer: BC Managed Care – PPO | Attending: Obstetrics | Admitting: Obstetrics

## 2022-09-16 DIAGNOSIS — E119 Type 2 diabetes mellitus without complications: Secondary | ICD-10-CM | POA: Diagnosis not present

## 2022-09-16 DIAGNOSIS — Z794 Long term (current) use of insulin: Secondary | ICD-10-CM

## 2022-09-16 DIAGNOSIS — O4703 False labor before 37 completed weeks of gestation, third trimester: Secondary | ICD-10-CM | POA: Diagnosis not present

## 2022-09-16 DIAGNOSIS — R2689 Other abnormalities of gait and mobility: Secondary | ICD-10-CM | POA: Insufficient documentation

## 2022-09-16 DIAGNOSIS — O0990 Supervision of high risk pregnancy, unspecified, unspecified trimester: Secondary | ICD-10-CM

## 2022-09-16 DIAGNOSIS — Z3A32 32 weeks gestation of pregnancy: Secondary | ICD-10-CM | POA: Diagnosis not present

## 2022-09-16 DIAGNOSIS — R102 Pelvic and perineal pain: Secondary | ICD-10-CM | POA: Diagnosis not present

## 2022-09-16 DIAGNOSIS — O24113 Pre-existing diabetes mellitus, type 2, in pregnancy, third trimester: Secondary | ICD-10-CM | POA: Diagnosis not present

## 2022-09-16 DIAGNOSIS — O26893 Other specified pregnancy related conditions, third trimester: Principal | ICD-10-CM

## 2022-09-16 DIAGNOSIS — O10013 Pre-existing essential hypertension complicating pregnancy, third trimester: Secondary | ICD-10-CM | POA: Diagnosis not present

## 2022-09-16 DIAGNOSIS — M533 Sacrococcygeal disorders, not elsewhere classified: Secondary | ICD-10-CM | POA: Insufficient documentation

## 2022-09-16 LAB — URINALYSIS, ROUTINE W REFLEX MICROSCOPIC
Bilirubin Urine: NEGATIVE
Glucose, UA: >=500 mg/dL — AB
Hgb urine dipstick: NEGATIVE
Ketones, ur: 20 mg/dL — AB
Leukocytes,Ua: NEGATIVE
Nitrite: NEGATIVE
Protein, ur: 30 mg/dL — AB
Specific Gravity, Urine: 1.031 — ABNORMAL HIGH (ref 1.005–1.030)
pH: 5 (ref 5.0–8.0)

## 2022-09-16 LAB — COMPREHENSIVE METABOLIC PANEL
ALT: 12 U/L (ref 0–44)
AST: 25 U/L (ref 15–41)
Albumin: 3 g/dL — ABNORMAL LOW (ref 3.5–5.0)
Alkaline Phosphatase: 113 U/L (ref 38–126)
Anion gap: 11 (ref 5–15)
BUN: 7 mg/dL (ref 6–20)
CO2: 21 mmol/L — ABNORMAL LOW (ref 22–32)
Calcium: 8.9 mg/dL (ref 8.9–10.3)
Chloride: 99 mmol/L (ref 98–111)
Creatinine, Ser: 0.65 mg/dL (ref 0.44–1.00)
GFR, Estimated: >60 mL/min (ref >60)
Glucose, Bld: 282 mg/dL — ABNORMAL HIGH (ref 70–99)
Potassium: 4 mmol/L (ref 3.5–5.1)
Sodium: 131 mmol/L — ABNORMAL LOW (ref 135–145)
Total Bilirubin: 0.9 mg/dL (ref 0.3–1.2)
Total Protein: 5.9 g/dL — ABNORMAL LOW (ref 6.5–8.1)

## 2022-09-16 LAB — CERVICOVAGINAL ANCILLARY ONLY
Bacterial Vaginitis (gardnerella): NEGATIVE
Candida Glabrata: NEGATIVE
Candida Vaginitis: POSITIVE — AB
Chlamydia: NEGATIVE
Comment: NEGATIVE
Comment: NEGATIVE
Comment: NEGATIVE
Comment: NEGATIVE
Comment: NORMAL
Neisseria Gonorrhea: NEGATIVE

## 2022-09-16 LAB — RUPTURE OF MEMBRANE (ROM)PLUS: Rom Plus: NEGATIVE

## 2022-09-16 LAB — CBC
HCT: 31.7 % — ABNORMAL LOW (ref 36.0–46.0)
Hemoglobin: 10.6 g/dL — ABNORMAL LOW (ref 12.0–15.0)
MCH: 28.2 pg (ref 26.0–34.0)
MCHC: 33.4 g/dL (ref 30.0–36.0)
MCV: 84.3 fL (ref 80.0–100.0)
Platelets: 225 10*3/uL (ref 150–400)
RBC: 3.76 MIL/uL — ABNORMAL LOW (ref 3.87–5.11)
RDW: 14.6 % (ref 11.5–15.5)
WBC: 6.8 10*3/uL (ref 4.0–10.5)
nRBC: 0 % (ref 0.0–0.2)

## 2022-09-16 LAB — URINE CULTURE

## 2022-09-16 LAB — PROTEIN / CREATININE RATIO, URINE
Creatinine, Urine: 133 mg/dL
Protein Creatinine Ratio: 0.16 mg/mg{Cre} — ABNORMAL HIGH (ref 0.00–0.15)
Total Protein, Urine: 21 mg/dL

## 2022-09-16 LAB — GLUCOSE, CAPILLARY: Glucose-Capillary: 169 mg/dL — ABNORMAL HIGH (ref 70–99)

## 2022-09-16 MED ORDER — INSULIN ASPART 100 UNIT/ML IJ SOLN
8.0000 [IU] | Freq: Every day | INTRAMUSCULAR | Status: DC
  Administered 2022-09-17: 8 [IU] via SUBCUTANEOUS
  Filled 2022-09-16 (×2): qty 1

## 2022-09-16 MED ORDER — LACTATED RINGERS IV SOLN: INTRAVENOUS | Status: DC

## 2022-09-16 MED ORDER — INSULIN ASPART 100 UNIT/ML IV SOLN
4.0000 [IU] | Freq: Once | INTRAVENOUS | Status: DC
  Filled 2022-09-16 (×2): qty 0.04

## 2022-09-16 MED ORDER — INSULIN ASPART 100 UNIT/ML IJ SOLN
3.0000 [IU] | INTRAMUSCULAR | Status: AC
  Administered 2022-09-16: 3 [IU] via SUBCUTANEOUS
  Filled 2022-09-16: qty 1

## 2022-09-16 MED ORDER — INSULIN NPH (HUMAN) (ISOPHANE) 100 UNIT/ML ~~LOC~~ SUSP
6.0000 [IU] | Freq: Two times a day (BID) | SUBCUTANEOUS | Status: DC
  Administered 2022-09-17: 6 [IU] via SUBCUTANEOUS
  Filled 2022-09-16: qty 10

## 2022-09-16 MED ORDER — LACTATED RINGERS IV SOLN
500.0000 mL | INTRAVENOUS | Status: DC | PRN
  Administered 2022-09-16: 1000 mL via INTRAVENOUS

## 2022-09-16 MED ORDER — ACETAMINOPHEN 325 MG PO TABS: 650.0000 mg | ORAL_TABLET | ORAL | Status: DC | PRN

## 2022-09-16 MED ORDER — INSULIN ASPART 100 UNIT/ML IJ SOLN
5.0000 [IU] | INTRAMUSCULAR | Status: AC
  Administered 2022-09-16: 5 [IU] via SUBCUTANEOUS
  Filled 2022-09-16: qty 1

## 2022-09-16 MED ORDER — INSULIN NPH (HUMAN) (ISOPHANE) 100 UNIT/ML ~~LOC~~ SUSP: 6.0000 [IU] | Freq: Every day | SUBCUTANEOUS | Status: DC

## 2022-09-16 MED ORDER — INSULIN ASPART 100 UNIT/ML IJ SOLN: 6.0000 [IU] | Freq: Every day | INTRAMUSCULAR | Status: DC

## 2022-09-16 MED ORDER — SOD CITRATE-CITRIC ACID 500-334 MG/5ML PO SOLN: 30.0000 mL | ORAL | Status: DC | PRN

## 2022-09-16 MED ORDER — INSULIN STARTER KIT- SYRINGES (ENGLISH)
1.0000 | Freq: Once | Status: AC
  Administered 2022-09-17: 1
  Filled 2022-09-16: qty 1

## 2022-09-16 MED ORDER — DOCUSATE SODIUM 100 MG PO CAPS
100.0000 mg | ORAL_CAPSULE | Freq: Two times a day (BID) | ORAL | Status: DC
  Administered 2022-09-16: 100 mg via ORAL
  Filled 2022-09-16: qty 1

## 2022-09-16 MED ORDER — INSULIN NPH (HUMAN) (ISOPHANE) 100 UNIT/ML ~~LOC~~ SUSP
6.0000 [IU] | SUBCUTANEOUS | Status: AC
  Administered 2022-09-16: 6 [IU] via SUBCUTANEOUS

## 2022-09-16 MED ORDER — MICONAZOLE NITRATE 100 MG VA SUPP
100.0000 mg | Freq: Every day | VAGINAL | Status: DC
  Administered 2022-09-16: 100 mg via VAGINAL
  Filled 2022-09-16: qty 7

## 2022-09-16 MED ORDER — ONDANSETRON HCL 4 MG/2ML IJ SOLN: 4.0000 mg | Freq: Four times a day (QID) | INTRAMUSCULAR | Status: DC | PRN

## 2022-09-16 NOTE — OB Triage Note (Addendum)
LABOR & DELIVERY OB TRIAGE NOTE  SUBJECTIVE  HPI Penny Gonzalez is a 27 y.o. [phone] at [redacted]w[redacted]d who presents to Labor & Delivery for headache and pelvic pain She reports that last night, she had ctx q 10 min. She has continued to have irregular contractions and cramping throughout the day. She states that her head is pounding. She has not taken any medication for it and declines Tylenol at this time but says lying in the dark helps. She also noted increased vaginal discharge. She took Diflucan for a yeast infection a few days ago. Her recent MFM BPP shows polyhydramnios with AFI of 29.77. She has a history of Type 2 diabetes, and blood glucose has been poorly controlled this pregnancy with glyburide. Her blood pressure have been WNL.  OB History     Gravida  4   Para  1   Term      Preterm  1   AB  2   Living  1      SAB      IAB  2   Ectopic      Multiple      Live Births  1           Scheduled Meds:  insulin aspart  4 Units Intravenous Once   Continuous Infusions:  lactated ringers 1,000 mL (09/16/22 1730)   lactated ringers     PRN Meds:.acetaminophen, lactated ringers, ondansetron, sodium citrate-citric acid  OBJECTIVE  BP 118/70 (BP Location: Right Arm)   Pulse (!) 112   Temp 98.5 F (36.9 C) (Oral)   Resp 18   Ht 5\' 8"  (1.727 m)   Wt 113.2 kg   LMP 01/29/2022 (Exact Date)   BMI 37.95 kg/m   Abdomen: soft, gravid, non-tender Cervical exam: Dilation: Closed Effacement (%): Thick Station: Ballotable Exam by:: Chryl Heck, CNM   NST I reviewed the NST and it was reactive.  Baseline: 150 Variability: moderate Accelerations: present Decelerations:none Toco: irritability, irregular ctx Category 1  ROM Plus: negative     Latest Ref Rng & Units 09/16/2022    3:46 PM 07/14/2022    6:37 PM 04/30/2022    9:24 PM  CMP  Glucose 70 - 99 mg/dL 588  502  774   BUN 6 - 20 mg/dL 7  <5  10   Creatinine 0.44 - 1.00 mg/dL 1.28  7.86  7.67   Sodium  135 - 145 mmol/L 131  135  136   Potassium 3.5 - 5.1 mmol/L 4.0  3.6  3.9   Chloride 98 - 111 mmol/L 99  106  105   CO2 22 - 32 mmol/L 21  21  24    Calcium 8.9 - 10.3 mg/dL 8.9  8.3  8.6   Total Protein 6.5 - 8.1 g/dL 5.9  6.1    Total Bilirubin 0.3 - 1.2 mg/dL 0.9  0.4    Alkaline Phos 38 - 126 U/L 113  54    AST 15 - 41 U/L 25  25    ALT 0 - 44 U/L 12  24            Latest Ref Rng & Units 09/16/2022    3:46 PM 08/19/2022   12:09 PM 07/14/2022    6:37 PM  CBC  WBC 4.0 - 10.5 K/uL 6.8  8.2  9.0   Hemoglobin 12.0 - 15.0 g/dL 20.9  47.0  96.2   Hematocrit 36.0 - 46.0 % 31.7  32.8  31.5  Platelets 150 - 400 K/uL 225  222  193     Protein/creatinine ratio pending  ASSESSMENT Impression  1) Pregnancy at [id number], [id number], Estimated Date of Delivery: 11/05/22 2) Uncontrolled diabetes 3) Uterine irritability with intact membranes  PLAN 1) Discussed findings with Dr. Logan BoresEvans. Will bolus 1 L IV fluids followed by 4U aspart insulin IV. Consider overnight stay to monitor glycemic control and provide diabetes education.   Guadlupe SpanishMelissa Nedra Mcinnis, CNM 09/16/22  6:11 PM

## 2022-09-16 NOTE — Patient Instructions (Signed)
Deep core level 1-2  Done in semi recline position not on your back   __   Transfer into bed:  Hook one foot behind other calf to position legs onto bed into sidelying        __     Strengthening:  sidelying or seated  with pillow between knees :   Inhale, relax pelvic floor, exhale: getting squeeze pillow with knees, lifting pelvic floor  To count of 3secs, then rest for 3 breaths, repeat 10 xreps x 2-3 sets/ day   * when seated : hands are by the hips on the chair or bed to lift chest for upright posture

## 2022-09-16 NOTE — Therapy (Addendum)
OUTPATIENT PHYSICAL THERAPY Treatment    Patient Name: Penny AmosSamantha B Ruhland MRN: [id number] DOB:05/26/1996, 27 y.o., female Today's Date: 09/16/2022   PT End of Session - 09/16/22 1257     Visit Number 2    Number of Visits 10    Date for PT Re-Evaluation 11/16/22    Authorization Type [employer]ID    PT Start Time 0900    PT Stop Time 0930    PT Time Calculation (min) 30 min             Past Medical History:  Diagnosis Date   Anemia, iron deficiency, inadequate dietary intake 12/01/2014   Depression    patient has not been officially diagnosed but has symptoms   Diabetes mellitus without complication    Recently changed to Glyburide and thinks it is helping   Essential hypertension, benign 03/31/2016   Irregular periods/menstrual cycles    Joint pain of leg    Major depression 11/23/2015   Sciatica    Past Surgical History:  Procedure Laterality Date   CHOLECYSTECTOMY N/A 05/19/2016   Procedure: LAPAROSCOPIC CHOLECYSTECTOMY WITH INTRAOPERATIVE CHOLANGIOGRAM;  Surgeon: Nadeen LandauJarvis Wilton Smith, MD;  Location: [facility] ORS;  Service: General;  Laterality: N/A;   Patient Active Problem List   Diagnosis Date Noted   Diabetes mellitus without complication 08/17/2022   Preexisting diabetes complicating pregnancy, antepartum 07/27/2022   Unwanted fertility 07/27/2022   History of preterm delivery, currently pregnant 05/11/2022   History of pre-eclampsia in prior pregnancy, currently pregnant 05/11/2022   Supervision of high risk pregnancy, antepartum 04/28/2022   Recurrent major depressive disorder, in partial remission 12/12/2018   Obesity in pregnancy 11/29/2017   Acanthosis nigricans 06/27/2017   Fatty liver 05/07/2016   Essential hypertension, benign 03/31/2016   Proteinuria 01/30/2016   Major depression 11/23/2015   Vitamin D deficiency 11/19/2015    PCP:   REFERRING PROVIDER: Valentino Saxonherry   REFERRING DIAG: Pelvic pain affecting pregnancy in third trimester, antepartum    Rationale for Evaluation and Treatment Rehabilitation  THERAPY DIAG:  Sacrococcygeal disorders, not elsewhere classified  Other abnormalities of gait and mobility  ONSET DATE:   SUBJECTIVE:                                                                                                                                                                                           SUBJECTIVE STATEMENT TODAY:  Pt had felt better after last session. She feels hip pain and pubic pain when in bed.    SUBJECTIVE STATEMENT on 09/07/22  EVAL: Pt 's due date for 2nd pregnancy is estimated for 10/15/22. Pt is 31 weeks today.  1)  Pt experiences pubic pain with walking, rolling over, putting legs up.   8-9/10  2) B sciatic pain, mainly L: constant, worse at night sleeping on L side > R. Stepping, laying down, stepping car.   8-9/10 radiates down to midthigh   PERTINENT HISTORY:    PAIN:  Are you having pain? Yes: see above   PRECAUTIONS: Pregnancy   WEIGHT BEARING RESTRICTIONS: No  FALLS:  Has patient fallen in last 6 months? No  LIVING ENVIRONMENT: Lives with: lives with their family Lives in: House/apartment Stairs: no   OCCUPATION:  Education officer, environmentalpersonal banker   PLOF: Independent  PATIENT GOALS:  Help pain    OBJECTIVE:     [facility] PT Assessment - 09/16/22 1258       Coordination   Coordination and Movement Description ab straining with deep core tyraining ( correct form post training)      Bed Mobility   Bed Mobility --   pain at pubic symphysis/, sit to sideyling (  less pain with cue to hook leg)            [facility] Adult PT Treatment/Exercise - 09/16/22 1258       Therapeutic Activites    Other Therapeutic Activities cued for bed mobility, provided water and almonds/ peanut butter as pt reported she had not eaten breakfast and her blood sugar was high when she checked in waiting area      Neuro Re-ed    Neuro Re-ed Details  cued for deep core , cued for less ab overuse ,  cued for adductor squeeze   Cued for less hyperextension of knee                HOME EXERCISE PROGRAM: See pt instruction section    ASSESSMENT:  CLINICAL IMPRESSION:  Pt was cued for bed mobility strategies to minimize pubic pain. Added adductor squeeze into HEP. Pt reported less pain with new technique for bed t/f.   Pt was provided water and almonds/ peanut butter as pt reported she had not eaten breakfast and her blood sugar was high when she checked in waiting area. Session was abbreviated due to pt checking her blood sugar.    Plan to address hips and minimize genu valgus. Plan to monitor pt's BP and ask pt about her blood sugar at every visit.  Pt benefits from skilled PT.    OBJECTIVE IMPAIRMENTS decreased activity tolerance, decreased coordination, decreased endurance, decreased mobility, difficulty walking, decreased ROM, decreased strength, decreased safety awareness, hypomobility, increased muscle spasms, impaired flexibility, improper body mechanics, postural dysfunction, and pain.    ACTIVITY LIMITATIONS  self-care,  sleep, home chores, work tasks    PARTICIPATION LIMITATIONS:  community, work   PERSONAL FACTORS   2nd pregnancy   are also affecting patient's functional outcome.    REHAB POTENTIAL: Good   CLINICAL DECISION MAKING: Evolving/moderate complexity   EVALUATION COMPLEXITY: Moderate    PATIENT EDUCATION:    Education details: Showed pt anatomy images. Explained muscles attachments/ connection, physiology of deep core system/ spinal- thoracic-pelvis-lower kinetic chain as they relate to pt's presentation, Sx, and past Hx. Explained what and how these areas of deficits need to be restored to balance and function    See Therapeutic activity / neuromuscular re-education section  Answered pt's questions.   Person educated: Patient Education method: Explanation, Demonstration, Tactile cues, Verbal cues, and Handouts Education comprehension:  verbalized understanding, returned demonstration, verbal cues required, tactile cues required, and needs further education  PLAN: PT FREQUENCY: 1x/week   PT DURATION: 10 weeks   PLANNED INTERVENTIONS: Therapeutic exercises, Therapeutic activity, Neuromuscular re-education, Balance training, Gait training, Patient/Family education, Self Care, Joint mobilization, Spinal mobilization, Moist heat, Taping, and Manual therapy, dry needling.   PLAN FOR NEXT SESSION: See clinical impression for plan     GOALS: Goals reviewed with patient? Yes                 SHORT TERM GOALS: Target date:  10/05/2022    Pt will demo IND with HEP                    Baseline: Not IND            Goal status: INITIAL   LONG TERM GOALS: Target date: 11/16/2022    1.Pt will demo proper deep core coordination without chest breathing and optimal excursion of diaphragm/pelvic floor in order to promote spinal stability and pelvic floor function  Baseline: dyscoordination Goal status: INITIAL  2.  Pt will demo > 5 pt change on FOTO  to improve QOL and function    Lumber baseline  - ( take next session)  Higher score = better function   Goal status: INITIAL  3.  Pt will demo proper body mechanics in against gravity tasks and ADLs  work tasks, fitness  to minimize straining pelvic floor / back                  Baseline: not IND, improper form that places strain on pelvic floor                Goal status: INITIAL    4. Pt will demo levelled pelvic girdle and shoulder height in order to progress to deep core strengthening HEP and restore mobility at spine, pelvis, gait, posture   Baseline: demo L iliac crest , L shoulder higher)  Goal status: INITIAL    5. Pt will demo increased gait speed > 1.3 m/s in order to ambulate safely in community and return to fitness routine  Baseline: 0.7 m/s , antalgic gait   Goal status: INITIAL   6. Pt will be compliant with sleeping with pillow between knees to  minimzie pelvic obliquities  Baseline: not with pillow with knees  Goal status: INITIAL    Mariane Masters, PT 09/16/2022, 1:09 PM

## 2022-09-16 NOTE — OB Triage Note (Signed)
G4P1 presents to L&D triage at [redacted]w[redacted]d w/ c/o leaking of fluids, contractions every 5-10 minutes, and headache. Pt reports she's been in pain for several weeks and is doing PT, but her headache, leaking and ctx's have been more severe for the past 2 days. Pt reports good fetal movement and denies vaginal bleeding. Pt recently diagnosed w/ yeast infection in office 2 days ago. Pt has a h/o DM2 insulin controlled, chronic HTN, and elevated BMI. Pt had preeclampsia w/ previous pregnancy. Monitors applied and assessing. Discussed pt w/ Chryl Heck CNM and plan includes PIH workup and ROM+. Reactive strip.

## 2022-09-17 DIAGNOSIS — O26893 Other specified pregnancy related conditions, third trimester: Secondary | ICD-10-CM | POA: Diagnosis not present

## 2022-09-17 LAB — GLUCOSE, CAPILLARY: Glucose-Capillary: 109 mg/dL — ABNORMAL HIGH (ref 70–99)

## 2022-09-17 MED ORDER — "INSULIN SYRINGE 30G X 5/16"" 0.3 ML MISC": 2.0000 [IU] | Freq: Two times a day (BID) | 3 refills | Status: DC

## 2022-09-17 MED ORDER — INSULIN NPH (HUMAN) (ISOPHANE) 100 UNIT/ML ~~LOC~~ SUSP: 16.0000 [IU] | Freq: Every day | SUBCUTANEOUS | 11 refills | Status: DC

## 2022-09-17 MED ORDER — INSULIN NPH (HUMAN) (ISOPHANE) 100 UNIT/ML ~~LOC~~ SUSP: 6.0000 [IU] | Freq: Every day | SUBCUTANEOUS | 11 refills | Status: DC

## 2022-09-17 MED ORDER — INSULIN ASPART 100 UNIT/ML IJ SOLN: 6.0000 [IU] | Freq: Every day | INTRAMUSCULAR | 11 refills | Status: DC

## 2022-09-17 MED ORDER — INSULIN ASPART 100 UNIT/ML IJ SOLN: 8.0000 [IU] | Freq: Every day | INTRAMUSCULAR | 11 refills | Status: DC

## 2022-09-17 MED ORDER — MICONAZOLE NITRATE 100 MG VA SUPP: 100.0000 mg | Freq: Every day | VAGINAL | 0 refills | Status: DC

## 2022-09-17 NOTE — Progress Notes (Signed)
Penny Gonzalez received aspart and NPH insulin yesterday evening, and her fasting blood sugar this morning is 109. She will received education on proper administration of insulin. She is in agreement with the plan although she notes "I have to get used to it." Rx for NHP and aspart insulin and syringes/needles sent to pharmacy per Dr. Sharlot Gowda rx. She has OB follow-up scheduled on 09/23/22. May discharge home once diabetes education is complete.  NST this morning was reactive/Category 1 with baseline of 135, moderate variability, accels present, and no decels. No contractions.   BP (!) 116/51 (BP Location: Right Arm)   Pulse 96   Temp 97.8 F (36.6 C) (Oral)   Resp 15   Ht 5\' 8"  (1.727 m)   Wt 113.2 kg   LMP 01/29/2022 (Exact Date)   BMI 37.95 kg/m    Penny Gonzalez, CNM

## 2022-09-17 NOTE — Inpatient Diabetes Management (Signed)
Inpatient Diabetes Program Recommendations  ADA Standards of Care 2021 Diabetes in Pregnancy Target Glucose Ranges:  Fasting: 60 - 90 mg/dL Preprandial: 60 - 063 mg/dL 1 hr postprandial: Less than 140mg /dL (from first bite of meal) 2 hr postprandial: Less than 120 mg/dL (from first bit of meal)    Lab Results  Component Value Date   GLUCAP 109 (H) 09/17/2022   HGBA1C 6.8 (H) 08/19/2022    Review of Glycemic Control  Latest Reference Range & Units 09/16/22 22:08 09/17/22 05:56  Glucose-Capillary 70 - 99 mg/dL 016 (H) 010 (H)  (H): Data is abnormally high  Diabetes history: pre-existing type 2 DM Outpatient Diabetes medications: Glyburide 15 QAM & 10 QPM Current orders for Inpatient glycemic control: NPH 6 units QAM and 16 QPM, Novolog 6 units TID with meals  [redacted] weeks gestation Will be new to insulin Has a follow up appointment with OB on 09/23/22  Inpatient Diabetes Program Recommendations:    NPH 16 units BID (goal BG < 90 fasting and < 120 2 hrs after meal) Agree with Novolog meal coverage TID  Called [facility] on [address] to be sure insulins will be affordable with her BC/BS.  NPH will be $35 and Aspart will be $25.  Spoke with patient on phone as the DM coordinator is working remotely over the weekend.  She states she has type 2 DM.  She is overwhelmed and would like to do the vial and syringe as this would be less injections than the insulin pens.  She has never used insulin before.  RN allowed her to administer her insulin independently.  RN explained how to mix the insulin (rapid insulin first followed by NPH).  I explained how and where to administer insulin.    Discussed importance on tight glucose control during pregnancy, hormonal fluctuations, insulin resistance as gestation increases, importance of close follow up.  Educated on hypoglycemia, signs, symptoms and treatments.  Goal CHO's per meal 35-45.    Will continue to follow while inpatient.  Thank you, Dulce Sellar, MSN, CDCES Diabetes Coordinator Inpatient Diabetes Program [phone] (team pager from 8a-5p)

## 2022-09-17 NOTE — OB Triage Note (Signed)
Discharge instructions, labor precautions, and follow-up care reviewed with patient and significant other. Instructions on how to take her blood sugar and insulin administration complete. All questions answered. Patient verbalized understanding. Discharged ambulatory off unit.

## 2022-09-20 ENCOUNTER — Encounter: Payer: Self-pay | Admitting: Obstetrics and Gynecology

## 2022-09-21 ENCOUNTER — Other Ambulatory Visit: Payer: Self-pay

## 2022-09-21 ENCOUNTER — Other Ambulatory Visit: Payer: BC Managed Care – PPO

## 2022-09-21 ENCOUNTER — Ambulatory Visit: Payer: BC Managed Care – PPO | Attending: Maternal & Fetal Medicine

## 2022-09-21 VITALS — BP 116/77 | HR 120 | Temp 97.5°F

## 2022-09-21 DIAGNOSIS — O10013 Pre-existing essential hypertension complicating pregnancy, third trimester: Secondary | ICD-10-CM | POA: Insufficient documentation

## 2022-09-21 DIAGNOSIS — I10 Essential (primary) hypertension: Secondary | ICD-10-CM

## 2022-09-21 DIAGNOSIS — O24414 Gestational diabetes mellitus in pregnancy, insulin controlled: Secondary | ICD-10-CM

## 2022-09-21 DIAGNOSIS — O3663X0 Maternal care for excessive fetal growth, third trimester, not applicable or unspecified: Secondary | ICD-10-CM | POA: Diagnosis not present

## 2022-09-21 DIAGNOSIS — O99213 Obesity complicating pregnancy, third trimester: Secondary | ICD-10-CM | POA: Insufficient documentation

## 2022-09-21 DIAGNOSIS — O99210 Obesity complicating pregnancy, unspecified trimester: Secondary | ICD-10-CM

## 2022-09-21 DIAGNOSIS — O403XX0 Polyhydramnios, third trimester, not applicable or unspecified: Secondary | ICD-10-CM | POA: Diagnosis not present

## 2022-09-21 DIAGNOSIS — E119 Type 2 diabetes mellitus without complications: Secondary | ICD-10-CM | POA: Diagnosis not present

## 2022-09-21 DIAGNOSIS — O24113 Pre-existing diabetes mellitus, type 2, in pregnancy, third trimester: Secondary | ICD-10-CM | POA: Insufficient documentation

## 2022-09-21 DIAGNOSIS — E669 Obesity, unspecified: Secondary | ICD-10-CM | POA: Insufficient documentation

## 2022-09-21 DIAGNOSIS — Z3A33 33 weeks gestation of pregnancy: Secondary | ICD-10-CM | POA: Insufficient documentation

## 2022-09-21 DIAGNOSIS — O09293 Supervision of pregnancy with other poor reproductive or obstetric history, third trimester: Secondary | ICD-10-CM | POA: Insufficient documentation

## 2022-09-21 DIAGNOSIS — Z794 Long term (current) use of insulin: Secondary | ICD-10-CM

## 2022-09-21 DIAGNOSIS — O09299 Supervision of pregnancy with other poor reproductive or obstetric history, unspecified trimester: Secondary | ICD-10-CM

## 2022-09-21 DIAGNOSIS — O0990 Supervision of high risk pregnancy, unspecified, unspecified trimester: Secondary | ICD-10-CM

## 2022-09-22 ENCOUNTER — Observation Stay
Admission: EM | Admit: 2022-09-22 | Discharge: 2022-09-23 | Disposition: A | Discharge status: Home / Self Care | Payer: BC Managed Care – PPO | Attending: Licensed Practical Nurse | Admitting: Licensed Practical Nurse

## 2022-09-22 ENCOUNTER — Other Ambulatory Visit: Payer: BC Managed Care – PPO

## 2022-09-22 ENCOUNTER — Other Ambulatory Visit: Payer: Self-pay

## 2022-09-22 ENCOUNTER — Encounter: Payer: Self-pay | Admitting: Obstetrics and Gynecology

## 2022-09-22 DIAGNOSIS — Z79899 Other long term (current) drug therapy: Secondary | ICD-10-CM | POA: Diagnosis not present

## 2022-09-22 DIAGNOSIS — O403XX0 Polyhydramnios, third trimester, not applicable or unspecified: Secondary | ICD-10-CM | POA: Diagnosis not present

## 2022-09-22 DIAGNOSIS — O24113 Pre-existing diabetes mellitus, type 2, in pregnancy, third trimester: Secondary | ICD-10-CM | POA: Insufficient documentation

## 2022-09-22 DIAGNOSIS — Z7982 Long term (current) use of aspirin: Secondary | ICD-10-CM | POA: Insufficient documentation

## 2022-09-22 DIAGNOSIS — O4703 False labor before 37 completed weeks of gestation, third trimester: Secondary | ICD-10-CM | POA: Diagnosis present

## 2022-09-22 DIAGNOSIS — O0990 Supervision of high risk pregnancy, unspecified, unspecified trimester: Secondary | ICD-10-CM

## 2022-09-22 DIAGNOSIS — Z794 Long term (current) use of insulin: Secondary | ICD-10-CM | POA: Insufficient documentation

## 2022-09-22 DIAGNOSIS — O10013 Pre-existing essential hypertension complicating pregnancy, third trimester: Secondary | ICD-10-CM | POA: Insufficient documentation

## 2022-09-22 DIAGNOSIS — Z91040 Latex allergy status: Secondary | ICD-10-CM | POA: Diagnosis not present

## 2022-09-22 DIAGNOSIS — Z3A33 33 weeks gestation of pregnancy: Secondary | ICD-10-CM | POA: Diagnosis not present

## 2022-09-22 DIAGNOSIS — O479 False labor, unspecified: Secondary | ICD-10-CM | POA: Diagnosis present

## 2022-09-23 ENCOUNTER — Encounter: Payer: BC Managed Care – PPO | Admitting: Obstetrics and Gynecology

## 2022-09-23 DIAGNOSIS — Z3A33 33 weeks gestation of pregnancy: Secondary | ICD-10-CM | POA: Diagnosis not present

## 2022-09-23 DIAGNOSIS — O403XX0 Polyhydramnios, third trimester, not applicable or unspecified: Secondary | ICD-10-CM | POA: Diagnosis not present

## 2022-09-23 DIAGNOSIS — O26893 Other specified pregnancy related conditions, third trimester: Secondary | ICD-10-CM

## 2022-09-23 DIAGNOSIS — O4703 False labor before 37 completed weeks of gestation, third trimester: Secondary | ICD-10-CM | POA: Diagnosis not present

## 2022-09-23 DIAGNOSIS — O479 False labor, unspecified: Secondary | ICD-10-CM | POA: Diagnosis present

## 2022-09-23 DIAGNOSIS — R109 Unspecified abdominal pain: Secondary | ICD-10-CM

## 2022-09-23 DIAGNOSIS — O24312 Unspecified pre-existing diabetes mellitus in pregnancy, second trimester: Secondary | ICD-10-CM

## 2022-09-23 DIAGNOSIS — O99210 Obesity complicating pregnancy, unspecified trimester: Secondary | ICD-10-CM

## 2022-09-23 DIAGNOSIS — O0990 Supervision of high risk pregnancy, unspecified, unspecified trimester: Secondary | ICD-10-CM

## 2022-09-23 LAB — URINALYSIS, COMPLETE (UACMP) WITH MICROSCOPIC
Bilirubin Urine: NEGATIVE
Glucose, UA: NEGATIVE mg/dL
Hgb urine dipstick: NEGATIVE
Ketones, ur: 5 mg/dL — AB
Nitrite: NEGATIVE
Protein, ur: 30 mg/dL — AB
Specific Gravity, Urine: 1.024 (ref 1.005–1.030)
pH: 5 (ref 5.0–8.0)

## 2022-09-23 LAB — CHLAMYDIA/NGC RT PCR (ARMC ONLY)
Chlamydia Tr: NOT DETECTED
N gonorrhoeae: NOT DETECTED

## 2022-09-23 LAB — PROTEIN / CREATININE RATIO, URINE
Creatinine, Urine: 148 mg/dL
Protein Creatinine Ratio: 0.2 mg/mg{Cre} — ABNORMAL HIGH (ref 0.00–0.15)
Total Protein, Urine: 30 mg/dL

## 2022-09-23 LAB — URINE DRUG SCREEN, QUALITATIVE (ARMC ONLY)
Amphetamines, Ur Screen: NOT DETECTED
Barbiturates, Ur Screen: NOT DETECTED
Benzodiazepine, Ur Scrn: NOT DETECTED
Cannabinoid 50 Ng, Ur ~~LOC~~: NOT DETECTED
Cocaine Metabolite,Ur ~~LOC~~: NOT DETECTED
MDMA (Ecstasy)Ur Screen: NOT DETECTED
Methadone Scn, Ur: NOT DETECTED
Opiate, Ur Screen: NOT DETECTED
Phencyclidine (PCP) Ur S: NOT DETECTED
Tricyclic, Ur Screen: NOT DETECTED

## 2022-09-23 LAB — WET PREP, GENITAL
Clue Cells Wet Prep HPF POC: NONE SEEN
Sperm: NONE SEEN
Trich, Wet Prep: NONE SEEN
WBC, Wet Prep HPF POC: >=10 — AB (ref <10)
Yeast Wet Prep HPF POC: NONE SEEN

## 2022-09-23 LAB — GROUP B STREP BY PCR: Group B strep by PCR: POSITIVE — AB

## 2022-09-23 LAB — GLUCOSE, CAPILLARY: Glucose-Capillary: 139 mg/dL — ABNORMAL HIGH (ref 70–99)

## 2022-09-23 MED ORDER — CALCIUM CARBONATE ANTACID 500 MG PO CHEW
2.0000 | CHEWABLE_TABLET | Freq: Once | ORAL | Status: AC
  Administered 2022-09-23: 400 mg via ORAL
  Filled 2022-09-23: qty 2

## 2022-09-23 MED ORDER — LACTATED RINGERS IV BOLUS
1000.0000 mL | Freq: Once | INTRAVENOUS | Status: AC
  Administered 2022-09-23: 1000 mL via INTRAVENOUS

## 2022-09-23 MED ORDER — ACETAMINOPHEN 500 MG PO TABS
1000.0000 mg | ORAL_TABLET | Freq: Four times a day (QID) | ORAL | Status: DC | PRN
  Administered 2022-09-23: 1000 mg via ORAL
  Filled 2022-09-23: qty 2

## 2022-09-23 MED ORDER — METOCLOPRAMIDE HCL 5 MG/ML IJ SOLN
10.0000 mg | Freq: Four times a day (QID) | INTRAMUSCULAR | Status: DC | PRN
  Administered 2022-09-23: 10 mg via INTRAVENOUS
  Filled 2022-09-23 (×2): qty 2

## 2022-09-23 NOTE — Progress Notes (Signed)
RN Called out: Pt given Reglan IV, soon after pt reported feeling dizzy with the shakes.  Pt in recliner, breathing without difficulty, appears sleepy but also unable to sit comfortably, reported feeling "weird".  VSS. Blood glucose 139. Assisted back to bed. IVF bolus continues to infuse.   Dr Logan Bores called, reviewed pt's complaints, assessment and vital signs.  Will keep pt in triage for few hours.  PT with RNST can remove monitors. Anticipate discharge later today   Carie Caddy, Ina Homes   [facility]  09/23/2022  2:21 AM  '

## 2022-09-23 NOTE — Progress Notes (Signed)
Obstetric H&P   Chief Complaint: contractions  Prenatal Care Provider: [facility]   History of Present Illness: 27 y.o. [id number] [redacted]w[redacted]d by 11/05/2022, by Last Menstrual Period presenting to L&D for contractions. Contractions started around 9pm, they are frequent and intense. Denies any LOF/VB. No recent IC. She is currently being treated for a yeast infection, denies abnormal discharge. She has had nausea over the last 2 days-she has not tried anything for it. Denies fever or diarrhea. She has had a headache for some time now. It is in the front of her head, it feels like throbbing and sometimes sharp. The light does bother her when she has the headache. She last took Tylenol 2 days age. She does have the headache currently.   Pregravid weight 97.1 kg Total Weight Gain 16.1 kg  four Problems (from 04/28/22 to present)     Problem Noted Resolved   Supervision of high risk pregnancy, antepartum 04/28/2022 by Loran Senters, CMA No   Overview Addendum 09/01/2022  4:36 PM by Tommie Raymond, CMA     Clinical Staff Provider  Office Location  [facility] Dating  Not found.  Language  English Anatomy US    Flu Vaccine  offer Genetic Screen  NIPS:   TDaP vaccine   08/19/2022 Hgb A1C or  GTT Early : Third trimester :   Covid declined   LAB RESULTS   Rhogam  --/--/O POS Performed at [facility], [address] [place], [place] [address]  [phone])  Blood Type O+  Feeding Plan breast Antibody  Negative  Contraception tubal Rubella  4.09  Circumcision yes RPR Non Reactive (03/08 1209)   Pediatrician  undecided HBsAg   Negative  Support Person Ethelene Browns HIV  Negative  Prenatal Classes yes Varicella 212    GBS  (For PCN allergy, check sensitivities)   BTL Consent Signed 07/27/22 Hep C   Non Reactive  VBAC Consent  Pap Diagnosis  Date Value Ref Range Status  11/28/2018   Final   NEGATIVE FOR INTRAEPITHELIAL LESIONS OR MALIGNANCY.  11/28/2018   Final   FUNGAL ORGANISMS  PRESENT CONSISTENT WITH CANDIDA SPP.      Hgb Electro      CF      SMA                   Review of Systems: 10 point review of systems negative unless otherwise noted in HPI  Past Medical History: Patient Active Problem List   Diagnosis Date Noted   Labor and delivery, indication for care 09/16/2022   Diabetes mellitus without complication 08/17/2022   Preexisting diabetes complicating pregnancy, antepartum 07/27/2022   Unwanted fertility 07/27/2022   History of preterm delivery, currently pregnant 05/11/2022   History of pre-eclampsia in prior pregnancy, currently pregnant 05/11/2022   Supervision of high risk pregnancy, antepartum 04/28/2022     Clinical Staff Provider  Office Location  [facility] Dating  Not found.  Language  English Anatomy US    Flu Vaccine  offer Genetic Screen  NIPS:   TDaP vaccine   08/19/2022 Hgb A1C or  GTT Early : Third trimester :   Covid declined   LAB RESULTS   Rhogam  --/--/O POS Performed at [facility], [address] [place], [place] [address]  [phone])  Blood Type O+  Feeding Plan breast Antibody  Negative  Contraception tubal Rubella  4.09  Circumcision yes RPR Non Reactive (03/08 1209)   Pediatrician  undecided HBsAg   Negative  Support Person Ethelene Browns HIV  Negative  Prenatal Classes yes Varicella 212    GBS  (For PCN allergy, check sensitivities)   BTL Consent Signed 07/27/22 Hep C   Non Reactive  VBAC Consent  Pap Diagnosis  Date Value Ref Range Status  11/28/2018   Final   NEGATIVE FOR INTRAEPITHELIAL LESIONS OR MALIGNANCY.  11/28/2018   Final   FUNGAL ORGANISMS PRESENT CONSISTENT WITH CANDIDA SPP.      Hgb Electro      CF      SMA            Recurrent major depressive disorder, in partial remission 12/12/2018   Obesity in pregnancy 11/29/2017   Acanthosis nigricans 06/27/2017   Fatty liver 05/07/2016   Essential hypertension, benign 03/31/2016   Proteinuria 01/30/2016   Major depression  11/23/2015   Vitamin D deficiency 11/19/2015    Past Surgical History: Past Surgical History:  Procedure Laterality Date   CHOLECYSTECTOMY N/A 05/19/2016   Procedure: LAPAROSCOPIC CHOLECYSTECTOMY WITH INTRAOPERATIVE CHOLANGIOGRAM;  Surgeon: Nadeen Landau, MD;  Location: [facility] ORS;  Service: General;  Laterality: N/A;    Past Obstetric History: # 1 - Date: 08/06/14, Sex: Female, Weight: 4054 g, GA: [redacted]w[redacted]d, Delivery: Vaginal, Spontaneous, Apgar1: None, Apgar5: None, Living: Living, Birth Comments: None  # 2 - Date: 01/04/20, Sex: None, Weight: None, GA: None, Delivery: Therapeutic Abortion, Apgar1: None, Apgar5: None, Living: None, Birth Comments: None  # 3 - Date: 07/31/21, Sex: None, Weight: None, GA: None, Delivery: Therapeutic Abortion, Apgar1: None, Apgar5: None, Living: None, Birth Comments: None  # 4 - Date: None, Sex: None, Weight: None, GA: None, Delivery: None, Apgar1: None, Apgar5: None, Living: None, Birth Comments: None   Past Gynecologic History:  Family History: Family History  Problem Relation Age of Onset   Diabetes Mother    Arthritis Father    Diabetes Father    Heart disease Father    Hypertension Father    Stroke Father    Lupus Sister    Deafness Sister    Diabetes Maternal Grandmother    Hypertension Maternal Grandmother    Heart disease Paternal Grandmother    Hypertension Paternal Grandmother     Social History: Social History   Socioeconomic History   Marital status: Single    Spouse name: Not on file   Number of children: 1   Years of education: 13   Highest education level: High school graduate  Occupational History   Occupation: [employer]  Tobacco Use   Smoking status: Never   Smokeless tobacco: Never  Vaping Use   Vaping Use: Never used  Substance and Sexual Activity   Alcohol use: Not Currently    Comment: OCC   Drug use: No   Sexual activity: Yes    Partners: Male    Comment: tubal  Other Topics Concern   Not on file   Social History Narrative   Not on file   Social Determinants of Health   Financial Resource Strain: Low Risk  (04/28/2022)   Overall Financial Resource Strain (CARDIA)    Difficulty of Paying Living Expenses: Not hard at all  Food Insecurity: No Food Insecurity (04/28/2022)   Hunger Vital Sign    Worried About Running Out of Food in the Last Year: Never true    Ran Out of Food in the Last Year: Never true  Transportation Needs: No Transportation Needs (04/28/2022)   PRAPARE - Transportation    Lack of Transportation (  Medical): No    Lack of Transportation (Non-Medical): No  Physical Activity: Inactive (04/28/2022)   Exercise Vital Sign    Days of Exercise per Week: 0 days    Minutes of Exercise per Session: 0 min  Stress: No Stress Concern Present (04/28/2022)   [employer] of Occupational Health - Occupational Stress Questionnaire    Feeling of Stress : Only a little  Social Connections: Moderately Integrated (04/28/2022)   Social Connection and Isolation Panel [NHANES]    Frequency of Communication with Friends and Family: More than three times a week    Frequency of Social Gatherings with Friends and Family: Once a week    Attends Religious Services: 1 to 4 times per year    Active Member of [employer] or Organizations: No    Attends Banker Meetings: Never    Marital Status: Living with partner  Intimate Partner Violence: Not At Risk (04/28/2022)   Humiliation, Afraid, Rape, and Kick questionnaire    Fear of Current or Ex-Partner: No    Emotionally Abused: No    Physically Abused: No    Sexually Abused: No    Medications: Prior to Admission medications   Medication Sig Start Date End Date Taking? Authorizing Provider  aspirin EC 81 MG tablet Take 1 tablet (81 mg total) by mouth daily. Swallow whole. 05/23/22  Yes Dove, Myra C, MD  ferrous sulfate (FERROUSUL) 325 (65 FE) MG tablet Take 1 tablet (325 mg total) by mouth 2 (two) times daily. 05/23/22  Yes  Dove, Myra C, MD  prenatal vitamin w/FE, FA (NATACHEW) 29-1 MG CHEW chewable tablet Chew 2 tablets by mouth daily at 12 noon.   Yes [provider]  Accu-Chek Softclix Lancets lancets Use to check blood sugar 4 times daily. 09/12/22   Hildred Laser, MD  blood glucose meter kit and supplies KIT Dispense based on patient and insurance preference. Use daily in the am before breakfast.  For Dx DM type 2, E11.69 Patient not taking: Reported on 09/05/2022 04/17/19   Danelle Berry, PA-C  Blood Glucose Monitoring Suppl (ACCU-CHEK GUIDE) w/Device KIT Use glucose meter to check blood sugar 4 times daily. 07/19/22   Warden Fillers, MD  glucose blood (ACCU-CHEK GUIDE) test strip Use 1 strip to check blood sugar 4 times daily 09/12/22   Hildred Laser, MD  insulin aspart (NOVOLOG) 100 UNIT/ML injection Inject 8 Units into the skin daily before breakfast. 09/17/22   Glenetta Borg, CNM  insulin aspart (NOVOLOG) 100 UNIT/ML injection Inject 6 Units into the skin daily before supper. 09/17/22   Glenetta Borg, CNM  insulin NPH Human (NOVOLIN N) 100 UNIT/ML injection Inject 0.16 mLs (16 Units total) into the skin daily before breakfast. 09/17/22   Glenetta Borg, CNM  insulin NPH Human (NOVOLIN N) 100 UNIT/ML injection Inject 0.06 mLs (6 Units total) into the skin daily before supper. 09/17/22   Glenetta Borg, CNM  Insulin Syringe-Needle U-100 (INSULIN SYRINGE .3CC/30GX5/16") 30G X 5/16" 0.3 ML MISC 2 Units by Does not apply route 2 (two) times daily. 09/17/22   Glenetta Borg, CNM  miconazole (MICOTIN) 100 MG vaginal suppository Place 1 suppository (100 mg total) vaginally at bedtime. 09/17/22   Glenetta Borg, CNM    Allergies: Allergies  Allergen Reactions   Latex Rash    Pruritic, urticaria    Physical Exam: Vitals: Last menstrual period 01/29/2022.  Urine Dip Protein: Urine dipstick shows positive for WBC's, positive for protein, positive for leukocytes,  and positive for ketones.      FHT: baseline 135, moderate variability, pos accel, neg decel  Toco: irritability with irregular contractions   General: moaning with contractions  HEENT: normocephalic, anicteric Pulmonary: No increased work of breathing Cardiovascular: RRR, distal pulses 2+ Abdomen: Gravid, non-tender  Genitourinary: WNL  VE FT/thick/high  Extremities: trace to +1  edema, erythema, or tenderness Neurologic: Grossly intact Psychiatric: mood appropriate, affect full  Labs: Results for orders placed or performed during the hospital encounter of 09/22/22 (from the past 24 hour(s))  Wet prep, genital     Status: Abnormal   Collection Time: 09/22/22 11:59 PM  Result Value Ref Range   Yeast Wet Prep HPF POC NONE SEEN NONE SEEN   Trich, Wet Prep NONE SEEN NONE SEEN   Clue Cells Wet Prep HPF POC NONE SEEN NONE SEEN   WBC, Wet Prep HPF POC >=10 (A) <10   Sperm NONE SEEN   Protein / creatinine ratio, urine     Status: Abnormal   Collection Time: 09/22/22 11:59 PM  Result Value Ref Range   Creatinine, Urine 148 mg/dL   Total Protein, Urine 30 mg/dL   Protein Creatinine Ratio 0.20 (H) 0.00 - 0.15 mg/mg[Cre]  Urinalysis, Complete w Microscopic -Urine, Clean Catch     Status: Abnormal   Collection Time: 09/22/22 11:59 PM  Result Value Ref Range   Color, Urine YELLOW (A) YELLOW   APPearance CLOUDY (A) CLEAR   Specific Gravity, Urine 1.024 1.005 - 1.030   pH 5.0 5.0 - 8.0   Glucose, UA NEGATIVE NEGATIVE mg/dL   Hgb urine dipstick NEGATIVE NEGATIVE   Bilirubin Urine NEGATIVE NEGATIVE   Ketones, ur 5 (A) NEGATIVE mg/dL   Protein, ur 30 (A) NEGATIVE mg/dL   Nitrite NEGATIVE NEGATIVE   Leukocytes,Ua TRACE (A) NEGATIVE   RBC / HPF 0-5 0 - 5 RBC/hpf   WBC, UA 11-20 0 - 5 WBC/hpf   Bacteria, UA MANY (A) NONE SEEN   Squamous Epithelial / HPF 6-10 0 - 5 /HPF   Mucus PRESENT     Assessment: 27 y.o. [id number] [redacted]w[redacted]d by 11/05/2022, by Last Menstrual Period with contractions and headache   Plan: 1)  Tylenol and Reglan ordered for Headache, IVF bolus.   2) Fetus -category 1 tracing   3) PNL - Blood type --/--/O POS Performed at [facility], [address] [place], [place] [address]  [phone]) / Anti-bodyscreen   / Ishmael Holter   / Varicella UNK / RPR Non Reactive (03/08 1209) / HBsAg Negative  / HIV Negative   / 1-hr OGTT  / GBS  pending   4) Immunization History -  Immunization History  Administered Date(s) Administered   HPV Quadrivalent 08/15/2008, 10/17/2008, 02/24/2009   Hepatitis A, Ped/Adol-2 Dose 08/15/2008   Meningococcal Conjugate 08/15/2008   Tdap 01/02/2011, 08/19/2022   Varicella 08/15/2008    5) Disposition - anticipate discharge later today   Carie Caddy CNM  [facility], [facility] 09/23/2022, 1:21 AM

## 2022-09-23 NOTE — Discharge Summary (Signed)
Physician Final Progress Note  Patient ID: Penny Gonzalez MRN: [id number] DOB/AGE: November 12, 1995 27 y.o.  Admit date: 09/22/2022 Admitting provider: Linzie Collin, MD Discharge date: 09/23/2022   Admission Diagnoses:  1) intrauterine pregnancy at [redacted]w[redacted]d  2) contractions    Discharge Diagnoses:  Principal Problem:   Uterine contractions Active Problems:   Polyhydramnios affecting pregnancy in third trimester  Not in labor   History of Present Illness: The patient is a 27 y.o. female [id number] at [redacted]w[redacted]d  presenting to L&D for contractions. Contractions started around 9pm, they are frequent and intense. Denies any LOF/VB. No recent IC. She is currently being treated for a yeast infection, denies abnormal discharge. She has had nausea over the last 2 days-she has not tried anything for it. Denies fever or diarrhea. She has had a headache for some time now. It is in the front of her head, it feels like throbbing and sometimes sharp. The light does bother her when she has the headache. She last took Tylenol 2 days age. She does have the headache currently.   Her daughter recently had vomiting and diarrhea Beonka has T2DM, she recently was started on insulin. Her NPH was increased to 10 units in the morning and evening. Her fasting's have been elevated and her  After meals have been in the 130's.   Past Medical History:  Diagnosis Date   Anemia, iron deficiency, inadequate dietary intake 12/01/2014   Depression    patient has not been officially diagnosed but has symptoms   Diabetes mellitus without complication    Recently changed to Glyburide and thinks it is helping   Essential hypertension, benign 03/31/2016   Irregular periods/menstrual cycles    Joint pain of leg    Major depression 11/23/2015   Sciatica     Past Surgical History:  Procedure Laterality Date   CHOLECYSTECTOMY N/A 05/19/2016   Procedure: LAPAROSCOPIC CHOLECYSTECTOMY WITH INTRAOPERATIVE CHOLANGIOGRAM;   Surgeon: Nadeen Landau, MD;  Location: [facility] ORS;  Service: General;  Laterality: N/A;    No current facility-administered medications on file prior to encounter.   Current Outpatient Medications on File Prior to Encounter  Medication Sig Dispense Refill   aspirin EC 81 MG tablet Take 1 tablet (81 mg total) by mouth daily. Swallow whole. 90 tablet 4   ferrous sulfate (FERROUSUL) 325 (65 FE) MG tablet Take 1 tablet (325 mg total) by mouth 2 (two) times daily. 60 tablet 1   prenatal vitamin w/FE, FA (NATACHEW) 29-1 MG CHEW chewable tablet Chew 2 tablets by mouth daily at 12 noon.     Accu-Chek Softclix Lancets lancets Use to check blood sugar 4 times daily. 100 each 12   blood glucose meter kit and supplies KIT Dispense based on patient and insurance preference. Use daily in the am before breakfast.  For Dx DM type 2, E11.69 (Patient not taking: Reported on 09/05/2022) 1 each 0   Blood Glucose Monitoring Suppl (ACCU-CHEK GUIDE) w/Device KIT Use glucose meter to check blood sugar 4 times daily. 1 kit 0   glucose blood (ACCU-CHEK GUIDE) test strip Use 1 strip to check blood sugar 4 times daily 100 each 12   insulin aspart (NOVOLOG) 100 UNIT/ML injection Inject 8 Units into the skin daily before breakfast. 10 mL 11   insulin aspart (NOVOLOG) 100 UNIT/ML injection Inject 6 Units into the skin daily before supper. 10 mL 11   insulin NPH Human (NOVOLIN N) 100 UNIT/ML injection Inject 0.16 mLs (16 Units total) into the  skin daily before breakfast. 10 mL 11   insulin NPH Human (NOVOLIN N) 100 UNIT/ML injection Inject 0.06 mLs (6 Units total) into the skin daily before supper. 10 mL 11   Insulin Syringe-Needle U-100 (INSULIN SYRINGE .3CC/30GX5/16") 30G X 5/16" 0.3 ML MISC 2 Units by Does not apply route 2 (two) times daily. 60 each 3   miconazole (MICOTIN) 100 MG vaginal suppository Place 1 suppository (100 mg total) vaginally at bedtime. 7 suppository 0    Allergies  Allergen Reactions   Latex Rash     Pruritic, urticaria    Social History   Socioeconomic History   Marital status: Single    Spouse name: Not on file   Number of children: 1   Years of education: 13   Highest education level: High school graduate  Occupational History   Occupation: [employer]  Tobacco Use   Smoking status: Never   Smokeless tobacco: Never  Vaping Use   Vaping Use: Never used  Substance and Sexual Activity   Alcohol use: Not Currently    Comment: OCC   Drug use: No   Sexual activity: Yes    Partners: Male    Comment: tubal  Other Topics Concern   Not on file  Social History Narrative   Not on file   Social Determinants of Health   Financial Resource Strain: Low Risk  (04/28/2022)   Overall Financial Resource Strain (CARDIA)    Difficulty of Paying Living Expenses: Not hard at all  Food Insecurity: No Food Insecurity (04/28/2022)   Hunger Vital Sign    Worried About Running Out of Food in the Last Year: Never true    Ran Out of Food in the Last Year: Never true  Transportation Needs: No Transportation Needs (04/28/2022)   PRAPARE - Administrator, Civil Service (Medical): No    Lack of Transportation (Non-Medical): No  Physical Activity: Inactive (04/28/2022)   Exercise Vital Sign    Days of Exercise per Week: 0 days    Minutes of Exercise per Session: 0 min  Stress: No Stress Concern Present (04/28/2022)   [employer] of Occupational Health - Occupational Stress Questionnaire    Feeling of Stress : Only a little  Social Connections: Moderately Integrated (04/28/2022)   Social Connection and Isolation Panel [NHANES]    Frequency of Communication with Friends and Family: More than three times a week    Frequency of Social Gatherings with Friends and Family: Once a week    Attends Religious Services: 1 to 4 times per year    Active Member of [employer] or Organizations: No    Attends Banker Meetings: Never    Marital Status: Living with partner   Intimate Partner Violence: Not At Risk (04/28/2022)   Humiliation, Afraid, Rape, and Kick questionnaire    Fear of Current or Ex-Partner: No    Emotionally Abused: No    Physically Abused: No    Sexually Abused: No    Family History  Problem Relation Age of Onset   Diabetes Mother    Arthritis Father    Diabetes Father    Heart disease Father    Hypertension Father    Stroke Father    Lupus Sister    Deafness Sister    Diabetes Maternal Grandmother    Hypertension Maternal Grandmother    Heart disease Paternal Grandmother    Hypertension Paternal Grandmother      Review of Systems  Constitutional: Negative.   Eyes: Negative.  Respiratory: Negative.    Cardiovascular: Negative.   Gastrointestinal:  Positive for abdominal pain, heartburn and nausea.  Musculoskeletal:  Positive for back pain.  Neurological: Negative.      Physical Exam: BP 101/62   Pulse (!) 116   LMP 01/29/2022 (Exact Date)   Physical Exam Constitutional:      Appearance: Normal appearance.  Genitourinary:     Vulva normal.  Pulmonary:     Effort: Pulmonary effort is normal.  Abdominal:     Tenderness: There is no abdominal tenderness.     Comments: Gravid   Musculoskeletal:     Right lower leg: No edema.     Left lower leg: No edema.  Neurological:     Mental Status: She is alert and oriented to person, place, and time.  Psychiatric:        Mood and Affect: Mood normal.   JYN:[id number] 135, moderate variability, pos accel, neg decel  TOCO: irregular contractions with irritability   Consults: None  Significant Findings/ Diagnostic Studies: UA trace leuks, protein, ketones.  Wet prep negative, gc/ct negative. GSB positive   Procedures: RNST  Hospital Course: The patient was admitted to Labor and Delivery Triage for observation. She had a RNST. She was given an IVF bolus. Tylenol and Reglan for a headache and nausea. She initially felt "weird" after being given Reglan, but that  sensation dissipated. The headache improved. She rested for some time, then she woke up d/t a contraction. Contractions have spaced out since arriving to the unit, but continue to occur about every , they palpate mild to moderate. Her cervix has remained unchanged at FT/thick/high. She reported feeling like she had an upset stomach (pt mentioned her daughter has been sick with vomiting and diarrhea). Tums was given for heartburn.  The pt's symptoms, assessment and labs were reviewed with Dr Logan Bores. Contractions most likely related to the polyhydramnios and normal third trimester discomforts. It is possible she has some GI upset and may develop diarrhea. Comfort measures reviewed. Pt had ROB scheduled for today at 8:15, per Dr Logan Bores, she does not need to keep that appointment.  Pt was discharged to her mother's house.   Discharge Condition: good  Disposition: Discharge disposition: 01-Home or Self Care       Diet: Diabetic diet  Discharge Activity: Activity as tolerated   Allergies as of 09/23/2022       Reactions   Latex Rash   Pruritic, urticaria        Medication List     TAKE these medications    Accu-Chek Guide test strip Generic drug: glucose blood Use 1 strip to check blood sugar 4 times daily   Accu-Chek Guide w/Device Kit Use glucose meter to check blood sugar 4 times daily.   Accu-Chek Softclix Lancets lancets Use to check blood sugar 4 times daily.   aspirin EC 81 MG tablet Take 1 tablet (81 mg total) by mouth daily. Swallow whole.   blood glucose meter kit and supplies Kit Dispense based on patient and insurance preference. Use daily in the am before breakfast.  For Dx DM type 2, E11.69   ferrous sulfate 325 (65 FE) MG tablet Commonly known as: FerrouSul Take 1 tablet (325 mg total) by mouth 2 (two) times daily.   insulin aspart 100 UNIT/ML injection Commonly known as: novoLOG Inject 8 Units into the skin daily before breakfast.   insulin aspart  100 UNIT/ML injection Commonly known as: novoLOG Inject 6 Units into the skin daily  before supper.   insulin NPH Human 100 UNIT/ML injection Commonly known as: NOVOLIN N Inject 0.16 mLs (16 Units total) into the skin daily before breakfast.   insulin NPH Human 100 UNIT/ML injection Commonly known as: NOVOLIN N Inject 0.06 mLs (6 Units total) into the skin daily before supper.   INSULIN SYRINGE .3CC/30GX5/16" 30G X 5/16" 0.3 ML Misc 2 Units by Does not apply route 2 (two) times daily.   miconazole 100 MG vaginal suppository Commonly known as: MICOTIN Place 1 suppository (100 mg total) vaginally at bedtime.   prenatal vitamin w/FE, FA 29-1 MG Chew chewable tablet Chew 2 tablets by mouth daily at 12 noon.         Total time spent taking care of this patient: 60 minutes  Signed: Ellouise Newer Terrius Gentile, CNM  09/23/2022, 7:00 AM

## 2022-09-24 LAB — URINE CULTURE: Culture: NO GROWTH

## 2022-09-26 ENCOUNTER — Other Ambulatory Visit: Payer: Self-pay

## 2022-09-26 DIAGNOSIS — F3341 Major depressive disorder, recurrent, in partial remission: Secondary | ICD-10-CM

## 2022-09-26 DIAGNOSIS — O403XX0 Polyhydramnios, third trimester, not applicable or unspecified: Secondary | ICD-10-CM

## 2022-09-26 DIAGNOSIS — O24414 Gestational diabetes mellitus in pregnancy, insulin controlled: Secondary | ICD-10-CM

## 2022-09-26 DIAGNOSIS — O99210 Obesity complicating pregnancy, unspecified trimester: Secondary | ICD-10-CM

## 2022-09-26 DIAGNOSIS — I10 Essential (primary) hypertension: Secondary | ICD-10-CM

## 2022-09-28 ENCOUNTER — Other Ambulatory Visit: Payer: Self-pay

## 2022-09-28 ENCOUNTER — Other Ambulatory Visit: Payer: BC Managed Care – PPO

## 2022-09-28 ENCOUNTER — Telehealth: Payer: Self-pay | Admitting: Medical

## 2022-09-28 ENCOUNTER — Ambulatory Visit: Payer: BC Managed Care – PPO | Attending: Obstetrics

## 2022-09-28 ENCOUNTER — Ambulatory Visit (INDEPENDENT_AMBULATORY_CARE_PROVIDER_SITE_OTHER): Payer: BC Managed Care – PPO | Admitting: Medical

## 2022-09-28 VITALS — BP 107/71 | HR 99 | Wt 249.0 lb

## 2022-09-28 VITALS — BP 111/82 | HR 102 | Temp 98.3°F | Ht 69.0 in | Wt 261.0 lb

## 2022-09-28 DIAGNOSIS — O99210 Obesity complicating pregnancy, unspecified trimester: Secondary | ICD-10-CM

## 2022-09-28 DIAGNOSIS — E119 Type 2 diabetes mellitus without complications: Secondary | ICD-10-CM | POA: Diagnosis not present

## 2022-09-28 DIAGNOSIS — O99213 Obesity complicating pregnancy, third trimester: Secondary | ICD-10-CM | POA: Diagnosis present

## 2022-09-28 DIAGNOSIS — O403XX0 Polyhydramnios, third trimester, not applicable or unspecified: Secondary | ICD-10-CM | POA: Diagnosis not present

## 2022-09-28 DIAGNOSIS — O10913 Unspecified pre-existing hypertension complicating pregnancy, third trimester: Secondary | ICD-10-CM | POA: Diagnosis not present

## 2022-09-28 DIAGNOSIS — O24414 Gestational diabetes mellitus in pregnancy, insulin controlled: Secondary | ICD-10-CM | POA: Insufficient documentation

## 2022-09-28 DIAGNOSIS — O3663X0 Maternal care for excessive fetal growth, third trimester, not applicable or unspecified: Secondary | ICD-10-CM | POA: Diagnosis not present

## 2022-09-28 DIAGNOSIS — Z3009 Encounter for other general counseling and advice on contraception: Secondary | ICD-10-CM

## 2022-09-28 DIAGNOSIS — Z3A34 34 weeks gestation of pregnancy: Secondary | ICD-10-CM | POA: Diagnosis not present

## 2022-09-28 DIAGNOSIS — O09299 Supervision of pregnancy with other poor reproductive or obstetric history, unspecified trimester: Secondary | ICD-10-CM

## 2022-09-28 DIAGNOSIS — E669 Obesity, unspecified: Secondary | ICD-10-CM

## 2022-09-28 DIAGNOSIS — O09293 Supervision of pregnancy with other poor reproductive or obstetric history, third trimester: Secondary | ICD-10-CM

## 2022-09-28 DIAGNOSIS — O24113 Pre-existing diabetes mellitus, type 2, in pregnancy, third trimester: Secondary | ICD-10-CM | POA: Diagnosis not present

## 2022-09-28 DIAGNOSIS — I10 Essential (primary) hypertension: Secondary | ICD-10-CM

## 2022-09-28 DIAGNOSIS — O09899 Supervision of other high risk pregnancies, unspecified trimester: Secondary | ICD-10-CM

## 2022-09-28 DIAGNOSIS — F3341 Major depressive disorder, recurrent, in partial remission: Secondary | ICD-10-CM | POA: Insufficient documentation

## 2022-09-28 DIAGNOSIS — Z794 Long term (current) use of insulin: Secondary | ICD-10-CM

## 2022-09-28 DIAGNOSIS — O24319 Unspecified pre-existing diabetes mellitus in pregnancy, unspecified trimester: Secondary | ICD-10-CM

## 2022-09-28 DIAGNOSIS — O99343 Other mental disorders complicating pregnancy, third trimester: Secondary | ICD-10-CM | POA: Insufficient documentation

## 2022-09-28 DIAGNOSIS — O0990 Supervision of high risk pregnancy, unspecified, unspecified trimester: Secondary | ICD-10-CM

## 2022-09-28 DIAGNOSIS — O10013 Pre-existing essential hypertension complicating pregnancy, third trimester: Secondary | ICD-10-CM

## 2022-09-28 LAB — POCT URINALYSIS DIPSTICK OB
Bilirubin, UA: NEGATIVE
Blood, UA: NEGATIVE
Glucose, UA: NEGATIVE
Ketones, UA: NEGATIVE
Leukocytes, UA: NEGATIVE
Spec Grav, UA: 1.015 (ref 1.010–1.025)
Urobilinogen, UA: 1 E.U./dL
pH, UA: 7 (ref 5.0–8.0)

## 2022-09-28 NOTE — Patient Instructions (Signed)
[place] [facility] Providers  Central/[place]outheast [place] ([address]) [facility] Brown, MD; Chambliss, MD; Eniola, MD; Hensel, MD; McDiarmid, MD; McIntyer, MD [address] [place], [place] [address] [phone] Mon-Fri 8:30-12:30, 1:30-5:00  Providers come to see babies during newborn hospitalization Only accepting infants of Mother's who are seen at Family Medicine Center or have siblings seen at   [facility] Center Medicaid - Yes; Tricare - Yes   [facility] Mulberry, MD [address] [place], [place] [address] [phone] Mon, Tue, Thur, Fri 8:30-5:00, Wed 10:00-7:00 (closed 1-2pm daily for lunch) Takes [place] residents with no insurance.  [place] [facility] only with Medicaid/insurance; Tricare - no  [facility] Ben-Davies, MD; Brown, MD; Chandler, MD; Ettefagh, MD; Grant, MD; Hanvey, MD; Herrin, MD; Jones,  MD; Lester, MD; McCormick, MD; McQueen, MD; Simha, MD; Stanley, MD; Stryffeler, NP [address], [place], [place] [address] [phone] Mon, Tue, Thur, Fri 8:30-5:30, Wed 9:30-5:30, Sat 8:30-12:30 Only accepting infants of first-time parents or siblings of current patients Hospital discharge coordinator will make follow-up appointment Medicaid - yes; Tricare - yes  [place] ([address]) [facility] Cox, MD; Davis, MD; Dovico, MD; Ettefaugh, MD; Lowe, MD; Nation, MD; Slimp, MD; Sumner, MD; Williams, MD [address], [place], [place] [address] [phone] Mon-Fri 8:30-5:00, closed for lunch 12:30-1:30; Sat-Sun 10:00-1:00 Accepting Newborns with commercial insurance only, must call prior to delivery to be accepted into  practice.  Medicaid - no, Tricare - yes   [facility] [address] [place], [place] [address] [phone] or [phone] Mon to Fri 8am to 10pm, Sat 8am to 1pm  (virtual only on weekends) Only accepts Medicaid Healthy Blue pts  [facility] [place]  Artis, MD; Coccaro, MD; Lockett Gardner, MD; Netherton, NP; Roper, MD; Wilmot, PA-C; Skinner, MD [address] [place], [place] [address] [phone] Mon-Fri 8:30-5:30 Medicaid - yes, Tricare - yes  [place] ([address]) [facility] Warner, MD [address], [place], [place] [address] [phone] Mon, Tues, Wed Fri 8:30-5:00, Sat 8:30-12:00, Closed Thursdays Accepting siblings of established patients and first time mom's if you call prenatally Medicaid- yes; Tricare - yes  [facility] Becker, PA; Hagler, MD; Quinn, PA-C; Scifres, PA; Sun, MD; Swayne, MD;  [address], [place], [place] [address] [phone] Mon-Fri 8:30-5:00, closed for lunch 1-2 Only accepting newborns of established patients Medicaid- no; Tricare - yes  [place] ([address]) [facility] Timberlake, MD; [address], [place], [place] [address] [phone] Mon-Fri 8:00-5:00 Medicaid - No; Tricare - Yes  [facility] [employer]  Brake, NP; Wharton, PA [address], [place], [place] [address] [phone] Mon-Fri 8:00-5:00 Medicaid - No, Tricare - Yes  [facility] Gay, MD; Quinlan, MD; Blatt, DNP [address] [place], [place] [address] [phone]  Mon-Fri 8:00-5:00 Medicaid - No; Tricare - Yes  [facility] [address] [place], [place] [address] [phone] Mon-Fri 8:30-5:00 (lunch 12:00-1:00) Medicaid -Yes; Tricare - Yes  [facility] Jordan, MD [address], [place], [place] [address] [phone] Mon-Fri 8:00-5:00 Seeing newborns of current patients only. No new patients Medicaid - No, Tricare - yes  [facility] Parker, MD [address] [place], [place] [address] [phone] Mon-Fri 8:00-5:00 Medicaid -yes as secondary coverage only;   Tricare - yes  [facility] Brecken, PA; Christy, NP; Dees, MD; DeClaire, MD; DeWeese, MD; Hodge, PA; Smoot, NP; Summer, MD; Vapne, MD [address] [place], [place] [address] [phone] Mon-Fri 8:30-5:00, Sat 9:00-11:00 Accepts commercial insurance ONLY. Offers free prenatal information sessions for families. Medicaid - No, Tricare - Call first  [facility] Bouska, MD; Gordon, PA; Jeffery, PA; Weber, PA [address] [place] [address] [phone] Mon-Fri 7:30-5:30 Medicaid - Yes; Tricare - yes  [place] ([address] & [address])  [facility] Reese, MD [address] [place], [place] [address] [phone] Mon-Thur 8:00-6:00, closed for lunch 12-2, closed Fridays Medicaid - yes; Tricare - no  [facility] Anderson, NP; Badger, MD; Beal, PA; Spencer, PA [address], [place], [place] [address] [phone] Mon-Fri 7:30-4:30 Medicaid - yes, Tricare - yes  [facility]  Agbuya, MD; Klett, NP; Romgoolam, MD; Rothstein, NP [address], [place], [place] [address] [phone] Mon-Fri 8:30-5:00, closed for lunch 1-2, Sat 8:30-12:00 - sick visits only Providers come to see babies at [facility] Only accepting newborns of siblings and first time parents ONLY if who have met with office prior to delivery Medicaid -Yes; Tricare - yes  [facility] [place]  Golden, DO; Friddle, NP; Wallace, MD; Wood, MD:  [address], [place], [place] [address] [phone] Mon- Fri 8:00-5:00, Sat 9:00-12:00 - sick visits only Accepting siblings of established patients and first time mom/baby Medicaid - Yes; Tricare - yes Patients must have vaccinations (baby vaccines)  [place] ([address]  [address])  [facility] [address] [place], [place] [address] [phone] Mon-Fri 8:00-5:00 Medicaid - no; Tricare - yes  [facility] Briscoe, MD; Schmidt, PA; Moreira, PA [address], [place], [place] [address] [phone] Mon-Fri 8:00-5:00 Medicaid- yes; Tricare - yes  [facility] Boyd, MD; Jones, NP; Osborn, PA [address], [place], [place] [address] [phone] Mon-Fri 8:00-5:00 Medicaid - Yes; Tricare - yes  [place] ([address])  [facility] Atkinson, PA; Calderon, PA; Cummings, MD; Dillard, MD; Henrish, NP; Isenhour, DO; Martin, PA; Olson, MD; Ott, MD; Phillips, MD; Valente, PA; VanDeven, PA; Yonjof, NP [address], [place], [place] [address] [phone] Mon-Fri 8:30-5:00, Sat 9:00-12:00 - sick only Please register online [email or web address] then schedule online or call office Medicaid-Yes; Tricare -yes  [facility]  Dabrusco, MD; Dial, MD; [place] William, MD; Fleenor, NP; Goolsby, PA; Tonuzi, MD; Turner, NP; West, MD [address], [place], [place] [address] [phone] Mon-Fri 8:00-5:30, Sat&Sun by appointment (phones open at 8:30) Medicaid - Yes; Tricare - yes  [place] ([address]) [facility] Allen, CPNP; Bates, MD; Gordon, MD; Mills, NP; Weinshilboum, DO [address], [place], [place] [address] [phone] M-F 8:00 - 5:15, Sat/Sun 9-12 sick visits only Medicaid - No; Tricare - yes  [facility] [place] [facility]  Brown, PA-C; Cowen, PA-C; Dennis, DO; Fuster, PA-C; Martin, PA-C; Shelton, PA-C; Spry, MD [address] [place], [place] [address] [phone] Mon-Thur 8:00-7:00, Fri 8:00-5:00 Accepting Medicaid for 13 and under only   [facility] [place] [facility] [place] Hayes, FNP; List, FNP; Moran, MD; Pitonzo, PA-C; Scholer,   MD; Spangle, FNP; Nzenwa, FNP; Jasper, MD; Moran, MD [address] [place], [place] [address] [phone] Mon-Fri 8:30-5:30 Medicaid - Yes; Tricare - yes  [facility] [address] [facility]  Kelly, CPNP; Logan, MD; Poth, MD; Ramadoss, MD; Staton, NP [address], [place], [place] [address] [phone] Mon-Thur 8:00-5:30, Fri 8:00-5:00, Sat 9:00-12:00 Medicaid - yes, Tricare - yes  [place] ([address])  [facility] [place] Masneri, DO; Meyers, MD; Nelson, PA [address], [place], [place] [address] [phone] Mon-Fri 8:00-5:00, closed for lunch 12-1 Medicaid - No; Tricare - yes  [facility] [place] McGowen, MD [address], [place], [place] [address] [phone] Mon-Fri 8:00-5:00 Medicaid - No; Tricare - yes  [facility] [place] MacDonald, MD; Nayak, MD; Kearns, MD; Jones, MD [address] [place] [address], [place], [place] [address] [phone] Mon-Fri 8:00-5:00 Medicaid- Yes; Tricare - yes  [place] ([address])  [facility] [place] Martin, PA-C; Tabori, MD [address], [place], [place] [address] [phone] Mon-Fri 8:00-5:00 Medicaid - No; Tricare - yes  [facility] [place]  Margin - CPNP [address], [place], [place] [address] [phone] Mon-Weds 8:00-6:00, Thurs-Fri 8:00-5:00, Sat 9:00-12:00 Medicaid - yes; Tricare - yes   [facility] [place] Aubuchon, MD; Brandon, PA [address] [place], [place] [address] [phone] Mon-Fri 8:00-5:00 Medicaid - yes; Tricare - yes  [place] [facility] Providers  [facility] [address], [place], [place] [address] [phone] M, Thur: 8am -8pm, Tues, Weds: 8am - 5pm; Fri: 8-1 Medicaid - Yes; Tricare -  yes  [facility] Mertz, MD; Johnson, MD; Wells, MD; Downs, PA; Hockenberger, PA [address], [place], [place] [address] [phone] M-F 8:30 - 5:00 Medicaid - Call office; Tricare -yes  [facility] Bonney, MD; Page, MD, Minter, MD; Mueller, PNP; Thomason, NP [address], [place], [place] [address] [phone] M-F 8:30 - 5:00, Sat/Sun 8:30 - 12:30 (sick visits) Medicaid - Call office; Tricare -yes  [facility] Lewis, MD; Shaub, PNP; Boylston, MD; Quaile, PA; Nonato, NP; Landon, CPNP [address], [place], [place] [address] [phone] M-F 8:30 - 5:00 Medicaid - Call office; Tricare - yes  [facility] [place] Cline, MD; Dvergsten, MD; Flores, MD; Kawatu, MD; Nogo, MD [address], [place], [place] [address] [phone] M-Thur: 8:00 - 5:00; Fri: 8:00 - 4:00 Medicaid - yes; Tricare - yes  [facility] [address] [place], [place], [place] [address] [phone] M-F: 8:30- 5:00, closed for lunch 12:30 - 1:00 Medicaid - yes; Tricare -yes  [facility] [place] [address], [place], [place] [address] [phone] M-F 8:00 - 5:00 Medicaid - yes; Tricare - yes  [facility] Johnson, DO; Rumball, DO; Wicker, NP [address] [place], [place], [place] [address] [phone] M-F 8:00 - 5:00, Closed 12-1 for lunch Medicaid - Call; Tricare - yes  [facility] Stein, MD [address], [place],  [address] [phone] M-F: 8:00-5:00, Sat: 8:00 - noon Medicaid - call; Tricare -yes  [place] [facility] Providers  [facility] Collins, FNP-C [address], [place],  [address] [phone] M-W: 8:00-5:00, Thur: 8:00 - 7:00, Fri: 8:00 - noon Medicaid - yes; Tricare - yes  [facility] [place] Adams, FNP [address], [place],   [place] [address] [phone] M-F 8:00 - 5:00, Closed for lunch 12-1 Medicaid -  yes; Tricare - yes  [place] [facility] Providers  [facility] Smith, FNP, Melvin, MD, Fay, FNP-C [address] [place] [address] [place], [place], [place] [address] [phone] M-T 8:00-5:00, Wed-Fri 7:00-6:00 Medicaid - Yes; Tricare -yes  [facility] [place] Civiletti, DO; [address] [place] [address], [place], [place] [address] [phone] M-F 8:00 - 5:00, closed for lunch 12-1 Medicaid - Yes; Tricare - yes  [facility]  Barnes, MD; Bergdolt, MD; Caulfield, MD; Emrich, MD; Fiscus, MD; Hoppens, MD; Kylstra, MD, McPherson, MD; Todd, MD; Prestwood, MD; Waters, MD; Wood, MD [address], [place], [place] [address] [phone] M-F 8:00-5:00 Medicaid - yes; Tricare - yes  [facility] Cheema, MD (speaks Punjabi and Hindi) [address] [place], [place] [address] [phone] M-F: 8:30 - 5:00, closed 12:30 - 1 for lunch Medicaid - Yes; Tricare -yes  [place] [facility] Providers  [facility] Loda, MD; [place], MD; Burke, MD [address], [place], [place] [address] [phone] M-Th: 8:00 - 5:30, Fri: 8:00 - 12:00 Medicaid - yes; Tricare - yes  [facility] [place] Lookabill, NP; Meier, MD; Daffron, MD [address], [place], [place] [address] [phone] M-F: 8:00 - 5:00 Medicaid - yes; Tricare - yes  [facility] Busse, NP; Bowman, NP; Baune, NP; Entwistle, MD; Williams, MD, Huffman, NP, Ferguson, MD; Patel, DO [address], [place], [place] [address] [phone] M-F: 8:30 - 5:30p Medicaid - yes; Tricare - yes Other locations available as well  [facility] [facility] Rajan, MD; Wilson, MD; Morgan, PA-C, Domenech, PA-C; Myers, PA-C [address], [place], [place] [address] [phone] M-W: 8:00am - 7:00pm, Thurs: 8:00am - 8:00pm; Fri: 8:00am -  5:00pm, closed daily from 12-1 for lunch Medicaid - yes; Tricare - yes  [place] [facility] Providers  [facility] [place] Adams, MD; Crystal, FNP; Hadley, MD; Stokes, MD; Johnson, PNP; Brady, PA-C; West, PNP; [place], MD;  [address], [place], [place] [address] [phone] M - Fri: 8am - 5pm, Sat 9-noon Medicaid - Yes; Tricare -yes  [facility] [place] Nayak, MD; Jones, FNP; McDonald, MD; Kearns, MD [address] [place] [address], [place], [place] [phone] M-F 8:00 - 5:00 Medicaid - call; Tricare - yes  [facility] [place] Bell, MD; [place], PNP; Pinder, MD; Anderson, MD; Light, PA-C; Johnson, MD; Latta, MD; Saul, PNP; Rainey, MD; Clifford, MD; McClung, MD [address], [place], [place] [address] [phone] M-F 8:00am - 5:00pm; Sat. 9:00 - 11:00 Medicaid - yes; Tricare - yes  [facility] [place] Soldato-Couture, MD [address], [place], [place] [address] [phone] M-F 8:00 - 5:00 Medicaid - [place] Medicaid only; Tricare - yes  [facility] [place] Walker, MD; Davis, PNP; Ajizian, MD [address] [place] [address], [place], [place] [address] [phone] M-F 8:00 - 5:00 Medicaid - yes; Tricare - yes  [facility] [place] Barry, MD; Brown, MD, Forest, MD, Hazek, MD; Hoyle, MD; Smith, MD; [address], [place],  [address] [phone] M-F: 8-5 Medicaid - yes; Tricare - yes  [facility] [place] Brady, Md; Dowlen, MD; [address] [place] [address], [place],  [address] [phone] M-F 8-5 Medicaid - yes; Tricare - yes  [facility] Kearns, MD; Nayak, MD;   [place]-Court[place], MD; Pellam-Palmer, DNP; Herring, PNP [address], [place], [place] [address] [phone] M-F 8-5 Medicaid - yes; Tricare - yes  [facility] Weathers, MD;  Merritt, PA-C; Davis-PA-C; Warnimont, MD [address], [place], [place] [address] [phone] M-F 7am - 5 pm Medicaid - call; Tricare - yes  [facility] Hill, PNP; Erickson, MD; Robinson, MD [address], [place], [place] [address] [phone] M-F 8-5 Medicaid - yes; Tricare - yes  [facility] Kribbs, MD; Warner, MD; Williams, FNP; Brooks, FNP; Boles, FNP; Romblad, PA-C; [place] - FNP [address], [place], [place] [address] [phone] M-F 8-5 Medicaid- yes; Tricare - yes  [facility] [place] [place] [place] [facility] [place] Yoder, MD; Verenes, MD; Armentrout, MD; Stewart, MD; Beasley, CPNP; Ford, MD; Erickson, MD; Rice, MD [address], [place], [place] [address] [phone] M-F: 8-5, Sat: 9-4, Sun 9-12 Medicaid - yes; Tricare - yes  [facility] Little, PNP; Davis, PNP [address], [place], [place] [address] [phone] M-F 8 - 5, closed 12-1 for lunch Medicaid - yes; Tricare - yes  [facility] Friesen, MD; Cnegia, MD; Rice, MD; Patel, DO [address], [place], [place] [address] [phone] M-F 8- 5:30 Medicaid - yes; Tricare - yes  [facility] [place] Zvolensky, MD; Ray, MD; Haas, MD [address] [address], [place], [place] [address] [phone] M: 8-7; Tues-Fri: 8-5; Sat: 9-12 Medicaid - yes; Tricare - yes  [facility] [place] Heinrich, MD; Meyer, MD; Clark, MD; Rhyne, MD; Aubuchon, MD [address], [place], [place] [address] [phone] M: 8-7; Tues-Fri: 8-5; Sat: 8:30-12:30 Medicaid - yes; Tricare - yes  [facility] [place] Bista, MD; Dillard, PA [address], [place], [place] [address] [phone] Mon-Fri: 8-5 Medicaid - yes;  Tricare - yes  [facility] [place] Beasley, CPNP; Mahle, CPNP; Rice, MD; Duffy, MD; Culler, MD; [address], [place], [place] [address] [phone] M-F: 8-5, closed 1-2 for lunch Medicaid - yes; Tricare - yes  [facility] Rickman, PA; Mounce, NP; Smith, MD; Jordan, CPNP; Darty, PA; Ball, MD; Wallace, MD [address] [place] [address], [place], [place] [address] [phone] M-Thurs: 8-7; Fri: 8-6; Sat: 9-12; Sun 2-4 Medicaid - yes; Tricare - yes  [facility] Brown, MD; Shin, MD; Goodman, DNP, FNP; Sebesta, DO; [address], [place], [place] [address] [phone] M-F: 8-5 Medicaid - yes; Tricare - yes  [place] [facility] Providers  [facility] Dough, MD; Welsh, NP [address], [place], [place] [address] [phone] M - Fri: 8am - 5pm, closed for lunch 12-1 Medicaid - Yes; Tricare - yes  [facility] Manandhar, MD; Riley, MD; Sanger, DO; Vinocur, MD;Hall, PA; Walsh, PA; Campbell, NP [address], [place] [address] [phone] M-F 8:00 - 5:00, Sat 8:00 - 11:30 Medicaid - yes; Tricare - yes  [facility] Khan, MD; Redding, MD, Street, MD, Holt, MD, Burgart, MD; Rhyne, NP; Dickinson, PA;  [address] [place] [address], [place], [place] [address] [phone] M-F 8:10am - 5:00pm Medicaid - yes; Tricare - yes    [facility] Connors, MD; Kime, NP [address], [place], [place] [address] [phone] M-F 8:00 - 5:00 Medicaid - [place] Medicaid only; Tricare - yes  [facility] [place] Whyte, MD; Fox, NP [address], [place], [place] [address] [phone] M-F 8:00 - 5:00; Closed for lunch 12 - 1:00 Medicaid - yes;  Tricare - yes  [facility] Penner, MD; Wilburn, FNP [address], [place], [place] [address] [phone] Mon 9-5; Tues/Wed 10-5; Thurs 8:30-5; Fri: 8-12:30 Medicaid - yes; Tricare - yes  [place] [facility] Providers  [facility]  Golding, MD; Jackson, PA-C [address], [place], [place] [address] [phone] phone [phone] fax M-F 7:15 - 4:30 Medicaid - yes; Tricare - yes  [facility] Gosrani, MD; Meccariello, DO [address] [place], [place] [address] [phone] M-Fri: 8:30 - 5:00, closed for lunch everyday noon - 1pm Medicaid - Yes; Tricare - yes  [facility] Burdine, MD; Daniel, MD; Howard, MD; Sasser, MD; Boles, PA; Boyd, PA-C; Carroll, PA; McGee, PA; Skillman, PA; Wilson, PA [address] [place], [place] [address] [phone] M-Thurs: 7:30am - 7:00pm; Friday 7:30am - 4pm; Sat: 8:00 - 1:00 Medicaid - Yes; Tricare - yes  [facility] [place] Akhbari, MD; Law, MD; Qayumi, MD; Salvador, DO [address], [place], [place] [address] [phone] M-Thur: 8:00 - 5:00, Fri: 8:00 - Noon Medicaid - yes; Tricare - yes No [employer]  [facility] [place] [facility] Dettinger, MD; Gottschalk, DO; Hawks, NP; Martin, NP; Morgan, NP; Milian, NP; Rakes, NP; Stacks, MD; Webster, PA [address], [place], [place] [address] [phone] M-F 8:00 - 5:00 Medicaid - yes; Tricare - yes  [facility] Collins, FNP-C; Bucio, FNP-C [address], [place], [place] [address] [phone] M, W, R 8:00-5:00, Tues: 8:00am - 7:00pm; Fri 8:00 - noon Medicaid - Yes; Tricare - yes  [facility] Khan, MD [address] [place] [address] [place],  [address] [phone]  M-Thurs 8:30-5:30, Fri: 8:30-12:30pm Medicaid - Yes; Tricare - N  

## 2022-09-28 NOTE — Telephone Encounter (Signed)
I contacted the patient via phone. The patient is needing weekly NST's. I have the patient scheduled for Monday, 4/22 and 4/29 at 1:15 pm. There are currently no openings with providers Crystal could you please advise on the swabs?

## 2022-09-29 ENCOUNTER — Other Ambulatory Visit: Payer: BC Managed Care – PPO

## 2022-09-30 DIAGNOSIS — O10013 Pre-existing essential hypertension complicating pregnancy, third trimester: Secondary | ICD-10-CM | POA: Insufficient documentation

## 2022-09-30 DIAGNOSIS — Z3A35 35 weeks gestation of pregnancy: Secondary | ICD-10-CM | POA: Insufficient documentation

## 2022-09-30 HISTORY — DX: Pre-existing essential hypertension complicating pregnancy, third trimester: O10.013

## 2022-09-30 NOTE — Progress Notes (Signed)
   PRENATAL VISIT NOTE  Subjective:  Penny Gonzalez is a 27 y.o. [phone] at [redacted]w[redacted]d being seen today for ongoing prenatal care.  She is currently monitored for the following issues for this high-risk pregnancy and has Vitamin D deficiency; Major depression; Proteinuria; Essential hypertension, benign; Fatty liver; Acanthosis nigricans; Obesity in pregnancy; Recurrent major depressive disorder, in partial remission; Supervision of high risk pregnancy, antepartum; History of preterm delivery, currently pregnant; History of pre-eclampsia in prior pregnancy, currently pregnant; Preexisting diabetes complicating pregnancy, antepartum; Unwanted fertility; Diabetes mellitus without complication; and Polyhydramnios affecting pregnancy in third trimester on their problem list.  Patient reports backache and sciatic nerve pain, umbilical pain .  Contractions: Regular. Vag. Bleeding: None.  Movement: Present. Denies leaking of fluid.   The following portions of the patient's history were reviewed and updated as appropriate: allergies, current medications, past family history, past medical history, past social history, past surgical history and problem list.   Objective:   Vitals:   09/28/22 1432  BP: 107/71  Pulse: 99  Weight: 249 lb (112.9 kg)    Fetal Status: Fetal Heart Rate (bpm): 140   Movement: Present     General:  Alert, oriented and cooperative. Patient is in no acute distress.  Skin: Skin is warm and dry. No rash noted.   Cardiovascular: Normal heart rate noted  Respiratory: Normal respiratory effort, no problems with respiration noted  Abdomen: Soft, gravid, appropriate for gestational age.  Pain/Pressure: Present     Pelvic: Cervical exam deferred        Extremities: Normal range of motion.     Mental Status: Normal mood and affect. Normal behavior. Normal judgment and thought content.   Assessment and Plan:  Pregnancy: [id number] at [redacted]w[redacted]d 1. Supervision of high risk pregnancy,  antepartum - POC Urinalysis Dipstick OB  2. Preexisting diabetes complicating pregnancy, antepartum - Patient has not been able to write down sugars, but we went through her CBG machine today and fastings are 95-107 and PP are 120-140. No changes in medications indicated at this time  - Plan for delivery 37 weeks, will work on scheduling IOL for 5/6  3. History of pre-eclampsia in prior pregnancy, currently pregnant  4. Obesity in pregnancy  5. Polyhydramnios affecting pregnancy in third trimester - [place] from MFM today pending, no change in management recommended previously  - Will start twice weekly testing recommended by MFM  - Followed by MFM   6. History of preterm delivery, currently pregnant  7. Unwanted fertility - BTL consent signed previously   8. Pre-existing essential hypertension during pregnancy in third trimester - ASA, no anti-hypertensives, normotensive today   9. [redacted] weeks gestation of pregnancy   Preterm labor symptoms and general obstetric precautions including but not limited to vaginal bleeding, contractions, leaking of fluid and fetal movement were reviewed in detail with the patient. Please refer to After Visit Summary for other counseling recommendations.   Return in about 1 week (around 10/05/2022) for [facility] MD only.  Future Appointments  Date Time Provider Department Center  10/03/2022  9:15 AM [facility] ROOM [facility] None  10/03/2022 10:35 AM Doreene Burke, CNM [facility] None  10/05/2022  3:00 PM [facility]  10/10/2022  1:15 PM [facility]NST ROOM [facility] None  10/12/2022 10:30 AM [facility]    Vonzella Nipple, PA-C

## 2022-09-30 NOTE — Progress Notes (Unsigned)
    NURSE VISIT NOTE  Subjective:    Patient ID: Penny Gonzalez, female    DOB: Jan 02, 1996, 27 y.o.   MRN: [id number]  HPI  Patient is a 27 y.o. [phone] female who presents for fetal monitoring per order from {AOBProviders:28529}.   Objective:    LMP 01/29/2022 (Exact Date)  Estimated Date of Delivery: 11/05/22  [redacted]w[redacted]d  Fetus A Non-Stress Test Interpretation for 09/30/22  Indication: Chronic Hypertenstion, Polyhydramnios, and Pre-existing diabetes insulin controlled            Assessment:   1. Pre-existing diabetes mellitus during pregnancy in third trimester   2. Pre-existing essential hypertension during pregnancy in third trimester   3. Polyhydramnios affecting pregnancy in third trimester   4. [redacted] weeks gestation of pregnancy      Plan:   Results reviewed and discussed with patient by  {AOBProviders:28529}.     Rocco Serene, LPN

## 2022-09-30 NOTE — Patient Instructions (Signed)

## 2022-10-03 ENCOUNTER — Ambulatory Visit (INDEPENDENT_AMBULATORY_CARE_PROVIDER_SITE_OTHER): Payer: BC Managed Care – PPO

## 2022-10-03 ENCOUNTER — Other Ambulatory Visit: Payer: Self-pay | Admitting: Obstetrics and Gynecology

## 2022-10-03 ENCOUNTER — Other Ambulatory Visit: Payer: BC Managed Care – PPO

## 2022-10-03 ENCOUNTER — Telehealth: Payer: Self-pay

## 2022-10-03 ENCOUNTER — Encounter: Payer: BC Managed Care – PPO | Admitting: Certified Nurse Midwife

## 2022-10-03 ENCOUNTER — Other Ambulatory Visit: Payer: Self-pay

## 2022-10-03 VITALS — BP 137/82 | HR 109 | Ht 68.0 in | Wt 256.1 lb

## 2022-10-03 DIAGNOSIS — O403XX0 Polyhydramnios, third trimester, not applicable or unspecified: Secondary | ICD-10-CM

## 2022-10-03 DIAGNOSIS — E119 Type 2 diabetes mellitus without complications: Secondary | ICD-10-CM | POA: Diagnosis not present

## 2022-10-03 DIAGNOSIS — O24313 Unspecified pre-existing diabetes mellitus in pregnancy, third trimester: Secondary | ICD-10-CM

## 2022-10-03 DIAGNOSIS — O24113 Pre-existing diabetes mellitus, type 2, in pregnancy, third trimester: Secondary | ICD-10-CM | POA: Diagnosis not present

## 2022-10-03 DIAGNOSIS — Z3A35 35 weeks gestation of pregnancy: Secondary | ICD-10-CM

## 2022-10-03 DIAGNOSIS — O99210 Obesity complicating pregnancy, unspecified trimester: Secondary | ICD-10-CM

## 2022-10-03 DIAGNOSIS — O10013 Pre-existing essential hypertension complicating pregnancy, third trimester: Secondary | ICD-10-CM | POA: Diagnosis not present

## 2022-10-03 DIAGNOSIS — O24414 Gestational diabetes mellitus in pregnancy, insulin controlled: Secondary | ICD-10-CM

## 2022-10-03 DIAGNOSIS — Z3490 Encounter for supervision of normal pregnancy, unspecified, unspecified trimester: Secondary | ICD-10-CM

## 2022-10-03 DIAGNOSIS — O09299 Supervision of pregnancy with other poor reproductive or obstetric history, unspecified trimester: Secondary | ICD-10-CM

## 2022-10-03 DIAGNOSIS — Z794 Long term (current) use of insulin: Secondary | ICD-10-CM

## 2022-10-03 NOTE — Telephone Encounter (Signed)
Spoke with Inetta Fermo. Changed time of inductionon 5/4 from 5am to 12am (midnight).

## 2022-10-03 NOTE — Telephone Encounter (Signed)
Left voicemail to return call. 

## 2022-10-03 NOTE — Telephone Encounter (Signed)
Penny Gonzalez called into triage asking about her FMLA form, I missed a box stating where they took her out of work due to high risk pregnancy. So I reprinted them and fixed it and faxed it back.

## 2022-10-03 NOTE — Telephone Encounter (Signed)
Spoke with patient. Advised Induction scheduled for 5/4  am. Patient would like to go in at Midnight. Will call to update L&D on time.

## 2022-10-03 NOTE — Telephone Encounter (Signed)
Patient returned call per Dustin Folks.

## 2022-10-04 ENCOUNTER — Encounter: Payer: Self-pay | Admitting: Obstetrics and Gynecology

## 2022-10-04 ENCOUNTER — Other Ambulatory Visit: Payer: BC Managed Care – PPO

## 2022-10-05 ENCOUNTER — Ambulatory Visit: Payer: BC Managed Care – PPO | Attending: Maternal & Fetal Medicine

## 2022-10-05 ENCOUNTER — Ambulatory Visit (INDEPENDENT_AMBULATORY_CARE_PROVIDER_SITE_OTHER): Payer: BC Managed Care – PPO | Admitting: Obstetrics and Gynecology

## 2022-10-05 ENCOUNTER — Other Ambulatory Visit: Payer: Self-pay

## 2022-10-05 ENCOUNTER — Other Ambulatory Visit (HOSPITAL_COMMUNITY)
Admission: RE | Admit: 2022-10-05 | Discharge: 2022-10-05 | Disposition: A | Discharge status: Home / Self Care | Payer: BC Managed Care – PPO | Source: Ambulatory Visit | Attending: Certified Nurse Midwife | Admitting: Certified Nurse Midwife

## 2022-10-05 VITALS — BP 132/80 | HR 98 | Temp 98.0°F | Wt 252.5 lb

## 2022-10-05 VITALS — BP 111/77 | HR 112 | Wt 253.4 lb

## 2022-10-05 DIAGNOSIS — O3663X0 Maternal care for excessive fetal growth, third trimester, not applicable or unspecified: Secondary | ICD-10-CM | POA: Insufficient documentation

## 2022-10-05 DIAGNOSIS — O10913 Unspecified pre-existing hypertension complicating pregnancy, third trimester: Secondary | ICD-10-CM | POA: Diagnosis not present

## 2022-10-05 DIAGNOSIS — E119 Type 2 diabetes mellitus without complications: Secondary | ICD-10-CM

## 2022-10-05 DIAGNOSIS — O403XX0 Polyhydramnios, third trimester, not applicable or unspecified: Secondary | ICD-10-CM | POA: Diagnosis not present

## 2022-10-05 DIAGNOSIS — O0990 Supervision of high risk pregnancy, unspecified, unspecified trimester: Secondary | ICD-10-CM | POA: Diagnosis present

## 2022-10-05 DIAGNOSIS — Z3688 Encounter for antenatal screening for fetal macrosomia: Secondary | ICD-10-CM

## 2022-10-05 DIAGNOSIS — O24113 Pre-existing diabetes mellitus, type 2, in pregnancy, third trimester: Secondary | ICD-10-CM | POA: Insufficient documentation

## 2022-10-05 DIAGNOSIS — O09293 Supervision of pregnancy with other poor reproductive or obstetric history, third trimester: Secondary | ICD-10-CM | POA: Insufficient documentation

## 2022-10-05 DIAGNOSIS — O10013 Pre-existing essential hypertension complicating pregnancy, third trimester: Secondary | ICD-10-CM

## 2022-10-05 DIAGNOSIS — Z3A35 35 weeks gestation of pregnancy: Secondary | ICD-10-CM | POA: Diagnosis present

## 2022-10-05 DIAGNOSIS — O113 Pre-existing hypertension with pre-eclampsia, third trimester: Secondary | ICD-10-CM | POA: Diagnosis not present

## 2022-10-05 DIAGNOSIS — O99213 Obesity complicating pregnancy, third trimester: Secondary | ICD-10-CM

## 2022-10-05 DIAGNOSIS — O09299 Supervision of pregnancy with other poor reproductive or obstetric history, unspecified trimester: Secondary | ICD-10-CM

## 2022-10-05 DIAGNOSIS — Z113 Encounter for screening for infections with a predominantly sexual mode of transmission: Secondary | ICD-10-CM | POA: Insufficient documentation

## 2022-10-05 DIAGNOSIS — O99613 Diseases of the digestive system complicating pregnancy, third trimester: Secondary | ICD-10-CM

## 2022-10-05 DIAGNOSIS — Z794 Long term (current) use of insulin: Secondary | ICD-10-CM

## 2022-10-05 DIAGNOSIS — E669 Obesity, unspecified: Secondary | ICD-10-CM

## 2022-10-05 DIAGNOSIS — B951 Streptococcus, group B, as the cause of diseases classified elsewhere: Secondary | ICD-10-CM

## 2022-10-05 DIAGNOSIS — O09899 Supervision of other high risk pregnancies, unspecified trimester: Secondary | ICD-10-CM

## 2022-10-05 DIAGNOSIS — I10 Essential (primary) hypertension: Secondary | ICD-10-CM

## 2022-10-05 DIAGNOSIS — K760 Fatty (change of) liver, not elsewhere classified: Secondary | ICD-10-CM

## 2022-10-05 DIAGNOSIS — Z3685 Encounter for antenatal screening for Streptococcus B: Secondary | ICD-10-CM

## 2022-10-05 DIAGNOSIS — O24414 Gestational diabetes mellitus in pregnancy, insulin controlled: Secondary | ICD-10-CM

## 2022-10-05 DIAGNOSIS — O99210 Obesity complicating pregnancy, unspecified trimester: Secondary | ICD-10-CM

## 2022-10-05 LAB — POCT URINALYSIS DIPSTICK OB
Bilirubin, UA: NEGATIVE
Blood, UA: NEGATIVE
Ketones, UA: NEGATIVE
Leukocytes, UA: NEGATIVE
Nitrite, UA: NEGATIVE
Spec Grav, UA: 1.015 (ref 1.010–1.025)
Urobilinogen, UA: 0.2 E.U./dL
pH, UA: 6 (ref 5.0–8.0)

## 2022-10-05 NOTE — Patient Instructions (Signed)
Natural Cervical Ripening Supplements ? ?Red Raspberry Leaf Tea ?2-4 cups daily.  Start at [redacted] weeks gestation and continue until labor.  ? ? ? ?Evening Primrose Oil (1000 mg) ?Take twice daily - by mouth in the morning and vaginally at bedtime. (If previous C-section, take both doses by mouth) ?Start at 38 weeks and continue until labor ? ? ? ?Blue Cohosh ?1 capsule daily starting at [redacted] weeks gestation ?2 capsules daily starting at [redacted] weeks gestation ?3 capsules daily starting at [redacted] weeks gestation ? ?

## 2022-10-05 NOTE — Progress Notes (Unsigned)
ROB [redacted]w[redacted]d: She is having lots of pressure and pain. She reports good fetal movement.

## 2022-10-05 NOTE — Progress Notes (Signed)
ROB: Patient is a 27 y.o. [id number] at [redacted]w[redacted]d who presents for routine OB care.  Pregnancy is complicated:  has Pre-existing diabetes mellitus during pregnancy in third trimester; Vitamin D deficiency; Major depression; Proteinuria; Essential hypertension, benign; Fatty liver; Acanthosis nigricans; Obesity in pregnancy; Recurrent major depressive disorder, in partial remission; Supervision of high risk pregnancy, antepartum; History of preterm delivery, currently pregnant; History of pre-eclampsia in prior pregnancy, currently pregnant; Preexisting diabetes complicating pregnancy, antepartum; Unwanted fertility; Diabetes mellitus without complication; Polyhydramnios affecting pregnancy in third trimester; Pre-existing essential hypertension during pregnancy in third trimester; and [redacted] weeks gestation of pregnancy on their problem list.. Patient has complaints of constant pelvic pressure.

## 2022-10-06 ENCOUNTER — Encounter: Payer: Self-pay | Admitting: Certified Nurse Midwife

## 2022-10-06 ENCOUNTER — Encounter: Payer: BC Managed Care – PPO | Admitting: Physical Therapy

## 2022-10-06 DIAGNOSIS — B951 Streptococcus, group B, as the cause of diseases classified elsewhere: Secondary | ICD-10-CM | POA: Insufficient documentation

## 2022-10-06 HISTORY — DX: Streptococcus, group b, as the cause of diseases classified elsewhere: B95.1

## 2022-10-06 LAB — CERVICOVAGINAL ANCILLARY ONLY
Chlamydia: NEGATIVE
Comment: NEGATIVE
Comment: NORMAL
Neisseria Gonorrhea: NEGATIVE

## 2022-10-07 LAB — STREP GP B NAA: Strep Gp B NAA: NEGATIVE

## 2022-10-10 ENCOUNTER — Observation Stay
Admission: EM | Admit: 2022-10-10 | Discharge: 2022-10-10 | Disposition: A | Discharge status: Home / Self Care | Payer: BC Managed Care – PPO | Attending: Obstetrics and Gynecology | Admitting: Obstetrics and Gynecology

## 2022-10-10 ENCOUNTER — Ambulatory Visit (INDEPENDENT_AMBULATORY_CARE_PROVIDER_SITE_OTHER): Payer: BC Managed Care – PPO

## 2022-10-10 ENCOUNTER — Encounter: Payer: Self-pay | Admitting: Obstetrics and Gynecology

## 2022-10-10 ENCOUNTER — Other Ambulatory Visit: Payer: Self-pay

## 2022-10-10 VITALS — BP 130/84 | HR 118 | Ht 68.0 in | Wt 249.8 lb

## 2022-10-10 DIAGNOSIS — O99013 Anemia complicating pregnancy, third trimester: Secondary | ICD-10-CM | POA: Diagnosis not present

## 2022-10-10 DIAGNOSIS — O10013 Pre-existing essential hypertension complicating pregnancy, third trimester: Secondary | ICD-10-CM

## 2022-10-10 DIAGNOSIS — E119 Type 2 diabetes mellitus without complications: Secondary | ICD-10-CM | POA: Diagnosis not present

## 2022-10-10 DIAGNOSIS — O133 Gestational [pregnancy-induced] hypertension without significant proteinuria, third trimester: Secondary | ICD-10-CM | POA: Insufficient documentation

## 2022-10-10 DIAGNOSIS — O0990 Supervision of high risk pregnancy, unspecified, unspecified trimester: Secondary | ICD-10-CM

## 2022-10-10 DIAGNOSIS — O288 Other abnormal findings on antenatal screening of mother: Secondary | ICD-10-CM | POA: Diagnosis present

## 2022-10-10 DIAGNOSIS — Z7982 Long term (current) use of aspirin: Secondary | ICD-10-CM | POA: Insufficient documentation

## 2022-10-10 DIAGNOSIS — O403XX0 Polyhydramnios, third trimester, not applicable or unspecified: Secondary | ICD-10-CM | POA: Diagnosis not present

## 2022-10-10 DIAGNOSIS — Z794 Long term (current) use of insulin: Secondary | ICD-10-CM | POA: Diagnosis not present

## 2022-10-10 DIAGNOSIS — O24419 Gestational diabetes mellitus in pregnancy, unspecified control: Secondary | ICD-10-CM | POA: Insufficient documentation

## 2022-10-10 DIAGNOSIS — O24113 Pre-existing diabetes mellitus, type 2, in pregnancy, third trimester: Secondary | ICD-10-CM | POA: Diagnosis not present

## 2022-10-10 DIAGNOSIS — Z3A36 36 weeks gestation of pregnancy: Secondary | ICD-10-CM

## 2022-10-10 DIAGNOSIS — O368330 Maternal care for abnormalities of the fetal heart rate or rhythm, third trimester, not applicable or unspecified: Principal | ICD-10-CM

## 2022-10-10 DIAGNOSIS — O24313 Unspecified pre-existing diabetes mellitus in pregnancy, third trimester: Secondary | ICD-10-CM

## 2022-10-10 DIAGNOSIS — Z7984 Long term (current) use of oral hypoglycemic drugs: Secondary | ICD-10-CM

## 2022-10-10 NOTE — Progress Notes (Signed)
Pt discharged home per order.   Pt stable and ambulatory and an After Visit Summary was printed and given to the patient. Discharge education completed with patient  including follow up instructions, appointments, and medication list. Pt received labor and bleeding precautions. Patient able to verbalize understanding, all questions fully answered upon discharge.

## 2022-10-10 NOTE — Progress Notes (Signed)
Pt presents to L/D triage for NST after non-reactive NST in office. Pt also reports decreased fetal movement and constant vaginal pressure. Pt reports no bleeding or LOF and positive fetal movement. Pt reports feeling no painful contractions, urinary discomfort , or unusual vaginal discharge. Pt reports finishing course of Monistat at home.  Pt PO hydrating and reports taking insulin/checking CBGs as prescribed.  CNM at bedside to eval pt and NST- reactive. Discussed POC and induction with with patient. Pt verbalized understanding and feeling fetal movement.

## 2022-10-10 NOTE — Patient Instructions (Signed)

## 2022-10-10 NOTE — Discharge Summary (Addendum)
Attestation of Attending Supervision of Advanced Practice Provider (CNM/NP/PA): Evaluation and management procedures were performed by the Advanced Practice Provider under my supervision and collaboration.  I have reviewed the Advanced Practice Provider's note and chart, and I agree with the management and plan. I have also made any necessary editorial changes.   Please see Final Progress note  Penny Gonzalez, [facility]  10/10/2022 6:26 PM

## 2022-10-10 NOTE — Progress Notes (Signed)
    NURSE VISIT NOTE  Subjective:    Patient ID: Penny Gonzalez, female    DOB: 27-Jul-1995, 27 y.o.   MRN: [id number]  HPI  Patient is a 27 y.o. [phone] female who presents for fetal monitoring per order from Hildred Laser, MD.   Objective:    BP 130/84   Pulse (!) 118   Ht 5\' 8"  (1.727 m)   Wt 249 lb 12.8 oz (113.3 kg)   LMP 01/29/2022 (Exact Date)   BMI 37.98 kg/m  Estimated Date of Delivery: 11/05/22  [redacted]w[redacted]d  Fetus A Non-Stress Test Interpretation for 10/10/22  Indication: Chronic Hypertenstion, Diabetes, and Polyhydramnios  Fetal Heart Rate A Mode: External Baseline Rate (A): 140 bpm Variability: Moderate Accelerations: 15 x 15 (only 1 15x15) Decelerations: None Multiple birth?: No  Uterine Activity Mode: Toco Contraction Frequency (min): None  Interpretation (Fetal Testing) Nonstress Test Interpretation: Non-reactive Overall Impression: Reassuring for gestational age   Assessment:   1. Pre-existing essential hypertension during pregnancy in third trimester   2. Pre-existing diabetes mellitus during pregnancy in third trimester   3. Polyhydramnios affecting pregnancy in third trimester   4. [redacted] weeks gestation of pregnancy      Plan:   Results reviewed Doreene Burke, CNM and discussed with patient. NST NON-REACTIVE. Patient sent to L&D for extended monitoring.     Rocco Serene, LPN

## 2022-10-10 NOTE — Final Progress Note (Cosign Needed Addendum)
Final Progress Note  Patient ID: Penny Gonzalez MRN: [id number] DOB/AGE: 1996-05-25 27 y.o.  Admit date: 10/10/2022 Admitting provider: Mirna Mires, CNM Discharge date: 10/10/2022   Admission Diagnoses: non reactivee non stress test IUP 36 weeks 2 days Discharge Diagnoses:   Reactive fetal heart tones  History of Present Illness: The patient is a 27 y.o. female [phone] at [redacted]w[redacted]d who presents for some extended fetal monitoring. She is sent by the AOB office where she has an NST that did not meet standard. Her pregnancy is considered higher risk secondary to Diabetes (insulin), previous polyhydramnios, and CHTN.  Past Medical History:  Diagnosis Date   Anemia, iron deficiency, inadequate dietary intake 12/01/2014   Depression    patient has not been officially diagnosed but has symptoms   Diabetes mellitus without complication (HCC)    Recently changed to Glyburide and thinks it is helping   Essential hypertension, benign 03/31/2016   Irregular periods/menstrual cycles    Joint pain of leg    Major depression 11/23/2015   Sciatica     Past Surgical History:  Procedure Laterality Date   CHOLECYSTECTOMY N/A 05/19/2016   Procedure: LAPAROSCOPIC CHOLECYSTECTOMY WITH INTRAOPERATIVE CHOLANGIOGRAM;  Surgeon: Nadeen Landau, MD;  Location: [facility] ORS;  Service: General;  Laterality: N/A;    No current facility-administered medications on file prior to encounter.   Current Outpatient Medications on File Prior to Encounter  Medication Sig Dispense Refill   Accu-Chek Softclix Lancets lancets Use to check blood sugar 4 times daily. 100 each 12   aspirin EC 81 MG tablet Take 1 tablet (81 mg total) by mouth daily. Swallow whole. 90 tablet 4   blood glucose meter kit and supplies KIT Dispense based on patient and insurance preference. Use daily in the am before breakfast.  For Dx DM type 2, E11.69 1 each 0   Blood Glucose Monitoring Suppl (ACCU-CHEK GUIDE) w/Device KIT Use  glucose meter to check blood sugar 4 times daily. 1 kit 0   ferrous sulfate (FERROUSUL) 325 (65 FE) MG tablet Take 1 tablet (325 mg total) by mouth 2 (two) times daily. 60 tablet 1   glucose blood (ACCU-CHEK GUIDE) test strip Use 1 strip to check blood sugar 4 times daily 100 each 12   insulin NPH Human (NOVOLIN N) 100 UNIT/ML injection Inject 0.16 mLs (16 Units total) into the skin daily before breakfast. 10 mL 11   insulin NPH Human (NOVOLIN N) 100 UNIT/ML injection Inject 0.06 mLs (6 Units total) into the skin daily before supper. 10 mL 11   Insulin Syringe-Needle U-100 (INSULIN SYRINGE .3CC/30GX5/16") 30G X 5/16" 0.3 ML MISC 2 Units by Does not apply route 2 (two) times daily. 60 each 3   prenatal vitamin w/FE, FA (NATACHEW) 29-1 MG CHEW chewable tablet Chew 2 tablets by mouth daily at 12 noon.     insulin aspart (NOVOLOG) 100 UNIT/ML injection Inject 8 Units into the skin daily before breakfast. (Patient taking differently: Inject 10 Units into the skin daily before breakfast.) 10 mL 11   insulin aspart (NOVOLOG) 100 UNIT/ML injection Inject 6 Units into the skin daily before supper. (Patient taking differently: Inject 10 Units into the skin daily before supper.) 10 mL 11    Allergies  Allergen Reactions   Latex Rash    Pruritic, urticaria    Social History   Socioeconomic History   Marital status: Single    Spouse name: Not on file   Number of children: 1   Years  of education: 13   Highest education level: High school graduate  Occupational History   Occupation: [employer]  Tobacco Use   Smoking status: Never   Smokeless tobacco: Never  Vaping Use   Vaping Use: Never used  Substance and Sexual Activity   Alcohol use: Not Currently    Comment: OCC   Drug use: No   Sexual activity: Yes    Partners: Male    Comment: tubal  Other Topics Concern   Not on file  Social History Narrative   Not on file   Social Determinants of Health   Financial Resource Strain: Low Risk   (04/28/2022)   Overall Financial Resource Strain (CARDIA)    Difficulty of Paying Living Expenses: Not hard at all  Food Insecurity: No Food Insecurity (04/28/2022)   Hunger Vital Sign    Worried About Running Out of Food in the Last Year: Never true    Ran Out of Food in the Last Year: Never true  Transportation Needs: No Transportation Needs (04/28/2022)   PRAPARE - Administrator, Civil Service (Medical): No    Lack of Transportation (Non-Medical): No  Physical Activity: Inactive (04/28/2022)   Exercise Vital Sign    Days of Exercise per Week: 0 days    Minutes of Exercise per Session: 0 min  Stress: No Stress Concern Present (04/28/2022)   [employer] of Occupational Health - Occupational Stress Questionnaire    Feeling of Stress : Only a little  Social Connections: Moderately Integrated (04/28/2022)   Social Connection and Isolation Panel [NHANES]    Frequency of Communication with Friends and Family: More than three times a week    Frequency of Social Gatherings with Friends and Family: Once a week    Attends Religious Services: 1 to 4 times per year    Active Member of [employer] or Organizations: No    Attends Banker Meetings: Never    Marital Status: Living with partner  Intimate Partner Violence: Not At Risk (04/28/2022)   Humiliation, Afraid, Rape, and Kick questionnaire    Fear of Current or Ex-Partner: No    Emotionally Abused: No    Physically Abused: No    Sexually Abused: No    Family History  Problem Relation Age of Onset   Diabetes Mother    Arthritis Father    Diabetes Father    Heart disease Father    Hypertension Father    Stroke Father    Lupus Sister    Deafness Sister    Diabetes Maternal Grandmother    Hypertension Maternal Grandmother    Heart disease Paternal Grandmother    Hypertension Paternal Grandmother      Review of Systems  Constitutional: Negative.   HENT: Negative.    Eyes: Negative.   Respiratory:  Negative.    Cardiovascular: Negative.   Gastrointestinal: Negative.   Genitourinary: Negative.   Musculoskeletal:  Positive for back pain.  Skin: Negative.   Neurological: Negative.   Endo/Heme/Allergies: Negative.   Psychiatric/Behavioral: Negative.       Physical Exam: BP 134/65 (BP Location: Right Arm)   Pulse (!) 116   Temp 97.9 F (36.6 C) (Oral)   Resp 18   Ht 5\' 8"  (1.727 m)   Wt 112.9 kg   LMP 01/29/2022 (Exact Date)   BMI 37.86 kg/m   Physical Exam Constitutional:      Appearance: She is obese.  Genitourinary:     Genitourinary Comments: deferred  Cardiovascular:  Rate and Rhythm: Normal rate and regular rhythm.     Pulses: Normal pulses.     Heart sounds: Normal heart sounds.  Pulmonary:     Effort: Pulmonary effort is normal.     Breath sounds: Normal breath sounds.  Abdominal:     Comments: Gravid. Measures large for dates.  Musculoskeletal:        General: Normal range of motion.     Cervical back: Normal range of motion and neck supple.  Neurological:     General: No focal deficit present.     Mental Status: She is alert and oriented to person, place, and time.  Skin:    General: Skin is warm and dry.  Psychiatric:        Mood and Affect: Mood normal.        Behavior: Behavior normal.     Consults: None  Significant Findings/ Diagnostic Studies: NA  Procedures: EFM NST Baseline FHR: 140 beats/min Variability: moderate Accelerations: present Decelerations: absent Tocometry: some uterine irritability noted. No palpable contractions  Interpretation:  INDICATIONS: diabetes mellitus and chronic hypertension RESULTS:  A NST procedure was performed with FHR monitoring and a normal baseline established, appropriate time of 20-40 minutes of evaluation, and accels >2 seen w 15x15 characteristics.  Results show a REACTIVE NST.    Hospital Course: The patient was admitted to Labor and Delivery Triage for observation. She was monitored and a  Category 1 strip was observed. Her blood pressures were WNL today. With reassurance, she was then discharged home. She is scheduled for IOL this coming weekend.  Discharge Condition: good  Disposition: Discharge disposition: 01-Home or Self Care       Diet: Diabetic diet  Discharge Activity: Activity as tolerated  Discharge Instructions     Fetal Kick Count:  Lie on our left side for one hour after a meal, and count the number of times your baby kicks.  If it is less than 5 times, get up, move around and drink some juice.  Repeat the test 30 minutes later.  If it is still less than 5 kicks in an hour, notify your doctor.   Complete by: As directed    LABOR:  When conractions begin, you should start to time them from the beginning of one contraction to the beginning  of the next.  When contractions are 5 - 10 minutes apart or less and have been regular for at least an hour, you should call your health care provider.   Complete by: As directed    Notify physician for bleeding from the vagina   Complete by: As directed    Notify physician for blurring of vision or spots before the eyes   Complete by: As directed    Notify physician for chills or fever   Complete by: As directed    Notify physician for fainting spells, "black outs" or loss of consciousness   Complete by: As directed    Notify physician for increase in vaginal discharge   Complete by: As directed    Notify physician for leaking of fluid   Complete by: As directed    Notify physician for pain or burning when urinating   Complete by: As directed    Notify physician for pelvic pressure (sudden increase)   Complete by: As directed    Notify physician for severe or continued nausea or vomiting   Complete by: As directed    Notify physician for sudden gushing of fluid from the vagina (with  or without continued leaking)   Complete by: As directed    Notify physician for sudden, constant, or occasional abdominal pain    Complete by: As directed    Notify physician if baby moving less than usual   Complete by: As directed       Allergies as of 10/10/2022       Reactions   Latex Rash   Pruritic, urticaria        Medication List     TAKE these medications    Accu-Chek Guide test strip Generic drug: glucose blood Use 1 strip to check blood sugar 4 times daily   Accu-Chek Guide w/Device Kit Use glucose meter to check blood sugar 4 times daily.   Accu-Chek Softclix Lancets lancets Use to check blood sugar 4 times daily.   aspirin EC 81 MG tablet Take 1 tablet (81 mg total) by mouth daily. Swallow whole.   blood glucose meter kit and supplies Kit Dispense based on patient and insurance preference. Use daily in the am before breakfast.  For Dx DM type 2, E11.69   ferrous sulfate 325 (65 FE) MG tablet Commonly known as: FerrouSul Take 1 tablet (325 mg total) by mouth 2 (two) times daily.   insulin aspart 100 UNIT/ML injection Commonly known as: novoLOG Inject 8 Units into the skin daily before breakfast. What changed: how much to take   insulin aspart 100 UNIT/ML injection Commonly known as: novoLOG Inject 6 Units into the skin daily before supper. What changed: how much to take   insulin NPH Human 100 UNIT/ML injection Commonly known as: NOVOLIN N Inject 0.16 mLs (16 Units total) into the skin daily before breakfast.   insulin NPH Human 100 UNIT/ML injection Commonly known as: NOVOLIN N Inject 0.06 mLs (6 Units total) into the skin daily before supper.   INSULIN SYRINGE .3CC/30GX5/16" 30G X 5/16" 0.3 ML Misc 2 Units by Does not apply route 2 (two) times daily.   prenatal vitamin w/FE, FA 29-1 MG Chew chewable tablet Chew 2 tablets by mouth daily at 12 noon.         Total time spent taking care of this patient: 30 minutes. I have reviewed the IOL process with her and described the use of Cytotec, the possibility of a foley ball, and pitocin with her. Her questions have  been answered.  Signed: Mirna Mires, CNM  10/10/2022, 6:26 PM

## 2022-10-11 ENCOUNTER — Encounter: Payer: Self-pay | Admitting: Obstetrics and Gynecology

## 2022-10-11 ENCOUNTER — Other Ambulatory Visit: Payer: Self-pay

## 2022-10-11 ENCOUNTER — Encounter: Payer: BC Managed Care – PPO | Admitting: Physical Therapy

## 2022-10-11 ENCOUNTER — Encounter: Payer: Self-pay | Admitting: Certified Nurse Midwife

## 2022-10-11 DIAGNOSIS — O24313 Unspecified pre-existing diabetes mellitus in pregnancy, third trimester: Secondary | ICD-10-CM

## 2022-10-11 DIAGNOSIS — O99210 Obesity complicating pregnancy, unspecified trimester: Secondary | ICD-10-CM

## 2022-10-11 DIAGNOSIS — I10 Essential (primary) hypertension: Secondary | ICD-10-CM

## 2022-10-11 DIAGNOSIS — O403XX0 Polyhydramnios, third trimester, not applicable or unspecified: Secondary | ICD-10-CM

## 2022-10-12 ENCOUNTER — Ambulatory Visit (INDEPENDENT_AMBULATORY_CARE_PROVIDER_SITE_OTHER): Payer: BC Managed Care – PPO | Admitting: Obstetrics

## 2022-10-12 ENCOUNTER — Ambulatory Visit (HOSPITAL_BASED_OUTPATIENT_CLINIC_OR_DEPARTMENT_OTHER): Payer: BC Managed Care – PPO

## 2022-10-12 ENCOUNTER — Encounter: Payer: BC Managed Care – PPO | Admitting: Physical Therapy

## 2022-10-12 ENCOUNTER — Encounter: Payer: Self-pay | Admitting: Obstetrics

## 2022-10-12 VITALS — BP 126/79 | HR 97 | Wt 252.0 lb

## 2022-10-12 VITALS — BP 131/83 | HR 103 | Temp 98.1°F | Ht 69.0 in | Wt 253.0 lb

## 2022-10-12 DIAGNOSIS — E119 Type 2 diabetes mellitus without complications: Secondary | ICD-10-CM

## 2022-10-12 DIAGNOSIS — O24113 Pre-existing diabetes mellitus, type 2, in pregnancy, third trimester: Secondary | ICD-10-CM | POA: Insufficient documentation

## 2022-10-12 DIAGNOSIS — O09293 Supervision of pregnancy with other poor reproductive or obstetric history, third trimester: Secondary | ICD-10-CM | POA: Insufficient documentation

## 2022-10-12 DIAGNOSIS — Z3A36 36 weeks gestation of pregnancy: Secondary | ICD-10-CM | POA: Insufficient documentation

## 2022-10-12 DIAGNOSIS — O3663X0 Maternal care for excessive fetal growth, third trimester, not applicable or unspecified: Secondary | ICD-10-CM | POA: Insufficient documentation

## 2022-10-12 DIAGNOSIS — O113 Pre-existing hypertension with pre-eclampsia, third trimester: Secondary | ICD-10-CM | POA: Insufficient documentation

## 2022-10-12 DIAGNOSIS — O99213 Obesity complicating pregnancy, third trimester: Secondary | ICD-10-CM | POA: Insufficient documentation

## 2022-10-12 DIAGNOSIS — O24313 Unspecified pre-existing diabetes mellitus in pregnancy, third trimester: Secondary | ICD-10-CM

## 2022-10-12 DIAGNOSIS — E669 Obesity, unspecified: Secondary | ICD-10-CM

## 2022-10-12 DIAGNOSIS — I10 Essential (primary) hypertension: Secondary | ICD-10-CM

## 2022-10-12 DIAGNOSIS — Z794 Long term (current) use of insulin: Secondary | ICD-10-CM

## 2022-10-12 DIAGNOSIS — O403XX0 Polyhydramnios, third trimester, not applicable or unspecified: Secondary | ICD-10-CM

## 2022-10-12 DIAGNOSIS — O0990 Supervision of high risk pregnancy, unspecified, unspecified trimester: Secondary | ICD-10-CM

## 2022-10-12 DIAGNOSIS — O10013 Pre-existing essential hypertension complicating pregnancy, third trimester: Secondary | ICD-10-CM

## 2022-10-12 DIAGNOSIS — O10913 Unspecified pre-existing hypertension complicating pregnancy, third trimester: Secondary | ICD-10-CM | POA: Insufficient documentation

## 2022-10-12 DIAGNOSIS — O99210 Obesity complicating pregnancy, unspecified trimester: Secondary | ICD-10-CM

## 2022-10-12 LAB — POCT URINALYSIS DIPSTICK OB
Bilirubin, UA: NEGATIVE
Blood, UA: NEGATIVE
Glucose, UA: NEGATIVE
Ketones, UA: NEGATIVE
Leukocytes, UA: NEGATIVE
Nitrite, UA: NEGATIVE
POC,PROTEIN,UA: NEGATIVE
Spec Grav, UA: 1.015 (ref 1.010–1.025)
Urobilinogen, UA: 0.2 E.U./dL
pH, UA: 6 (ref 5.0–8.0)

## 2022-10-12 NOTE — Progress Notes (Signed)
Routine Prenatal Care Visit  Subjective  Penny Gonzalez is a 27 y.o. [phone] at [redacted]w[redacted]d being seen today for ongoing prenatal care.  She is currently monitored for the following issues for this high-risk pregnancy and has Pre-existing diabetes mellitus during pregnancy in third trimester; Vitamin D deficiency; Proteinuria; Essential hypertension, benign; Fatty liver; Acanthosis nigricans; Obesity in pregnancy; Recurrent major depressive disorder, in partial remission (HCC); Supervision of high risk pregnancy, antepartum; History of preterm delivery, currently pregnant; History of pre-eclampsia in prior pregnancy, currently pregnant; Preexisting diabetes complicating pregnancy, antepartum; Unwanted fertility; Diabetes mellitus without complication (HCC); Polyhydramnios affecting pregnancy in third trimester; Pre-existing essential hypertension during pregnancy in third trimester; Positive GBS test; [redacted] weeks gestation of pregnancy; and Non-reactive NST (non-stress test) on their problem list.  ----------------------------------------------------------------------------------- Patient reports backache, occasional contractions, and she is eager for her IOL this Saturday morning. .   Contractions: Irritability. Vag. Bleeding: None.  Movement: Present. Leaking Fluid denies.  ----------------------------------------------------------------------------------- The following portions of the patient's history were reviewed and updated as appropriate: allergies, current medications, past family history, past medical history, past social history, past surgical history and problem list. Problem list updated.  Objective  Blood pressure 126/79, pulse 97, weight 252 lb (114.3 kg), last menstrual period 01/29/2022. Pregravid weight 214 lb (97.1 kg) Total Weight Gain 38 lb (17.2 kg) Urinalysis: Urine Protein    Urine Glucose    Fetal Status:     Movement: Present     General:  Alert, oriented and cooperative.  Patient is in no acute distress.  Skin: Skin is warm and dry. No rash noted.   Cardiovascular: Normal heart rate noted  Respiratory: Normal respiratory effort, no problems with respiration noted  Abdomen: Soft, gravid, appropriate for gestational age. Pain/Pressure: Present     Pelvic:  Cervical exam deferred        Extremities: Normal range of motion.     Mental Status: Normal mood and affect. Normal behavior. Normal judgment and thought content.   Assessment   27 y.o. [id number] at [redacted]w[redacted]d by  11/05/2022, by Last Menstrual Period presenting for routine prenatal visit  Plan   four Problems (from 04/28/22 to present)     Problem Noted Resolved   Polyhydramnios affecting pregnancy in third trimester 09/23/2022 by Ellwood Sayers, CNM No   Supervision of high risk pregnancy, antepartum 04/28/2022 by Loran Senters, CMA No   Overview Addendum 09/28/2022  3:05 PM by Marny Lowenstein, PA-C     Clinical Staff Provider  Office Location  [facility] Dating  11/05/2022, by Last Menstrual Period   Language  English Anatomy US  Normal   Flu Vaccine  offer Genetic Screen  NIPS:   TDaP vaccine   08/19/2022 Hgb A1C or  GTT Early : Third trimester : N/A  Covid declined   LAB RESULTS   Rhogam  --/--/O POS Performed at [facility], [address] [place], [place] [address]  [phone])  Blood Type O+  Feeding Plan both Antibody  Negative  Contraception BTL  Rubella  4.09  Circumcision yes RPR Non Reactive (03/08 1209)   Pediatrician  undecided HBsAg   Negative  Support Person Ethelene Browns HIV  Negative  Prenatal Classes yes Varicella 212    GBS  (For PCN allergy, check sensitivities)   BTL Consent Signed 07/27/22 Hep C   Non Reactive  VBAC Consent NA Pap Diagnosis  Date Value Ref Range Status  11/28/2018   Final   NEGATIVE FOR INTRAEPITHELIAL LESIONS OR  MALIGNANCY.  11/28/2018   Final   FUNGAL ORGANISMS PRESENT CONSISTENT WITH CANDIDA SPP.      Hgb Electro      CF      SMA                    Term labor symptoms and general obstetric precautions including but not limited to vaginal bleeding, contractions, leaking of fluid and fetal movement were reviewed in detail with the patient. Please refer to After Visit Summary for other counseling recommendations.  Her IOL is discussed, and a description of the use of Cytotec, pitocin and pain med options.she plans an epidural.  No follow-ups on file.  Mirna Mires, CNM  10/12/2022 9:25 AM

## 2022-10-15 ENCOUNTER — Encounter: Payer: Self-pay | Admitting: Obstetrics and Gynecology

## 2022-10-15 ENCOUNTER — Inpatient Hospital Stay: Payer: BC Managed Care – PPO | Admitting: Anesthesiology

## 2022-10-15 ENCOUNTER — Inpatient Hospital Stay
Admission: EM | Admit: 2022-10-15 | Discharge: 2022-10-17 | DRG: 806 | Disposition: A | Discharge status: Home / Self Care | Payer: BC Managed Care – PPO | Attending: Obstetrics and Gynecology | Admitting: Obstetrics and Gynecology

## 2022-10-15 ENCOUNTER — Other Ambulatory Visit: Payer: Self-pay

## 2022-10-15 DIAGNOSIS — O99214 Obesity complicating childbirth: Secondary | ICD-10-CM | POA: Diagnosis present

## 2022-10-15 DIAGNOSIS — O2412 Pre-existing diabetes mellitus, type 2, in childbirth: Secondary | ICD-10-CM | POA: Diagnosis present

## 2022-10-15 DIAGNOSIS — M5432 Sciatica, left side: Secondary | ICD-10-CM | POA: Diagnosis present

## 2022-10-15 DIAGNOSIS — O99820 Streptococcus B carrier state complicating pregnancy: Secondary | ICD-10-CM | POA: Diagnosis not present

## 2022-10-15 DIAGNOSIS — Z7982 Long term (current) use of aspirin: Secondary | ICD-10-CM | POA: Diagnosis not present

## 2022-10-15 DIAGNOSIS — Z794 Long term (current) use of insulin: Secondary | ICD-10-CM | POA: Diagnosis not present

## 2022-10-15 DIAGNOSIS — E1165 Type 2 diabetes mellitus with hyperglycemia: Secondary | ICD-10-CM | POA: Diagnosis present

## 2022-10-15 DIAGNOSIS — Z3490 Encounter for supervision of normal pregnancy, unspecified, unspecified trimester: Secondary | ICD-10-CM

## 2022-10-15 DIAGNOSIS — O3663X0 Maternal care for excessive fetal growth, third trimester, not applicable or unspecified: Secondary | ICD-10-CM | POA: Diagnosis present

## 2022-10-15 DIAGNOSIS — E669 Obesity, unspecified: Secondary | ICD-10-CM

## 2022-10-15 DIAGNOSIS — E119 Type 2 diabetes mellitus without complications: Secondary | ICD-10-CM | POA: Diagnosis not present

## 2022-10-15 DIAGNOSIS — Z8249 Family history of ischemic heart disease and other diseases of the circulatory system: Secondary | ICD-10-CM

## 2022-10-15 DIAGNOSIS — B951 Streptococcus, group B, as the cause of diseases classified elsewhere: Secondary | ICD-10-CM | POA: Diagnosis present

## 2022-10-15 DIAGNOSIS — O3660X0 Maternal care for excessive fetal growth, unspecified trimester, not applicable or unspecified: Secondary | ICD-10-CM | POA: Insufficient documentation

## 2022-10-15 DIAGNOSIS — O403XX0 Polyhydramnios, third trimester, not applicable or unspecified: Secondary | ICD-10-CM | POA: Diagnosis present

## 2022-10-15 DIAGNOSIS — Z3A37 37 weeks gestation of pregnancy: Secondary | ICD-10-CM

## 2022-10-15 DIAGNOSIS — O99210 Obesity complicating pregnancy, unspecified trimester: Secondary | ICD-10-CM | POA: Diagnosis present

## 2022-10-15 DIAGNOSIS — O24319 Unspecified pre-existing diabetes mellitus in pregnancy, unspecified trimester: Secondary | ICD-10-CM

## 2022-10-15 DIAGNOSIS — O99354 Diseases of the nervous system complicating childbirth: Secondary | ICD-10-CM | POA: Diagnosis present

## 2022-10-15 DIAGNOSIS — O09299 Supervision of pregnancy with other poor reproductive or obstetric history, unspecified trimester: Secondary | ICD-10-CM

## 2022-10-15 DIAGNOSIS — O99824 Streptococcus B carrier state complicating childbirth: Secondary | ICD-10-CM | POA: Diagnosis present

## 2022-10-15 DIAGNOSIS — O0990 Supervision of high risk pregnancy, unspecified, unspecified trimester: Principal | ICD-10-CM

## 2022-10-15 DIAGNOSIS — Z8759 Personal history of other complications of pregnancy, childbirth and the puerperium: Secondary | ICD-10-CM

## 2022-10-15 HISTORY — DX: Unspecified pre-existing diabetes mellitus in pregnancy, unspecified trimester: O24.319

## 2022-10-15 LAB — GLUCOSE, CAPILLARY
Glucose-Capillary: 217 mg/dL — ABNORMAL HIGH (ref 70–99)
Glucose-Capillary: 196 mg/dL — ABNORMAL HIGH (ref 70–99)
Glucose-Capillary: 179 mg/dL — ABNORMAL HIGH (ref 70–99)
Glucose-Capillary: 147 mg/dL — ABNORMAL HIGH (ref 70–99)
Glucose-Capillary: 112 mg/dL — ABNORMAL HIGH (ref 70–99)
Glucose-Capillary: 114 mg/dL — ABNORMAL HIGH (ref 70–99)
Glucose-Capillary: 118 mg/dL — ABNORMAL HIGH (ref 70–99)
Glucose-Capillary: 112 mg/dL — ABNORMAL HIGH (ref 70–99)
Glucose-Capillary: 124 mg/dL — ABNORMAL HIGH (ref 70–99)
Glucose-Capillary: 116 mg/dL — ABNORMAL HIGH (ref 70–99)
Glucose-Capillary: 88 mg/dL (ref 70–99)
Glucose-Capillary: 76 mg/dL (ref 70–99)
Glucose-Capillary: 96 mg/dL (ref 70–99)
Glucose-Capillary: 110 mg/dL — ABNORMAL HIGH (ref 70–99)
Glucose-Capillary: 98 mg/dL (ref 70–99)
Glucose-Capillary: 99 mg/dL (ref 70–99)

## 2022-10-15 LAB — BASIC METABOLIC PANEL
Anion gap: 9 (ref 5–15)
BUN: 9 mg/dL (ref 6–20)
CO2: 19 mmol/L — ABNORMAL LOW (ref 22–32)
Calcium: 9 mg/dL (ref 8.9–10.3)
Chloride: 105 mmol/L (ref 98–111)
Creatinine, Ser: 0.61 mg/dL (ref 0.44–1.00)
GFR, Estimated: >60 mL/min (ref >60)
Glucose, Bld: 255 mg/dL — ABNORMAL HIGH (ref 70–99)
Potassium: 4.3 mmol/L (ref 3.5–5.1)
Sodium: 133 mmol/L — ABNORMAL LOW (ref 135–145)

## 2022-10-15 LAB — TYPE AND SCREEN
ABO/RH(D): O POS
Antibody Screen: NEGATIVE

## 2022-10-15 LAB — CBC
HCT: 34.3 % — ABNORMAL LOW (ref 36.0–46.0)
Hemoglobin: 11.1 g/dL — ABNORMAL LOW (ref 12.0–15.0)
MCH: 26.9 pg (ref 26.0–34.0)
MCHC: 32.4 g/dL (ref 30.0–36.0)
MCV: 83.3 fL (ref 80.0–100.0)
Platelets: 240 10*3/uL (ref 150–400)
RBC: 4.12 MIL/uL (ref 3.87–5.11)
RDW: 14.4 % (ref 11.5–15.5)
WBC: 6.4 10*3/uL (ref 4.0–10.5)
nRBC: 0 % (ref 0.0–0.2)

## 2022-10-15 LAB — RPR: RPR Ser Ql: NONREACTIVE

## 2022-10-15 MED ORDER — TERBUTALINE SULFATE 1 MG/ML IJ SOLN: 0.2500 mg | Freq: Once | INTRAMUSCULAR | Status: DC | PRN

## 2022-10-15 MED ORDER — INSULIN ASPART 100 UNIT/ML IJ SOLN: 0.0000 [IU] | Freq: Three times a day (TID) | INTRAMUSCULAR | Status: DC

## 2022-10-15 MED ORDER — SOD CITRATE-CITRIC ACID 500-334 MG/5ML PO SOLN: 30.0000 mL | ORAL | Status: DC | PRN

## 2022-10-15 MED ORDER — MISOPROSTOL 50MCG HALF TABLET
50.0000 ug | ORAL_TABLET | ORAL | Status: DC | PRN
  Administered 2022-10-15: 50 ug via VAGINAL

## 2022-10-15 MED ORDER — DIPHENHYDRAMINE HCL 25 MG PO CAPS: 25.0000 mg | ORAL_CAPSULE | Freq: Four times a day (QID) | ORAL | Status: DC | PRN

## 2022-10-15 MED ORDER — BENZOCAINE-MENTHOL 20-0.5 % EX AERO
1.0000 | INHALATION_SPRAY | CUTANEOUS | Status: DC | PRN
  Filled 2022-10-15: qty 56

## 2022-10-15 MED ORDER — SENNOSIDES-DOCUSATE SODIUM 8.6-50 MG PO TABS
2.0000 | ORAL_TABLET | Freq: Every day | ORAL | Status: DC
  Administered 2022-10-16: 2 via ORAL
  Filled 2022-10-15 (×2): qty 2

## 2022-10-15 MED ORDER — LACTATED RINGERS IV SOLN: 500.0000 mL | Freq: Once | INTRAVENOUS | Status: DC

## 2022-10-15 MED ORDER — LIDOCAINE-EPINEPHRINE (PF) 1.5 %-1:200000 IJ SOLN
INTRAMUSCULAR | Status: DC | PRN
  Administered 2022-10-15: 3 mL via EPIDURAL

## 2022-10-15 MED ORDER — ACETAMINOPHEN 325 MG PO TABS: 650.0000 mg | ORAL_TABLET | ORAL | Status: DC | PRN

## 2022-10-15 MED ORDER — SIMETHICONE 80 MG PO CHEW: 80.0000 mg | CHEWABLE_TABLET | ORAL | Status: DC | PRN

## 2022-10-15 MED ORDER — LACTATED RINGERS IV SOLN: INTRAVENOUS | Status: DC

## 2022-10-15 MED ORDER — ZOLPIDEM TARTRATE 5 MG PO TABS: 5.0000 mg | ORAL_TABLET | Freq: Every evening | ORAL | Status: DC | PRN

## 2022-10-15 MED ORDER — DEXTROSE IN LACTATED RINGERS 5 % IV SOLN: INTRAVENOUS | Status: DC

## 2022-10-15 MED ORDER — INSULIN ASPART 100 UNIT/ML IJ SOLN: 0.0000 [IU] | Freq: Every day | INTRAMUSCULAR | Status: DC

## 2022-10-15 MED ORDER — DIBUCAINE (PERIANAL) 1 % EX OINT: 1.0000 | TOPICAL_OINTMENT | CUTANEOUS | Status: DC | PRN

## 2022-10-15 MED ORDER — SODIUM CHLORIDE 0.9 % IV SOLN
INTRAVENOUS | Status: DC | PRN
  Administered 2022-10-15 (×2): 5 mL via EPIDURAL

## 2022-10-15 MED ORDER — FAMOTIDINE 20 MG PO TABS: 40.0000 mg | ORAL_TABLET | Freq: Once | ORAL | Status: DC

## 2022-10-15 MED ORDER — PHENYLEPHRINE 80 MCG/ML (10ML) SYRINGE FOR IV PUSH (FOR BLOOD PRESSURE SUPPORT): 80.0000 ug | PREFILLED_SYRINGE | INTRAVENOUS | Status: DC | PRN

## 2022-10-15 MED ORDER — IBUPROFEN 600 MG PO TABS
600.0000 mg | ORAL_TABLET | Freq: Four times a day (QID) | ORAL | Status: DC
  Administered 2022-10-15 – 2022-10-17 (×7): 600 mg via ORAL
  Filled 2022-10-15 (×7): qty 1

## 2022-10-15 MED ORDER — OXYCODONE-ACETAMINOPHEN 5-325 MG PO TABS: 2.0000 | ORAL_TABLET | ORAL | Status: DC | PRN

## 2022-10-15 MED ORDER — MISOPROSTOL 50MCG HALF TABLET
50.0000 ug | ORAL_TABLET | Freq: Once | ORAL | Status: DC
  Filled 2022-10-15: qty 1

## 2022-10-15 MED ORDER — LABETALOL HCL 5 MG/ML IV SOLN: 80.0000 mg | INTRAVENOUS | Status: DC | PRN

## 2022-10-15 MED ORDER — OXYTOCIN-SODIUM CHLORIDE 30-0.9 UT/500ML-% IV SOLN
2.5000 [IU]/h | INTRAVENOUS | Status: DC
  Filled 2022-10-15: qty 500

## 2022-10-15 MED ORDER — FENTANYL CITRATE (PF) 100 MCG/2ML IJ SOLN: 50.0000 ug | INTRAMUSCULAR | Status: DC | PRN

## 2022-10-15 MED ORDER — LACTATED RINGERS IV SOLN
500.0000 mL | INTRAVENOUS | Status: DC | PRN
  Administered 2022-10-15: 1000 mL via INTRAVENOUS

## 2022-10-15 MED ORDER — EPHEDRINE 5 MG/ML INJ: 10.0000 mg | INTRAVENOUS | Status: DC | PRN

## 2022-10-15 MED ORDER — LIDOCAINE HCL (PF) 1 % IJ SOLN
INTRAMUSCULAR | Status: DC | PRN
  Administered 2022-10-15: 3 mL via SUBCUTANEOUS

## 2022-10-15 MED ORDER — LABETALOL HCL 5 MG/ML IV SOLN: 40.0000 mg | INTRAVENOUS | Status: DC | PRN

## 2022-10-15 MED ORDER — LABETALOL HCL 5 MG/ML IV SOLN: 20.0000 mg | INTRAVENOUS | Status: DC | PRN

## 2022-10-15 MED ORDER — HYDRALAZINE HCL 20 MG/ML IJ SOLN: 10.0000 mg | INTRAMUSCULAR | Status: DC | PRN

## 2022-10-15 MED ORDER — ONDANSETRON HCL 4 MG/2ML IJ SOLN: 4.0000 mg | Freq: Four times a day (QID) | INTRAMUSCULAR | Status: DC | PRN

## 2022-10-15 MED ORDER — FENTANYL-BUPIVACAINE-NACL 0.5-0.125-0.9 MG/250ML-% EP SOLN
EPIDURAL | Status: DC | PRN
  Administered 2022-10-15: 12 mL/h via EPIDURAL

## 2022-10-15 MED ORDER — OXYTOCIN 10 UNIT/ML IJ SOLN
INTRAMUSCULAR | Status: AC
  Filled 2022-10-15: qty 2

## 2022-10-15 MED ORDER — OXYTOCIN BOLUS FROM INFUSION
333.0000 mL | Freq: Once | INTRAVENOUS | Status: AC
  Administered 2022-10-15: 333 mL via INTRAVENOUS

## 2022-10-15 MED ORDER — OXYCODONE-ACETAMINOPHEN 5-325 MG PO TABS: 1.0000 | ORAL_TABLET | ORAL | Status: DC | PRN

## 2022-10-15 MED ORDER — MISOPROSTOL 50MCG HALF TABLET
50.0000 ug | ORAL_TABLET | Freq: Once | ORAL | Status: AC
  Administered 2022-10-15: 50 ug via ORAL
  Filled 2022-10-15: qty 1

## 2022-10-15 MED ORDER — LIDOCAINE HCL (PF) 1 % IJ SOLN
30.0000 mL | INTRAMUSCULAR | Status: DC | PRN
  Filled 2022-10-15: qty 30

## 2022-10-15 MED ORDER — ONDANSETRON HCL 4 MG PO TABS: 4.0000 mg | ORAL_TABLET | ORAL | Status: DC | PRN

## 2022-10-15 MED ORDER — METOCLOPRAMIDE HCL 10 MG PO TABS: 10.0000 mg | ORAL_TABLET | Freq: Once | ORAL | Status: DC

## 2022-10-15 MED ORDER — AMMONIA AROMATIC IN INHA
RESPIRATORY_TRACT | Status: AC
  Filled 2022-10-15: qty 10

## 2022-10-15 MED ORDER — SODIUM CHLORIDE 0.9 % IV SOLN
2.0000 g | Freq: Once | INTRAVENOUS | Status: AC
  Administered 2022-10-15: 2 g via INTRAVENOUS
  Filled 2022-10-15: qty 2000

## 2022-10-15 MED ORDER — INSULIN REGULAR(HUMAN) IN NACL 100-0.9 UT/100ML-% IV SOLN
INTRAVENOUS | Status: DC
  Administered 2022-10-15: 10 [IU]/h via INTRAVENOUS
  Filled 2022-10-15: qty 100

## 2022-10-15 MED ORDER — DEXTROSE 50 % IV SOLN: 0.0000 mL | INTRAVENOUS | Status: DC | PRN

## 2022-10-15 MED ORDER — SODIUM CHLORIDE 0.9 % IV SOLN
1.0000 g | INTRAVENOUS | Status: DC
  Administered 2022-10-15 (×2): 1 g via INTRAVENOUS
  Filled 2022-10-15 (×2): qty 1000

## 2022-10-15 MED ORDER — WITCH HAZEL-GLYCERIN EX PADS
1.0000 | MEDICATED_PAD | CUTANEOUS | Status: DC | PRN
  Filled 2022-10-15: qty 100

## 2022-10-15 MED ORDER — ACETAMINOPHEN 325 MG PO TABS
650.0000 mg | ORAL_TABLET | ORAL | Status: DC | PRN
  Administered 2022-10-15: 650 mg via ORAL
  Filled 2022-10-15: qty 2

## 2022-10-15 MED ORDER — FENTANYL-BUPIVACAINE-NACL 0.5-0.125-0.9 MG/250ML-% EP SOLN: 12.0000 mL/h | EPIDURAL | Status: DC | PRN

## 2022-10-15 MED ORDER — MISOPROSTOL 200 MCG PO TABS
ORAL_TABLET | ORAL | Status: AC
  Administered 2022-10-15: 800 ug via RECTAL
  Filled 2022-10-15: qty 4

## 2022-10-15 MED ORDER — TERBUTALINE SULFATE 1 MG/ML IJ SOLN
0.2500 mg | Freq: Once | INTRAMUSCULAR | Status: DC | PRN
  Filled 2022-10-15: qty 1

## 2022-10-15 MED ORDER — DIPHENHYDRAMINE HCL 50 MG/ML IJ SOLN: 12.5000 mg | INTRAMUSCULAR | Status: DC | PRN

## 2022-10-15 MED ORDER — INSULIN ASPART 100 UNIT/ML IJ SOLN
4.0000 [IU] | Freq: Three times a day (TID) | INTRAMUSCULAR | Status: DC
  Administered 2022-10-16: 4 [IU] via SUBCUTANEOUS
  Filled 2022-10-15: qty 1

## 2022-10-15 MED ORDER — OXYTOCIN-SODIUM CHLORIDE 30-0.9 UT/500ML-% IV SOLN
1.0000 m[IU]/min | INTRAVENOUS | Status: DC
  Administered 2022-10-15: 2 m[IU]/min via INTRAVENOUS

## 2022-10-15 MED ORDER — ONDANSETRON HCL 4 MG/2ML IJ SOLN: 4.0000 mg | INTRAMUSCULAR | Status: DC | PRN

## 2022-10-15 MED ORDER — COCONUT OIL OIL: 1.0000 | TOPICAL_OIL | Status: DC | PRN

## 2022-10-15 MED ORDER — PRENATAL MULTIVITAMIN CH
1.0000 | ORAL_TABLET | Freq: Every day | ORAL | Status: DC
  Administered 2022-10-16 – 2022-10-17 (×2): 1 via ORAL
  Filled 2022-10-15 (×2): qty 1

## 2022-10-15 MED ORDER — FENTANYL-BUPIVACAINE-NACL 0.5-0.125-0.9 MG/250ML-% EP SOLN
EPIDURAL | Status: AC
  Filled 2022-10-15: qty 250

## 2022-10-15 NOTE — Progress Notes (Signed)
Intrapartum Progress Note  S: Patient doing well, no issues. Comfortable with epidural.   O: Blood pressure 114/81, pulse 85, temperature 98.7 F (37.1 C), temperature source Oral, resp. rate 18, height 5\' 8"  (1.727 m), weight 114.3 kg, last menstrual period 01/29/2022, SpO2 100 %. Gen App: NAD, comfortable Abdomen: soft, gravid FHT: baseline 135 bpm.  Accels present.  Decels absent. moderate in degree variability.   Tocometer: contractions q 2-3 minutes Cervix: 5/60-70/-3 Extremities: Nontender, no edema.  Pitocin: 2 mIU  Labs:   Latest Reference Range & Units 10/15/22 10:24 10/15/22 12:29 10/15/22 13:14  Glucose-Capillary 70 - 99 mg/dL 161 (H) 096 (H) 045 (H)  (H): Data is abnormally high  Assessment:  1: SIUP at [redacted]w[redacted]d 2. Type II DM uncontrolled on insulin 3. Polyhydramnios 4. Fetal macrosomia  Plan:  1. Continue induction of labor, augmentation with Pitocin. AROM'd with moderate amount of clear fluid 2. Continue EndoTool for management of DM.  3. Anticipate vaginal delivery   Hildred Laser, MD 10/15/2022 2:34 PM

## 2022-10-15 NOTE — Anesthesia Preprocedure Evaluation (Signed)
Anesthesia Evaluation  Patient identified by MRN, date of birth, ID band Patient awake    Reviewed: Allergy & Precautions, H&P , NPO status , Patient's Chart, lab work & pertinent test results, reviewed documented beta blocker date and time   History of Anesthesia Complications Negative for: history of anesthetic complications  Airway Mallampati: II  TM Distance: >3 FB Neck ROM: full    Dental no notable dental hx.    Pulmonary neg pulmonary ROS   Pulmonary exam normal breath sounds clear to auscultation       Cardiovascular Exercise Tolerance: Good hypertension (gestational, with a prior pregnancy; good BP during this pregnancy), (-) angina Normal cardiovascular exam(-) dysrhythmias (-) Valvular Problems/Murmurs Rhythm:regular Rate:Normal     Neuro/Psych neg Seizures PSYCHIATRIC DISORDERS  Depression     Neuromuscular disease (sciatica)    GI/Hepatic Neg liver ROS,GERD  ,,  Endo/Other  negative endocrine ROSdiabetes, Gestational    Renal/GU negative Renal ROS  negative genitourinary   Musculoskeletal   Abdominal   Peds  Hematology  (+) Blood dyscrasia, anemia   Anesthesia Other Findings Past Medical History: 12/01/2014: Anemia, iron deficiency, inadequate dietary intake No date: Depression     Comment:  patient has not been officially diagnosed but has               symptoms No date: Diabetes mellitus without complication (HCC)     Comment:  Recently changed to Glyburide and thinks it is helping 03/31/2016: Essential hypertension, benign No date: Irregular periods/menstrual cycles No date: Joint pain of leg 11/23/2015: Major depression No date: Sciatica   Reproductive/Obstetrics (+) Pregnancy                             Anesthesia Physical Anesthesia Plan  ASA: 3  Anesthesia Plan: Epidural   Post-op Pain Management:    Induction:   PONV Risk Score and Plan:   Airway  Management Planned:   Additional Equipment:   Intra-op Plan:   Post-operative Plan:   Informed Consent: I have reviewed the patients History and Physical, chart, labs and discussed the procedure including the risks, benefits and alternatives for the proposed anesthesia with the patient or authorized representative who has indicated his/her understanding and acceptance.     Dental Advisory Given  Plan Discussed with: Anesthesiologist, CRNA and Surgeon  Anesthesia Plan Comments:        Anesthesia Quick Evaluation
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
## 2022-10-15 NOTE — Progress Notes (Signed)
Pt. Sitting up in chair, FHR difficult to trace. RN continuously adjusting.

## 2022-10-15 NOTE — H&P (Signed)
Obstetric History and Physical  Penny Gonzalez is a 27 y.o. [phone] with IUP at [redacted]w[redacted]d presenting for scheduled IOL for Type II DM in pregnancy on insulin (uncontrolled), polyhydramnios, fetal macrosomia. Patient states she has been having  irregular contractions, no vaginal bleeding, intact membranes, with active fetal movement.    Prenatal Course Source of Care: [facility] with onset of care at 12 weeks Pregnancy complications or risks: Patient Active Problem List   Diagnosis Date Noted   Pre-existing diabetes mellitus in pregnancy 10/15/2022   [redacted] weeks gestation of pregnancy 10/10/2022   Non-reactive NST (non-stress test) 10/10/2022   Positive GBS test 10/06/2022   Pre-existing essential hypertension during pregnancy in third trimester 09/30/2022   Polyhydramnios affecting pregnancy in third trimester 09/23/2022   Diabetes mellitus without complication (HCC) 08/17/2022   Preexisting diabetes complicating pregnancy, antepartum 07/27/2022   Unwanted fertility 07/27/2022   History of preterm delivery, currently pregnant 05/11/2022   History of pre-eclampsia in prior pregnancy, currently pregnant 05/11/2022   Supervision of high risk pregnancy, antepartum 04/28/2022   Recurrent major depressive disorder, in partial remission (HCC) 12/12/2018   Obesity in pregnancy 11/29/2017   Acanthosis nigricans 06/27/2017   Fatty liver 05/07/2016   Essential hypertension, benign 03/31/2016   Proteinuria 01/30/2016   Vitamin D deficiency 11/19/2015   Pre-existing diabetes mellitus during pregnancy in third trimester 06/24/2014   She plans to breastfeed She desires bilateral tubal ligation for postpartum contraception.   Prenatal labs and studies: ABO, Rh: --/--/O POS (05/04 0052) Antibody: NEG (05/04 0052) Rubella:  I mmune RPR: NON REACTIVE (05/04 0037)  HBsAg:  Negative  HIV: Negative   AVW:UJWJXBJY/-- (04/24 1406) 1 hr Glucola  not performed Genetic screening normal Anatomy US  normal   Past Medical History:  Diagnosis Date   Anemia, iron deficiency, inadequate dietary intake 12/01/2014   Depression    patient has not been officially diagnosed but has symptoms   Diabetes mellitus without complication (HCC)    Recently changed to Glyburide and thinks it is helping   Essential hypertension, benign 03/31/2016   Irregular periods/menstrual cycles    Joint pain of leg    Major depression 11/23/2015   Sciatica     Past Surgical History:  Procedure Laterality Date   CHOLECYSTECTOMY N/A 05/19/2016   Procedure: LAPAROSCOPIC CHOLECYSTECTOMY WITH INTRAOPERATIVE CHOLANGIOGRAM;  Surgeon: Nadeen Landau, MD;  Location: [facility] ORS;  Service: General;  Laterality: N/A;    OB History  Gravida Para Term Preterm AB Living  4 1   1 2 1   SAB IAB Ectopic Multiple Live Births    2     1    # Outcome Date GA Lbr Len/2nd Weight Sex Delivery Anes PTL Lv  4 Current           3 IAB 07/31/21     TAB     2 IAB 01/04/20     TAB     1 Preterm 08/06/14 [redacted]w[redacted]d  4054 g F Vag-Spont  Y LIV     Complications: Pre-eclampsia    Social History   Socioeconomic History   Marital status: Single    Spouse name: Not on file   Number of children: 1   Years of education: 13   Highest education level: High school graduate  Occupational History   Occupation: [employer]  Tobacco Use   Smoking status: Never   Smokeless tobacco: Never  Vaping Use   Vaping Use: Never used  Substance and Sexual Activity  Alcohol use: Not Currently    Comment: OCC   Drug use: No   Sexual activity: Yes    Partners: Male    Birth control/protection: Surgical    Comment: tubal  Other Topics Concern   Not on file  Social History Narrative   Not on file   Social Determinants of Health   Financial Resource Strain: Low Risk  (04/28/2022)   Overall Financial Resource Strain (CARDIA)    Difficulty of Paying Living Expenses: Not hard at all  Food Insecurity: No Food Insecurity (10/15/2022)   Hunger  Vital Sign    Worried About Running Out of Food in the Last Year: Never true    Ran Out of Food in the Last Year: Never true  Transportation Needs: No Transportation Needs (10/15/2022)   PRAPARE - Administrator, Civil Service (Medical): No    Lack of Transportation (Non-Medical): No  Physical Activity: Inactive (04/28/2022)   Exercise Vital Sign    Days of Exercise per Week: 0 days    Minutes of Exercise per Session: 0 min  Stress: No Stress Concern Present (04/28/2022)   [employer] of Occupational Health - Occupational Stress Questionnaire    Feeling of Stress : Only a little  Social Connections: Moderately Integrated (04/28/2022)   Social Connection and Isolation Panel [NHANES]    Frequency of Communication with Friends and Family: More than three times a week    Frequency of Social Gatherings with Friends and Family: Once a week    Attends Religious Services: 1 to 4 times per year    Active Member of [employer] or Organizations: No    Attends Engineer, structural: Never    Marital Status: Living with partner    Family History  Problem Relation Age of Onset   Diabetes Mother    Arthritis Father    Diabetes Father    Heart disease Father    Hypertension Father    Stroke Father    Lupus Sister    Deafness Sister    Diabetes Maternal Grandmother    Hypertension Maternal Grandmother    Heart disease Paternal Grandmother    Hypertension Paternal Grandmother     Medications Prior to Admission  Medication Sig Dispense Refill Last Dose   aspirin EC 81 MG tablet Take 1 tablet (81 mg total) by mouth daily. Swallow whole. 90 tablet 4 Past Week   ferrous sulfate (FERROUSUL) 325 (65 FE) MG tablet Take 1 tablet (325 mg total) by mouth 2 (two) times daily. 60 tablet 1 Past Week   insulin NPH Human (NOVOLIN N) 100 UNIT/ML injection Inject 0.16 mLs (16 Units total) into the skin daily before breakfast. 10 mL 11 10/14/2022   insulin NPH Human (NOVOLIN N) 100 UNIT/ML  injection Inject 0.06 mLs (6 Units total) into the skin daily before supper. 10 mL 11 10/14/2022   prenatal vitamin w/FE, FA (NATACHEW) 29-1 MG CHEW chewable tablet Chew 2 tablets by mouth daily at 12 noon.   Past Week   Accu-Chek Softclix Lancets lancets Use to check blood sugar 4 times daily. 100 each 12    blood glucose meter kit and supplies KIT Dispense based on patient and insurance preference. Use daily in the am before breakfast.  For Dx DM type 2, E11.69 1 each 0    Blood Glucose Monitoring Suppl (ACCU-CHEK GUIDE) w/Device KIT Use glucose meter to check blood sugar 4 times daily. 1 kit 0    glucose blood (ACCU-CHEK GUIDE) test strip  Use 1 strip to check blood sugar 4 times daily 100 each 12    insulin aspart (NOVOLOG) 100 UNIT/ML injection Inject 8 Units into the skin daily before breakfast. (Patient taking differently: Inject 10 Units into the skin daily before breakfast.) 10 mL 11    insulin aspart (NOVOLOG) 100 UNIT/ML injection Inject 6 Units into the skin daily before supper. (Patient taking differently: Inject 10 Units into the skin daily before supper.) 10 mL 11    Insulin Syringe-Needle U-100 (INSULIN SYRINGE .3CC/30GX5/16") 30G X 5/16" 0.3 ML MISC 2 Units by Does not apply route 2 (two) times daily. 60 each 3     Allergies  Allergen Reactions   Pork-Derived Products Other (See Comments)    Not allergy- patient preference    Latex Rash    Pruritic, urticaria    Review of Systems: Negative except for what is mentioned in HPI.  Physical Exam: BP 125/68 (BP Location: Left Arm)   Pulse 91   Temp 98.2 F (36.8 C) (Oral)   Resp 18   Ht 5\' 8"  (1.727 m)   Wt 114.3 kg   LMP 01/29/2022 (Exact Date)   BMI 38.32 kg/m  CONSTITUTIONAL: Well-developed, well-nourished female in no acute distress.  HENT:  Normocephalic, atraumatic, External right and left ear normal. Oropharynx is clear and moist EYES: Conjunctivae and EOM are normal. Pupils are equal, round, and reactive to light. No  scleral icterus.  NECK: Normal range of motion, supple, no masses SKIN: Skin is warm and dry. No rash noted. Not diaphoretic. No erythema. No pallor. NEUROLOGIC: Alert and oriented to person, place, and time. Normal reflexes, muscle tone coordination. No cranial nerve deficit noted. PSYCHIATRIC: Normal mood and affect. Normal behavior. Normal judgment and thought content. CARDIOVASCULAR: Normal heart rate noted, regular rhythm RESPIRATORY: Effort and breath sounds normal, no problems with respiration noted ABDOMEN: Soft, nontender, nondistended, gravid. MUSCULOSKELETAL: Normal range of motion. No edema and no tenderness. 2+ distal pulses.  Cervical Exam: Dilatation 3 cm   Effacement 50%   Station -3   Presentation: cephalic FHT:  Baseline rate 140 bpm   Variability moderate  Accelerations present   Decelerations none Contractions: Every 2-5 mins   Pertinent Labs/Studies:   Results for orders placed or performed during the hospital encounter of 10/15/22 (from the past 24 hour(s))  CBC     Status: Abnormal   Collection Time: 10/15/22 12:37 AM  Result Value Ref Range   WBC 6.4 4.0 - 10.5 K/uL   RBC 4.12 3.87 - 5.11 MIL/uL   Hemoglobin 11.1 (L) 12.0 - 15.0 g/dL   HCT 16.1 (L) 09.6 - 04.5 %   MCV 83.3 80.0 - 100.0 fL   MCH 26.9 26.0 - 34.0 pg   MCHC 32.4 30.0 - 36.0 g/dL   RDW 40.9 81.1 - 91.4 %   Platelets 240 150 - 400 K/uL   nRBC 0.0 0.0 - 0.2 %  RPR     Status: None   Collection Time: 10/15/22 12:37 AM  Result Value Ref Range   RPR Ser Ql NON REACTIVE NON REACTIVE  Type and screen     Status: None   Collection Time: 10/15/22 12:52 AM  Result Value Ref Range   ABO/RH(D) O POS    Antibody Screen NEG    Sample Expiration      10/18/2022,2359 Performed at [facility], [address], [place], [place] [address]   Basic metabolic panel     Status: Abnormal   Collection Time:  10/15/22 12:52 AM  Result Value Ref Range   Sodium 133 (L) 135 - 145 mmol/L   Potassium 4.3  3.5 - 5.1 mmol/L   Chloride 105 98 - 111 mmol/L   CO2 19 (L) 22 - 32 mmol/L   Glucose, Bld 255 (H) 70 - 99 mg/dL   BUN 9 6 - 20 mg/dL   Creatinine, Ser 1.61 0.44 - 1.00 mg/dL   Calcium 9.0 8.9 - 09.6 mg/dL   GFR, Estimated >04 >54 mL/min   Anion gap 9 5 - 15  Glucose, capillary     Status: Abnormal   Collection Time: 10/15/22  2:05 AM  Result Value Ref Range   Glucose-Capillary 217 (H) 70 - 99 mg/dL  Glucose, capillary     Status: Abnormal   Collection Time: 10/15/22  2:50 AM  Result Value Ref Range   Glucose-Capillary 196 (H) 70 - 99 mg/dL  Glucose, capillary     Status: Abnormal   Collection Time: 10/15/22  3:55 AM  Result Value Ref Range   Glucose-Capillary 179 (H) 70 - 99 mg/dL  Glucose, capillary     Status: Abnormal   Collection Time: 10/15/22  5:04 AM  Result Value Ref Range   Glucose-Capillary 147 (H) 70 - 99 mg/dL  Glucose, capillary     Status: Abnormal   Collection Time: 10/15/22  6:17 AM  Result Value Ref Range   Glucose-Capillary 112 (H) 70 - 99 mg/dL  Glucose, capillary     Status: Abnormal   Collection Time: 10/15/22  7:17 AM  Result Value Ref Range   Glucose-Capillary 114 (H) 70 - 99 mg/dL  Glucose, capillary     Status: Abnormal   Collection Time: 10/15/22  8:21 AM  Result Value Ref Range   Glucose-Capillary 118 (H) 70 - 99 mg/dL     Imaging:  [place] MFM FETAL BPP WO NON STRESS ----------------------------------------------------------------------  OBSTETRICS REPORT                       (Signed Final 10/12/2022 12:26 pm) ---------------------------------------------------------------------- Patient Info  ID #:       [id number]                          D.O.B.:  09-16-1995 (27 yrs)  Name:       Penny Gonzalez                      Visit Date: 10/12/2022 11:03 am              [place] ---------------------------------------------------------------------- Performed By  Attending:        Lin Landsman      Ref. Address:     Salomon Mast                     MD  Performed By:     Eden Lathe BS      [facility]                    RD[facility] RVT                                 [facility] at  [facility]  Referred By:      Hildred Laser                    MD ---------------------------------------------------------------------- Orders  #  Description                           Code        Ordered By  1  [place] MFM OB FOLLOW UP                   40981.19    Lin Landsman  2  [place] MFM FETAL BPP WO NON               76819.01    Penny Gonzalez     STRESS                                            Penny Gonzalez ----------------------------------------------------------------------  #  Order #                     Accession #                Episode #  1  [id number]                   [id number]                 [id number]  2  [id number]                   [id number]                 [id number] ---------------------------------------------------------------------- Indications  Pre-existing diabetes, type 2, in pregnancy,   O24.113  third trimester  Hypertension - Chronic/Pre-existing            O10.019  Polyhydramnios, third trimester, antepartum    O40.3XX0  condition or complication, unspecified fetus  Obesity complicating pregnancy, third          O99.213  trimester  Suspected fetal macrosomia                     O36.60X0  Poor obstetric history: Previous               O09.299  preeclampsia / eclampsia/gestational HTN  [redacted] weeks gestation of pregnancy                Z3A.36 ---------------------------------------------------------------------- Vital Signs                                                 Height:        5'8" ---------------------------------------------------------------------- Fetal Evaluation  Num Of Fetuses:         1  Fetal Heart Rate(bpm):  160  Cardiac Activity:       Observed  Presentation:  Cephalic  Placenta:               Anterior  P. Cord Insertion:      Previously visualized  Amniotic Fluid  AFI FV:      Within normal limits  AFI Sum(cm)     %Tile       Largest Pocket(cm)  20.14           77          7.25  RUQ(cm)       RLQ(cm)       LUQ(cm)        LLQ(cm)  7.06          3.96          7.25           1.87 ---------------------------------------------------------------------- Biophysical Evaluation  Amniotic F.V:   Within normal limits       F. Tone:        Observed  F. Movement:    Observed                   Score:          8/8  F. Breathing:   Observed ---------------------------------------------------------------------- Biometry  BPD:     91.38  mm     G. Age:  37w 1d         76  %    CI:        73.22   %    70 - 86                                                          FL/HC:      20.8   %    20.8 - 22.6  HC:      339.4  mm     G. Age:  39w 0d         80  %    HC/AC:      0.85        0.92 - 1.05  AC:    398.98   mm     G. Age:  43w 6d       > 99  %    FL/BPD:     77.2   %    71 - 87  FL:      70.52  mm     G. Age:  36w 1d         36  %    FL/AC:      17.7   %    20 - 24  Est. FW:    4284  gm      9 lb 7 oz   > 99  % ---------------------------------------------------------------------- OB History  Gravidity:    4         Prem:   1         SAB:   2  Living:       1 ---------------------------------------------------------------------- Gestational Age  LMP:           36w 4d        Date:  01/29/22                  EDD:   11/05/22  U/S Today:     39w  0d                                        EDD:   10/19/22  Best:          36w 4d     Det. By:  LMP  (01/29/22)          EDD:   11/05/22 ---------------------------------------------------------------------- Anatomy  Cranium:               Appears normal         Aortic Arch:            Previously seen  Cavum:                 Previously seen        Ductal Arch:            Previously seen  Ventricles:            Previously  seen        Diaphragm:              Previously seen  Choroid Plexus:        Previously seen        Stomach:                Appears normal, left                                                                        sided  Cerebellum:            Previously seen        Abdomen:                Appears normal  Posterior Fossa:       Previously seen        Abdominal Wall:         Previously seen  Nuchal Fold:           Previously seen        Cord Vessels:           Previously seen  Face:                  Appears normal         Kidneys:                Appear normal                         (orbits and profile)  Lips:                  Previously seen        Bladder:                Appears normal  Thoracic:              Appears normal         Spine:                  Previously seen  Heart:  Previously seen        Upper Extremities:      Previously seen  RVOT:                  Previously seen        Lower Extremities:      Previously seen  LVOT:                  Previously seen  Other:  Female gender previously seen. Heels/feet and open hands/5th digits          previously visualized. Nasal bone, lenses, maxilla, mandible and falx          previously visualized VC, 3VV and 3VTV previously visualized. ---------------------------------------------------------------------- Cervix Uterus Adnexa  Cervix  Not visualized (advanced GA >24wks)  Uterus  No abnormality visualized.  Right Ovary  Not visualized.  Left Ovary  Not visualized.  Cul De Sac  No free fluid seen.  Adnexa  No abnormality visualized ---------------------------------------------------------------------- Impression  Follow up growth due to T2DM, CHTN and suspected fetal  macrosomia  Normal interval growth with measurements consistent with  large for gestational (EFW 9.5 lb and EFW 99th%)  Good fetal movement and amniotic fluid volume  (polyhydramnios resolved).  Biophysical profile  8/8 ---------------------------------------------------------------------- Recommendations  Planned delivery within the week. ----------------------------------------------------------------------              Lin Landsman, MD Electronically Signed Final Report   10/12/2022 12:26 pm ---------------------------------------------------------------------- [place] MFM OB FOLLOW UP ----------------------------------------------------------------------  OBSTETRICS REPORT                       (Signed Final 10/12/2022 12:26 pm) ---------------------------------------------------------------------- Patient Info  ID #:       [id number]                          D.O.B.:  10-13-1995 (27 yrs)  Name:       Penny Gonzalez                      Visit Date: 10/12/2022 11:03 am              Penny Gonzalez ---------------------------------------------------------------------- Performed By  Attending:        Lin Landsman      Ref. Address:     Salomon Mast                    MD  Performed By:     Eden Lathe BS      [facility]         [facility]                    RDMS RVT                                 [facility] Care at                                                             [facility]  Referred By:      Hildred Laser  MD ---------------------------------------------------------------------- Orders  #  Description                           Code        Ordered By  1  [place] MFM OB FOLLOW UP                   76816.01    Lin Landsman  2  [place] MFM FETAL BPP WO NON               76819.01    Penny Gonzalez     STRESS                                            Penny Gonzalez ----------------------------------------------------------------------  #  Order #                     Accession #                Episode #  1  [id number]                   [id number]                 [id number]  2  [id number]                   [id number]                  [id number] ---------------------------------------------------------------------- Indications  Pre-existing diabetes, type 2, in pregnancy,   O24.113  third trimester  Hypertension - Chronic/Pre-existing            O10.019  Polyhydramnios, third trimester, antepartum    O40.3XX0  condition or complication, unspecified fetus  Obesity complicating pregnancy, third          O99.213  trimester  Suspected fetal macrosomia                     O36.60X0  Poor obstetric history: Previous               O09.299  preeclampsia / eclampsia/gestational HTN  [redacted] weeks gestation of pregnancy                Z3A.36 ---------------------------------------------------------------------- Vital Signs                                                 Height:        5'8" ---------------------------------------------------------------------- Fetal Evaluation  Num Of Fetuses:         1  Fetal Heart Rate(bpm):  160  Cardiac Activity:       Observed  Presentation:           Cephalic  Placenta:               Anterior  P. Cord Insertion:      Previously visualized  Amniotic Fluid  AFI FV:      Within normal limits  AFI Sum(cm)     %Tile       Largest Pocket(cm)  20.14           77          7.25  RUQ(cm)       RLQ(cm)       LUQ(cm)        LLQ(cm)  7.06          3.96          7.25           1.87 ---------------------------------------------------------------------- Biophysical Evaluation  Amniotic F.V:   Within normal limits       F. Tone:        Observed  F. Movement:    Observed                   Score:          8/8  F. Breathing:   Observed ---------------------------------------------------------------------- Biometry  BPD:     91.38  mm     G. Age:  37w 1d         76  %    CI:        73.22   %    70 - 86                                                          FL/HC:      20.8   %    20.8 - 22.6  HC:      339.4  mm     G. Age:  39w 0d         80  %    HC/AC:      0.85        0.92 - 1.05  AC:    398.98   mm      G. Age:  43w 6d       > 99  %    FL/BPD:     77.2   %    71 - 87  FL:      70.52  mm     G. Age:  36w 1d         36  %    FL/AC:      17.7   %    20 - 24  Est. FW:    4284  gm      9 lb 7 oz   > 99  % ---------------------------------------------------------------------- OB History  Gravidity:    4         Prem:   1         SAB:   2  Living:       1 ---------------------------------------------------------------------- Gestational Age  LMP:           36w 4d        Date:  01/29/22                  EDD:   11/05/22  U/S Today:     39w 0d  EDD:   10/19/22  Best:          36w 4d     Det. By:  LMP  (01/29/22)          EDD:   11/05/22 ---------------------------------------------------------------------- Anatomy  Cranium:               Appears normal         Aortic Arch:            Previously seen  Cavum:                 Previously seen        Ductal Arch:            Previously seen  Ventricles:            Previously seen        Diaphragm:              Previously seen  Choroid Plexus:        Previously seen        Stomach:                Appears normal, left                                                                        sided  Cerebellum:            Previously seen        Abdomen:                Appears normal  Posterior Fossa:       Previously seen        Abdominal Wall:         Previously seen  Nuchal Fold:           Previously seen        Cord Vessels:           Previously seen  Face:                  Appears normal         Kidneys:                Appear normal                         (orbits and profile)  Lips:                  Previously seen        Bladder:                Appears normal  Thoracic:              Appears normal         Spine:                  Previously seen  Heart:                 Previously seen        Upper Extremities:      Previously seen  RVOT:                  Previously  seen        Lower Extremities:      Previously seen   LVOT:                  Previously seen  Other:  Female gender previously seen. Heels/feet and open hands/5th digits          previously visualized. Nasal bone, lenses, maxilla, mandible and falx          previously visualized VC, 3VV and 3VTV previously visualized. ---------------------------------------------------------------------- Cervix Uterus Adnexa  Cervix  Not visualized (advanced GA >24wks)  Uterus  No abnormality visualized.  Right Ovary  Not visualized.  Left Ovary  Not visualized.  Cul De Sac  No free fluid seen.  Adnexa  No abnormality visualized ---------------------------------------------------------------------- Impression  Follow up growth due to T2DM, CHTN and suspected fetal  macrosomia  Normal interval growth with measurements consistent with  large for gestational (EFW 9.5 lb and EFW 99th%)  Good fetal movement and amniotic fluid volume  (polyhydramnios resolved).  Biophysical profile 8/8 ---------------------------------------------------------------------- Recommendations  Planned delivery within the week. ----------------------------------------------------------------------              Lin Landsman, MD Electronically Signed Final Report   10/12/2022 12:26 pm ----------------------------------------------------------------------   Assessment : JORRYN HADDOW is a 27 y.o. [phone] at [redacted]w[redacted]d being admitted for induction of labor due to uncontrolled Type II DM in pregnancy, polyhydramnios, fetal macrosomia.  History of pre-eclampsia and preterm delivery in last pregnancy.   Plan: Labor:  Induction/Augmentation as ordered as per protocol. Has received 1 dose of Cytotec. Will proceed to Pitocin for augmentation. Analgesia as needed. FWB: Reassuring fetal heart tracing.  GBS positive. Ampicillin for GBS prophylaxis.  Delivery plan: Hopeful for vaginal delivery Type II DM: continue EndoTool for blood sugar management.    Hildred Laser,  MD [facility] at [facility]

## 2022-10-15 NOTE — Progress Notes (Signed)
Intrapartum Progress Note  S: Patient without complaints.   O: Blood pressure 131/76, pulse 87, temperature 97.9 F (36.6 C), temperature source Oral, resp. rate 16, height 5\' 8"  (1.727 m), weight 114.3 kg, last menstrual period 01/29/2022, SpO2 100 %. Gen App: NAD, comfortable Abdomen: soft, gravid FHT: baseline 140 bpm.  Accels present.  Decels present - intermittent early and variable decelerations . Moderate in degree variability.   Tocometer: contractions q 2-3 minutes Cervix: 10/100/0 to +1 Extremities: Nontender, no edema.  Pitocin: 2 mIU  Labs:    Latest Reference Range & Units 10/15/22 14:36 10/15/22 15:39 10/15/22 16:44 10/15/22 17:44 10/15/22 18:37  Glucose-Capillary 70 - 99 mg/dL 88 76 96 161 (H) 99  (H): Data is abnormally high  Assessment:  1: SIUP at [redacted]w[redacted]d 2. Type II DM uncontrolled on insulin 3. Polyhydramnios 4. Fetal macrosomia  Plan:  1. Anticipate vaginal delivery.  2. Continue EndoTool for management of DM.    Hildred Laser, MD 10/15/2022 7:48 PM

## 2022-10-15 NOTE — Anesthesia Procedure Notes (Signed)
Epidural Patient location during procedure: OB Start time: 10/15/2022 12:03 PM End time: 10/15/2022 12:08 PM  Staffing Anesthesiologist: Lenard Simmer, MD Performed: anesthesiologist   Preanesthetic Checklist Completed: patient identified, IV checked, site marked, risks and benefits discussed, surgical consent, monitors and equipment checked, pre-op evaluation and timeout performed  Epidural Patient position: sitting Prep: ChloraPrep Patient monitoring: heart rate, continuous pulse ox and blood pressure Approach: midline Location: L3-L4 Injection technique: LOR saline  Needle:  Needle type: Tuohy  Needle gauge: 17 G Needle length: 9 cm Needle insertion depth: 8 cm Catheter type: closed end flexible Catheter size: 19 Gauge Catheter at skin depth: 13 cm Test dose: negative and 1.5% lidocaine with Epi 1:200 K  Assessment Sensory level: T10 Events: blood not aspirated, no cerebrospinal fluid, injection not painful, no injection resistance, no paresthesia and negative IV test  Additional Notes 1st attempt Pt. Evaluated and documentation done after procedure finished. Patient identified. Risks/Benefits/Options discussed with patient including but not limited to bleeding, infection, nerve damage, paralysis, failed block, incomplete pain control, headache, blood pressure changes, nausea, vomiting, reactions to medication both or allergic, itching and postpartum back pain. Confirmed with bedside nurse the patient's most recent platelet count. Confirmed with patient that they are not currently taking any anticoagulation, have any bleeding history or any family history of bleeding disorders. Patient expressed understanding and wished to proceed. All questions were answered. Sterile technique was used throughout the entire procedure. Please see nursing notes for vital signs. Test dose was given through epidural catheter and negative prior to continuing to dose epidural or start infusion.  Warning signs of high block given to the patient including shortness of breath, tingling/numbness in hands, complete motor block, or any concerning symptoms with instructions to call for help. Patient was given instructions on fall risk and not to get out of bed. All questions and concerns addressed with instructions to call with any issues or inadequate analgesia.    Patient tolerated the insertion well without immediate complications.Reason for block:procedure for pain

## 2022-10-16 ENCOUNTER — Inpatient Hospital Stay: Payer: BC Managed Care – PPO

## 2022-10-16 ENCOUNTER — Encounter: Payer: Self-pay | Admitting: Obstetrics and Gynecology

## 2022-10-16 DIAGNOSIS — O3660X0 Maternal care for excessive fetal growth, unspecified trimester, not applicable or unspecified: Secondary | ICD-10-CM

## 2022-10-16 DIAGNOSIS — M5432 Sciatica, left side: Secondary | ICD-10-CM

## 2022-10-16 HISTORY — DX: Maternal care for excessive fetal growth, unspecified trimester, not applicable or unspecified: O36.60X0

## 2022-10-16 LAB — GLUCOSE, CAPILLARY
Glucose-Capillary: 197 mg/dL — ABNORMAL HIGH (ref 70–99)
Glucose-Capillary: 67 mg/dL — ABNORMAL LOW (ref 70–99)
Glucose-Capillary: 96 mg/dL (ref 70–99)
Glucose-Capillary: 98 mg/dL (ref 70–99)

## 2022-10-16 LAB — CBC
HCT: 31 % — ABNORMAL LOW (ref 36.0–46.0)
Hemoglobin: 10 g/dL — ABNORMAL LOW (ref 12.0–15.0)
MCH: 27 pg (ref 26.0–34.0)
MCHC: 32.3 g/dL (ref 30.0–36.0)
MCV: 83.8 fL (ref 80.0–100.0)
Platelets: 183 10*3/uL (ref 150–400)
RBC: 3.7 MIL/uL — ABNORMAL LOW (ref 3.87–5.11)
RDW: 14.3 % (ref 11.5–15.5)
WBC: 9 10*3/uL (ref 4.0–10.5)
nRBC: 0 % (ref 0.0–0.2)

## 2022-10-16 MED ORDER — METFORMIN HCL 500 MG PO TABS
1000.0000 mg | ORAL_TABLET | Freq: Two times a day (BID) | ORAL | Status: DC
  Administered 2022-10-16 – 2022-10-17 (×3): 1000 mg via ORAL
  Filled 2022-10-16 (×3): qty 2

## 2022-10-16 MED ORDER — GABAPENTIN 100 MG PO CAPS
100.0000 mg | ORAL_CAPSULE | Freq: Two times a day (BID) | ORAL | Status: DC
  Administered 2022-10-16 – 2022-10-17 (×3): 100 mg via ORAL
  Filled 2022-10-16 (×3): qty 1

## 2022-10-16 MED ORDER — INSULIN GLARGINE-YFGN 100 UNIT/ML ~~LOC~~ SOLN
10.0000 [IU] | Freq: Every day | SUBCUTANEOUS | Status: DC
  Administered 2022-10-16 – 2022-10-17 (×2): 10 [IU] via SUBCUTANEOUS
  Filled 2022-10-16 (×2): qty 0.1

## 2022-10-16 NOTE — Progress Notes (Signed)
Quitman Livings CNM notified that Patient does not want surgery this morning due to Sciatic pain with ambulation. I also informed Quitman Livings CNM that I secure chatted this information to Dr. Valentino Saxon earlier this morning. Because Patient is Diabetic, I asked Swanson CNM if she wanted me to call Dr. Valentino Saxon regarding Diet orders. Quitman Livings CNM gave a verbal order for Carb. Modified Diet.

## 2022-10-16 NOTE — Plan of Care (Signed)
Transferred to Room 339. Alert and oriented with pleasant affect. Orientation to Room, Safety and Security, High Fall Risk and POC. Pt. V/O.

## 2022-10-16 NOTE — Anesthesia Post-op Follow-up Note (Addendum)
  Anesthesia Pain Follow-up Note  Patient: Penny Gonzalez  Day #: 1  Date of Follow-up: 10/16/2022 Time: 8:55 AM  Last Vitals:  Vitals:   10/16/22 0301 10/16/22 0838  BP: 115/69 115/83  Pulse: 78 80  Resp: 18 18  Temp: 36.8 C 37.2 C  SpO2: 99% 98%    Level of Consciousness: alert  Pain: severe   Side Effects:None  Catheter Site Exam:clean, dry, no drainage  Patient seen and examined in bed resting. Patient states she is in a lot of pain. Described her pain starting from her back and going into the back of her legs down into her lower legs bilaterally. States that her feet feel fine. Patient is voiding and having bowel movements. Patient has difficulty walking due to the pain and has been shuffling to the bathroom. States that the pain is so bad that she did not want to go through with her tubal ligation this morning. Patient has a history of sciatica during pregnancy but stated that the current pain is worse than her sciatica was while pregnant. Patient's lower back was examined and there was no redness, no drainage, no hematoma. Explained to the patient that most likely her pain comes from her preexisting sciatica and that we should continue to follow it and involve neurology if needed. Patient indicated that she felt like something was wrong and greatly wanted to see neurology for her pain.    Plan: Continue to follow.  Stephanie Coup

## 2022-10-16 NOTE — Progress Notes (Addendum)
Post Partum Day # 1, s/p SVD. Pregnancy complicated by pre-existing diabetes, polyhydramnios, and fetal macrosomia.   Subjective: Patient reports that she is having significant back pain since her epidural that radiates down to her legs. Has a history of sciatica, but notes this pain is worse.  She is voiding and tolerating PO.  Is able to ambulate but slowly. Notes bleeding is normal.  Is working on breastfeeding. Desired BTL this morning however due to her pain and due to OR sterility issues from yesterday's power outages case not able to be performed today.   Objective: Temp:  [97.9 F (36.6 C)-99 F (37.2 C)] 99 F (37.2 C) (05/05 0838) Pulse Rate:  [71-164] 80 (05/05 0838) Resp:  [16-20] 18 (05/05 0838) BP: (102-131)/(61-86) 115/83 (05/05 0838) SpO2:  [98 %-100 %] 98 % (05/05 1610)  Physical Exam:  General: alert and no distress  Lungs: clear to auscultation bilaterally Breasts: normal appearance, no masses or tenderness Heart: regular rate and rhythm, S1, S2 normal, no murmur, click, rub or gallop Abdomen: soft, non-tender; bowel sounds normal; no masses,  no organomegaly Pelvis: Lochia: appropriate, Uterine Fundus: firm Extremities: DVT Evaluation: No evidence of DVT seen on physical exam. Negative Homan's sign. No cords or calf tenderness. No significant calf/ankle edema.    Recent Labs    10/15/22 0037 10/16/22 0646  HGB 11.1* 10.0*  HCT 34.3* 31.0*    Assessment/Plan: - Breastfeeding, Lactation consult - Circumcision as outpatient with Pediatrician - Contraception: desired BTL but postponed currently. If still desired, can consider interval BTL.  - Sciatic pain, will treat with Gabapentin for now, desires consultation with Neurology.  - Patient no longer NPO, placed on Carb-modified diet.  Will resume diabetes medication of Metformin. Also was previously on Rybelsus prior to pregnancy, however not on hospital formulary. Will treat with long-acting basal insulin, and  give SSI meal coverage.  - Continue to monitor. Possible d/c home later today or in a.m.     LOS: 1 day   Hildred Laser, MD [facility] at [facility] 10/16/2022 9:44 AM

## 2022-10-16 NOTE — Discharge Summary (Signed)
Postpartum Discharge Summary  Date of Service updated 10/16/2022     Patient Name: Penny Gonzalez DOB: [birth year] MRN: [id number]  Date of admission: 10/15/2022 Delivery date:10/15/2022  Delivering provider: Hildred Laser  Date of discharge: 10/17/2022  Admitting diagnosis: Pre-existing diabetes mellitus in pregnancy [O24.319] Intrauterine pregnancy: [redacted]w[redacted]d     Secondary diagnosis:  Principal Problem:   Pre-existing diabetes mellitus in pregnancy Active Problems:   Obesity in pregnancy   History of pre-eclampsia in prior pregnancy, currently pregnant   Positive GBS test   Fetal macrosomia affecting management of mother, antepartum  Additional problems: None    Discharge diagnosis: Term Pregnancy Delivered                                              Post partum procedures: None Augmentation: AROM Complications: None  Hospital course: Induction of Labor With Vaginal Delivery   27 y.o. yo [id number] at [redacted]w[redacted]d was admitted to the hospital 10/15/2022 for induction of labor.  Indication for induction: TYPE 2 DM and Fetal macrosomia and polyhydramnios .  Patient had an uncomplicated labor course. Membrane Rupture Time/Date: 2:27 PM ,10/15/2022   Delivery Method:Vaginal, Spontaneous  Episiotomy: None  Lacerations:  None  Details of delivery can be found in separate delivery note.    Patient had a postpartum course complicated by sciatic pain. MRI notes possible sacroiliitis and small disc protrusion at T11-12. She is tolerating regular diet, her pain is controlled with PO medication, she is ambulating and voiding without difficulty. She plans to breastfeed at home.  Patient is discharged home 10/17/22.  Newborn Data: Birth date:10/15/2022  Birth time:8:04 PM  Gender:Female  Living status:Living  Apgars:3 ,7  Weight:4530 g   Magnesium Sulfate received: No BMZ received: No Rhophylac:No MMR:No T-DaP:Given prenatally Flu: No Transfusion:No  Physical exam  Vitals:   10/16/22 2311  10/17/22 0314 10/17/22 0755 10/17/22 1230  BP: 126/75 106/76 127/68 120/81  Pulse: 84 89 82 92  Resp: 20 20 20 18   Temp: 98.7 F (37.1 C) 98.2 F (36.8 C) 98.1 F (36.7 C) 97.7 F (36.5 C)  TempSrc: Oral Oral Oral Oral  SpO2: 99% 99% 99% 100%  Weight:      Height:       General: alert, cooperative, and no distress Lochia: appropriate Uterine Fundus: firm Incision: N/A DVT Evaluation: No evidence of DVT seen on physical exam. Negative Homan's sign. No cords or calf tenderness. No significant calf/ankle edema.   Labs: Lab Results  Component Value Date   WBC 9.0 10/16/2022   HGB 10.0 (L) 10/16/2022   HCT 31.0 (L) 10/16/2022   MCV 83.8 10/16/2022   PLT 183 10/16/2022      Latest Ref Rng & Units 10/15/2022   12:52 AM  CMP  Glucose 70 - 99 mg/dL 147   BUN 6 - 20 mg/dL 9   Creatinine 8.29 - 5.62 mg/dL 1.30   Sodium 865 - 784 mmol/L 133   Potassium 3.5 - 5.1 mmol/L 4.3   Chloride 98 - 111 mmol/L 105   CO2 22 - 32 mmol/L 19   Calcium 8.9 - 10.3 mg/dL 9.0    Edinburgh Score:    10/16/2022    9:30 PM  Edinburgh Postnatal Depression Scale Screening Tool  I have been able to laugh and see the funny side of things. 1  I have looked  forward with enjoyment to things. 1  I have blamed myself unnecessarily when things went wrong. 2  I have been anxious or worried for no good reason. 2  I have felt scared or panicky for no good reason. 1  Things have been getting on top of me. 2  I have been so unhappy that I have had difficulty sleeping. 1  I have felt sad or miserable. 2  I have been so unhappy that I have been crying. 1  The thought of harming myself has occurred to me. 0  Edinburgh Postnatal Depression Scale Total 13      After visit meds:  Allergies as of 10/17/2022       Reactions   Pork-derived Products Other (See Comments)   Not allergy- patient preference    Latex Rash   Pruritic, urticaria        Medication List     STOP taking these medications     Accu-Chek Guide w/Device Kit   aspirin EC 81 MG tablet   ferrous sulfate 325 (65 FE) MG tablet Commonly known as: FerrouSul   insulin aspart 100 UNIT/ML injection Commonly known as: novoLOG   insulin NPH Human 100 UNIT/ML injection Commonly known as: NOVOLIN N   INSULIN SYRINGE .3CC/30GX5/16" 30G X 5/16" 0.3 ML Misc       TAKE these medications    Accu-Chek Guide test strip Generic drug: glucose blood Use 1 strip to check blood sugar 4 times daily   Accu-Chek Softclix Lancets lancets Use to check blood sugar 4 times daily.   blood glucose meter kit and supplies Kit Dispense based on patient and insurance preference. Use daily in the am before breakfast.  For Dx DM type 2, E11.69   gabapentin 100 MG capsule Commonly known as: NEURONTIN Take 1 capsule (100 mg total) by mouth 2 (two) times daily.   ibuprofen 600 MG tablet Commonly known as: ADVIL Take 1 tablet (600 mg total) by mouth every 6 (six) hours as needed.   metFORMIN 500 MG tablet Commonly known as: GLUCOPHAGE Take 1 tablet (500 mg total) by mouth 2 (two) times daily with a meal. Take one tablet by mouth daily for one week. Then increase to one tablet twice a day for one week.  Then two tablets twice a day.   prenatal vitamin w/FE, FA 29-1 MG Chew chewable tablet Chew 2 tablets by mouth daily at 12 noon.   Rybelsus 14 MG Tabs Generic drug: Semaglutide Take 1 tablet (14 mg total) by mouth daily.         Discharge home in stable condition Infant Feeding: Breast Infant Disposition:home with mother Discharge instruction: per After Visit Summary and Postpartum booklet. Activity: Advance as tolerated. Pelvic rest for 6 weeks.  Diet: routine diet Anticipated Birth Control: Plans Interval BTL Postpartum Appointment:6 weeks Additional Postpartum F/U: Postpartum Depression checkup in 2 weeks Future Appointments:No future appointments.  Follow up Visit:  Follow-up Information     Hildred Laser, MD  Follow up.   Specialties: Obstetrics and Gynecology, Radiology Why: 2 week postpartum mood check (televisit).  6 week postpartum visit. BTL planning visit Contact information: [address] [place] [address] [phone]                   10/17/2022 Tresea Mall, CNM

## 2022-10-16 NOTE — Progress Notes (Signed)
Pt. Assisted up to Bathroom with 2 RN's. She is slow to move when moving from lying to sitting position on side of bed. When in standing she grimaces and has an unsteady,slow gait. She denies numbness and/or tingling in legs and she is able to change position in the bed.  She was able to void. Pt. States she has had Sciatica since later part of Pregnancy and that Dr. Valentino Saxon ordered Physical therapy for her but she only went to 2 appointments because, "I knew the baby was causing it." She states a heating pad relieved back pain when in lying position;therefor, a heating pad was provided and Pt. States comfort when in bed. I notified Dr. Karlton Lemon of Pt. C/o the Sciatica feeling worse with ambulation since delivery. Dr. Karlton Lemon stated he would assess Pt. In A.M. Pt. Appears to be sleeping and in NAD at this time.

## 2022-10-16 NOTE — Consult Note (Signed)
NEURO HOSPITALIST CONSULT NOTE   Requesting physician: Dr. Lorette Ang  Reason for Consult: Worsened left sciatic pain  History obtained from:  Patient and Chart     HPI:                                                                                                                                          Penny Gonzalez is an 27 y.o. female with a PMHx of anemia, depression, DM, HTN, joint pain of leg and LLE sciata who is post-partum day 1 after a spontaneous vaginal delivery. She has been having significant back pain radiating down her legs since her epidural. She notes that the pain is worse to her left leg, traversing from her upper buttock down the posterolateral aspect of her thigh and stopping just proximal to her knee. The distribution is the same as her typical sciatic pain, but the intensity is significantly worse. She has been able to ambulate slowly. She has not been experiencing any upper extremity or face/head symptoms. She has not experienced any incontinence.   Anesthesiologist's note from today has been reviewed: "Patient seen and examined in bed resting. Patient states she is in a lot of pain. Described her pain starting from her back and going into the back of her legs down into her lower legs bilaterally. States that her feet feel fine. Patient is voiding and having bowel movements. Patient has difficulty walking due to the pain and has been shuffling to the bathroom. States that the pain is so bad that she did not want to go through with her tubal ligation this morning. Patient has a history of sciatica during pregnancy but stated that the current pain is worse than her sciatica was while pregnant. Patient's lower back was examined and there was no redness, no drainage, no hematoma. Explained to the patient that most likely her pain comes from her preexisting sciatica and that we should continue to follow it and involve neurology if needed. Patient indicated  that she felt like something was wrong and greatly wanted to see neurology for her pain. "  Past Medical History:  Diagnosis Date   Anemia, iron deficiency, inadequate dietary intake 12/01/2014   Depression    patient has not been officially diagnosed but has symptoms   Diabetes mellitus without complication (HCC)    Recently changed to Glyburide and thinks it is helping   Essential hypertension, benign 03/31/2016   Irregular periods/menstrual cycles    Joint pain of leg    Major depression 11/23/2015   Sciatica     Past Surgical History:  Procedure Laterality Date   CHOLECYSTECTOMY N/A 05/19/2016   Procedure: LAPAROSCOPIC CHOLECYSTECTOMY WITH INTRAOPERATIVE CHOLANGIOGRAM;  Surgeon: Nadeen Landau, MD;  Location: [facility] ORS;  Service: General;  Laterality: N/A;  Family History  Problem Relation Age of Onset   Diabetes Mother    Arthritis Father    Diabetes Father    Heart disease Father    Hypertension Father    Stroke Father    Lupus Sister    Deafness Sister    Diabetes Maternal Grandmother    Hypertension Maternal Grandmother    Heart disease Paternal Grandmother    Hypertension Paternal Grandmother             Social History:  reports that she has never smoked. She has never used smokeless tobacco. She reports that she does not currently use alcohol. She reports that she does not use drugs.  Allergies  Allergen Reactions   Pork-Derived Products Other (See Comments)    Not allergy- patient preference    Latex Rash    Pruritic, urticaria    MEDICATIONS:                                                                                                                     Prior to Admission:  Medications Prior to Admission  Medication Sig Dispense Refill Last Dose   aspirin EC 81 MG tablet Take 1 tablet (81 mg total) by mouth daily. Swallow whole. 90 tablet 4 Past Week   ferrous sulfate (FERROUSUL) 325 (65 FE) MG tablet Take 1 tablet (325 mg total) by mouth 2  (two) times daily. 60 tablet 1 Past Week   insulin NPH Human (NOVOLIN N) 100 UNIT/ML injection Inject 0.16 mLs (16 Units total) into the skin daily before breakfast. 10 mL 11 10/14/2022   insulin NPH Human (NOVOLIN N) 100 UNIT/ML injection Inject 0.06 mLs (6 Units total) into the skin daily before supper. 10 mL 11 10/14/2022   prenatal vitamin w/FE, FA (NATACHEW) 29-1 MG CHEW chewable tablet Chew 2 tablets by mouth daily at 12 noon.   Past Week   Accu-Chek Softclix Lancets lancets Use to check blood sugar 4 times daily. 100 each 12    blood glucose meter kit and supplies KIT Dispense based on patient and insurance preference. Use daily in the am before breakfast.  For Dx DM type 2, E11.69 1 each 0    Blood Glucose Monitoring Suppl (ACCU-CHEK GUIDE) w/Device KIT Use glucose meter to check blood sugar 4 times daily. 1 kit 0    glucose blood (ACCU-CHEK GUIDE) test strip Use 1 strip to check blood sugar 4 times daily 100 each 12    insulin aspart (NOVOLOG) 100 UNIT/ML injection Inject 8 Units into the skin daily before breakfast. (Patient taking differently: Inject 10 Units into the skin daily before breakfast.) 10 mL 11    insulin aspart (NOVOLOG) 100 UNIT/ML injection Inject 6 Units into the skin daily before supper. (Patient taking differently: Inject 10 Units into the skin daily before supper.) 10 mL 11    Insulin Syringe-Needle U-100 (INSULIN SYRINGE .3CC/30GX5/16") 30G X 5/16" 0.3 ML MISC 2 Units by Does not apply route 2 (two) times daily. 60 each  3    Scheduled:  famotidine  40 mg Oral Once   gabapentin  100 mg Oral BID   ibuprofen  600 mg Oral Q6H   insulin aspart  0-15 Units Subcutaneous TID WC   insulin aspart  0-5 Units Subcutaneous QHS   insulin aspart  4 Units Subcutaneous TID WC   insulin glargine-yfgn  10 Units Subcutaneous Daily   metFORMIN  1,000 mg Oral BID WC   metoCLOPramide  10 mg Oral Once   prenatal multivitamin  1 tablet Oral Q1200   senna-docusate  2 tablet Oral Daily    Continuous:  lactated ringers       ROS:                                                                                                                                       As per HPI.    Blood pressure 115/83, pulse 80, temperature 99 F (37.2 C), temperature source Oral, resp. rate 18, height 5\' 8"  (1.727 m), weight 114.3 kg, last menstrual period 01/29/2022, SpO2 98 %, unknown if currently breastfeeding.   General Examination:                                                                                                       Physical Exam HEENT- [place]/AT   Lungs- Respirations unlabored Extremities- Warm and well-perfused  Neurological Examination Mental Status: Awake and alert. Fully oriented. Thought content appropriate.  Speech fluent without evidence of aphasia.  Able to follow all commands without difficulty. Cranial Nerves: II: Temporal visual fields intact with no extinction to DSS. PERRL. III,IV, VI: No ptosis. EOMI. No nystagmus. V: Temp sensation equal bilaterally VII: Smile symmetric VIII: Hearing intact to voice IX,X: No hypophonia or hoarseness XI: Symmetric XII: Midline tongue extension Motor: RUE: 5/5 proximally and distally LUE: 5/5 proximally and distally RLE: 4/5 hip flexion and extension; 4+/5 hip abduction, 4-/5 hip adduction; 4+/5 knee extension, 4/5 knee flexion, 4+/5 APF/ADF LLE: 4-/5 hip flexion and extension; 4/5 hip abduction, 4-/5 hip adduction; 4/5 knee extension, 4-/5 knee flexion, 4/5 APF/ADF Sensory: Temp and FT intact x 4 proximally and distally at multiple locations along arms and legs. No extinction to DSS. Deep Tendon Reflexes: 3+ and symmetric bilateral biceps and brachioradialis; 2+ bilateral patellae and achilles Plantars: Right: downgoingLeft: downgoing Cerebellar: No ataxia with FNF bilaterally Gait: Deferred    Lab Results: Basic Metabolic Panel: Recent Labs  Lab 10/15/22 0052  NA 133*  K 4.3  CL 105  CO2 19*  GLUCOSE  255*  BUN 9  CREATININE 0.61  CALCIUM 9.0    CBC: Recent Labs  Lab 10/15/22 0037 10/16/22 0646  WBC 6.4 9.0  HGB 11.1* 10.0*  HCT 34.3* 31.0*  MCV 83.3 83.8  PLT 240 183    Cardiac Enzymes: No results for input(s): "CKTOTAL", "CKMB", "CKMBINDEX", "TROPONINI" in the last 168 hours.  Lipid Panel: No results for input(s): "CHOL", "TRIG", "HDL", "CHOLHDL", "VLDL", "LDLCALC" in the last 168 hours.  Imaging: No results found.   Assessment: 27 year old female with worsened LLE sciatic pain following vaginal delivery of her son yesterday - Exam reveals BLE weakness in the context of pain and discomfort post-delivery. No sensory loss noted. Reflexes are preserved. Babinski's are normal.  - DDx includes incidental worsening of her pre-existing sciatica symptoms versus new radiculopathy - Will need MRI lumbar spine for further assessment   Recommendations: - MRI lumbar spine without contrast (ordered). - PT/OT   Electronically signed: Dr. Caryl Pina 10/16/2022, 10:30 AM

## 2022-10-16 NOTE — Progress Notes (Signed)
Secure Chat sent to Dr. Valentino Saxon that Pt. Does not want Tubal Ligation due to Sciatic Pain with ambulation. Will call Dr. Valentino Saxon at [facility] for diet orders since Pt. Is Diabetic.

## 2022-10-17 LAB — GLUCOSE, CAPILLARY
Glucose-Capillary: 108 mg/dL — ABNORMAL HIGH (ref 70–99)
Glucose-Capillary: 120 mg/dL — ABNORMAL HIGH (ref 70–99)

## 2022-10-17 MED ORDER — RYBELSUS 14 MG PO TABS: 1.0000 | ORAL_TABLET | Freq: Every day | ORAL | 1 refills | Status: DC

## 2022-10-17 MED ORDER — GABAPENTIN 100 MG PO CAPS: 100.0000 mg | ORAL_CAPSULE | Freq: Two times a day (BID) | ORAL | 0 refills | Status: DC

## 2022-10-17 MED ORDER — METFORMIN HCL 500 MG PO TABS: 500.0000 mg | ORAL_TABLET | Freq: Two times a day (BID) | ORAL | 5 refills | Status: DC

## 2022-10-17 MED ORDER — IBUPROFEN 600 MG PO TABS: 600.0000 mg | ORAL_TABLET | Freq: Four times a day (QID) | ORAL | 0 refills | Status: DC | PRN

## 2022-10-17 NOTE — TOC Transition Note (Signed)
Transition of Care [facility]) - CM/SW Discharge Note   Patient Details  Name: Penny Gonzalez MRN: [id number] Date of Birth: 1995-09-28  [facility]) CM/SW Contact:  Margarito Liner, LCSW Phone Number: 10/17/2022, 2:45 PM   Clinical Narrative:  Patient has orders to discharge home today. No further concerns. CSW signing off.   Final next level of care: Home/Self Care Barriers to Discharge: Barriers Resolved   Patient Goals and CMS Choice      Discharge Placement                      Patient and family notified of of transfer: 10/17/22  Discharge Plan and Services Additional resources added to the After Visit Summary for       Post Acute Care Choice: NA                               Social Determinants of Health (SDOH) Interventions SDOH Screenings   Food Insecurity: No Food Insecurity (10/15/2022)  Housing: Low Risk  (10/15/2022)  Transportation Needs: No Transportation Needs (10/15/2022)  Utilities: Not At Risk (10/15/2022)  Alcohol Screen: Low Risk  (04/17/2019)  Depression (PHQ2-9): Low Risk  (09/05/2022)  Recent Concern: Depression (PHQ2-9) - High Risk (07/27/2022)  Financial Resource Strain: Low Risk  (04/28/2022)  Physical Activity: Inactive (04/28/2022)  Social Connections: Moderately Integrated (04/28/2022)  Stress: No Stress Concern Present (04/28/2022)  Tobacco Use: Low Risk  (10/16/2022)     Readmission Risk Interventions     No data to display

## 2022-10-17 NOTE — Lactation Note (Signed)
This note was copied from a baby's chart. Lactation Consultation Note  Patient Name: Penny Gonzalez [id number] Date: 10/17/2022 Age:29 hours Reason for consult: Follow-up assessment;Early term 37-38.6wks;RN request   Maternal Data This is mom's 2nd baby, SVD. Mom with a history of Type ll diabetes in pregnancy on insulin(uncontrolled),sciatica, obesity. Mom's plan is to combination feed breast and bottle.   On follow-up today mom reports she is primarily giving the bottle of formula as baby doesn't settle with breastfeeding/breastfeeding attempts. Baby per mom is taking 20-30 ml's per bottle feed. Mom plans on pumping when she goes home.  Has patient been taught Hand Expression?: Yes Does the patient have breastfeeding experience prior to this delivery?: No (Per mom her 1st baby would not latch)  Feeding Mother's Current Feeding Choice: Breast Milk and Formula Nipple Type: Slow - flow   Interventions Interventions: Education Reviewed with mom how the body knows to make milk and provided tips and strategies to maximize milk production and breastfeeding. Offered mom breastfeeding assistance. Mom is undecided if she would like assistance with breastfeeding or to initiate pumping . Will follow-up at 1 pm at approximate feeding time. Mom has LC phone number to call if she would like assistance sooner.  Discharge Discharge Education: Engorgement and breast care;Warning signs for feeding baby;Outpatient recommendation Pump: Personal  Consult Status Consult Status: PRN Date: 10/17/22 Follow-up type: In-patient  Update provided to care nurse.  Fuller Song 10/17/2022, 11:15 AM

## 2022-10-17 NOTE — TOC Initial Note (Signed)
Transition of Care [facility]) - Initial/Assessment Note    Patient Details  Name: Penny Gonzalez MRN: [id number] Date of Birth: 1996-01-10  Transition of Care [facility]) CM/SW Contact:    Margarito Liner, LCSW Phone Number: 10/17/2022, 1:54 PM  Clinical Narrative:    CSW called patient in the room, introduced role, and inquired about resource needs due to [place] score. 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 She gets 3 free sessions and then they are going to refer her to someone for long-term counseling. Patient confirmed she had adequate support at home. Patient asked for assistance with getting her son, Penny Gonzalez, on [place] and getting him set up with [facility]. Financial counselor submitted application to [employer] and said it should be active within 24-48 hours. She called patient to let her know. CSW spoke with a representative with the [facility] program at [facility]. She said patient just has to walk in 8:30-3:30 with the baby. She needs to bring her license/ID, proof of address, Medicaid card, and copy of the baby's birth certificate. No further concerns.             Expected Discharge Plan: Home/Self Care Barriers to Discharge: Continued Medical Work up   Patient Goals and CMS Choice            Expected Discharge Plan and Services     Post Acute Care Choice: NA Living arrangements for the past 2 months: Single Family Home                                      Prior Living Arrangements/Services Living arrangements for the past 2 months: Single Family Home   Patient language and need for interpreter reviewed:: Yes Do you feel safe going back to the place where you live?: Yes      Need for Family Participation in Patient Care: Yes (Comment)     Criminal Activity/Legal Involvement Pertinent to Current Situation/Hospitalization: No - Comment as needed  Activities of Daily Living Home Assistive Devices/Equipment: Contact lenses, Eyeglasses ADL Screening (condition at  time of admission) Patient's cognitive ability adequate to safely complete daily activities?: Yes Is the patient deaf or have difficulty hearing?: No Does the patient have difficulty seeing, even when wearing glasses/contacts?: No Does the patient have difficulty concentrating, remembering, or making decisions?: No Patient able to express need for assistance with ADLs?: Yes Does the patient have difficulty dressing or bathing?: No Independently performs ADLs?: Yes (appropriate for developmental age) Does the patient have difficulty walking or climbing stairs?: No Weakness of Legs: None Weakness of Arms/Hands: None  Permission Sought/Granted                  Emotional Assessment Appearance:: Appears stated age Attitude/Demeanor/Rapport: Engaged, Gracious Affect (typically observed): Accepting, Appropriate, Calm, Pleasant Orientation: : Oriented to Self, Oriented to Place, Oriented to  Time, Oriented to Situation Alcohol / Substance Use: Not Applicable Psych Involvement: No (comment)  Admission diagnosis:  Pre-existing diabetes mellitus in pregnancy [O24.319] Patient Active Problem List   Diagnosis Date Noted   Fetal macrosomia affecting management of mother, antepartum 10/16/2022   Pre-existing diabetes mellitus in pregnancy 10/15/2022   [redacted] weeks gestation of pregnancy 10/10/2022   Non-reactive NST (non-stress test) 10/10/2022   Positive GBS test 10/06/2022   Pre-existing essential hypertension during pregnancy in third trimester 09/30/2022   Diabetes mellitus without complication (HCC) 08/17/2022   Preexisting diabetes  complicating pregnancy, antepartum 07/27/2022   Unwanted fertility 07/27/2022   History of preterm delivery, currently pregnant 05/11/2022   History of pre-eclampsia in prior pregnancy, currently pregnant 05/11/2022   Recurrent major depressive disorder, in partial remission (HCC) 12/12/2018   Obesity in pregnancy 11/29/2017   Acanthosis nigricans 06/27/2017    Fatty liver 05/07/2016   Essential hypertension, benign 03/31/2016   Proteinuria 01/30/2016   Vitamin D deficiency 11/19/2015   Pre-existing diabetes mellitus during pregnancy in third trimester 06/24/2014   PCP:  [facility] Pharm[facility]:   [id number] - [place], [place] - [address] [place] [address] Phone: [phone] Fax: [phone]  [facility] [place] - [address] AT [facility] [address] Mail Order pharmacy [place] [address] Phone: [phone] Fax: [phone]  [facility] [phone] - [place], [place] [address] [place] [address] Phone: [phone] Fax: [phone]  [id number] [place], [place] - [address] AT [facility] [address] [place] [address] Phone: [phone] Fax: [phone]     Social Determinants of Health (SDOH) Social History: SDOH Screenings   Food Insecurity: No Food Insecurity (10/15/2022)  Housing: Low Risk  (10/15/2022)  Transportation Needs: No Transportation Needs (10/15/2022)  Utilities: Not At Risk (10/15/2022)  Alcohol Screen: Low Risk  (04/17/2019)  Depression (PHQ2-9): Low Risk  (09/05/2022)  Recent Concern: Depression (PHQ2-9) - High Risk (07/27/2022)  Financial Resource Strain: Low Risk  (04/28/2022)  Physical Activity: Inactive (04/28/2022)  Social Connections: Moderately Integrated (04/28/2022)  Stress: No Stress Concern Present (04/28/2022)  Tobacco Use: Low Risk  (10/16/2022)   SDOH Interventions:     Readmission Risk Interventions     No data to display

## 2022-10-17 NOTE — Inpatient Diabetes Management (Addendum)
Inpatient Diabetes Program Recommendations  AACE/ADA: New Consensus Statement on Inpatient Glycemic Control   Target Ranges:  Prepandial:   less than 140 mg/dL      Peak postprandial:   less than 180 mg/dL (1-2 hours)      Critically ill patients:  140 - 180 mg/dL    Latest Reference Range & Units 10/16/22 08:42 10/16/22 11:03 10/16/22 15:30 10/16/22 18:46 10/16/22 22:01  Glucose-Capillary 70 - 99 mg/dL 98   Novolog 4 units  Semglee 10 units  Metformin 1000 mg 67 (L)         Metformin 1000 mg 96    Latest Reference Range & Units 10/15/22 13:14 10/15/22 14:36 10/15/22 15:39 10/15/22 16:44 10/15/22 17:44 10/15/22 18:37 10/15/22 19:27 10/15/22 21:10 10/15/22 23:59  Glucose-Capillary 70 - 99 mg/dL 742 (H) 88 76 96 595 (H) 99 98   IV insulin stopped 197 (H)    Latest Reference Range & Units 08/19/22 12:09  Hemoglobin A1C 4.8 - 5.6 % 6.8 (H)   Review of Glycemic Control  Diabetes history: DM2 Outpatient Diabetes medications: NPH 16 units QAM, NPH 6 units QPM, Novolog 10 units QAM, Novolog 6 units QPM; prior to pregnancy Metformin 500 mg daily, Rybelsus 7 mg daily  Current orders for Inpatient glycemic control: Semglee 10 units daily, Novolog 0-15 units TID with meals, Novolog 0-5 units QHS, Novolog 4 units TID with meals, Metformin 1000 mg BID  Inpatient Diabetes Program Recommendations:    Insulin: Please consider discontinuing Semglee 10 units daily and Novolog 4 units TID with meals.   NOTE: Patient admitted on 10/15/22 for IOL ([redacted]W[redacted]D). Patient taking NPH and Novolog insulin regimen for DM control prior to admission. In reviewing chart, noted patient had only been on Metformin 500 mg daily for DM2 prior to pregnancy. IV insulin stopped on 10/15/22 around 21:10. Patient received Semglee 10 units, Novolog 4 units, and Metformin 1000 mg at 11:04 am on 10/16/22. CBG down to 67 mg/dl at 63:87 on 10/16/41 and fasting CBG 108 mg/dl this morning. Recommend to discontinue Semglee and meal  coverage insulin and continue Metformin and Novolog correction scale.   Thanks, Orlando Penner, RN, MSN, CDCES Diabetes Coordinator Inpatient Diabetes Program [phone] (Team Pager from 8am to 5pm)

## 2022-10-17 NOTE — Discharge Instructions (Signed)
Discharge Instructions:   If there are any new medications, they have been ordered and will be available for pickup at the listed pharmacy on your way home from the hospital.   Call office if you have any of the following: headache, visual changes, fever >101.0 F, chills, shortness of breath, breast concerns, excessive vaginal bleeding, incision drainage or problems, leg pain or redness, depression or any other concerns. If you have vaginal discharge with an odor, let your doctor know.   It is normal to bleed for up to 6 weeks. You should not soak through more than 1 pad in 1 hour. If you have a blood clot larger than your fist with continued bleeding, call your doctor.   Activity: Do not lift > 10 lbs for 6 weeks (do not lift anything heavier than your baby). No intercourse, tampons, swimming pools, hot tubs, baths (only showers) for 6 weeks.  No driving for 1-2 weeks. Continue prenatal vitamin, especially if breastfeeding. Increase calories and fluids (water) while breastfeeding.   Your milk will come in, in the next couple of days (right now it is colostrum). You may have a slight fever when your milk comes in, but it should go away on its own.  If it does not, and rises above 101 F please call the doctor. You will also feel achy and your breasts will be firm. They will also start to leak. If you are breastfeeding, continue as you have been and you can pump/express milk for comfort.   If you have too much milk, your breasts can become engorged, which could lead to mastitis. This is an infection of the milk ducts. It can be very painful and you will need to notify your doctor to obtain a prescription for antibiotics. You can also treat it with a shower or hot/cold compress.   For concerns about your baby, please call your pediatrician.  For breastfeeding concerns, the lactation consultant can be reached at [phone].   Postpartum blues (feelings of happy one minute and sad another minute)  are normal for the first few weeks but if it gets worse let your doctor know.   Congratulations! We enjoyed caring for you and your new bundle of joy!  

## 2022-10-17 NOTE — Anesthesia Postprocedure Evaluation (Signed)
Anesthesia Post Note  Patient: Penny Gonzalez  Procedure(s) Performed: MR LUMBAR SPINE WO CONTRAST  Patient location during evaluation: Mother Baby Anesthesia Type: Epidural Pain management: pain level controlled Vital Signs Assessment: post-procedure vital signs reviewed and stable Respiratory status: respiratory function stable Cardiovascular status: stable Postop Assessment: no headache, no backache, patient able to bend at knees, no apparent nausea or vomiting, able to ambulate and adequate PO intake Anesthetic complications: no   No notable events documented.   Last Vitals:  Vitals:   10/16/22 2311 10/17/22 0314  BP: 126/75 106/76  Pulse: 84 89  Resp: 20 20  Temp: 37.1 C 36.8 C  SpO2: 99% 99%    Last Pain:  Vitals:   10/17/22 0317  TempSrc:   PainSc: 0-No pain                 Zachary George

## 2022-10-17 NOTE — Progress Notes (Signed)
Patient discharged home with infant. Discharge instructions and prescriptions given and reviewed with patient. Patient verbalized understanding. Escorted out by staff.  

## 2022-10-17 NOTE — Lactation Note (Signed)
This note was copied from a baby's chart. Lactation Consultation Note  Patient Name: Penny Gonzalez [id number] Date: 10/17/2022 Age:29 hours     Followed up with mom at 1:15 pm. Mom declined LC assistance at this time. Mom aware she has the LC number on white board and can call if she needs LC assistance.   Update provided to care nurse.   Fuller Song 10/17/2022, 4:24 PM

## 2022-10-18 ENCOUNTER — Inpatient Hospital Stay (HOSPITAL_COMMUNITY)
Admission: AD | Admit: 2022-10-18 | Discharge: 2022-10-18 | Disposition: A | Discharge status: Home / Self Care | Payer: BC Managed Care – PPO | Attending: Obstetrics and Gynecology | Admitting: Obstetrics and Gynecology

## 2022-10-18 ENCOUNTER — Encounter (HOSPITAL_COMMUNITY): Payer: Self-pay | Admitting: Obstetrics and Gynecology

## 2022-10-18 ENCOUNTER — Telehealth: Payer: Self-pay

## 2022-10-18 DIAGNOSIS — O99893 Other specified diseases and conditions complicating puerperium: Secondary | ICD-10-CM | POA: Diagnosis present

## 2022-10-18 DIAGNOSIS — R200 Anesthesia of skin: Secondary | ICD-10-CM | POA: Insufficient documentation

## 2022-10-18 DIAGNOSIS — O1205 Gestational edema, complicating the puerperium: Secondary | ICD-10-CM | POA: Diagnosis not present

## 2022-10-18 MED ORDER — ACETAMINOPHEN 325 MG PO TABS
650.0000 mg | ORAL_TABLET | Freq: Once | ORAL | Status: AC
  Administered 2022-10-18: 650 mg via ORAL
  Filled 2022-10-18: qty 2

## 2022-10-18 MED ORDER — FUROSEMIDE 20 MG PO TABS: 20.0000 mg | ORAL_TABLET | Freq: Every day | ORAL | 0 refills | Status: DC

## 2022-10-18 NOTE — MAU Note (Signed)
.  Penny Gonzalez is a 27 y.o. at Unknown here in MAU reporting: PP vag delivery on 10/15/22. Stated she has increased swelling in feet and legs. Tried elevating them for a while and laid down to rest. When she woke up  her left leg felt numb. Still having some numbness in her left leg in certain spots. Stated she does have sciatic  pain  in her leg normally as well . Has taken ibuprofen for discomfort.  Onset of complaint: today Pain score: 9 Vitals:   10/18/22 2211  BP: (!) 113/53  Pulse: 86      Lab orders placed from triage:

## 2022-10-18 NOTE — Telephone Encounter (Signed)
Pt states she had the baby 3 days ago and both legs are swollen really bad. Pt states she has not been on her feet and is currently elevating them. She reports feeling fine otherwise no head aches or blurred vision. What should she do? Please advise

## 2022-10-18 NOTE — MAU Provider Note (Signed)
Chief Complaint:  Leg Swelling and Numbness   Event Date/Time   First Provider Initiated Contact with Patient 10/18/22 2230      HPI: Penny Gonzalez is a 27 y.o. [id number] who is 3 days post vaginal delivery at [facility] presents to maternity admissions reporting Swelling of legs.  States has had some intermittent numbness of left leg and temporary numbness of left side of face which has resolved   Has a history of sciatica with numbness at times.   Just feels uncomfortable, this is more swelling than she had with her first pregnancy. . She reports vaginal bleeding, no urinary symptoms, h/a, n/v, or fever/chills.    Other This is a recurrent problem. The current episode started in the past 7 days. The problem occurs constantly. The problem has been unchanged. Associated symptoms include fatigue. Pertinent negatives include no abdominal pain, chills, headaches, myalgias, nausea, urinary symptoms or visual change. Nothing aggravates the symptoms. Treatments tried: elevating feet. The treatment provided no relief.   RN Note: Penny Gonzalez is a 27 y.o. at Unknown here in MAU reporting: PP vag delivery on 10/15/22. Stated she has increased swelling in feet and legs. Tried elevating them for a while and laid down to rest. When she woke up  her left leg felt numb. Still having some numbness in her left leg in certain spots. Stated she does have sciatic  pain  in her leg normally as well . Has taken ibuprofen for discomfort.  Onset of complaint: today Pain score: 9  Past Medical History: Past Medical History:  Diagnosis Date   Anemia, iron deficiency, inadequate dietary intake 12/01/2014   Depression    patient has not been officially diagnosed but has symptoms   Diabetes mellitus without complication (HCC)    Recently changed to Glyburide and thinks it is helping   Essential hypertension, benign 03/31/2016   Irregular periods/menstrual cycles    Joint pain of leg    Major  depression 11/23/2015   Sciatica     Past obstetric history: OB History  Gravida Para Term Preterm AB Living  4 2 1 1 2 2   SAB IAB Ectopic Multiple Live Births    2   0 2    # Outcome Date GA Lbr Len/2nd Weight Sex Delivery Anes PTL Lv  4 Term 10/15/22 [redacted]w[redacted]d  4530 g M Vag-Spont EPI  LIV  3 IAB 07/31/21     TAB     2 IAB 01/04/20     TAB     1 Preterm 08/06/14 [redacted]w[redacted]d  4054 g F Vag-Spont  Y LIV     Complications: Pre-eclampsia    Past Surgical History: Past Surgical History:  Procedure Laterality Date   CHOLECYSTECTOMY N/A 05/19/2016   Procedure: LAPAROSCOPIC CHOLECYSTECTOMY WITH INTRAOPERATIVE CHOLANGIOGRAM;  Surgeon: Nadeen Landau, MD;  Location: [facility] ORS;  Service: General;  Laterality: N/A;    Family History: Family History  Problem Relation Age of Onset   Diabetes Mother    Arthritis Father    Diabetes Father    Heart disease Father    Hypertension Father    Stroke Father    Lupus Sister    Deafness Sister    Diabetes Maternal Grandmother    Hypertension Maternal Grandmother    Heart disease Paternal Grandmother    Hypertension Paternal Grandmother     Social History: Social History   Tobacco Use   Smoking status: Never   Smokeless tobacco: Never  Vaping Use  Vaping Use: Never used  Substance Use Topics   Alcohol use: Not Currently    Comment: OCC   Drug use: No    Allergies:  Allergies  Allergen Reactions   Latex Rash    Pruritic, urticaria    Meds:  Medications Prior to Admission  Medication Sig Dispense Refill Last Dose   gabapentin (NEURONTIN) 100 MG capsule Take 1 capsule (100 mg total) by mouth 2 (two) times daily. 60 capsule 0 10/18/2022   ibuprofen (ADVIL) 600 MG tablet Take 1 tablet (600 mg total) by mouth every 6 (six) hours as needed. 30 tablet 0 10/18/2022   metFORMIN (GLUCOPHAGE) 500 MG tablet Take 1 tablet (500 mg total) by mouth 2 (two) times daily with a meal. Take one tablet by mouth daily for one week. Then increase to one  tablet twice a day for one week.  Then two tablets twice a day. 60 tablet 5 10/18/2022   prenatal vitamin w/FE, FA (NATACHEW) 29-1 MG CHEW chewable tablet Chew 2 tablets by mouth daily at 12 noon.   10/18/2022   Semaglutide (RYBELSUS) 14 MG TABS Take 1 tablet (14 mg total) by mouth daily. 90 tablet 1 10/18/2022   Accu-Chek Softclix Lancets lancets Use to check blood sugar 4 times daily. 100 each 12    blood glucose meter kit and supplies KIT Dispense based on patient and insurance preference. Use daily in the am before breakfast.  For Dx DM type 2, E11.69 1 each 0    glucose blood (ACCU-CHEK GUIDE) test strip Use 1 strip to check blood sugar 4 times daily 100 each 12     I have reviewed patient's Past Medical Hx, Surgical Hx, Family Hx, Social Hx, medications and allergies.  ROS:  Review of Systems  Constitutional:  Positive for fatigue. Negative for chills.  Gastrointestinal:  Negative for abdominal pain and nausea.  Musculoskeletal:  Negative for myalgias.  Neurological:  Negative for headaches.   Other systems negative     Physical Exam  Patient Vitals for the past 24 hrs:  BP Temp Pulse Resp  10/18/22 2211 (!) 113/53 -- 86 --  10/18/22 2210 -- 98.1 F (36.7 C) -- 18   Constitutional: Well-developed, well-nourished female in no acute distress.  Cardiovascular: normal rate and rhythm, no ectopy audible, S1 & S2 heard, no murmur Respiratory: normal effort, no distress. Lungs CTAB with no wheezes or crackles GI: Abd soft, non-tender.  Nondistended.  No rebound, No guarding.   MS: Extremities nontender, 1+ edema, normal ROM Neurologic: Alert and oriented x 4.   Grossly nonfocal. GU: Neg CVAT. Skin:  Warm and Dry Psych:  Affect appropriate.   Labs: No results found for this or any previous visit (from the past 24 hour(s)). --/--/O POS (05/04 1610)  Imaging:    MAU Course/MDM: I have reviewed the triage vital signs and the nursing notes.   Pertinent labs & imaging results that  were available during my care of the patient were reviewed by me and considered in my medical decision making (see chart for details).      I have reviewed her medical records including past results, notes and treatments.   I have ordered labs as follows: None Imaging ordered: none .   Discussed normal postpartum edema Reviewed that in light of normal blood pressure this is not worrisome and will likely resolve on its own in a few days.  But will offer a short course of Lasix for relief of discomfort.  Pt stable at time of discharge.  Assessment: Postpartum Day #3 Postpartum edema  Plan: Discharge home Recommend elevate legs Rx sent for Lasix daily for 3 days for edema  Encouraged to return here or to other Urgent Care/ED if she develops worsening of symptoms, increase in pain, fever, or other concerning symptoms.   Wynelle Bourgeois CNM, MSN Certified Nurse-Midwife 10/18/2022 10:31 PM

## 2022-10-19 ENCOUNTER — Encounter: Payer: BC Managed Care – PPO | Admitting: Physical Therapy

## 2022-10-24 NOTE — Telephone Encounter (Signed)
Called pt no answer, will try again later today.

## 2022-10-25 NOTE — Telephone Encounter (Signed)
Called pt back today no answer left voice message to advise her of Penny Gonzalez advice.

## 2022-10-26 ENCOUNTER — Encounter: Payer: BC Managed Care – PPO | Admitting: Physical Therapy

## 2022-10-28 NOTE — Patient Instructions (Signed)

## 2022-10-28 NOTE — Progress Notes (Unsigned)
   Virtual Visit via Video Note  I connected with NAME@ on 10/28/22 at  11:15 AM EDT by a video enabled telemedicine application and verified that I am speaking with the correct person using two identifiers.  Location: Patient: *** Provider: ***   I discussed the limitations of evaluation and management by telemedicine and the availability of in person appointments. The patient expressed understanding and agreed to proceed.    History of Present Illness:   Penny Gonzalez is a 27 y.o. [phone] female who presents for a 2 week televisit for mood check. She is 2 weeks postpartum following a spontaneous vaginal.  The delivery was at 37.0 gestational weeks.  Postpartum course has been well so far. Baby is feeding by ***. Bleeding: ***. Postpartum depression screening: {Desc; negative/positive:13464}.  EDPS score is ***.      The following portions of the patient's history were reviewed and updated as appropriate: allergies, current medications, past family history, past medical history, past social history, past surgical history, and problem list.   Observations/Objective:   unknown if currently breastfeeding. Gen App: NAD Psych: normal speech, affect. Good mood.        10/16/2022    9:30 PM 09/14/2022    3:05 PM 04/28/2022    3:32 PM  Edinburgh Postnatal Depression Scale Screening Tool  I have been able to laugh and see the funny side of things. 1 1 1   I have looked forward with enjoyment to things. 1 1 0  I have blamed myself unnecessarily when things went wrong. 2 1 2   I have been anxious or worried for no good reason. 2 2 0  I have felt scared or panicky for no good reason. 1 2 0  Things have been getting on top of me. 2 2 2   I have been so unhappy that I have had difficulty sleeping. 1 3 0  I have felt sad or miserable. 2 3 2   I have been so unhappy that I have been crying. 1 3 1   The thought of harming myself has occurred to me. 0 0 0  Edinburgh Postnatal Depression Scale  Total 13 18 8          Assessment and Plan:   1. Encounter for screening for maternal depression - Screening {FINDINGS; POSITIVE [phone]} today. Will rescreen at 6 week postpartum visit. Overall doing well.    2. Postpartum state - Overall doing well. Continue routine postpartum home care.    3. Lactating mother -    Follow Up Instructions:     I discussed the assessment and treatment plan with the patient. The patient was provided an opportunity to ask questions and all were answered. The patient agreed with the plan and demonstrated an understanding of the instructions.   The patient was advised to call back or seek an in-person evaluation if the symptoms worsen or if the condition fails to improve as anticipated.   I provided *** minutes of non-face-to-face time during this encounter.     Hildred Laser, MD [facility]

## 2022-11-01 ENCOUNTER — Telehealth (INDEPENDENT_AMBULATORY_CARE_PROVIDER_SITE_OTHER): Payer: BC Managed Care – PPO | Admitting: Obstetrics and Gynecology

## 2022-11-01 ENCOUNTER — Encounter: Payer: Self-pay | Admitting: Obstetrics and Gynecology

## 2022-11-01 DIAGNOSIS — Z1332 Encounter for screening for maternal depression: Secondary | ICD-10-CM | POA: Diagnosis not present

## 2022-11-01 DIAGNOSIS — I10 Essential (primary) hypertension: Secondary | ICD-10-CM

## 2022-11-01 DIAGNOSIS — E119 Type 2 diabetes mellitus without complications: Secondary | ICD-10-CM

## 2022-11-08 ENCOUNTER — Encounter: Payer: Self-pay | Admitting: Obstetrics and Gynecology

## 2022-11-08 MED ORDER — METRONIDAZOLE 500 MG PO TABS: 500.0000 mg | ORAL_TABLET | Freq: Two times a day (BID) | ORAL | 0 refills | Status: DC

## 2022-11-14 NOTE — Progress Notes (Unsigned)
OBSTETRICS POSTPARTUM CLINIC PROGRESS NOTE  Subjective:     Penny Gonzalez is a 27 y.o. [phone] female who presents for a postpartum visit. She is 6 weeks postpartum following a spontaneous vaginal delivery. I have fully reviewed the prenatal and intrapartum course. The delivery was at 37.0 gestational weeks.  Pregnancy was complicated by uncontrolled Type II DM and feta macrosomia, and HTN (no meds). Anesthesia: epidural. Postpartum course has been well, her left hip is bothering her. Feels like it will give out.  Baby's course has been good, has some bad acid reflux. Baby is feeding by bottle - Similac Total Comfort . Bleeding: patient has not not resumed menses, with No LMP recorded.. Bowel function is normal. Bladder function is normal. Patient is not sexually active. Contraception method desired is oral progesterone-only contraceptive for now, is considering IUD for LARC. Postpartum depression screening: positive.  EDPS score is 11.    The following portions of the patient's history were reviewed and updated as appropriate: allergies, current medications, past family history, past medical history, past social history, past surgical history, and problem list.  Review of Systems Pertinent items are noted in HPI.   Objective:    BP (!) 137/98   Pulse 87   Resp 16   Ht 5\' 7"  (1.702 m)   Wt 217 lb 11.2 oz (98.7 kg)   BMI 34.10 kg/m   General:  alert and no distress   Breasts:  inspection negative, no nipple discharge or bleeding, no masses or nodularity palpable  Lungs: clear to auscultation bilaterally  Heart:  regular rate and rhythm, S1, S2 normal, no murmur, click, rub or gallop  Abdomen: soft, non-tender; bowel sounds normal; no masses,  no organomegaly.     Vulva:  normal  Vagina: normal vagina, no discharge, exudate, lesion, or erythema  Cervix:  no cervical motion tenderness and no lesions  Corpus: normal size, contour, position, consistency, mobility, non-tender   Adnexa:  normal adnexa and no mass, fullness, tenderness  Rectal Exam: Not performed.             11/16/2022   10:36 AM 11/01/2022   11:39 AM 10/16/2022    9:30 PM 09/14/2022    3:05 PM 04/28/2022    3:32 PM  Edinburgh Postnatal Depression Scale Screening Tool  I have been able to laugh and see the funny side of things. 0 1 1 1 1   I have looked forward with enjoyment to things. 1 1 1 1  0  I have blamed myself unnecessarily when things went wrong. 1 1 2 1 2   I have been anxious or worried for no good reason. 2 2 2 2  0  I have felt scared or panicky for no good reason. 1 3 1 2  0  Things have been getting on top of me. 2 2 2 2 2   I have been so unhappy that I have had difficulty sleeping. 1 1 1 3  0  I have felt sad or miserable. 1 2 2 3 2   I have been so unhappy that I have been crying. 1 1 1 3 1   The thought of harming myself has occurred to me. 1 0 0 0 0  Edinburgh Postnatal Depression Scale Total 11 14 13 18 8      Labs:  Lab Results  Component Value Date   HGB 10.0 (L) 10/16/2022     Assessment:   1. Postpartum care following vaginal delivery   2. Cervical cancer screening   3.  At risk for depressed mood during postpartum period   4. Essential hypertension, benign   5. Diabetes mellitus without complication (HCC)      Plan:   1. Contraception: oral progesterone-only contraceptive for now. Patient to f/u once IUD placement desired.  2. Elevated mood screen again today, however has decreased since 2 weeks. Patient overall notes "feeling fine", will continue to monitor.  3. H/o HTN, no meds.  BPs borderline elevated today. Will continue to monitor.  4.  Type II DM, has resumed Rybelsus. Advised to f/u with PCP regarding further management.  5.  Pap smear performed today for routine cervica screening.  6. Follow up in:  3-4  months or as needed.    Hildred Laser, MD [facility]

## 2022-11-16 ENCOUNTER — Other Ambulatory Visit (HOSPITAL_COMMUNITY)
Admission: RE | Admit: 2022-11-16 | Discharge: 2022-11-16 | Disposition: A | Discharge status: Home / Self Care | Payer: BC Managed Care – PPO | Source: Ambulatory Visit | Attending: Obstetrics and Gynecology | Admitting: Obstetrics and Gynecology

## 2022-11-16 ENCOUNTER — Ambulatory Visit (INDEPENDENT_AMBULATORY_CARE_PROVIDER_SITE_OTHER): Payer: BC Managed Care – PPO | Admitting: Obstetrics and Gynecology

## 2022-11-16 ENCOUNTER — Encounter: Payer: Self-pay | Admitting: Obstetrics and Gynecology

## 2022-11-16 DIAGNOSIS — Z124 Encounter for screening for malignant neoplasm of cervix: Secondary | ICD-10-CM | POA: Diagnosis not present

## 2022-11-16 DIAGNOSIS — E119 Type 2 diabetes mellitus without complications: Secondary | ICD-10-CM

## 2022-11-16 DIAGNOSIS — I10 Essential (primary) hypertension: Secondary | ICD-10-CM

## 2022-11-16 DIAGNOSIS — Z9189 Other specified personal risk factors, not elsewhere classified: Secondary | ICD-10-CM

## 2022-11-16 DIAGNOSIS — Z30011 Encounter for initial prescription of contraceptive pills: Secondary | ICD-10-CM

## 2022-11-16 MED ORDER — SLYND 4 MG PO TABS: 1.0000 | ORAL_TABLET | Freq: Every day | ORAL | 3 refills | Status: DC

## 2022-11-16 NOTE — Patient Instructions (Signed)

## 2022-11-21 LAB — CYTOLOGY - PAP
Adequacy: ABSENT
Diagnosis: NEGATIVE

## 2022-11-23 ENCOUNTER — Encounter (HOSPITAL_COMMUNITY): Payer: Self-pay

## 2022-11-23 ENCOUNTER — Encounter (HOSPITAL_COMMUNITY): Payer: Self-pay | Admitting: Clinical

## 2022-11-23 ENCOUNTER — Ambulatory Visit (INDEPENDENT_AMBULATORY_CARE_PROVIDER_SITE_OTHER): Payer: BC Managed Care – PPO | Admitting: Clinical

## 2022-11-23 DIAGNOSIS — F419 Anxiety disorder, unspecified: Secondary | ICD-10-CM | POA: Diagnosis not present

## 2022-11-23 DIAGNOSIS — F53 Postpartum depression: Secondary | ICD-10-CM
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
## 2022-11-24 ENCOUNTER — Telehealth: Payer: Self-pay

## 2022-11-24 NOTE — Telephone Encounter (Signed)
Pt calling; has questions about her bcp.  [phone]  Pt needed reassurance in how to take the pills; adv to take all but the four pills at the end first then take the last four that are diff then start another pack.  Pt asked if it was alright for her to go swimming.  Adv it was and reminded pt not to stay in a wet bathing suit very long.

## 2022-12-13 ENCOUNTER — Encounter: Payer: Self-pay | Admitting: Obstetrics and Gynecology

## 2022-12-20 NOTE — Telephone Encounter (Signed)
Pt calling; sent a msg this am about a form she needs filled out by Friday; was told we could give her a doctor's note on our letterhead stating she can return back to work with no restrictions this Fri the 12th.  She would like to p/u before Fri.  [phone]

## 2022-12-22 ENCOUNTER — Ambulatory Visit (INDEPENDENT_AMBULATORY_CARE_PROVIDER_SITE_OTHER): Payer: BC Managed Care – PPO | Admitting: Family

## 2022-12-22 ENCOUNTER — Encounter: Payer: Self-pay | Admitting: Family

## 2022-12-22 VITALS — BP 145/80 | HR 80 | Ht 68.0 in | Wt 210.0 lb

## 2022-12-22 DIAGNOSIS — E559 Vitamin D deficiency, unspecified: Secondary | ICD-10-CM

## 2022-12-22 DIAGNOSIS — E538 Deficiency of other specified B group vitamins: Secondary | ICD-10-CM | POA: Diagnosis not present

## 2022-12-22 DIAGNOSIS — Z6831 Body mass index (BMI) 31.0-31.9, adult: Secondary | ICD-10-CM

## 2022-12-22 DIAGNOSIS — N760 Acute vaginitis: Secondary | ICD-10-CM

## 2022-12-22 DIAGNOSIS — E1165 Type 2 diabetes mellitus with hyperglycemia: Secondary | ICD-10-CM

## 2022-12-22 DIAGNOSIS — Z202 Contact with and (suspected) exposure to infections with a predominantly sexual mode of transmission: Secondary | ICD-10-CM

## 2022-12-22 DIAGNOSIS — L659 Nonscarring hair loss, unspecified: Secondary | ICD-10-CM

## 2022-12-22 DIAGNOSIS — R300 Dysuria: Secondary | ICD-10-CM | POA: Diagnosis not present

## 2022-12-22 DIAGNOSIS — E782 Mixed hyperlipidemia: Secondary | ICD-10-CM

## 2022-12-22 DIAGNOSIS — R5383 Other fatigue: Secondary | ICD-10-CM

## 2022-12-22 DIAGNOSIS — I10 Essential (primary) hypertension: Secondary | ICD-10-CM | POA: Diagnosis not present

## 2022-12-22 LAB — POC CREATINE & ALBUMIN,URINE
Creatinine, POC: 300 mg/dL
Microalbumin Ur, POC: 150 mg/L

## 2022-12-22 LAB — POCT URINALYSIS DIPSTICK
Bilirubin, UA: NEGATIVE
Glucose, UA: NEGATIVE
Ketones, UA: NEGATIVE
Nitrite, UA: NEGATIVE
Protein, UA: POSITIVE — AB
Spec Grav, UA: 1.02 (ref 1.010–1.025)
Urobilinogen, UA: 0.2 E.U./dL
pH, UA: 6 (ref 5.0–8.0)

## 2022-12-23 LAB — URINALYSIS, ROUTINE W REFLEX MICROSCOPIC
Bilirubin, UA: NEGATIVE
Glucose, UA: NEGATIVE
Ketones, UA: NEGATIVE
Nitrite, UA: NEGATIVE
Specific Gravity, UA: 1.025 (ref 1.005–1.030)
Urobilinogen, Ur: 0.2 mg/dL (ref 0.2–1.0)
pH, UA: 6 (ref 5.0–7.5)

## 2022-12-23 LAB — CBC WITH DIFFERENTIAL
Basophils Absolute: 0 10*3/uL (ref 0.0–0.2)
Basos: 0 %
EOS (ABSOLUTE): 0.1 10*3/uL (ref 0.0–0.4)
Eos: 1 %
Hematocrit: 39.5 % (ref 34.0–46.6)
Hemoglobin: 12.8 g/dL (ref 11.1–15.9)
Immature Grans (Abs): 0 10*3/uL (ref 0.0–0.1)
Immature Granulocytes: 0 %
Lymphocytes Absolute: 2.2 10*3/uL (ref 0.7–3.1)
Lymphs: 43 %
MCH: 26.5 pg — ABNORMAL LOW (ref 26.6–33.0)
MCHC: 32.4 g/dL (ref 31.5–35.7)
MCV: 82 fL (ref 79–97)
Monocytes Absolute: 0.2 10*3/uL (ref 0.1–0.9)
Monocytes: 4 %
Neutrophils Absolute: 2.5 10*3/uL (ref 1.4–7.0)
Neutrophils: 52 %
RBC: 4.83 x10E6/uL (ref 3.77–5.28)
RDW: 15.8 % — ABNORMAL HIGH (ref 11.7–15.4)
WBC: 5 10*3/uL (ref 3.4–10.8)

## 2022-12-23 LAB — VITAMIN B12: Vitamin B-12: 645 pg/mL (ref 232–1245)

## 2022-12-23 LAB — CMP14+EGFR
ALT: 15 IU/L (ref 0–32)
AST: 15 IU/L (ref 0–40)
Albumin: 4.6 g/dL (ref 4.0–5.0)
Alkaline Phosphatase: 155 IU/L — ABNORMAL HIGH (ref 44–121)
BUN/Creatinine Ratio: 7 — ABNORMAL LOW (ref 9–23)
BUN: 5 mg/dL — ABNORMAL LOW (ref 6–20)
Bilirubin Total: 0.5 mg/dL (ref 0.0–1.2)
CO2: 22 mmol/L (ref 20–29)
Calcium: 9.2 mg/dL (ref 8.7–10.2)
Chloride: 101 mmol/L (ref 96–106)
Creatinine, Ser: 0.71 mg/dL (ref 0.57–1.00)
Globulin, Total: 2.4 g/dL (ref 1.5–4.5)
Glucose: 79 mg/dL (ref 70–99)
Potassium: 3.9 mmol/L (ref 3.5–5.2)
Sodium: 140 mmol/L (ref 134–144)
Total Protein: 7 g/dL (ref 6.0–8.5)
eGFR: 118 mL/min/{1.73_m2} (ref >59)

## 2022-12-23 LAB — LIPID PANEL
Chol/HDL Ratio: 3.4 ratio (ref 0.0–4.4)
Cholesterol, Total: 169 mg/dL (ref 100–199)
HDL: 50 mg/dL (ref >39)
LDL Chol Calc (NIH): 96 mg/dL (ref 0–99)
Triglycerides: 130 mg/dL (ref 0–149)
VLDL Cholesterol Cal: 23 mg/dL (ref 5–40)

## 2022-12-23 LAB — TSH: TSH: 0.766 u[IU]/mL (ref 0.450–4.500)

## 2022-12-23 LAB — MICROSCOPIC EXAMINATION
Casts: NONE SEEN /lpf
Epithelial Cells (non renal): >10 /hpf — AB (ref 0–10)

## 2022-12-23 LAB — VITAMIN D 25 HYDROXY (VIT D DEFICIENCY, FRACTURES): Vit D, 25-Hydroxy: 26.2 ng/mL — ABNORMAL LOW (ref 30.0–100.0)

## 2022-12-23 LAB — HEMOGLOBIN A1C
Est. average glucose Bld gHb Est-mCnc: 131 mg/dL
Hgb A1c MFr Bld: 6.2 % — ABNORMAL HIGH (ref 4.8–5.6)

## 2022-12-24 LAB — URINE CULTURE

## 2022-12-25 LAB — NUSWAB VG+, HSV
Atopobium vaginae: HIGH Score — AB
BVAB 2: HIGH Score — AB
Candida albicans, NAA: NEGATIVE
Candida glabrata, NAA: NEGATIVE
Chlamydia trachomatis, NAA: NEGATIVE
HSV 1 NAA: NEGATIVE
HSV 2 NAA: NEGATIVE
Megasphaera 1: HIGH Score — AB
Neisseria gonorrhoeae, NAA: NEGATIVE
Trich vag by NAA: NEGATIVE

## 2022-12-26 ENCOUNTER — Telehealth: Payer: Self-pay | Admitting: Family

## 2022-12-26 ENCOUNTER — Telehealth: Payer: Self-pay

## 2022-12-26 NOTE — Telephone Encounter (Signed)
Pt called the after hourse nurse line (12/24/22) because she was having irregular vaginal bleeding. I called the pt to f/u on the bleeding and she says she is feeling a lot better, she is currently NOT bleeding. She has stopped taking her birth control. Will f/u as needed.

## 2022-12-26 NOTE — Telephone Encounter (Signed)
Patient left VM inquiring about her lab results. Please advise.

## 2022-12-30 ENCOUNTER — Encounter: Payer: Self-pay | Admitting: Obstetrics and Gynecology

## 2022-12-31 ENCOUNTER — Encounter: Payer: Self-pay | Admitting: Family

## 2022-12-31 DIAGNOSIS — E538 Deficiency of other specified B group vitamins: Secondary | ICD-10-CM | POA: Insufficient documentation

## 2022-12-31 NOTE — Assessment & Plan Note (Signed)
Checking labs today.  Will continue supplements as needed.  

## 2022-12-31 NOTE — Progress Notes (Signed)
Established Patient Office Visit  Subjective:  Patient ID: Penny Gonzalez, female    DOB: [birth year]  Age: 27 y.o. MRN: [id number]  Chief Complaint  Patient presents with   Follow-up    F/U    Patient here with concerns regarding possible vaginitis.  Asks if we can do a swab for her.   She also says that she would like to get some labs checked.     No other concerns at this time.   Past Medical History:  Diagnosis Date   Anemia, iron deficiency, inadequate dietary intake 12/01/2014   Depression    patient has not been officially diagnosed but has symptoms   Diabetes mellitus without complication (HCC)    Recently changed to Glyburide and thinks it is helping   Essential hypertension, benign 03/31/2016   Fetal macrosomia affecting management of mother, antepartum 10/16/2022   History of pre-eclampsia in prior pregnancy, currently pregnant 05/11/2022   History of preterm delivery, currently pregnant 05/11/2022   Irregular periods/menstrual cycles    Joint pain of leg    Major depression 11/23/2015   Obesity in pregnancy 11/29/2017   Positive GBS test 10/06/2022   Pre-existing diabetes mellitus during pregnancy in third trimester 06/24/2014   Pre-existing diabetes mellitus in pregnancy 10/15/2022   Pre-existing essential hypertension during pregnancy in third trimester 09/30/2022   Preexisting diabetes complicating pregnancy, antepartum 07/27/2022   Sciatica     Past Surgical History:  Procedure Laterality Date   CHOLECYSTECTOMY N/A 05/19/2016   Procedure: LAPAROSCOPIC CHOLECYSTECTOMY WITH INTRAOPERATIVE CHOLANGIOGRAM;  Surgeon: Nadeen Landau, MD;  Location: [facility] ORS;  Service: General;  Laterality: N/A;    Social History   Socioeconomic History   Marital status: Single    Spouse name: Not on file   Number of children: 1   Years of education: 13   Highest education level: High school graduate  Occupational History   Occupation: [employer]  Tobacco  Use   Smoking status: Never   Smokeless tobacco: Never  Vaping Use   Vaping status: Never Used  Substance and Sexual Activity   Alcohol use: Not Currently    Comment: OCC   Drug use: No   Sexual activity: Yes    Partners: Male    Birth control/protection: Surgical    Comment: tubal  Other Topics Concern   Not on file  Social History Narrative   Not on file   Social Determinants of Health   Financial Resource Strain: Low Risk  (04/28/2022)   Overall Financial Resource Strain (CARDIA)    Difficulty of Paying Living Expenses: Not hard at all  Food Insecurity: No Food Insecurity (10/15/2022)   Hunger Vital Sign    Worried About Radiation protection practitioner of Food in the Last Year: Never true    Ran Out of Food in the Last Year: Never true  Transportation Needs: No Transportation Needs (10/15/2022)   PRAPARE - Administrator, Civil Service (Medical): No    Lack of Transportation (Non-Medical): No  Physical Activity: Inactive (04/28/2022)   Exercise Vital Sign    Days of Exercise per Week: 0 days    Minutes of Exercise per Session: 0 min  Stress: No Stress Concern Present (04/28/2022)   [employer] of Occupational Health - Occupational Stress Questionnaire    Feeling of Stress : Only a little  Social Connections: Moderately Integrated (04/28/2022)   Social Connection and Isolation Panel [NHANES]    Frequency of Communication with Friends and Family: More than three  times a week    Frequency of Social Gatherings with Friends and Family: Once a week    Attends Religious Services: 1 to 4 times per year    Active Member of [employer] or Organizations: No    Attends Banker Meetings: Never    Marital Status: Living with partner  Intimate Partner Violence: Not At Risk (10/15/2022)   Humiliation, Afraid, Rape, and Kick questionnaire    Fear of Current or Ex-Partner: No    Emotionally Abused: No    Physically Abused: No    Sexually Abused: No    Family History  Problem  Relation Age of Onset   Diabetes Mother    Arthritis Father    Diabetes Father    Heart disease Father    Hypertension Father    Stroke Father    Lupus Sister    Deafness Sister    Diabetes Maternal Grandmother    Hypertension Maternal Grandmother    Heart disease Paternal Grandmother    Hypertension Paternal Grandmother     Allergies  Allergen Reactions   Latex Rash    Pruritic, urticaria    Review of Systems  Constitutional:  Positive for malaise/fatigue.  Genitourinary:  Positive for dysuria.  All other systems reviewed and are negative.      Objective:   BP (!) 145/80   Pulse 80   Ht 5\' 8"  (1.727 m)   Wt 210 lb (95.3 kg)   SpO2 99%   BMI 31.93 kg/m   Vitals:   12/22/22 0922  BP: (!) 145/80  Pulse: 80  Height: 5\' 8"  (1.727 m)  Weight: 210 lb (95.3 kg)  SpO2: 99%  BMI (Calculated): 31.94    Physical Exam Vitals and nursing note reviewed.  Constitutional:      Appearance: Normal appearance. She is normal weight.  HENT:     Head: Normocephalic.  Eyes:     Pupils: Pupils are equal, round, and reactive to light.  Cardiovascular:     Rate and Rhythm: Normal rate.  Pulmonary:     Effort: Pulmonary effort is normal.  Neurological:     General: No focal deficit present.     Mental Status: She is alert and oriented to person, place, and time. Mental status is at baseline.  Psychiatric:        Mood and Affect: Mood normal.        Behavior: Behavior normal.        Thought Content: Thought content normal.        Judgment: Judgment normal.      Results for orders placed or performed in visit on 12/22/22  Culture, Urine   Specimen: Urine   CU  Result Value Ref Range   Urine Culture, Routine Final report    Organism ID, Bacteria Comment   Microscopic Examination  Result Value Ref Range   WBC, UA 0-5 0 - 5 /hpf   RBC, Urine 0-2 0 - 2 /hpf   Epithelial Cells (non renal) >10 (A) 0 - 10 /hpf   Casts None seen None seen /lpf   Bacteria, UA Many (A)  None seen/Few  Lipid panel  Result Value Ref Range   Cholesterol, Total 169 100 - 199 mg/dL   Triglycerides 098 0 - 149 mg/dL   HDL 50 >11 mg/dL   VLDL Cholesterol Cal 23 5 - 40 mg/dL   LDL Chol Calc (NIH) 96 0 - 99 mg/dL   Chol/HDL Ratio 3.4 0.0 - 4.4 ratio  VITAMIN  D 25 Hydroxy (Vit-D Deficiency, Fractures)  Result Value Ref Range   Vit D, 25-Hydroxy 26.2 (L) 30.0 - 100.0 ng/mL  CBC With Differential  Result Value Ref Range   WBC 5.0 3.4 - 10.8 x10E3/uL   RBC 4.83 3.77 - 5.28 x10E6/uL   Hemoglobin 12.8 11.1 - 15.9 g/dL   Hematocrit 96.2 95.2 - 46.6 %   MCV 82 79 - 97 fL   MCH 26.5 (L) 26.6 - 33.0 pg   MCHC 32.4 31.5 - 35.7 g/dL   RDW 84.1 (H) 32.4 - 40.1 %   Neutrophils 52 Not Estab. %   Lymphs 43 Not Estab. %   Monocytes 4 Not Estab. %   Eos 1 Not Estab. %   Basos 0 Not Estab. %   Neutrophils Absolute 2.5 1.4 - 7.0 x10E3/uL   Lymphocytes Absolute 2.2 0.7 - 3.1 x10E3/uL   Monocytes Absolute 0.2 0.1 - 0.9 x10E3/uL   EOS (ABSOLUTE) 0.1 0.0 - 0.4 x10E3/uL   Basophils Absolute 0.0 0.0 - 0.2 x10E3/uL   Immature Granulocytes 0 Not Estab. %   Immature Grans (Abs) 0.0 0.0 - 0.1 x10E3/uL  CMP14+EGFR  Result Value Ref Range   Glucose 79 70 - 99 mg/dL   BUN 5 (L) 6 - 20 mg/dL   Creatinine, Ser 0.27 0.57 - 1.00 mg/dL   eGFR 253 >66 YQ/IHK/7.42   BUN/Creatinine Ratio 7 (L) 9 - 23   Sodium 140 134 - 144 mmol/L   Potassium 3.9 3.5 - 5.2 mmol/L   Chloride 101 96 - 106 mmol/L   CO2 22 20 - 29 mmol/L   Calcium 9.2 8.7 - 10.2 mg/dL   Total Protein 7.0 6.0 - 8.5 g/dL   Albumin 4.6 4.0 - 5.0 g/dL   Globulin, Total 2.4 1.5 - 4.5 g/dL   Bilirubin Total 0.5 0.0 - 1.2 mg/dL   Alkaline Phosphatase 155 (H) 44 - 121 IU/L   AST 15 0 - 40 IU/L   ALT 15 0 - 32 IU/L  TSH  Result Value Ref Range   TSH 0.766 0.450 - 4.500 uIU/mL  Hemoglobin A1c  Result Value Ref Range   Hgb A1c MFr Bld 6.2 (H) 4.8 - 5.6 %   Est. average glucose Bld gHb Est-mCnc 131 mg/dL  Vitamin V95  Result Value Ref  Range   Vitamin B-12 645 232 - 1,245 pg/mL  Urinalysis, Routine w reflex microscopic  Result Value Ref Range   Specific Gravity, UA 1.025 1.005 - 1.030   pH, UA 6.0 5.0 - 7.5   Color, UA Yellow Yellow   Appearance Ur Clear Clear   Leukocytes,UA Trace (A) Negative   Protein,UA 1+ (A) Negative/Trace   Glucose, UA Negative Negative   Ketones, UA Negative Negative   RBC, UA 3+ (A) Negative   Bilirubin, UA Negative Negative   Urobilinogen, Ur 0.2 0.2 - 1.0 mg/dL   Nitrite, UA Negative Negative   Microscopic Examination See below:   POCT urinalysis dipstick  Result Value Ref Range   Color, UA     Clarity, UA     Glucose, UA Negative Negative   Bilirubin, UA Negative    Ketones, UA Negative    Spec Grav, UA 1.020 1.010 - 1.025   Blood, UA 3+    pH, UA 6.0 5.0 - 8.0   Protein, UA Positive (A) Negative   Urobilinogen, UA 0.2 0.2 or 1.0 E.U./dL   Nitrite, UA Negative    Leukocytes, UA Trace (A) Negative   Appearance  Odor    POC CREATINE & ALBUMIN,URINE  Result Value Ref Range   Microalbumin Ur, POC 150 mg/L   Creatinine, POC 300 mg/dL   Albumin/Creatinine Ratio, Urine, POC 30-300   NuSwab VG+, HSV  Result Value Ref Range   Atopobium vaginae High - 2 (A) Score   BVAB 2 High - 2 (A) Score   Megasphaera 1 High - 2 (A) Score   Candida albicans, NAA Negative Negative   Candida glabrata, NAA Negative Negative   Trich vag by NAA Negative Negative   Chlamydia trachomatis, NAA Negative Negative   Neisseria gonorrhoeae, NAA Negative Negative   HSV 1 NAA Negative Negative   HSV 2 NAA Negative Negative    Recent Results (from the past 2160 hour(s))  Cervicovaginal ancillary only     Status: None   Collection Time: 10/05/22 11:10 AM  Result Value Ref Range   Neisseria Gonorrhea Negative    Chlamydia Negative    Comment Normal Reference Ranger Chlamydia - Negative    Comment      Normal Reference Range Neisseria Gonorrhea - Negative  POC Urinalysis Dipstick OB     Status:  Abnormal   Collection Time: 10/05/22 11:21 AM  Result Value Ref Range   Color, UA     Clarity, UA     Glucose, UA Small (1+) (A) Negative   Bilirubin, UA Negative    Ketones, UA Negative    Spec Grav, UA 1.015 1.010 - 1.025   Blood, UA Negative    pH, UA 6.0 5.0 - 8.0   POC,PROTEIN,UA Trace Negative, Trace, Small (1+), Moderate (2+), Large (3+), 4+   Urobilinogen, UA 0.2 0.2 or 1.0 E.U./dL   Nitrite, UA Negative    Leukocytes, UA Negative Negative   Appearance     Odor    Strep Gp B NAA     Status: None   Collection Time: 10/05/22  2:06 PM   VR  Result Value Ref Range   Strep Gp B NAA Negative Negative    Comment: Centers for Disease Control and Prevention (CDC) and American Congress of Obstetricians and Gynecologists (ACOG) guidelines for prevention of perinatal group B streptococcal (GBS) disease specify co-collection of a vaginal and rectal swab specimen to maximize sensitivity of GBS detection. Per the CDC and ACOG, swabbing both the lower vagina and rectum substantially increases the yield of detection compared with sampling the vagina alone. Penicillin G, ampicillin, or cefazolin are indicated for intrapartum prophylaxis of perinatal GBS colonization. Reflex susceptibility testing should be performed prior to use of clindamycin only on GBS isolates from penicillin-allergic women who are considered a high risk for anaphylaxis. Treatment with vancomycin without additional testing is warranted if resistance to clindamycin is noted.   POC Urinalysis Dipstick OB     Status: Normal   Collection Time: 10/12/22 10:37 AM  Result Value Ref Range   Color, UA     Clarity, UA     Glucose, UA Negative Negative   Bilirubin, UA negative    Ketones, UA negative    Spec Grav, UA 1.015 1.010 - 1.025   Blood, UA negative    pH, UA 6.0 5.0 - 8.0   POC,PROTEIN,UA Negative Negative, Trace, Small (1+), Moderate (2+), Large (3+), 4+   Urobilinogen, UA 0.2 0.2 or 1.0 E.U./dL   Nitrite,  UA negative    Leukocytes, UA Negative Negative   Appearance     Odor    CBC     Status: Abnormal  Collection Time: 10/15/22 12:37 AM  Result Value Ref Range   WBC 6.4 4.0 - 10.5 K/uL   RBC 4.12 3.87 - 5.11 MIL/uL   Hemoglobin 11.1 (L) 12.0 - 15.0 g/dL   HCT 16.1 (L) 09.6 - 04.5 %   MCV 83.3 80.0 - 100.0 fL   MCH 26.9 26.0 - 34.0 pg   MCHC 32.4 30.0 - 36.0 g/dL   RDW 40.9 81.1 - 91.4 %   Platelets 240 150 - 400 K/uL   nRBC 0.0 0.0 - 0.2 %    Comment: Performed at [facility], [address] [place], [place] [address]  RPR     Status: None   Collection Time: 10/15/22 12:37 AM  Result Value Ref Range   RPR Ser Ql NON REACTIVE NON REACTIVE    Comment: Performed at [facility], [address] [place], [place] [address]  Type and screen     Status: None   Collection Time: 10/15/22 12:52 AM  Result Value Ref Range   ABO/RH(D) O POS    Antibody Screen NEG    Sample Expiration      10/18/2022,2359 Performed at [facility], [address] [place], [place] [address]   Basic metabolic panel     Status: Abnormal   Collection Time: 10/15/22 12:52 AM  Result Value Ref Range   Sodium 133 (L) 135 - 145 mmol/L   Potassium 4.3 3.5 - 5.1 mmol/L    Comment: HEMOLYSIS AT THIS LEVEL MAY AFFECT RESULT   Chloride 105 98 - 111 mmol/L   CO2 19 (L) 22 - 32 mmol/L   Glucose, Bld 255 (H) 70 - 99 mg/dL    Comment: Glucose reference range applies only to samples taken after fasting for at least 8 hours.   BUN 9 6 - 20 mg/dL   Creatinine, Ser 4.69 0.44 - 1.00 mg/dL   Calcium 9.0 8.9 - 62.9 mg/dL   GFR, Estimated >52 >84 mL/min    Comment: (NOTE) Calculated using the CKD-EPI Creatinine Equation (2021)    Anion gap 9 5 - 15    Comment: Performed at [facility], [address] [place], [place] [address]  Glucose, capillary     Status: Abnormal   Collection Time: 10/15/22  2:05 AM  Result Value Ref Range   Glucose-Capillary 217 (H) 70 - 99 mg/dL     Comment: Glucose reference range applies only to samples taken after fasting for at least 8 hours.  Glucose, capillary     Status: Abnormal   Collection Time: 10/15/22  2:50 AM  Result Value Ref Range   Glucose-Capillary 196 (H) 70 - 99 mg/dL    Comment: Glucose reference range applies only to samples taken after fasting for at least 8 hours.  Glucose, capillary     Status: Abnormal   Collection Time: 10/15/22  3:55 AM  Result Value Ref Range   Glucose-Capillary 179 (H) 70 - 99 mg/dL    Comment: Glucose reference range applies only to samples taken after fasting for at least 8 hours.  Glucose, capillary     Status: Abnormal   Collection Time: 10/15/22  5:04 AM  Result Value Ref Range   Glucose-Capillary 147 (H) 70 - 99 mg/dL    Comment: Glucose reference range applies only to samples taken after fasting for at least 8 hours.  Glucose, capillary     Status: Abnormal   Collection Time: 10/15/22  6:17 AM  Result Value Ref Range  Glucose-Capillary 112 (H) 70 - 99 mg/dL    Comment: Glucose reference range applies only to samples taken after fasting for at least 8 hours.  Glucose, capillary     Status: Abnormal   Collection Time: 10/15/22  7:17 AM  Result Value Ref Range   Glucose-Capillary 114 (H) 70 - 99 mg/dL    Comment: Glucose reference range applies only to samples taken after fasting for at least 8 hours.  Glucose, capillary     Status: Abnormal   Collection Time: 10/15/22  8:21 AM  Result Value Ref Range   Glucose-Capillary 118 (H) 70 - 99 mg/dL    Comment: Glucose reference range applies only to samples taken after fasting for at least 8 hours.  Glucose, capillary     Status: Abnormal   Collection Time: 10/15/22 10:24 AM  Result Value Ref Range   Glucose-Capillary 112 (H) 70 - 99 mg/dL    Comment: Glucose reference range applies only to samples taken after fasting for at least 8 hours.  Glucose, capillary     Status: Abnormal   Collection Time: 10/15/22 12:29 PM  Result  Value Ref Range   Glucose-Capillary 124 (H) 70 - 99 mg/dL    Comment: Glucose reference range applies only to samples taken after fasting for at least 8 hours.  Glucose, capillary     Status: Abnormal   Collection Time: 10/15/22  1:14 PM  Result Value Ref Range   Glucose-Capillary 116 (H) 70 - 99 mg/dL    Comment: Glucose reference range applies only to samples taken after fasting for at least 8 hours.  Glucose, capillary     Status: None   Collection Time: 10/15/22  2:36 PM  Result Value Ref Range   Glucose-Capillary 88 70 - 99 mg/dL    Comment: Glucose reference range applies only to samples taken after fasting for at least 8 hours.  Glucose, capillary     Status: None   Collection Time: 10/15/22  3:39 PM  Result Value Ref Range   Glucose-Capillary 76 70 - 99 mg/dL    Comment: Glucose reference range applies only to samples taken after fasting for at least 8 hours.  Glucose, capillary     Status: None   Collection Time: 10/15/22  4:44 PM  Result Value Ref Range   Glucose-Capillary 96 70 - 99 mg/dL    Comment: Glucose reference range applies only to samples taken after fasting for at least 8 hours.  Glucose, capillary     Status: Abnormal   Collection Time: 10/15/22  5:44 PM  Result Value Ref Range   Glucose-Capillary 110 (H) 70 - 99 mg/dL    Comment: Glucose reference range applies only to samples taken after fasting for at least 8 hours.  Glucose, capillary     Status: None   Collection Time: 10/15/22  6:37 PM  Result Value Ref Range   Glucose-Capillary 99 70 - 99 mg/dL    Comment: Glucose reference range applies only to samples taken after fasting for at least 8 hours.  Glucose, capillary     Status: None   Collection Time: 10/15/22  7:27 PM  Result Value Ref Range   Glucose-Capillary 98 70 - 99 mg/dL    Comment: Glucose reference range applies only to samples taken after fasting for at least 8 hours.  Glucose, capillary     Status: Abnormal   Collection Time: 10/15/22  11:59 PM  Result Value Ref Range   Glucose-Capillary 197 (H) 70 - 99  mg/dL    Comment: Glucose reference range applies only to samples taken after fasting for at least 8 hours.  CBC     Status: Abnormal   Collection Time: 10/16/22  6:46 AM  Result Value Ref Range   WBC 9.0 4.0 - 10.5 K/uL   RBC 3.70 (L) 3.87 - 5.11 MIL/uL   Hemoglobin 10.0 (L) 12.0 - 15.0 g/dL   HCT 16.1 (L) 09.6 - 04.5 %   MCV 83.8 80.0 - 100.0 fL   MCH 27.0 26.0 - 34.0 pg   MCHC 32.3 30.0 - 36.0 g/dL   RDW 40.9 81.1 - 91.4 %   Platelets 183 150 - 400 K/uL   nRBC 0.0 0.0 - 0.2 %    Comment: Performed at [facility], [address] [place], [place] [address]  Glucose, capillary     Status: None   Collection Time: 10/16/22  8:42 AM  Result Value Ref Range   Glucose-Capillary 98 70 - 99 mg/dL    Comment: Glucose reference range applies only to samples taken after fasting for at least 8 hours.  Glucose, capillary     Status: Abnormal   Collection Time: 10/16/22  3:30 PM  Result Value Ref Range   Glucose-Capillary 67 (L) 70 - 99 mg/dL    Comment: Glucose reference range applies only to samples taken after fasting for at least 8 hours.  Glucose, capillary     Status: None   Collection Time: 10/16/22 10:01 PM  Result Value Ref Range   Glucose-Capillary 96 70 - 99 mg/dL    Comment: Glucose reference range applies only to samples taken after fasting for at least 8 hours.  Glucose, capillary     Status: Abnormal   Collection Time: 10/17/22  8:14 AM  Result Value Ref Range   Glucose-Capillary 108 (H) 70 - 99 mg/dL    Comment: Glucose reference range applies only to samples taken after fasting for at least 8 hours.  Glucose, capillary     Status: Abnormal   Collection Time: 10/17/22 12:29 PM  Result Value Ref Range   Glucose-Capillary 120 (H) 70 - 99 mg/dL    Comment: Glucose reference range applies only to samples taken after fasting for at least 8 hours.  Cytology - PAP     Status: None   Collection  Time: 11/16/22 10:06 AM  Result Value Ref Range   Adequacy      Satisfactory for evaluation; transformation zone component ABSENT.   Diagnosis      - Negative for intraepithelial lesion or malignancy (NILM)  Lipid panel     Status: None   Collection Time: 12/22/22 10:18 AM  Result Value Ref Range   Cholesterol, Total 169 100 - 199 mg/dL   Triglycerides 621 0 - 149 mg/dL   HDL 50 >30 mg/dL   VLDL Cholesterol Cal 23 5 - 40 mg/dL   LDL Chol Calc (NIH) 96 0 - 99 mg/dL   Chol/HDL Ratio 3.4 0.0 - 4.4 ratio    Comment:                                   T. Chol/HDL Ratio                                             Men  Women                               1/2 Avg.Risk  3.4    3.3                                   Avg.Risk  5.0    4.4                                2X Avg.Risk  9.6    7.1                                3X Avg.Risk 23.4   11.0   VITAMIN D 25 Hydroxy (Vit-D Deficiency, Fractures)     Status: Abnormal   Collection Time: 12/22/22 10:18 AM  Result Value Ref Range   Vit D, 25-Hydroxy 26.2 (L) 30.0 - 100.0 ng/mL    Comment: Vitamin D deficiency has been defined by the Institute of Medicine and an Endocrine Society practice guideline as a level of serum 25-OH vitamin D less than 20 ng/mL (1,2). The Endocrine Society went on to further define vitamin D insufficiency as a level between 21 and 29 ng/mL (2). 1. IOM (Institute of Medicine). 2010. Dietary reference    intakes for calcium and D. [place]: The    [employer]. 2. Holick MF, Binkley Ogden, Bischoff-Ferrari HA, et al.    Evaluation, treatment, and prevention of vitamin D    deficiency: an Endocrine Society clinical practice    guideline. JCEM. 2011 Jul; 96(7):1911-30.   CBC With Differential     Status: Abnormal   Collection Time: 12/22/22 10:18 AM  Result Value Ref Range   WBC 5.0 3.4 - 10.8 x10E3/uL   RBC 4.83 3.77 - 5.28 x10E6/uL   Hemoglobin 12.8 11.1 - 15.9 g/dL   Hematocrit 40.9 81.1 - 46.6 %    MCV 82 79 - 97 fL   MCH 26.5 (L) 26.6 - 33.0 pg   MCHC 32.4 31.5 - 35.7 g/dL   RDW 91.4 (H) 78.2 - 95.6 %   Neutrophils 52 Not Estab. %   Lymphs 43 Not Estab. %   Monocytes 4 Not Estab. %   Eos 1 Not Estab. %   Basos 0 Not Estab. %   Neutrophils Absolute 2.5 1.4 - 7.0 x10E3/uL   Lymphocytes Absolute 2.2 0.7 - 3.1 x10E3/uL   Monocytes Absolute 0.2 0.1 - 0.9 x10E3/uL   EOS (ABSOLUTE) 0.1 0.0 - 0.4 x10E3/uL   Basophils Absolute 0.0 0.0 - 0.2 x10E3/uL   Immature Granulocytes 0 Not Estab. %   Immature Grans (Abs) 0.0 0.0 - 0.1 x10E3/uL    Comment: **Effective January 09, 2023, profile [id number] CBC/Differential**   (No Platelet) will be made non-orderable. [facility] Offers:   [id number] CBC With Differential/Platelet   CMP14+EGFR     Status: Abnormal   Collection Time: 12/22/22 10:18 AM  Result Value Ref Range   Glucose 79 70 - 99 mg/dL   BUN 5 (L) 6 - 20 mg/dL   Creatinine, Ser 5.78 0.57 - 1.00 mg/dL   eGFR 469 >62 XB/MWU/1.32   BUN/Creatinine Ratio 7 (L) 9 - 23   Sodium 140 134 - 144 mmol/L   Potassium 3.9 3.5 -  5.2 mmol/L   Chloride 101 96 - 106 mmol/L   CO2 22 20 - 29 mmol/L   Calcium 9.2 8.7 - 10.2 mg/dL   Total Protein 7.0 6.0 - 8.5 g/dL   Albumin 4.6 4.0 - 5.0 g/dL   Globulin, Total 2.4 1.5 - 4.5 g/dL   Bilirubin Total 0.5 0.0 - 1.2 mg/dL   Alkaline Phosphatase 155 (H) 44 - 121 IU/L   AST 15 0 - 40 IU/L   ALT 15 0 - 32 IU/L  TSH     Status: None   Collection Time: 12/22/22 10:18 AM  Result Value Ref Range   TSH 0.766 0.450 - 4.500 uIU/mL  Hemoglobin A1c     Status: Abnormal   Collection Time: 12/22/22 10:18 AM  Result Value Ref Range   Hgb A1c MFr Bld 6.2 (H) 4.8 - 5.6 %    Comment:          Prediabetes: 5.7 - 6.4          Diabetes: >6.4          Glycemic control for adults with diabetes: <7.0    Est. average glucose Bld gHb Est-mCnc 131 mg/dL  Vitamin P32     Status: None   Collection Time: 12/22/22 10:18 AM  Result Value Ref Range   Vitamin B-12 645 232 - 1,245 pg/mL   POCT urinalysis dipstick     Status: Abnormal   Collection Time: 12/22/22 10:29 AM  Result Value Ref Range   Color, UA     Clarity, UA     Glucose, UA Negative Negative   Bilirubin, UA Negative    Ketones, UA Negative    Spec Grav, UA 1.020 1.010 - 1.025   Blood, UA 3+    pH, UA 6.0 5.0 - 8.0   Protein, UA Positive (A) Negative   Urobilinogen, UA 0.2 0.2 or 1.0 E.U./dL   Nitrite, UA Negative    Leukocytes, UA Trace (A) Negative   Appearance     Odor    POC CREATINE & ALBUMIN,URINE     Status: Abnormal   Collection Time: 12/22/22 10:29 AM  Result Value Ref Range   Microalbumin Ur, POC 150 mg/L   Creatinine, POC 300 mg/dL   Albumin/Creatinine Ratio, Urine, POC 30-300   NuSwab VG+, HSV     Status: Abnormal   Collection Time: 12/22/22 10:39 AM  Result Value Ref Range   Atopobium vaginae High - 2 (A) Score   BVAB 2 High - 2 (A) Score   Megasphaera 1 High - 2 (A) Score    Comment: Calculate total score by adding the 3 individual bacterial vaginosis (BV) marker scores together.  Total score is interpreted as follows: Total score 0-1: Indicates the absence of BV. Total score   2: Indeterminate for BV. Additional clinical                  data should be evaluated to establish a                  diagnosis. Total score 3-6: Indicates the presence of BV.    Candida albicans, NAA Negative Negative   Candida glabrata, NAA Negative Negative   Trich vag by NAA Negative Negative   Chlamydia trachomatis, NAA Negative Negative   Neisseria gonorrhoeae, NAA Negative Negative   HSV 1 NAA Negative Negative   HSV 2 NAA Negative Negative  Culture, Urine     Status: None   Collection Time: 12/22/22 10:41  AM   Specimen: Urine   CU  Result Value Ref Range   Urine Culture, Routine Final report    Organism ID, Bacteria Comment     Comment: Culture shows less than 10,000 colony forming units of bacteria per milliliter of urine. This colony count is not generally considered to be clinically  significant.   Urinalysis, Routine w reflex microscopic     Status: Abnormal   Collection Time: 12/22/22 10:44 AM  Result Value Ref Range   Specific Gravity, UA 1.025 1.005 - 1.030   pH, UA 6.0 5.0 - 7.5   Color, UA Yellow Yellow   Appearance Ur Clear Clear   Leukocytes,UA Trace (A) Negative   Protein,UA 1+ (A) Negative/Trace   Glucose, UA Negative Negative   Ketones, UA Negative Negative   RBC, UA 3+ (A) Negative   Bilirubin, UA Negative Negative   Urobilinogen, Ur 0.2 0.2 - 1.0 mg/dL   Nitrite, UA Negative Negative   Microscopic Examination See below:     Comment: Microscopic was indicated and was performed.  Microscopic Examination     Status: Abnormal   Collection Time: 12/22/22 10:44 AM  Result Value Ref Range   WBC, UA 0-5 0 - 5 /hpf   RBC, Urine 0-2 0 - 2 /hpf   Epithelial Cells (non renal) >10 (A) 0 - 10 /hpf   Casts None seen None seen /lpf   Bacteria, UA Many (A) None seen/Few       Assessment & Plan:   Problem List Items Addressed This Visit       Active Problems   Type 2 diabetes mellitus with hyperglycemia, without long-term current use of insulin (HCC)    Checking labs today. Will call pt. With results  Continue current diabetes POC, as patient has been well controlled on current regimen.  Will adjust meds if needed based on labs.       Relevant Orders   CBC With Differential (Completed)   CMP14+EGFR (Completed)   Hemoglobin A1c (Completed)   POC CREATINE & ALBUMIN,URINE (Completed)   Other fatigue   Relevant Orders   CBC With Differential (Completed)   CMP14+EGFR (Completed)   TSH (Completed)   Vitamin D deficiency - Primary    Checking labs today.  Will continue supplements as needed.       Relevant Orders   VITAMIN D 25 Hydroxy (Vit-D Deficiency, Fractures) (Completed)   CBC With Differential (Completed)   CMP14+EGFR (Completed)   Essential hypertension, benign (Chronic)    Blood pressure well controlled with current medications.   Continue current therapy.  Will reassess at follow up.        Relevant Orders   CBC With Differential (Completed)   CMP14+EGFR (Completed)   B12 deficiency due to diet    Checking labs today.  Will continue supplements as needed.        Relevant Orders   CBC With Differential (Completed)   CMP14+EGFR (Completed)   Vitamin B12 (Completed)   Other Visit Diagnoses     Contact with and (suspected) exposure to infections with a predominantly sexual mode of transmission       Relevant Orders   CBC With Differential (Completed)   CMP14+EGFR (Completed)   NuSwab VG+, HSV (Completed)   Acute vaginitis       NuSwab sent today.  Will call with results.   Relevant Orders   CBC With Differential (Completed)   CMP14+EGFR (Completed)   NuSwab VG+, HSV (Completed)   Mixed hyperlipidemia  Relevant Orders   Lipid panel (Completed)   CBC With Differential (Completed)   CMP14+EGFR (Completed)   Hair loss       Relevant Orders   CBC With Differential (Completed)   CMP14+EGFR (Completed)   TSH (Completed)   Dysuria       Relevant Orders   Urinalysis, Routine w reflex microscopic (Completed)   Culture, Urine (Completed)   POCT urinalysis dipstick (Completed)   BMI 31.0-31.9,adult           Return in about 1 month (around 01/22/2023) for F/U.   Total time spent: 30 minutes  Miki Kins, FNP  12/22/2022   This document may have been prepared by [facility] Voice Recognition software and as such may include unintentional dictation errors.

## 2022-12-31 NOTE — Assessment & Plan Note (Signed)
Blood pressure well controlled with current medications.  Continue current therapy.  Will reassess at follow up.  

## 2022-12-31 NOTE — Assessment & Plan Note (Signed)
Checking labs today. Will call pt. With results  Continue current diabetes POC, as patient has been well controlled on current regimen.  Will adjust meds if needed based on labs.  

## 2023-01-02 ENCOUNTER — Encounter (HOSPITAL_COMMUNITY): Payer: Self-pay | Admitting: Clinical

## 2023-01-02 ENCOUNTER — Ambulatory Visit (HOSPITAL_COMMUNITY): Payer: BC Managed Care – PPO | Admitting: Clinical

## 2023-01-02 DIAGNOSIS — F419 Anxiety disorder, unspecified: Secondary | ICD-10-CM | POA: Diagnosis not present

## 2023-01-02 DIAGNOSIS — F53 Postpartum depression: Secondary | ICD-10-CM | POA: Diagnosis not present

## 2023-01-02 NOTE — Progress Notes (Unsigned)
THERAPIST PROGRESS NOTE  Session Time: 8:07-9:05am  Session #2  Participation Level: Active  Behavioral Response: Well Groomed Alert Euthymic  Type of Therapy: Individual Therapy  Treatment Goals addressed:  LTG: [name] will score less than 5 on the Generalized Anxiety Disorder 7 Scale (GAD-7) STG: [name] will complete at least 80% of assigned homework STG: [name] will practice problem solving skills 3 times per week for the next 4 weeks. STG: [name] will reduce frequency of avoidant behaviors by 50% as evidenced by self-report in therapy sessions LTG: Increase coping skills to manage depression and improve ability to perform daily activities LTG: [name] will score less than 9 on the Patient Health Questionnaire (PHQ-9) STG: [name] will participate in at least 80% of scheduled individual psychotherapy sessions STG: [name] will identify cognitive patterns and beliefs that support depression  ProgressTowards Goals: Progressing  Interventions: Supportive and Social Skills Training  Summary: Penny Gonzalez is a 27 y.o. female who presents with post-partum depression and anxiety for therapy.   She reports that as her baby is growing, she is figuring out how to eat with the children rather than waiting until they are finished.  Therefore she is feeling better.  Her partner is helping to take care of the baby and this gives her time to take care of her personal hygiene issues.  She has returned to work and finds it a relief to have time around adults as well as time to herself.  There has been some anxiety surrounding her adapting to the new schedule.  In August her partner will restart his 2nd job so this schedule will change yet again and he will actually no longer be available to help except for 2 days a week.  They are paying her mother $200 weekly to keep the baby and she talked about her willingness to do this, knowing it is less expensive and more reliable than other  options.  She is wearing a wrist brace, has been experiencing significant hand pain if she goes without it.  We discussed some of the changes her body is going through.  She stopped taking her birth control pills because her hair was falling out.   Because of hair loss associated with trauma earlier in her life, this triggered some emotions reactions from her but she has felt better since having her hair done.  We discussed the goals she might have for therapy and she settled on these:  Work through the things blocking her from happiness. Learn how to break out of her shell in order to do things for herself and for other people. Improve relationship with daughter. Improve how she gets along with son's father (her partner), including figuring out if she can trust him again.  (She explained that she has found out he has been talking to other women.  Her previous partner cheated on her and this is a bad trigger for her.  Her first child's father actually impregnated somebody else.  Even though her current partner told her nothing had been physical, she got herself tested for STDs to ensure she does not have to think about that all the time.  She tested negative.  She states things are getting better because they have had some good conversations.) Have better boundaries.  (This is specifically with her brother who is a drug addict.  She is afraid she is going to lose him.  We processed what detachment can look like and how it can help both herself and the  addict.) Don't run away but stand and fight (she has always run away before).  Suicidal/Homicidal: No without intent/plan  Therapist Response: Patient is progressing AEB engaging in scheduled therapy session.  She presented oriented x5 and stated she was feeling "pretty good."  CSW evaluated patient's medication compliance and self-care since last session.  CSW reviewed the last session with patient who reported she is now back and work and is enjoying it,  finds it to be relaxing compared to being at home.  Patient stated she has concerns about her current partner and her usual tactic would be to run away; instead she is trying to stay and fight for the relationship.  CSW taught coping skills, specifically "I" statements and detachment.  Patient received these willingly and could discuss them in a way to indicate good comprehension.    Throughout the session, CSW gave patient the opportunity to explore thoughts and feelings associated with current life situations and past/present external stressors.   CSW encouraged patient's expression of feelings and validated patient's thoughts using empathy, active listening, open body language, and unconditional positive regard.    Plan: Return again in 4 weeks.  Next appointment:  8/22  Recommendations:  Return to therapy in 4 weeks, engage in self care behaviors, work on explaining fair communication/fighting rules to partner and trying to have discussions with him in those parameters, think about goals  Diagnosis:  Postpartum depression associated with second pregnancy  Anxiety disorder, unspecified type  Collaboration of Care: Primary Care Provider AEB - can see notes in Epic as needed  Patient/Guardian was advised Release of Information must be obtained prior to any record release in order to collaborate their care with an outside provider. Patient/Guardian was advised if they have not already done so to contact the registration department to sign all necessary forms in order for [place] to release information regarding their care.   Consent: Patient/Guardian gives verbal consent for treatment and assignment of benefits for services provided during this visit. Patient/Guardian expressed understanding and agreed to proceed.   Lynnell Chad, LCSW 01/02/2023

## 2023-01-10 ENCOUNTER — Other Ambulatory Visit: Payer: Self-pay

## 2023-01-10 MED ORDER — METRONIDAZOLE 500 MG PO TABS: 500.0000 mg | ORAL_TABLET | Freq: Two times a day (BID) | ORAL | 0 refills | Status: DC

## 2023-02-02 ENCOUNTER — Ambulatory Visit (HOSPITAL_COMMUNITY): Payer: BC Managed Care – PPO | Admitting: Clinical

## 2023-02-16 ENCOUNTER — Encounter (HOSPITAL_COMMUNITY): Payer: Self-pay | Admitting: Clinical

## 2023-02-16 ENCOUNTER — Ambulatory Visit (INDEPENDENT_AMBULATORY_CARE_PROVIDER_SITE_OTHER): Payer: BC Managed Care – PPO | Admitting: Clinical

## 2023-02-16 DIAGNOSIS — F53 Postpartum depression: Secondary | ICD-10-CM

## 2023-02-16 DIAGNOSIS — F419 Anxiety disorder, unspecified: Secondary | ICD-10-CM | POA: Diagnosis not present

## 2023-02-16 NOTE — Progress Notes (Signed)
THERAPIST PROGRESS NOTE  Session Time: 8:05-9:05am  Session #3  Participation Level: Active  Behavioral Response: Well Groomed Alert Euthymic and Tearful  Type of Therapy: Individual Therapy  Treatment Goals addressed:  LTG: [name] will score less than 5 on the Generalized Anxiety Disorder 7 Scale (GAD-7) STG: Penny Gonzalez will complete at least 80% of assigned homework STG: Penny Gonzalez will practice problem solving skills 3 times per week for the next 4 weeks. STG: Penny Gonzalez will reduce frequency of avoidant behaviors by 50% as evidenced by self-report in therapy sessions LTG: Increase coping skills to manage depression and improve ability to perform daily activities LTG: Penny Gonzalez will score less than 9 on the Patient Health Questionnaire (PHQ-9) STG: Penny Gonzalez will participate in at least 80% of scheduled individual psychotherapy sessions STG: Penny Gonzalez will identify cognitive patterns and beliefs that support depression  We discussed the goals she might have for therapy and she settled on these:  Work through the things blocking her from happiness. Learn how to break out of her shell in order to do things for herself and for other people. Improve relationship with daughter. Improve how she gets along with son's father (her partner), including figuring out if she can trust him again.  (She explained that she has found out he has been talking to other women.  Her previous partner cheated on her and this is a bad trigger for her.  Her first child's father actually impregnated somebody else.  Even though her current partner told her nothing had been physical, she got herself tested for STDs to ensure she does not have to think about that all the time.  She tested negative.  She states things are getting better because they have had some good conversations.) Have better boundaries.  (This is specifically with her brother who is a drug addict.  She is afraid she is going to lose him.  We  processed what detachment can look like and how it can help both herself and the addict.) Don't run away but stand and fight (she has always run away before).  ProgressTowards Goals: Progressing  Interventions: Supportive, Psychologist, occupational, and Other: boundaries  Summary: Penny Gonzalez is a 27 y.o. female who presents with post-partum depression and anxiety for therapy.   Patient stated she was feeling "fine this morning" with lessening depression.  She has been working with her partner and parents to establish a new routine with her 8yo daughter back in school and taking her baby to her parents for babysitting.  Things are working out fine with these items.  Prior to this, she had been under a lot of stress because her partner was not working and became moody.  Now that he is working, he is happier and helping out.  She feels she has made progress in her boundaries with her father and brother, as well as in communicating and improving relationship with daughter.  She has been working on her relationship with partner, with a lot of occasions to walk away to prevent escalation of emotions. She became tearful, however, in talking about the relationship because she does not know if they are moving forward in their relationship or drifting apart.  There has not been anger or arguments, but she is starting to feel as though they are simply co-existing.  He has reassured her, when asked, that his goal are the same -- to marry her.  But she is actually not sure that is what she wants.  In the past when  she has been through with a relationship, that has been it.  We processed at length how she can be patient and take time to see if changes are made that are for the better and are permanent.  She feels like this is her last chance for a long-term relationship because she has already decided not to try again if this one does not last.  Again, this decision about the future without all the known  information was discussed and processed.  Suicidal/Homicidal: No without intent/plan  Therapist Response:  Patient is progressing AEB engaging in scheduled therapy session.  She presented oriented x5 and stated she was feeling "fine this morning, less depressed."  CSW evaluated patient's self-care since last session.  She derives great satisfaction from getting her hair and nails done.  Her hair is growing back from where she had lost hair, and she is very pleased.  CSW reviewed the last session with patient who reported she has been using "I" statements in talking with her partner and feels it has helped.  Patient stated she has asked him questions about how he feels the relationship is progressing, mostly because of her own trepidations about it.  Although he has reassured her of his own intentions, she is not sure about her own.  CSW encouraged patient to schedule more therapy sessions for the future, as only one more is scheduled presently.  Throughout the session, CSW gave patient the opportunity to explore thoughts and feelings associated with current life situations and past/present external stressors.   CSW encouraged patient's expression of feelings and validated patient's thoughts using empathy, active listening, open body language, and unconditional positive regard.     Plan: Return again in 3 weeks.  Next appointment: 9/25  Recommendations:  Return to therapy in 3 weeks, engage in self care behaviors, continue to work on boundaries  Diagnosis:  Postpartum depression associated with second pregnancy  Anxiety disorder, unspecified type  Collaboration of Care: Primary Care Provider AEB - can see notes in Epic as needed  Patient/Guardian was advised Release of Information must be obtained prior to any record release in order to collaborate their care with an outside provider. Patient/Guardian was advised if they have not already done so to contact the registration department to sign all  necessary forms in order for [place] to release information regarding their care.   Consent: Patient/Guardian gives verbal consent for treatment and assignment of benefits for services provided during this visit. Patient/Guardian expressed understanding and agreed to proceed.   Lynnell Chad, LCSW 02/16/2023

## 2023-02-20 ENCOUNTER — Telehealth: Payer: Self-pay | Admitting: Family

## 2023-02-20 NOTE — Telephone Encounter (Signed)
Patient left VM that she thinks she has BV, would like something called in. Are you okay with sending something in or do you want her to come in?

## 2023-02-21 ENCOUNTER — Other Ambulatory Visit: Payer: Self-pay

## 2023-02-21 MED ORDER — METRONIDAZOLE 500 MG PO TABS: 500.0000 mg | ORAL_TABLET | Freq: Two times a day (BID) | ORAL | 0 refills | Status: DC

## 2023-02-21 NOTE — Telephone Encounter (Signed)
Per Marchelle Folks verbal, if patient can come do a self swab we can send in Metronidazole.

## 2023-02-21 NOTE — Telephone Encounter (Signed)
Rx was sent to pharmacy. 

## 2023-03-08 ENCOUNTER — Ambulatory Visit (INDEPENDENT_AMBULATORY_CARE_PROVIDER_SITE_OTHER): Payer: BC Managed Care – PPO | Admitting: Clinical

## 2023-03-08 ENCOUNTER — Encounter (HOSPITAL_COMMUNITY): Payer: Self-pay | Admitting: Clinical

## 2023-03-08 DIAGNOSIS — F419 Anxiety disorder, unspecified: Secondary | ICD-10-CM

## 2023-03-08 DIAGNOSIS — F53 Postpartum depression: Secondary | ICD-10-CM | POA: Diagnosis not present

## 2023-03-08 DIAGNOSIS — Z630 Problems in relationship with spouse or partner: Secondary | ICD-10-CM

## 2023-03-08 NOTE — Progress Notes (Signed)
THERAPIST PROGRESS NOTE  Session Time: 8:01-9:01am  Session #4  Participation Level: Active  Behavioral Response: Well Groomed Lethargic Depressed and Hopeless  Type of Therapy: Individual Therapy  Treatment Goals addressed:  LTG: Penny Gonzalez will score less than 5 on the Generalized Anxiety Disorder 7 Scale (GAD-7) STG: Penny Gonzalez will complete at least 80% of assigned homework STG: Penny Gonzalez will practice problem solving skills 3 times per week for the next 4 weeks. STG: Penny Gonzalez will reduce frequency of avoidant behaviors by 50% as evidenced by self-report in therapy sessions LTG: Increase coping skills to manage depression and improve ability to perform daily activities LTG: Penny Gonzalez will score less than 9 on the Patient Health Questionnaire (PHQ-9) STG: Penny Gonzalez will participate in at least 80% of scheduled individual psychotherapy sessions STG: Penny Gonzalez will identify cognitive patterns and beliefs that support depression  We discussed the goals she might have for therapy and she settled on these:  Work through the things blocking her from happiness. Learn how to break out of her shell in order to do things for herself and for other people. Improve relationship with daughter. Improve how she gets along with son's father (her partner), including figuring out if she can trust him again.  (She explained that she has found out he has been talking to other women.  Her previous partner cheated on her and this is a bad trigger for her.  Her first child's father actually impregnated somebody else.  Even though her current partner told her nothing had been physical, she got herself tested for STDs to ensure she does not have to think about that all the time.  She tested negative.  She states things are getting better because they have had some good conversations.) Have better boundaries.  (This is specifically with her brother who is a drug addict.  She is afraid she is going to lose him.  We  processed what detachment can look like and how it can help both herself and the addict.) Don't run away but stand and fight (she has always run away before).  ProgressTowards Goals: Progressing  Interventions: Supportive, Psychologist, occupational, and Other: boundaries  Summary: Penny Gonzalez is a 27 y.o. female who presents with post-partum depression and anxiety for therapy.   She described in detail a trip over the weekend that was 10-12 hours driving each way, to spend 24 hours there (in [place]) for a family celebration.  She stated everything was "good" although she is exhausted.  Yesterday, however, she had a "moment" when she looked at her partner's phone and saw a conversation with his cousin in which the cousin was telling the partner that the patient is insecure.  She stated that she is not insecure but rather does not trust her partner because of his own misdeeds of talking in a sexual manner with multiple women earlier this year.  She continues to feel she can never trust him again, is particularly sensitive on this issue because of an earlier relationship with her older child's father that ended due to his actual infidelity and impregnation of another woman.  She talked repeatedly, as she has before, about her partner having a hidden agenda.  CSW presented a different take on, especially given that he said to cousin that he came here to love on his wife and son, which shows an unpolished statement he is making to someone else, not even to her.  When asked if she has forgiven him, she stated "Yes and no" to  which CSW pointed out that of necessity means no.  She continues to wonder "How could you?" and to fortune-tell the future that if the partner had not been caught, he would definitely have proceeded to sleep with these other women.  The use of cognitive distortions was pointed out and we worked through other possible conclusions.  CSW talked about how he cannot change the past and  she does have choices to make about the future.  CSW suggested that she not make assumptions about his thoughts, but rather that she come straight out and ask him questions she has.  We talked about forgiveness (what it is and what it isn't).  She stated one of her main feelings now is disappointment in both him and in herself (because it happened to her "again").  We processed the ways in which this injustice has changed her behavior, including going back to her "old ways" of withdrawing, being instantly suspicion if she hears even a ding on his phone of receiving a message, and not trusting him.    Suicidal/Homicidal: No without intent/plan  Therapist Response:  Patient is progressing AEB engaging in scheduled therapy session.  She presented oriented x5 and stated she was feeling "exhausted."  CSW evaluated patient's self-care since last session which had continued to be good, which was pointed out to her.  Patient talked throughout session about the difficulties caused to her current relationship that she cannot move beyond and that might end that relationship.  CSW pointed out cognitive distortions and recommended the use of direct questions if she really wants or needs to know what her partner is thinking.  Patient received this attentively. CSW encouraged patient to schedule more therapy sessions for the future, as only the month of October is covered currently.  Throughout the session, CSW gave patient the opportunity to explore thoughts and feelings associated with current life situations and past/present external stressors.   CSW encouraged patient's expression of feelings and validated patient's thoughts using empathy, active listening, open body language, and unconditional positive regard.       Plan: Return again in 3 weeks.  Next appointment: 10/18  Recommendations:  Return to therapy in 3 weeks, engage in self care behaviors, think about what was discussed regarding forgiveness and  trust  Diagnosis:  Postpartum depression associated with second pregnancy  Anxiety disorder, unspecified type  Relationship problem between partners  Collaboration of Care: Primary Care Provider AEB - can see notes in Epic as needed  Patient/Guardian was advised Release of Information must be obtained prior to any record release in order to collaborate their care with an outside provider. Patient/Guardian was advised if they have not already done so to contact the registration department to sign all necessary forms in order for [place] to release information regarding their care.   Consent: Patient/Guardian gives verbal consent for treatment and assignment of benefits for services provided during this visit. Patient/Guardian expressed understanding and agreed to proceed.   Lynnell Chad, LCSW 03/08/2023

## 2023-03-30 ENCOUNTER — Telehealth: Payer: Self-pay | Admitting: Family

## 2023-03-30 NOTE — Telephone Encounter (Signed)
Patient left VM that she wants to discuss her diabetes with Penny Gonzalez. She also states she woke up the other night and her head was spinning. Wants to discuss that also. Please call and make an appt.

## 2023-03-31 ENCOUNTER — Encounter (HOSPITAL_COMMUNITY): Payer: Self-pay | Admitting: Clinical

## 2023-03-31 ENCOUNTER — Ambulatory Visit (INDEPENDENT_AMBULATORY_CARE_PROVIDER_SITE_OTHER): Payer: BC Managed Care – PPO | Admitting: Clinical

## 2023-03-31 DIAGNOSIS — F419 Anxiety disorder, unspecified: Secondary | ICD-10-CM

## 2023-03-31 DIAGNOSIS — F53 Postpartum depression: Secondary | ICD-10-CM

## 2023-03-31 DIAGNOSIS — Z630 Problems in relationship with spouse or partner: Secondary | ICD-10-CM
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
## 2023-04-06 ENCOUNTER — Ambulatory Visit (INDEPENDENT_AMBULATORY_CARE_PROVIDER_SITE_OTHER): Payer: Medicaid Other | Admitting: Clinical

## 2023-04-06 DIAGNOSIS — F419 Anxiety disorder, unspecified: Secondary | ICD-10-CM | POA: Diagnosis not present

## 2023-04-06 DIAGNOSIS — Z630 Problems in relationship with spouse or partner: Secondary | ICD-10-CM | POA: Diagnosis not present

## 2023-04-06 DIAGNOSIS — F53 Postpartum depression: Secondary | ICD-10-CM | POA: Diagnosis not present

## 2023-04-06 NOTE — Progress Notes (Unsigned)
THERAPIST PROGRESS NOTE  Session Time: 8:03-9:03am  Session #6  Participation Level: Active  Behavioral Response: Well Groomed Alert Euthymic  Type of Therapy: Individual Therapy  Treatment Goals addressed: *** LTG: Keierra will score less than 5 on the Generalized Anxiety Disorder 7 Scale (GAD-7) STG: Annetra will complete at least 80% of assigned homework STG: Brendaliz will practice problem solving skills 3 times per week for the next 4 weeks. STG: Kayelyn will reduce frequency of avoidant behaviors by 50% as evidenced by self-report in therapy sessions LTG: Increase coping skills to manage depression and improve ability to perform daily activities LTG: Nunziata will score less than 9 on the Patient Health Questionnaire (PHQ-9) STG: Roxianne will participate in at least 80% of scheduled individual psychotherapy sessions STG: Maurissa will identify cognitive patterns and beliefs that support depression  ProgressTowards Goals: Progressing  Interventions: Supportive and Family Systems ***  Summary: Penny BEHNER is a 27 y.o. female who presents with post-partum depression and anxiety for therapy.   ***  Suicidal/Homicidal: No without intent/plan  Therapist Response:  Patient is progressing AEB engaging in scheduled therapy session.  ***e presented oriented x5 and stated ***he was feeling "***."  CSW evaluated patient's medication compliance, use of coping tools, and self-care, as applicable.  Patient stated ***.  CSW taught ***.  Patient received this willingly and indicated *** comprehension.  CSW assigned patient homework of ***.  CSW encouraged patient to schedule more therapy sessions for the future, as ***.  Throughout the session, CSW gave patient the opportunity to explore thoughts and feelings associated with current life situations and past/present stressors.   CSW challenged patient gently and appropriately to consider different ways of looking at reported issues. CSW  encouraged patient's expression of feelings and validated these using empathy, active listening, open body language, and unconditional positive regard.       Plan: Return again in 2 weeks  Next appointment: 11/7  Recommendations:  Return to therapy in 2 weeks, engage in self care behaviors,***  Diagnosis:  Postpartum depression associated with second pregnancy  Anxiety disorder, unspecified type  Relationship problem between partners  Collaboration of Care: Primary Care Provider AEB - can see notes in Epic as needed  Patient/Guardian was advised Release of Information must be obtained prior to any record release in order to collaborate their care with an outside provider. Patient/Guardian was advised if they have not already done so to contact the registration department to sign all necessary forms in order for [place] to release information regarding their care.   Consent: Patient/Guardian gives verbal consent for treatment and assignment of benefits for services provided during this visit. Patient/Guardian expressed understanding and agreed to proceed.   Lynnell Chad, LCSW 04/07/2023

## 2023-04-07 ENCOUNTER — Encounter (HOSPITAL_COMMUNITY): Payer: Self-pay | Admitting: Clinical

## 2023-04-10 ENCOUNTER — Encounter: Payer: Self-pay | Admitting: Family

## 2023-04-10 ENCOUNTER — Ambulatory Visit: Payer: BC Managed Care – PPO | Admitting: Family

## 2023-04-10 VITALS — BP 110/80 | HR 56 | Ht 68.0 in | Wt 209.0 lb

## 2023-04-10 DIAGNOSIS — R5383 Other fatigue: Secondary | ICD-10-CM

## 2023-04-10 DIAGNOSIS — E782 Mixed hyperlipidemia: Secondary | ICD-10-CM

## 2023-04-10 DIAGNOSIS — E559 Vitamin D deficiency, unspecified: Secondary | ICD-10-CM

## 2023-04-10 DIAGNOSIS — I10 Essential (primary) hypertension: Secondary | ICD-10-CM | POA: Diagnosis not present

## 2023-04-10 DIAGNOSIS — R300 Dysuria: Secondary | ICD-10-CM | POA: Diagnosis not present

## 2023-04-10 DIAGNOSIS — N760 Acute vaginitis: Secondary | ICD-10-CM

## 2023-04-10 DIAGNOSIS — E1165 Type 2 diabetes mellitus with hyperglycemia: Secondary | ICD-10-CM | POA: Diagnosis not present

## 2023-04-10 DIAGNOSIS — Z6831 Body mass index (BMI) 31.0-31.9, adult: Secondary | ICD-10-CM

## 2023-04-10 DIAGNOSIS — E538 Deficiency of other specified B group vitamins: Secondary | ICD-10-CM | POA: Diagnosis not present

## 2023-04-10 DIAGNOSIS — R1033 Periumbilical pain: Secondary | ICD-10-CM

## 2023-04-10 DIAGNOSIS — R42 Dizziness and giddiness: Secondary | ICD-10-CM

## 2023-04-10 LAB — POCT URINALYSIS DIPSTICK
Bilirubin, UA: NEGATIVE
Blood, UA: NEGATIVE
Glucose, UA: NEGATIVE
Ketones, UA: NEGATIVE
Leukocytes, UA: NEGATIVE
Nitrite, UA: NEGATIVE
Protein, UA: POSITIVE — AB
Spec Grav, UA: 1.025 (ref 1.010–1.025)
Urobilinogen, UA: 0.2 U/dL
pH, UA: 6 (ref 5.0–8.0)

## 2023-04-10 LAB — POCT CBG (FASTING - GLUCOSE)-MANUAL ENTRY: Glucose Fasting, POC: 95 mg/dL (ref 70–99)

## 2023-04-10 NOTE — Progress Notes (Signed)
Established Patient Office Visit  Subjective:  Patient ID: Penny Gonzalez, female    DOB: 02/09/1996  Age: 27 y.o. MRN: [id number]  Chief Complaint  Patient presents with   Follow-up    Hernia A1C check    Patient is here with a few concerns today:  1) Dizzy when she woke up the other day.  Has been having headaches.   2) When she was pregnant, she found out she has a hernia.  Needs repair.   Due for labs today Needs refills.    No other concerns at this time.   Past Medical History:  Diagnosis Date   Anemia, iron deficiency, inadequate dietary intake 12/01/2014   Depression    patient has not been officially diagnosed but has symptoms   Diabetes mellitus without complication (HCC)    Recently changed to Glyburide and thinks it is helping   Essential hypertension, benign 03/31/2016   Fetal macrosomia affecting management of mother, antepartum 10/16/2022   History of pre-eclampsia in prior pregnancy, currently pregnant 05/11/2022   History of preterm delivery, currently pregnant 05/11/2022   Irregular periods/menstrual cycles    Joint pain of leg    Major depression 11/23/2015   Obesity in pregnancy 11/29/2017   Obesity in pregnancy, antepartum 04/22/2022   Positive GBS test 10/06/2022   Pre-existing diabetes mellitus during pregnancy in third trimester 06/24/2014   Pre-existing diabetes mellitus in pregnancy 10/15/2022   Pre-existing essential hypertension during pregnancy in third trimester 09/30/2022   Preexisting diabetes complicating pregnancy, antepartum 07/27/2022   Pregnancy with type 2 diabetes mellitus in first trimester 04/22/2022   Sciatica    Supervision of normal pregnancy 04/19/2022   26 y.o. [id number], at [redacted]w[redacted]d based on LMP of 01/29/22, with an Estimated Date of Delivery: 11/05/22.     Sex of baby and name:  " "   Partner:    Ethelene Browns     Factors complicating this pregnancy   Deliveries at 69 week x2     Obesity BMI:   Baseline labs:  Early 1 hour  GTT: n/a  P/C ratio:  79  CMP: wnl  A1c: 6.5     H/o Preeclampsia,   ASA at 12 weeks (d/c 35-37 weeks) - discussed 04/21/22     Diabetes     Past Surgical History:  Procedure Laterality Date   CHOLECYSTECTOMY N/A 05/19/2016   Procedure: LAPAROSCOPIC CHOLECYSTECTOMY WITH INTRAOPERATIVE CHOLANGIOGRAM;  Surgeon: Nadeen Landau, MD;  Location: [facility] ORS;  Service: General;  Laterality: N/A;    Social History   Socioeconomic History   Marital status: Single    Spouse name: Not on file   Number of children: 1   Years of education: 13   Highest education level: High school graduate  Occupational History   Occupation: [employer]  Tobacco Use   Smoking status: Never    Passive exposure: Never   Smokeless tobacco: Never  Vaping Use   Vaping status: Never Used  Substance and Sexual Activity   Alcohol use: Not Currently    Comment: OCC   Drug use: No   Sexual activity: Yes    Partners: Male    Birth control/protection: Surgical    Comment: tubal  Other Topics Concern   Not on file  Social History Narrative   Not on file   Social Drivers of Health   Financial Resource Strain: Low Risk  (04/28/2022)   Overall Financial Resource Strain (CARDIA)    Difficulty of Paying Living Expenses: Not hard at  all  Food Insecurity: No Food Insecurity (10/15/2022)   Hunger Vital Sign    Worried About Running Out of Food in the Last Year: Never true    Ran Out of Food in the Last Year: Never true  Transportation Needs: No Transportation Needs (10/15/2022)   PRAPARE - Administrator, Civil Service (Medical): No    Lack of Transportation (Non-Medical): No  Physical Activity: Inactive (04/28/2022)   Exercise Vital Sign    Days of Exercise per Week: 0 days    Minutes of Exercise per Session: 0 min  Stress: No Stress Concern Present (04/28/2022)   [employer] of Occupational Health - Occupational Stress Questionnaire    Feeling of Stress : Only a little  Social Connections:  Moderately Integrated (04/28/2022)   Social Connection and Isolation Panel [NHANES]    Frequency of Communication with Friends and Family: More than three times a week    Frequency of Social Gatherings with Friends and Family: Once a week    Attends Religious Services: 1 to 4 times per year    Active Member of [employer] or Organizations: No    Attends Banker Meetings: Never    Marital Status: Living with partner  Intimate Partner Violence: Unknown (10/15/2022)   Humiliation, Afraid, Rape, and Kick questionnaire    Fear of Current or Ex-Partner: No    Emotionally Abused: No    Physically Abused: Not on file    Sexually Abused: Not on file    Family History  Problem Relation Age of Onset   Diabetes Mother    Arthritis Father    Diabetes Father    Heart disease Father    Hypertension Father    Stroke Father    Lupus Sister    Deafness Sister    Diabetes Maternal Grandmother    Hypertension Maternal Grandmother    Heart disease Paternal Grandmother    Hypertension Paternal Grandmother     Allergies  Allergen Reactions   Latex Rash    Pruritic, urticaria    Review of Systems  Constitutional:  Positive for malaise/fatigue.  Neurological:  Positive for dizziness and headaches.  All other systems reviewed and are negative.      Objective:   BP 110/80   Pulse (!) 56   Ht 5\' 8"  (1.727 m)   Wt 209 lb (94.8 kg)   SpO2 98%   BMI 31.78 kg/m   Vitals:   04/10/23 1517  BP: 110/80  Pulse: (!) 56  Height: 5\' 8"  (1.727 m)  Weight: 209 lb (94.8 kg)  SpO2: 98%  BMI (Calculated): 31.79    Physical Exam Vitals and nursing note reviewed.  Constitutional:      Appearance: Normal appearance. She is overweight.  HENT:     Head: Normocephalic.  Eyes:     Extraocular Movements: Extraocular movements intact.     Conjunctiva/sclera: Conjunctivae normal.     Pupils: Pupils are equal, round, and reactive to light.  Cardiovascular:     Rate and Rhythm: Normal rate.   Pulmonary:     Effort: Pulmonary effort is normal.  Musculoskeletal:        General: Normal range of motion.  Neurological:     General: No focal deficit present.     Mental Status: She is alert and oriented to person, place, and time. Mental status is at baseline.  Psychiatric:        Mood and Affect: Mood normal.  Behavior: Behavior normal.        Thought Content: Thought content normal.        Judgment: Judgment normal.      Results for orders placed or performed in visit on 04/10/23  Lipid panel  Result Value Ref Range   Cholesterol, Total 167 100 - 199 mg/dL   Triglycerides 563 0 - 149 mg/dL   HDL 60 >87 mg/dL   VLDL Cholesterol Cal 21 5 - 40 mg/dL   LDL Chol Calc (NIH) 86 0 - 99 mg/dL   Chol/HDL Ratio 2.8 0.0 - 4.4 ratio  VITAMIN D 25 Hydroxy (Vit-D Deficiency, Fractures)  Result Value Ref Range   Vit D, 25-Hydroxy 16.1 (L) 30.0 - 100.0 ng/mL  CMP14+EGFR  Result Value Ref Range   Glucose 76 70 - 99 mg/dL   BUN 9 6 - 20 mg/dL   Creatinine, Ser 5.64 0.57 - 1.00 mg/dL   eGFR 332 >95 JO/ACZ/6.60   BUN/Creatinine Ratio 14 9 - 23   Sodium 142 134 - 144 mmol/L   Potassium 4.1 3.5 - 5.2 mmol/L   Chloride 100 96 - 106 mmol/L   CO2 25 20 - 29 mmol/L   Calcium 9.5 8.7 - 10.2 mg/dL   Total Protein 6.7 6.0 - 8.5 g/dL   Albumin 4.6 4.0 - 5.0 g/dL   Globulin, Total 2.1 1.5 - 4.5 g/dL   Bilirubin Total 0.3 0.0 - 1.2 mg/dL   Alkaline Phosphatase 117 44 - 121 IU/L   AST 15 0 - 40 IU/L   ALT 13 0 - 32 IU/L  TSH  Result Value Ref Range   TSH 0.634 0.450 - 4.500 uIU/mL  Hemoglobin A1c  Result Value Ref Range   Hgb A1c MFr Bld 5.6 4.8 - 5.6 %   Est. average glucose Bld gHb Est-mCnc 114 mg/dL  Vitamin Y30  Result Value Ref Range   Vitamin B-12 593 232 - 1,245 pg/mL  CBC with Diff  Result Value Ref Range   WBC 4.8 3.4 - 10.8 x10E3/uL   RBC 4.60 3.77 - 5.28 x10E6/uL   Hemoglobin 12.8 11.1 - 15.9 g/dL   Hematocrit 16.0 10.9 - 46.6 %   MCV 87 79 - 97 fL   MCH 27.8  26.6 - 33.0 pg   MCHC 32.2 31.5 - 35.7 g/dL   RDW 32.3 55.7 - 32.2 %   Platelets 286 150 - 450 x10E3/uL   Neutrophils 45 Not Estab. %   Lymphs 48 Not Estab. %   Monocytes 5 Not Estab. %   Eos 2 Not Estab. %   Basos 0 Not Estab. %   Neutrophils Absolute 2.2 1.4 - 7.0 x10E3/uL   Lymphocytes Absolute 2.3 0.7 - 3.1 x10E3/uL   Monocytes Absolute 0.2 0.1 - 0.9 x10E3/uL   EOS (ABSOLUTE) 0.1 0.0 - 0.4 x10E3/uL   Basophils Absolute 0.0 0.0 - 0.2 x10E3/uL   Immature Granulocytes 0 Not Estab. %   Immature Grans (Abs) 0.0 0.0 - 0.1 x10E3/uL  Iron, TIBC and Ferritin Panel  Result Value Ref Range   Total Iron Binding Capacity 423 250 - 450 ug/dL   UIBC 025 427 - 062 ug/dL   Iron 43 27 - 376 ug/dL   Iron Saturation 10 (L) 15 - 55 %   Ferritin 13 (L) 15 - 150 ng/mL  NuSwab Vaginitis Plus (VG+)  Result Value Ref Range   Atopobium vaginae Low - 0 Score   BVAB 2 Low - 0 Score   Megasphaera 1 Low -  0 Score   Candida albicans, NAA Negative Negative   Candida glabrata, NAA Negative Negative   Trich vag by NAA Negative Negative   Chlamydia trachomatis, NAA Negative Negative   Neisseria gonorrhoeae, NAA Negative Negative  POCT CBG (Fasting - Glucose)  Result Value Ref Range   Glucose Fasting, POC 95 70 - 99 mg/dL  POCT Urinalysis Dipstick (81002)  Result Value Ref Range   Color, UA dark yellow    Clarity, UA cloudy    Glucose, UA Negative Negative   Bilirubin, UA neg    Ketones, UA neg    Spec Grav, UA 1.025 1.010 - 1.025   Blood, UA neg    pH, UA 6.0 5.0 - 8.0   Protein, UA Positive (A) Negative   Urobilinogen, UA 0.2 0.2 or 1.0 E.U./dL   Nitrite, UA neg    Leukocytes, UA Negative Negative   Appearance cloudy    Odor yes     Recent Results (from the past 2160 hours)  POCT CBG (Fasting - Glucose)     Status: None   Collection Time: 04/10/23  3:21 PM  Result Value Ref Range   Glucose Fasting, POC 95 70 - 99 mg/dL  Lipid panel     Status: None   Collection Time: 04/10/23  3:51 PM   Result Value Ref Range   Cholesterol, Total 167 100 - 199 mg/dL   Triglycerides 540 0 - 149 mg/dL   HDL 60 >98 mg/dL   VLDL Cholesterol Cal 21 5 - 40 mg/dL   LDL Chol Calc (NIH) 86 0 - 99 mg/dL   Chol/HDL Ratio 2.8 0.0 - 4.4 ratio    Comment:                                   T. Chol/HDL Ratio                                             Men  Women                               1/2 Avg.Risk  3.4    3.3                                   Avg.Risk  5.0    4.4                                2X Avg.Risk  9.6    7.1                                3X Avg.Risk 23.4   11.0   VITAMIN D 25 Hydroxy (Vit-D Deficiency, Fractures)     Status: Abnormal   Collection Time: 04/10/23  3:51 PM  Result Value Ref Range   Vit D, 25-Hydroxy 16.1 (L) 30.0 - 100.0 ng/mL    Comment: Vitamin D deficiency has been defined by the Institute of Medicine and an Endocrine Society practice guideline as a level of serum 25-OH vitamin D less than 20 ng/mL (1,2). The Endocrine  Society went on to further define vitamin D insufficiency as a level between 21 and 29 ng/mL (2). 1. IOM (Institute of Medicine). 2010. Dietary reference    intakes for calcium and D. [place]: The    [employer]. 2. Holick MF, Binkley Neeses, Bischoff-Ferrari HA, et al.    Evaluation, treatment, and prevention of vitamin D    deficiency: an Endocrine Society clinical practice    guideline. JCEM. 2011 Jul; 96(7):1911-30.   CMP14+EGFR     Status: None   Collection Time: 04/10/23  3:51 PM  Result Value Ref Range   Glucose 76 70 - 99 mg/dL   BUN 9 6 - 20 mg/dL   Creatinine, Ser 1.61 0.57 - 1.00 mg/dL   eGFR 096 >04 VW/UJW/1.19   BUN/Creatinine Ratio 14 9 - 23   Sodium 142 134 - 144 mmol/L   Potassium 4.1 3.5 - 5.2 mmol/L   Chloride 100 96 - 106 mmol/L   CO2 25 20 - 29 mmol/L   Calcium 9.5 8.7 - 10.2 mg/dL   Total Protein 6.7 6.0 - 8.5 g/dL   Albumin 4.6 4.0 - 5.0 g/dL   Globulin, Total 2.1 1.5 - 4.5 g/dL   Bilirubin Total  0.3 0.0 - 1.2 mg/dL   Alkaline Phosphatase 117 44 - 121 IU/L   AST 15 0 - 40 IU/L   ALT 13 0 - 32 IU/L  TSH     Status: None   Collection Time: 04/10/23  3:51 PM  Result Value Ref Range   TSH 0.634 0.450 - 4.500 uIU/mL  Hemoglobin A1c     Status: None   Collection Time: 04/10/23  3:51 PM  Result Value Ref Range   Hgb A1c MFr Bld 5.6 4.8 - 5.6 %    Comment:          Prediabetes: 5.7 - 6.4          Diabetes: >6.4          Glycemic control for adults with diabetes: <7.0    Est. average glucose Bld gHb Est-mCnc 114 mg/dL  Vitamin J47     Status: None   Collection Time: 04/10/23  3:51 PM  Result Value Ref Range   Vitamin B-12 593 232 - 1,245 pg/mL  CBC with Diff     Status: None   Collection Time: 04/10/23  3:51 PM  Result Value Ref Range   WBC 4.8 3.4 - 10.8 x10E3/uL   RBC 4.60 3.77 - 5.28 x10E6/uL   Hemoglobin 12.8 11.1 - 15.9 g/dL   Hematocrit 82.9 56.2 - 46.6 %   MCV 87 79 - 97 fL   MCH 27.8 26.6 - 33.0 pg   MCHC 32.2 31.5 - 35.7 g/dL   RDW 13.0 86.5 - 78.4 %   Platelets 286 150 - 450 x10E3/uL   Neutrophils 45 Not Estab. %   Lymphs 48 Not Estab. %   Monocytes 5 Not Estab. %   Eos 2 Not Estab. %   Basos 0 Not Estab. %   Neutrophils Absolute 2.2 1.4 - 7.0 x10E3/uL   Lymphocytes Absolute 2.3 0.7 - 3.1 x10E3/uL   Monocytes Absolute 0.2 0.1 - 0.9 x10E3/uL   EOS (ABSOLUTE) 0.1 0.0 - 0.4 x10E3/uL   Basophils Absolute 0.0 0.0 - 0.2 x10E3/uL   Immature Granulocytes 0 Not Estab. %   Immature Grans (Abs) 0.0 0.0 - 0.1 x10E3/uL  Iron, TIBC and Ferritin Panel     Status: Abnormal   Collection Time: 04/10/23  3:51 PM  Result Value Ref Range   Total Iron Binding Capacity 423 250 - 450 ug/dL   UIBC 161 096 - 045 ug/dL   Iron 43 27 - 409 ug/dL   Iron Saturation 10 (L) 15 - 55 %   Ferritin 13 (L) 15 - 150 ng/mL  POCT Urinalysis Dipstick (81191)     Status: Abnormal   Collection Time: 04/10/23  3:55 PM  Result Value Ref Range   Color, UA dark yellow    Clarity, UA cloudy     Glucose, UA Negative Negative   Bilirubin, UA neg    Ketones, UA neg    Spec Grav, UA 1.025 1.010 - 1.025   Blood, UA neg    pH, UA 6.0 5.0 - 8.0   Protein, UA Positive (A) Negative   Urobilinogen, UA 0.2 0.2 or 1.0 E.U./dL   Nitrite, UA neg    Leukocytes, UA Negative Negative   Appearance cloudy    Odor yes   NuSwab Vaginitis Plus (VG+)     Status: None   Collection Time: 04/10/23  3:59 PM  Result Value Ref Range   Atopobium vaginae Low - 0 Score   BVAB 2 Low - 0 Score   Megasphaera 1 Low - 0 Score    Comment: Calculate total score by adding the 3 individual bacterial vaginosis (BV) marker scores together.  Total score is interpreted as follows: Total score 0-1: Indicates the absence of BV. Total score   2: Indeterminate for BV. Additional clinical                  data should be evaluated to establish a                  diagnosis. Total score 3-6: Indicates the presence of BV.    Candida albicans, NAA Negative Negative   Candida glabrata, NAA Negative Negative   Trich vag by NAA Negative Negative   Chlamydia trachomatis, NAA Negative Negative   Neisseria gonorrhoeae, NAA Negative Negative       Assessment & Plan:   Problem List Items Addressed This Visit       Active Problems   Type 2 diabetes mellitus with hyperglycemia, without long-term current use of insulin (HCC) - Primary   Checking labs today. Will call pt. With results  Continue current diabetes POC, as patient has been well controlled on current regimen.  Will adjust meds if needed based on labs.       Relevant Orders   POCT CBG (Fasting - Glucose) (Completed)   CMP14+EGFR (Completed)   Hemoglobin A1c (Completed)   CBC with Diff (Completed)   Other fatigue   Relevant Orders   CMP14+EGFR (Completed)   TSH (Completed)   CBC with Diff (Completed)   Vitamin D deficiency   Checking labs today.  Will continue supplements as needed.        Relevant Orders   VITAMIN D 25 Hydroxy (Vit-D Deficiency,  Fractures) (Completed)   CMP14+EGFR (Completed)   CBC with Diff (Completed)   Essential hypertension, benign (Chronic)   Blood pressure well controlled with current medications.  Continue current therapy.  Will reassess at follow up.       Relevant Orders   CMP14+EGFR (Completed)   CBC with Diff (Completed)   B12 deficiency due to diet   Checking labs today.  Will continue supplements as needed.       Relevant Orders   CMP14+EGFR (Completed)   Vitamin B12 (Completed)  CBC with Diff (Completed)   Umbilical pain   Will set up for referral to general surgery for pt.      BMI 31.0-31.9,adult   Continue current meds.  Will adjust as needed based on results.  The patient is asked to make an attempt to improve diet and exercise patterns to aid in medical management of this problem. Addressed importance of increasing and maintaining water intake.        Relevant Orders   CMP14+EGFR (Completed)   CBC with Diff (Completed)   Other Visit Diagnoses       Dysuria       UA in office today WNL.  Will recheck at follow up.   Relevant Orders   CMP14+EGFR (Completed)   CBC with Diff (Completed)   POCT Urinalysis Dipstick (65784) (Completed)     Mixed hyperlipidemia       Relevant Orders   Lipid panel (Completed)   CMP14+EGFR (Completed)   CBC with Diff (Completed)     Dizziness and giddiness       Checking labs today Will call with results.   Relevant Orders   CMP14+EGFR (Completed)   CBC with Diff (Completed)   Iron, TIBC and Ferritin Panel (Completed)     Acute vaginitis       NuSwab sent today.  Will call with results.   Relevant Orders   NuSwab Vaginitis Plus (VG+) (Completed)       Return in about 3 months (around 07/11/2023).   Total time spent: 20 minutes  Miki Kins, FNP  04/10/2023   This document may have been prepared by [facility] Voice Recognition software and as such may include unintentional dictation errors.

## 2023-04-11 ENCOUNTER — Telehealth: Payer: Self-pay

## 2023-04-11 LAB — CBC WITH DIFFERENTIAL/PLATELET
Basophils Absolute: 0 10*3/uL (ref 0.0–0.2)
Basos: 0 %
EOS (ABSOLUTE): 0.1 10*3/uL (ref 0.0–0.4)
Eos: 2 %
Hematocrit: 39.8 % (ref 34.0–46.6)
Hemoglobin: 12.8 g/dL (ref 11.1–15.9)
Immature Grans (Abs): 0 10*3/uL (ref 0.0–0.1)
Immature Granulocytes: 0 %
Lymphocytes Absolute: 2.3 10*3/uL (ref 0.7–3.1)
Lymphs: 48 %
MCH: 27.8 pg (ref 26.6–33.0)
MCHC: 32.2 g/dL (ref 31.5–35.7)
MCV: 87 fL (ref 79–97)
Monocytes Absolute: 0.2 10*3/uL (ref 0.1–0.9)
Monocytes: 5 %
Neutrophils Absolute: 2.2 10*3/uL (ref 1.4–7.0)
Neutrophils: 45 %
Platelets: 286 10*3/uL (ref 150–450)
RBC: 4.6 x10E6/uL (ref 3.77–5.28)
RDW: 13.4 % (ref 11.7–15.4)
WBC: 4.8 10*3/uL (ref 3.4–10.8)

## 2023-04-11 LAB — IRON,TIBC AND FERRITIN PANEL
Ferritin: 13 ng/mL — ABNORMAL LOW (ref 15–150)
Iron Saturation: 10 % — ABNORMAL LOW (ref 15–55)
Iron: 43 ug/dL (ref 27–159)
Total Iron Binding Capacity: 423 ug/dL (ref 250–450)
UIBC: 380 ug/dL (ref 131–425)

## 2023-04-11 LAB — HEMOGLOBIN A1C
Est. average glucose Bld gHb Est-mCnc: 114 mg/dL
Hgb A1c MFr Bld: 5.6 % (ref 4.8–5.6)

## 2023-04-11 LAB — LIPID PANEL
Chol/HDL Ratio: 2.8 ratio (ref 0.0–4.4)
Cholesterol, Total: 167 mg/dL (ref 100–199)
HDL: 60 mg/dL (ref >39)
LDL Chol Calc (NIH): 86 mg/dL (ref 0–99)
Triglycerides: 122 mg/dL (ref 0–149)
VLDL Cholesterol Cal: 21 mg/dL (ref 5–40)

## 2023-04-11 LAB — VITAMIN B12: Vitamin B-12: 593 pg/mL (ref 232–1245)

## 2023-04-11 LAB — CMP14+EGFR
ALT: 13 [IU]/L (ref 0–32)
AST: 15 [IU]/L (ref 0–40)
Albumin: 4.6 g/dL (ref 4.0–5.0)
Alkaline Phosphatase: 117 [IU]/L (ref 44–121)
BUN/Creatinine Ratio: 14 (ref 9–23)
BUN: 9 mg/dL (ref 6–20)
Bilirubin Total: 0.3 mg/dL (ref 0.0–1.2)
CO2: 25 mmol/L (ref 20–29)
Calcium: 9.5 mg/dL (ref 8.7–10.2)
Chloride: 100 mmol/L (ref 96–106)
Creatinine, Ser: 0.63 mg/dL (ref 0.57–1.00)
Globulin, Total: 2.1 g/dL (ref 1.5–4.5)
Glucose: 76 mg/dL (ref 70–99)
Potassium: 4.1 mmol/L (ref 3.5–5.2)
Sodium: 142 mmol/L (ref 134–144)
Total Protein: 6.7 g/dL (ref 6.0–8.5)
eGFR: 123 mL/min/{1.73_m2} (ref >59)

## 2023-04-11 LAB — TSH: TSH: 0.634 u[IU]/mL (ref 0.450–4.500)

## 2023-04-11 LAB — VITAMIN D 25 HYDROXY (VIT D DEFICIENCY, FRACTURES): Vit D, 25-Hydroxy: 16.1 ng/mL — ABNORMAL LOW (ref 30.0–100.0)

## 2023-04-11 NOTE — Telephone Encounter (Signed)
Pt called wanting to talk with someone about the issues she saw her dr for yesterday; they explained to her she needed a cauterization of the hernia and to talk to [place] about that and having her tubes ties ?at the same time?.  [phone]  Pt states she saw her PCP yesterday for a routine check up - A1c, and all that.  It was explained to her why she needed to have the hernia she had during preg cauterized.  It is located beside her belly button.  Was adv to call us for this as well as tubal.  Adv pt she needed to see one of our surgeons for a consult about hernia and tubal.  Adv will send msg to schedulers to get her scheduled and to watch for their call.

## 2023-04-12 LAB — NUSWAB VAGINITIS PLUS (VG+)
Candida albicans, NAA: NEGATIVE
Candida glabrata, NAA: NEGATIVE
Chlamydia trachomatis, NAA: NEGATIVE
Neisseria gonorrhoeae, NAA: NEGATIVE
Trich vag by NAA: NEGATIVE

## 2023-04-20 ENCOUNTER — Ambulatory Visit (INDEPENDENT_AMBULATORY_CARE_PROVIDER_SITE_OTHER): Payer: BC Managed Care – PPO | Admitting: Clinical

## 2023-04-20 ENCOUNTER — Encounter (HOSPITAL_COMMUNITY): Payer: Self-pay | Admitting: Clinical

## 2023-04-20 DIAGNOSIS — F53 Postpartum depression: Secondary | ICD-10-CM | POA: Diagnosis not present

## 2023-04-20 DIAGNOSIS — Z630 Problems in relationship with spouse or partner: Secondary | ICD-10-CM

## 2023-04-20 DIAGNOSIS — F419 Anxiety disorder, unspecified: Secondary | ICD-10-CM | POA: Diagnosis not present

## 2023-04-20 NOTE — Progress Notes (Signed)
THERAPIST PROGRESS NOTE  Session Time: 8:06-9:02am  Session #7  Participation Level: Active  Behavioral Response: Well Groomed Alert Negative  Type of Therapy: Individual Therapy  Treatment Goals addressed:  LTG: Penny Gonzalez will score less than 5 on the Generalized Anxiety Disorder 7 Scale (GAD-7) STG: Penny Gonzalez will complete at least 80% of assigned homework STG: Penny Gonzalez will practice problem solving skills 3 times per week for the next 4 weeks. STG: Penny Gonzalez will reduce frequency of avoidant behaviors by 50% as evidenced by self-report in therapy sessions LTG: Increase coping skills to manage depression and improve ability to perform daily activities LTG: Penny Gonzalez will score less than 9 on the Patient Health Questionnaire (PHQ-9) STG: Penny Gonzalez will participate in at least 80% of scheduled individual psychotherapy sessions STG: Penny Gonzalez will identify cognitive patterns and beliefs that support depression  ProgressTowards Goals: Progressing  Interventions: Supportive and Other: Processing and Planning regarding break-up of relationship    Summary: Penny Gonzalez is a 27 y.o. female who presents with post-partum depression and anxiety for therapy.  She presented oriented x5 and stated she was feeling "like I've been on a roller coaster the last week."  CSW evaluated patient's medication compliance, use of coping tools, and self-care, as applicable.  She shared a lengthy description of what has happened between her and her partner as she has dealt with making preparations to break up with him and have him leave the house.  Some of this involved some frightening behavior that was aggressive and threatening but did not harm her.  He is trying to get her to have a conversation about conserving the relationship but she is not willing to do so, is not answering his requests for this.  It appeared to be beneficial for her to review some of their history, how they got together, and previous  issues he has had with others he has been in relationship with.  CSW provided support and encouragement for the limits she is setting on what is acceptable as this break-up occurs.  CSW continued to emphasize thinking about what she can control and cannot control as well as doing a lot of deep breathing.  CSW also was shown a text message she received after the election that was later found on the news as an event that is happening broadly, with African-American persons being told they have been selected to report to a particular plantation to pick cotton.  This was highly disturbing to her and brought back memories of racial slurs and injustices she has been through in her lifetime.  She stated she has never encountered prejudice before coming to [place] and shared some horrible stories that must have been terribly painful.  CSW encouraged her to either go to the police or the media with this information about the text message.  CSW provided her with the safest place possible to vent her frustrations and fears.    Suicidal/Homicidal: No without intent/plan  Therapist Response: Patient is progressing AEB engaging in scheduled therapy session.   Throughout the session, CSW gave patient the opportunity to explore thoughts and feelings associated with current life situations and past/present stressors.   CSW challenged patient gently and appropriately to consider different ways of looking at reported issues. CSW encouraged patient's expression of feelings and validated these using empathy, active listening, open body language, and unconditional positive regard.  CSW encouraged patient to schedule more therapy sessions for the future, as only one remains currently.      Plan: Return again  in 2 weeks  Next appointment: 11/21  Recommendations:  Return to therapy in 2 weeks, engage in self care behaviors, think about boundaries as she handles issues with partner, breathing exercises  Diagnosis:   Postpartum depression associated with second pregnancy  Anxiety disorder, unspecified type  Relationship problem between partners  Collaboration of Care: Primary Care Provider AEB - can see that patient is in therapy  Patient/Guardian was advised Release of Information must be obtained prior to any record release in order to collaborate their care with an outside provider. Patient/Guardian was advised if they have not already done so to contact the registration department to sign all necessary forms in order for [place] to release information regarding their care.   Consent: Patient/Guardian gives verbal consent for treatment and assignment of benefits for services provided during this visit. Patient/Guardian expressed understanding and agreed to proceed.   Lynnell Chad, LCSW 04/20/2023

## 2023-04-25 ENCOUNTER — Telehealth: Payer: Self-pay

## 2023-04-25 DIAGNOSIS — R1033 Periumbilical pain: Secondary | ICD-10-CM

## 2023-04-25 NOTE — Telephone Encounter (Signed)
Pt calling; forgot who she was told to call to work out coordinating hernia surgery c tubal.  Adv [facility].

## 2023-04-26 NOTE — Telephone Encounter (Signed)
Pt calling; tried setting up a consultation appt c [facility]; was told we needed to send them a referral.  Adv we would.  [phone]

## 2023-05-02 ENCOUNTER — Ambulatory Visit (INDEPENDENT_AMBULATORY_CARE_PROVIDER_SITE_OTHER): Payer: BC Managed Care – PPO | Admitting: Obstetrics and Gynecology

## 2023-05-02 ENCOUNTER — Encounter: Payer: Self-pay | Admitting: Obstetrics and Gynecology

## 2023-05-02 VITALS — BP 125/80 | HR 84 | Resp 16 | Ht 68.0 in | Wt 215.5 lb

## 2023-05-02 DIAGNOSIS — Z3009 Encounter for other general counseling and advice on contraception: Secondary | ICD-10-CM | POA: Diagnosis not present

## 2023-05-02 DIAGNOSIS — K469 Unspecified abdominal hernia without obstruction or gangrene: Secondary | ICD-10-CM

## 2023-05-02 NOTE — Progress Notes (Signed)
    GYNECOLOGY PROGRESS NOTE  Subjective:    Patient ID: Penny Gonzalez, female    DOB: 03-03-1996, 27 y.o.   MRN: [id number]  HPI  Patient is a 27 y.o. [id number] female who presents for discussion for tubal ligation.  Previously desired sterilization with last pregnancy 6 months ago, however due to complications postpartum, decided to hold of on procedure.  Patient notes that she is now ready to proceed.  Also would like to discuss possibility of a joint surgery for her hernia.  Has noted the hernia since her last pregnancy and would like to have this repaired as well.  Is looking to potentially have surgery in mid-January (week of January 20th) when she has more time off from her job.  The following portions of the patient's history were reviewed and updated as appropriate: allergies, current medications, past family history, past medical history, past social history, past surgical history, and problem list.  Review of Systems Pertinent items noted in HPI and remainder of comprehensive ROS otherwise negative.   Objective:   Blood pressure 125/80, pulse 84, resp. rate 16, height 5\' 8"  (1.727 m), weight 215 lb 8 oz (97.8 kg), currently breastfeeding.  Body mass index is 32.77 kg/m. General appearance: alert and no distress Abdomen: soft, non-tender; bowel sounds normal; no masses,  no organomegaly. Small ventral hernia noted at right abdomen, reducible, no incarceration noted.     Assessment:   1. Unwanted fertility   2. Hernia of abdominal cavity      Plan:   1. Unwanted fertility Patient desires permanent sterilization.  Other reversible forms of contraception were again reviewed with patient; she declines all other modalities. Risks of procedure discussed with patient including but not limited to: risk of regret, permanence of method, bleeding, infection, injury to surrounding organs and need for additional procedures.  Failure risk of about 1% with increased risk of ectopic  gestation if pregnancy occurs was also discussed with patient.  Also discussed possibility of post-tubal pain syndrome. Patient verbalized understanding of these risks and wants to proceed with sterilization.   Will work to coordinate joint surgery with General Surgery in mid January if they agree hernia is able to be surgically repaired.    2. Hernia of abdominal cavity - Ambulatory referral to General Surgery   RTC in early January for pre-op visit.   A total of 24 minutes were spent face-to-face with the patient during this encounter and over half of that time involved counseling and coordination of care.   Hildred Laser, MD [facility] at [facility]

## 2023-05-02 NOTE — Progress Notes (Unsigned)
Patient ID: Penny Gonzalez, female   DOB: [birth year], 27 y.o.   MRN: [id number]  Chief Complaint: Desires tubal ligation, and same anesthetic for ventral hernia repair  History of Present Illness Penny Gonzalez is a 27 y.o. female with a history of the development of a palpable abnormality to the right of her umbilical area during her last pregnancy.  Bothered her somewhat during that pregnancy , and became more painful/troublesome with lifting her newborn with the car seat.  She notes there is a palpable area to the right of her umbilical area where likely there was an incision for her gallbladder surgery.  She does not know of a persistent bulge or mass in the area.  However she is anticipating surgery, for a tubal ligation and would like to combine procedures in the same event.  This will likely happen early January 2025.  Past Medical History Past Medical History:  Diagnosis Date   Anemia, iron deficiency, inadequate dietary intake 12/01/2014   Depression    patient has not been officially diagnosed but has symptoms   Diabetes mellitus without complication (HCC)    Recently changed to Glyburide and thinks it is helping   Essential hypertension, benign 03/31/2016   Fetal macrosomia affecting management of mother, antepartum 10/16/2022   History of pre-eclampsia in prior pregnancy, currently pregnant 05/11/2022   History of preterm delivery, currently pregnant 05/11/2022   Irregular periods/menstrual cycles    Joint pain of leg    Major depression 11/23/2015   Obesity in pregnancy 11/29/2017   Obesity in pregnancy, antepartum 04/22/2022   Positive GBS test 10/06/2022   Pre-existing diabetes mellitus during pregnancy in third trimester 06/24/2014   Pre-existing diabetes mellitus in pregnancy 10/15/2022   Pre-existing essential hypertension during pregnancy in third trimester 09/30/2022   Preexisting diabetes complicating pregnancy, antepartum 07/27/2022   Pregnancy with type 2  diabetes mellitus in first trimester 04/22/2022   Sciatica    Supervision of normal pregnancy 04/19/2022   26 y.o. [id number], at [redacted]w[redacted]d based on LMP of 01/29/22, with an Estimated Date of Delivery: 11/05/22.     Sex of baby and name:  " "   Partner:    Ethelene Browns     Factors complicating this pregnancy   Deliveries at 87 week x2     Obesity BMI:   Baseline labs:  Early 1 hour GTT: n/a  P/C ratio:  79  CMP: wnl  A1c: 6.5     H/o Preeclampsia,   ASA at 12 weeks (d/c 35-37 weeks) - discussed 04/21/22     Diabetes       Past Surgical History:  Procedure Laterality Date   CHOLECYSTECTOMY N/A 05/19/2016   Procedure: LAPAROSCOPIC CHOLECYSTECTOMY WITH INTRAOPERATIVE CHOLANGIOGRAM;  Surgeon: Nadeen Landau, MD;  Location: [facility] ORS;  Service: General;  Laterality: N/A;    Allergies  Allergen Reactions   Latex Rash    Pruritic, urticaria    No current outpatient medications on file.   No current facility-administered medications for this visit.    Family History Family History  Problem Relation Age of Onset   Diabetes Mother    Arthritis Father    Diabetes Father    Heart disease Father    Hypertension Father    Stroke Father    Lupus Sister    Deafness Sister    Diabetes Maternal Grandmother    Hypertension Maternal Grandmother    Heart disease Paternal Grandmother    Hypertension Paternal Grandmother  Social History Social History   Tobacco Use   Smoking status: Never   Smokeless tobacco: Never  Vaping Use   Vaping status: Never Used  Substance Use Topics   Alcohol use: Not Currently    Comment: OCC   Drug use: No        Review of Systems  Constitutional: Negative.   HENT: Negative.    Eyes: Negative.   Respiratory: Negative.    Cardiovascular: Negative.   Gastrointestinal:  Positive for abdominal pain and blood in stool.  Genitourinary: Negative.   Skin: Negative.   Neurological:  Positive for headaches.  Psychiatric/Behavioral: Negative.        Physical Exam Blood pressure 129/87, pulse 82, temperature 98.6 F (37 C), temperature source Oral, height 5\' 8"  (1.727 m), weight 211 lb 6.4 oz (95.9 kg), SpO2 100%, currently breastfeeding. Last Weight  Most recent update: 05/04/2023 10:26 AM    Weight  95.9 kg (211 lb 6.4 oz)             CONSTITUTIONAL: Well developed, and nourished, appropriately responsive and aware without distress.   EYES: Sclera non-icteric.   EARS, NOSE, MOUTH AND THROAT:  The oropharynx is clear. Oral mucosa is pink and moist.    Hearing is intact to voice.  NECK: Trachea is midline, and there is no jugular venous distension.  LYMPH NODES:  Lymph nodes in the neck are not appreciated. RESPIRATORY:  Lungs are clear, and breath sounds are equal bilaterally.  Normal respiratory effort without pathologic use of accessory muscles. CARDIOVASCULAR: Heart is regular in rate and rhythm.   Well perfused.  GI: The abdomen is consistent with postpartum abdominal wall laxity, some evidence of weight loss with soft tissue laxity as well.  This makes identifying a fascial defect somewhat challenging.  There is some palpable scarring near the the umbilical area, some potential of eventration, question surgical scarring versus fascial defect.  Remainder the abdomen is soft, nontender, and nondistended.   I did not appreciate hepatosplenomegaly.  MUSCULOSKELETAL:  Symmetrical muscle tone appreciated in all four extremities.    SKIN: Skin turgor is normal. No pathologic skin lesions appreciated.  NEUROLOGIC:  Motor and sensation appear grossly normal.  Cranial nerves are grossly without defect. PSYCH:  Alert and oriented to person, place and time. Affect is appropriate for situation.  Data Reviewed I have personally reviewed what is currently available of the patient's imaging, recent labs and medical records.   Labs:     Latest Ref Rng & Units 04/10/2023    3:51 PM 12/22/2022   10:18 AM 10/16/2022    6:46 AM  CBC  WBC  3.4 - 10.8 x10E3/uL 4.8  5.0  9.0   Hemoglobin 11.1 - 15.9 g/dL 65.7  84.6  96.2   Hematocrit 34.0 - 46.6 % 39.8  39.5  31.0   Platelets 150 - 450 x10E3/uL 286   183       Latest Ref Rng & Units 04/10/2023    3:51 PM 12/22/2022   10:18 AM 10/15/2022   12:52 AM  CMP  Glucose 70 - 99 mg/dL 76  79  952   BUN 6 - 20 mg/dL 9  5  9    Creatinine 0.57 - 1.00 mg/dL 8.41  3.24  4.01   Sodium 134 - 144 mmol/L 142  140  133   Potassium 3.5 - 5.2 mmol/L 4.1  3.9  4.3   Chloride 96 - 106 mmol/L 100  101  105   CO2  20 - 29 mmol/L 25  22  19    Calcium 8.7 - 10.2 mg/dL 9.5  9.2  9.0   Total Protein 6.0 - 8.5 g/dL 6.7  7.0    Total Bilirubin 0.0 - 1.2 mg/dL 0.3  0.5    Alkaline Phos 44 - 121 IU/L 117  155    AST 0 - 40 IU/L 15  15    ALT 0 - 32 IU/L 13  15        Imaging: Radiological images reviewed:   Within last 24 hrs: No results found.  Assessment    Possible incisional hernia in periumbilical area, difficult to readily appreciate fascial defect. Patient Active Problem List   Diagnosis Date Noted   B12 deficiency due to diet 12/31/2022   Unwanted fertility 07/27/2022   History of pre-eclampsia 04/22/2022   Acanthosis nigricans 06/27/2017   Fatty liver 05/07/2016   Essential hypertension, benign 03/31/2016   Proteinuria 01/30/2016   Vitamin D deficiency 11/19/2015   Other fatigue 11/18/2015   Type 2 diabetes mellitus with hyperglycemia, without long-term current use of insulin (HCC) 11/26/2014   Amenorrhea 11/26/2014    Plan    Will obtain abdominal/pelvic CT scan to evaluate for possible hernia in this area.  And once results obtained will attempt to coordinate/plan further steps with Dr. Oretha Milch office anticipating possible surgery at the turn of the year.   Face-to-face time spent with the patient and accompanying care providers(if present) was 30 minutes, with more than 50% of the time spent counseling, educating, and coordinating care of the patient.    These notes  generated with voice recognition software. I apologize for typographical errors.  Campbell Lerner M.D., FACS 05/04/2023, 10:48 AM

## 2023-05-02 NOTE — Patient Instructions (Addendum)
Laparoscopic Tubal Ligation Laparoscopic tubal ligation is a procedure to close the fallopian tubes. This is done to prevent pregnancy. When the fallopian tubes are closed, the eggs that your ovaries release cannot enter the uterus, and sperm cannot reach the released eggs. You should not have this procedure if you want to get pregnant someday or if you are unsure about having more children. Tell a health care provider about: Any allergies you have. All medicines you are taking, including vitamins, herbs, eye drops, creams, and over-the-counter medicines. Any problems you or family members have had with anesthetic medicines. Any blood disorders you have. Any surgeries you have had. Any medical conditions you have. Whether you are pregnant or may be pregnant. Any past pregnancies. What are the risks? Generally, this is a safe procedure. However, problems may occur, including: Infection. Bleeding. Injury to other organs in the abdomen. Side effects from anesthetic medicines. Failure of the procedure. This procedure can increase your risk of an ectopic pregnancy. This is a pregnancy in which a fertilized egg attaches to the outside of the uterus. What happens before the procedure? Staying hydrated Follow instructions from your health care provider about hydration, which may include: Up to 2 hours before the procedure - you may continue to drink clear liquids, such as water, clear fruit juice, black coffee, and plain tea. Eating and drinking restrictions Follow instructions from your health care provider about eating and drinking, which may include: 8 hours before the procedure - stop eating heavy meals or foods, such as meat, fried foods, or fatty foods. 6 hours before the procedure - stop eating light meals or foods, such as toast or cereal. 6 hours before the procedure - stop drinking milk or drinks that contain milk. 2 hours before the procedure - stop drinking clear  liquids. Medicines Ask your health care provider about: Changing or stopping your regular medicines. This is especially important if you are taking diabetes medicines or blood thinners. Taking medicines such as aspirin and ibuprofen. These medicines can thin your blood. Do not take these medicines unless your health care provider tells you to take them. Taking over-the-counter medicines, vitamins, herbs, and supplements. Surgery safety Ask your health care provider: How your surgery site will be marked. What steps will be taken to help prevent infection. These steps may include: Removing hair at the surgery site. Washing skin with a germ-killing soap. Taking antibiotic medicine. General instructions Do not use any products that contain nicotine or tobacco for at least 4 weeks before the procedure. These products include cigarettes, chewing tobacco, and vaping devices, such as e-cigarettes. If you need help quitting, ask your health care provider. Plan to have someone take you home from the hospital. If you will be going home right after the procedure, plan to have a responsible adult care for you for the time you are told. This is important. What happens during the procedure?     An IV will be inserted into one of your veins. You will be given one or more of the following: A medicine to help you relax (sedative). A medicine to numb the area (local anesthetic). A medicine to make you fall asleep (general anesthetic). A medicine that is injected into an area of your body to numb everything below the injection site (regional anesthetic). Your bladder may be emptied with a small tube (catheter). If you have been given a general anesthetic, a tube will be put down your throat to help you breathe. Two small incisions will  be made in your lower abdomen and near your belly button. Your abdomen will be inflated with a gas. This will let the surgeon see better and will give the surgeon room to  work. A lighted tube with camera (laparoscope) will be inserted into your abdomen through one of the incisions. Small instruments will be inserted through the other incision. The fallopian tubes will be tied off, burned (cauterized), or blocked with a clip, ring, or clamp. A small portion in the center of each fallopian tube may be removed. The gas will be released from the abdomen. The incisions will be closed with stitches (sutures). A bandage (dressing) will be placed over the incisions. The procedure may vary among health care providers and hospitals. What happens after the procedure? Your blood pressure, heart rate, breathing rate, and blood oxygen level will be monitored until you leave the hospital. You will be given medicine to help with pain, nausea, and vomiting as needed. You may have vaginal discharge after the procedure. You may need to wear a sanitary napkin. If you were given a sedative during the procedure, it can affect you for several hours. Do not drive or operate machinery until your health care provider says that it is safe. Summary Laparoscopic tubal ligation is a procedure that is done to prevent pregnancy. You should not have this procedure if you want to get pregnant someday or if you are unsure about having more children. The procedure is done using a thin, lighted tube (laparoscope) with a camera attached that will be inserted into your abdomen through an incision. After the procedure you will be given medicine to help with pain, nausea, and vomiting as needed. Plan to have someone take you home from the hospital. This information is not intended to replace advice given to you by your health care provider. Make sure you discuss any questions you have with your health care provider. Document Revised: 02/14/2020 Document Reviewed: 02/14/2020 Elsevier Patient Education  2024 [employer].

## 2023-05-04 ENCOUNTER — Ambulatory Visit (INDEPENDENT_AMBULATORY_CARE_PROVIDER_SITE_OTHER): Payer: BC Managed Care – PPO | Admitting: Surgery

## 2023-05-04 ENCOUNTER — Encounter: Payer: Self-pay | Admitting: Surgery

## 2023-05-04 ENCOUNTER — Ambulatory Visit (INDEPENDENT_AMBULATORY_CARE_PROVIDER_SITE_OTHER): Payer: BC Managed Care – PPO | Admitting: Clinical

## 2023-05-04 ENCOUNTER — Encounter (HOSPITAL_COMMUNITY): Payer: Self-pay | Admitting: Clinical

## 2023-05-04 VITALS — BP 129/87 | HR 82 | Temp 98.6°F | Ht 68.0 in | Wt 211.4 lb

## 2023-05-04 DIAGNOSIS — F419 Anxiety disorder, unspecified: Secondary | ICD-10-CM

## 2023-05-04 DIAGNOSIS — F53 Postpartum depression: Secondary | ICD-10-CM

## 2023-05-04 DIAGNOSIS — R1033 Periumbilical pain: Secondary | ICD-10-CM

## 2023-05-04 DIAGNOSIS — Z630 Problems in relationship with spouse or partner: Secondary | ICD-10-CM

## 2023-05-04 NOTE — Progress Notes (Addendum)
THERAPIST PROGRESS NOTE  Session Time: 8:07-9:04am  Session #8  Participation Level: Active  Behavioral Response: Well Groomed Alert Euthymic and Irritable  Type of Therapy: Individual Therapy  Treatment Goals addressed:  LTG: Daegan will score less than 5 on the Generalized Anxiety Disorder 7 Scale (GAD-7) STG: Ngela will complete at least 80% of assigned homework STG: Nickol will practice problem solving skills 3 times per week for the next 4 weeks. STG: Kyarra will reduce frequency of avoidant behaviors by 50% as evidenced by self-report in therapy sessions LTG: Increase coping skills to manage depression and improve ability to perform daily activities LTG: Maret will score less than 9 on the Patient Health Questionnaire (PHQ-9) STG: Anum will participate in at least 80% of scheduled individual psychotherapy sessions STG: Mandisa will identify cognitive patterns and beliefs that support depression  ProgressTowards Goals: Progressing  Interventions: CBT, Supportive, and Other: detachment    Summary: CASHAE PAREDEZ is a 27 y.o. female who presents with post-partum depression and anxiety for therapy.  She presented oriented x5 and stated she was feeling "okay, it's just a lot, it's just a lot."  CSW evaluated patient's medication compliance, use of coping tools, and self-care, as applicable. She provided an update on her separation with her baby's father, stating she has extended his time he can stay in the home until the end of December when some of his court issues should be resolved.  He continues to want to try the relationship with her, is committed to this, says things like "You don't kiss me anymore" even in the midst of her making it clear it is over.  She reports that when she wants, she feels fine in engaging in sexual activity with him and does not see a reason to stop that.  However, he is jealous over the smallest incident and without reason suspects her  of having another man in her life.  She has been proud of the way in which she is detaching from him and not being available for him.  She does wonder if she needs to think more about what she is willing to do in this relationship or another, but she knows also that is not her current focus in her life.  She tearfully shared that she cannot put her "all" into it anymore.  She shared that she really has stopped talking to people in her life about the issues because they do not understand or they have opinions that are different than hers so she ends up having to justify herself.  Therapy is the only place she really lets all her feelings out.  We talked about her thoughts and CSW pointed out when she seemed to be displaying a cognitive distortion.  We then went through the process of how to challenge these thoughts.  One that is most prevalent for her is mind reading, although she also does show some black-and-white thinking and "should" statements.  She was open and receptive to looking at this.  CSW also expressed to her some concern that she is so decisive so quickly, as she early on expressed that once she has made up her mind a relationship is over, it is just done.  CSW also expressed to her concern about former statements about this being the last time she is going to try to have a relationship, since she is so young.  She agreed to think about these things.  Suicidal/Homicidal: No without intent/plan  Therapist Response: Patient is progressing AEB engaging  in scheduled therapy session.  Throughout the session, CSW gave patient the opportunity to explore thoughts and feelings associated with current life situations and past/present stressors.   CSW challenged patient gently and appropriately to consider different ways of looking at reported issues. CSW encouraged patient's expression of feelings and validated these using empathy, active listening, open body language, and unconditional positive regard.    CSW encouraged patient to schedule more therapy sessions for the future, as needed.      Plan: Return again 1/15, then back to every 2 weeks  Next appointment: 1/15  Recommendations:  Return to therapy, engage in self care behaviors, consider cognitive distortions and apply the challenging questions to those thoughts to come up with more productive, helpful thoughts  Diagnosis:  Postpartum depression associated with second pregnancy  Anxiety disorder, unspecified type  Relationship problem between partners  Collaboration of Care: Primary Care Provider AEB - can see that patient is in therapy  Patient/Guardian was advised Release of Information must be obtained prior to any record release in order to collaborate their care with an outside provider. Patient/Guardian was advised if they have not already done so to contact the registration department to sign all necessary forms in order for [place] to release information regarding their care.   Consent: Patient/Guardian gives verbal consent for treatment and assignment of benefits for services provided during this visit. Patient/Guardian expressed understanding and agreed to proceed.   Lynnell Chad, LCSW 05/04/2023

## 2023-05-04 NOTE — Patient Instructions (Addendum)
Your CT is scheduled for 05/17/2023 8am (arrive by 7:45 am) at [facility] [address].   Umbilical Hernia, Adult  A hernia is a lump of tissue that pushes through an opening in the muscles. An umbilical hernia happens in the belly, near the belly button. The hernia may contain tissues from the small or large intestine. It may also have fatty tissue that covers the intestines. Umbilical hernias in adults may get worse over time. They need to be treated with surgery. There are several types of umbilical hernias. They include: Indirect hernia. This occurs just above or below the belly button. It's the most common type of umbilical hernia in adults. Direct hernia. This type occurs in an opening that's formed by the belly button. Reducible hernia. This hernia comes and goes. You may see it only when you strain, cough, or lift something heavy. This type of hernia can be pushed back into the belly (reduced). Incarcerated hernia. This traps the hernia in the wall of the belly. This type of hernia can't be pushed back into the belly. It can cause a strangulated hernia. Strangulated hernia. This hernia cuts off blood flow to the tissues inside the hernia. The tissues can die if this happens. This type of hernia must be treated right away. What are the causes? An umbilical hernia happens when tissue inside the belly pushes through an opening in the muscles of the belly. What increases the risk? You're more likely to get this hernia if: You strain while lifting or pushing heavy objects. You've had several pregnancies. You have a condition that puts pressure on your belly, and you've had it for a long time. These include: Obesity. A buildup of fluid inside your belly. Vomiting or coughing all the time. Trouble pooping (constipation). You've had surgery that weakened the muscles in the belly. What are the signs or symptoms? The main symptom of this condition is a bulge at the belly button or  near it. The bulge does not cause pain. Other symptoms depend on the type of hernia you have. A reducible hernia may be seen only when you strain, cough, or lift something heavy. Other symptoms may include: Dull pain. A feeling of pressure. An incarcerated hernia may cause very bad pain. Also, you may: Vomit or feel like you may vomit. Not be able to pass gas. A strangulated hernia may cause: Pain that gets worse and worse. Vomiting, or feeling like you may vomit. Pain when you press on the hernia. Change of color on the skin over the hernia. The skin may become red or purple. Trouble pooping. Blood in the poop. How is this diagnosed? This condition may be diagnosed based on: Your symptoms and medical history. A physical exam. You may be asked to cough or strain while standing. These actions will put pressure inside your belly. The pressure can force the hernia through the opening in your muscles. Your health care provider may try to push the hernia back into your belly (reduce). How is this treated? Surgery is the only treatment for an umbilical hernia. Surgery for a strangulated hernia must be done right away. If you have a small hernia that's not incarcerated, you may need to lose weight before the surgery is done. Follow these instructions at home: Managing constipation You may need to take these actions to prevent trouble pooping. This will help to prevent straining. Drink enough fluid to keep your pee (urine) pale yellow. Take over-the-counter or prescription medicines. Eat foods that are high  in fiber, such as beans, whole grains, and fresh fruits and vegetables. Limit foods that are high in fat and sugars, such as fried or sweet foods. General instructions Do not try to push the hernia back in. Lose weight, if told by your provider. Watch your hernia for any changes in color or size. Tell your provider if any changes occur. You may need to avoid activities that put pressure  on your hernia. You may have to avoid lifting. Ask your provider how much you can safely lift. Take over-the-counter and prescription medicines only as told by your provider. Contact a health care provider if: Your hernia gets larger or feels hard. Your hernia becomes painful. You get a fever or chills. Get help right away if: You get very bad pain near the area of the hernia, and the pain comes on suddenly. You have pain and you vomit or feel like you may vomit. The skin over your hernia changes color. These symptoms may be an emergency. Get help right away. Call 911. Do not wait to see if the symptoms go away. Do not drive yourself to the hospital. This information is not intended to replace advice given to you by your health care provider. Make sure you discuss any questions you have with your health care provider. Document Revised: 09/20/2022 Document Reviewed: 09/20/2022 Elsevier Patient Education  2024 [employer].

## 2023-05-04 NOTE — Addendum Note (Signed)
Addended by: Tommie Raymond on: 05/04/2023 02:09 PM   Modules accepted: Orders

## 2023-05-05 ENCOUNTER — Telehealth: Payer: Self-pay

## 2023-05-05 NOTE — Telephone Encounter (Signed)
Wants to know if you would send in an RX for a lower dose of Rybelsus. Please advise.

## 2023-05-08 ENCOUNTER — Ambulatory Visit: Payer: BC Managed Care – PPO | Admitting: Family

## 2023-05-15 ENCOUNTER — Telehealth: Payer: Self-pay | Admitting: Family

## 2023-05-15 NOTE — Telephone Encounter (Signed)
Patient called asking if you could do a lower dose of the ryblesus? She does the 14mg  now and wondering if she can do the dose below it? Please adv

## 2023-05-16 ENCOUNTER — Other Ambulatory Visit: Payer: Self-pay | Admitting: Family

## 2023-05-16 MED ORDER — RYBELSUS 7 MG PO TABS: 7.0000 mg | ORAL_TABLET | Freq: Every day | ORAL | 3 refills | Status: DC

## 2023-05-17 ENCOUNTER — Ambulatory Visit
Admission: RE | Admit: 2023-05-17 | Discharge: 2023-05-17 | Disposition: A | Discharge status: Home / Self Care | Payer: BC Managed Care – PPO | Source: Ambulatory Visit | Attending: Surgery | Admitting: Surgery

## 2023-05-17 DIAGNOSIS — R1033 Periumbilical pain: Secondary | ICD-10-CM | POA: Insufficient documentation

## 2023-05-17 MED ORDER — IOHEXOL 300 MG/ML  SOLN
100.0000 mL | Freq: Once | INTRAMUSCULAR | Status: AC | PRN
  Administered 2023-05-17: 100 mL via INTRAVENOUS

## 2023-05-30 ENCOUNTER — Ambulatory Visit (INDEPENDENT_AMBULATORY_CARE_PROVIDER_SITE_OTHER): Payer: BC Managed Care – PPO | Admitting: Surgery

## 2023-05-30 ENCOUNTER — Encounter: Payer: Self-pay | Admitting: Surgery

## 2023-05-30 VITALS — BP 111/79 | HR 86 | Temp 98.2°F | Ht 68.0 in | Wt 210.0 lb

## 2023-05-30 DIAGNOSIS — K429 Umbilical hernia without obstruction or gangrene: Secondary | ICD-10-CM | POA: Insufficient documentation

## 2023-05-30 DIAGNOSIS — K432 Incisional hernia without obstruction or gangrene: Secondary | ICD-10-CM | POA: Diagnosis not present

## 2023-05-30 NOTE — Progress Notes (Signed)
Patient ID: Penny Gonzalez, female   DOB: [birth year], 27 y.o.   MRN: [id number]  Chief Complaint: Desires tubal ligation, and same anesthetic for ventral hernia repair  History of Present Illness Penny Gonzalez is a 27 y.o. female with a history of the development of a palpable abnormality to the right of her umbilical area during her last pregnancy.  Bothered her somewhat during that pregnancy , and became more painful/troublesome with lifting her newborn with the car seat.  She notes there is a palpable area to the right of her umbilical area where likely there was an incision for her gallbladder surgery.  She does not know of a persistent bulge or mass in the area.  However she is anticipating surgery, for a tubal ligation and would like to combine procedures in the same event.  This will likely happen early January 2025.  Today she returns for follow-up of her CT scan.  CT scan revealed a small ventral defect at the site of concern.  Considering the laxity of her abdomen and the smallness of her hernia.  This is still hard to appreciate on exam.  Past Medical History Past Medical History:  Diagnosis Date   Anemia, iron deficiency, inadequate dietary intake 12/01/2014   Depression    patient has not been officially diagnosed but has symptoms   Diabetes mellitus without complication (HCC)    Recently changed to Glyburide and thinks it is helping   Essential hypertension, benign 03/31/2016   Fetal macrosomia affecting management of mother, antepartum 10/16/2022   History of pre-eclampsia in prior pregnancy, currently pregnant 05/11/2022   History of preterm delivery, currently pregnant 05/11/2022   Irregular periods/menstrual cycles    Joint pain of leg    Major depression 11/23/2015   Obesity in pregnancy 11/29/2017   Obesity in pregnancy, antepartum 04/22/2022   Positive GBS test 10/06/2022   Pre-existing diabetes mellitus during pregnancy in third trimester 06/24/2014    Pre-existing diabetes mellitus in pregnancy 10/15/2022   Pre-existing essential hypertension during pregnancy in third trimester 09/30/2022   Preexisting diabetes complicating pregnancy, antepartum 07/27/2022   Pregnancy with type 2 diabetes mellitus in first trimester 04/22/2022   Sciatica    Supervision of normal pregnancy 04/19/2022   26 y.o. [id number], at [redacted]w[redacted]d based on LMP of 01/29/22, with an Estimated Date of Delivery: 11/05/22.     Sex of baby and name:  " "   Partner:    Ethelene Browns     Factors complicating this pregnancy   Deliveries at 28 week x2     Obesity BMI:   Baseline labs:  Early 1 hour GTT: n/a  P/C ratio:  79  CMP: wnl  A1c: 6.5     H/o Preeclampsia,   ASA at 12 weeks (d/c 35-37 weeks) - discussed 04/21/22     Diabetes       Past Surgical History:  Procedure Laterality Date   CHOLECYSTECTOMY N/A 05/19/2016   Procedure: LAPAROSCOPIC CHOLECYSTECTOMY WITH INTRAOPERATIVE CHOLANGIOGRAM;  Surgeon: Nadeen Landau, MD;  Location: [facility] ORS;  Service: General;  Laterality: N/A;    Allergies  Allergen Reactions   Latex Rash    Pruritic, urticaria    Current Outpatient Medications  Medication Sig Dispense Refill   Semaglutide (RYBELSUS) 7 MG TABS Take 1 tablet (7 mg total) by mouth daily before breakfast. 30 tablet 3   No current facility-administered medications for this visit.    Family History Family History  Problem Relation Age of Onset  Diabetes Mother    Arthritis Father    Diabetes Father    Heart disease Father    Hypertension Father    Stroke Father    Lupus Sister    Deafness Sister    Diabetes Maternal Grandmother    Hypertension Maternal Grandmother    Heart disease Paternal Grandmother    Hypertension Paternal Grandmother       Social History Social History   Tobacco Use   Smoking status: Never    Passive exposure: Never   Smokeless tobacco: Never  Vaping Use   Vaping status: Never Used  Substance Use Topics   Alcohol use: Not Currently     Comment: OCC   Drug use: No        Review of Systems  Constitutional: Negative.   HENT: Negative.    Eyes: Negative.   Respiratory: Negative.    Cardiovascular: Negative.   Gastrointestinal:  Positive for abdominal pain and blood in stool.  Genitourinary: Negative.   Skin: Negative.   Neurological:  Positive for headaches.  Psychiatric/Behavioral: Negative.       Physical Exam Blood pressure 111/79, pulse 86, temperature 98.2 F (36.8 C), height 5\' 8"  (1.727 m), weight 210 lb (95.3 kg), last menstrual period 05/21/2023, SpO2 99%, currently breastfeeding. Last Weight  Most recent update: 05/30/2023  8:43 AM    Weight  95.3 kg (210 lb)             CONSTITUTIONAL: Well developed, and nourished, appropriately responsive and aware without distress.   EYES: Sclera non-icteric.   EARS, NOSE, MOUTH AND THROAT:  The oropharynx is clear. Oral mucosa is pink and moist.    Hearing is intact to voice.  NECK: Trachea is midline, and there is no jugular venous distension.  LYMPH NODES:  Lymph nodes in the neck are not appreciated. RESPIRATORY:  Lungs are clear, and breath sounds are equal bilaterally.  Normal respiratory effort without pathologic use of accessory muscles. CARDIOVASCULAR: Heart is regular in rate and rhythm.   Well perfused.  GI: The abdomen is consistent with postpartum abdominal wall laxity, some evidence of weight loss with soft tissue laxity as well.  This makes identifying a fascial defect somewhat challenging.  There is some palpable scarring near the the umbilical area, some potential of eventration, question surgical scarring versus fascial defect.  Remainder the abdomen is soft, nontender, and nondistended.    MUSCULOSKELETAL:  Symmetrical muscle tone appreciated in all four extremities.    SKIN: Skin turgor is normal. No pathologic skin lesions appreciated.  NEUROLOGIC:  Motor and sensation appear grossly normal.  Cranial nerves are grossly without defect. PSYCH:   Alert and oriented to person, place and time. Affect is appropriate for situation.  Data Reviewed I have personally reviewed what is currently available of the patient's imaging, recent labs and medical records.   Labs:     Latest Ref Rng & Units 04/10/2023    3:51 PM 12/22/2022   10:18 AM 10/16/2022    6:46 AM  CBC  WBC 3.4 - 10.8 x10E3/uL 4.8  5.0  9.0   Hemoglobin 11.1 - 15.9 g/dL 22.0  25.4  27.0   Hematocrit 34.0 - 46.6 % 39.8  39.5  31.0   Platelets 150 - 450 x10E3/uL 286   183       Latest Ref Rng & Units 04/10/2023    3:51 PM 12/22/2022   10:18 AM 10/15/2022   12:52 AM  CMP  Glucose 70 - 99 mg/dL  76  79  255   BUN 6 - 20 mg/dL 9  5  9    Creatinine 0.57 - 1.00 mg/dL 4.69  6.29  5.28   Sodium 134 - 144 mmol/L 142  140  133   Potassium 3.5 - 5.2 mmol/L 4.1  3.9  4.3   Chloride 96 - 106 mmol/L 100  101  105   CO2 20 - 29 mmol/L 25  22  19    Calcium 8.7 - 10.2 mg/dL 9.5  9.2  9.0   Total Protein 6.0 - 8.5 g/dL 6.7  7.0    Total Bilirubin 0.0 - 1.2 mg/dL 0.3  0.5    Alkaline Phos 44 - 121 IU/L 117  155    AST 0 - 40 IU/L 15  15    ALT 0 - 32 IU/L 13  15        Imaging: Radiological images reviewed:  CLINICAL DATA:  Possible umbilical hernia.   EXAM: CT ABDOMEN AND PELVIS WITH CONTRAST   TECHNIQUE: Multidetector CT imaging of the abdomen and pelvis was performed using the standard protocol following bolus administration of intravenous contrast.   RADIATION DOSE REDUCTION: This exam was performed according to the departmental dose-optimization program which includes automated exposure control, adjustment of the mA and/or kV according to patient size and/or use of iterative reconstruction technique.   CONTRAST:  OMNIPAQUE IOHEXOL 300 MG/ML  SOLN   COMPARISON:  MRI abdomen March 05, 2014   FINDINGS: Lower chest: No acute abnormality.   Hepatobiliary: No suspicious hepatic lesion. Gallbladder surgically absent. No biliary ductal dilation.    Pancreas: No pancreatic ductal dilation or evidence of acute inflammation.   Spleen: No splenomegaly.   Adrenals/Urinary Tract: Bilateral adrenal glands appear normal. No hydronephrosis. Kidneys demonstrate symmetric enhancement. Urinary bladder is unremarkable for degree of distension.   Stomach/Bowel: No radiopaque enteric contrast material was administered. Stomach is minimally distended limiting evaluation. No pathologic dilation of small or large bowel. Noninflamed appendix. No evidence of acute bowel inflammation.   Vascular/Lymphatic: Normal caliber abdominal aorta. Smooth IVC contours. Mixing artifact in the IVC at the level of the renal veins. The portal, splenic and superior mesenteric veins are patent. No pathologically enlarged abdominal or pelvic lymph nodes.   Reproductive: Uterus and bilateral adnexa are unremarkable.   Other: Tiny fat containing umbilical hernia.   Musculoskeletal: No acute osseous abnormality.   IMPRESSION: Tiny fat containing umbilical hernia.     Electronically Signed   By: Maudry Mayhew M.D.   On: 05/20/2023 08:44 Assessment    Possible incisional hernia in periumbilical area, difficult to readily appreciate fascial defect. Patient Active Problem List   Diagnosis Date Noted   Umbilical pain 05/04/2023   B12 deficiency due to diet 12/31/2022   Unwanted fertility 07/27/2022   History of pre-eclampsia 04/22/2022   Acanthosis nigricans 06/27/2017   Fatty liver 05/07/2016   Essential hypertension, benign 03/31/2016   Proteinuria 01/30/2016   Vitamin D deficiency 11/19/2015   Other fatigue 11/18/2015   Type 2 diabetes mellitus with hyperglycemia, without long-term current use of insulin (HCC) 11/26/2014   Amenorrhea 11/26/2014    Plan    The small fascial defect will likely be a good access for a midline laparoscopic trocar/port will coordinate/plan further steps with Dr. Oretha Milch office anticipating possible surgery at the turn  of the year. The size of the defect will likely be amenable to suture repair, I do not appreciate any need for consideration of any fascial/mesh reinforcement.  I discussed unlikely possibility of incarceration, strangulation, enlargement in size over time, and the risk of emergency surgery in the face of strangulation.  Also discussed the risk of surgery including recurrence which can be up to 30% in the case of complex hernias, unlikely use of prosthetic materials (mesh) along with the increased risk of infxn, post-op infxn and the possible need for re-operation and removal of mesh if used, and the risks of general anesthetic/reaction to anesthetic medications.  She understands the risks, any and all questions were answered to the patient's satisfaction.    Face-to-face time spent with the patient and accompanying care providers(if present) was 20 minutes, with more than 50% of the time spent counseling, educating, and coordinating care of the patient.    These notes generated with voice recognition software. I apologize for typographical errors.  Campbell Lerner M.D., FACS 05/30/2023, 9:39 AM

## 2023-05-30 NOTE — Patient Instructions (Addendum)
We will speak with Dr Valentino Saxon about her surgery and see if this is something we can do together. We will call you about this.   You have requested for your Umbilical Hernia be repaired. This will be scheduled with Dr. Claudine Mouton at [facility].   Please see your (blue)pre-care sheet for information. Our surgery scheduler will call you to verify surgery date and to go over information.     Umbilical Hernia, Adult A hernia is a bulge of tissue that pushes through an opening between muscles. An umbilical hernia happens in the abdomen, near the belly button (umbilicus). The hernia may contain tissues from the small intestine, large intestine, or fatty tissue covering the intestines (omentum). Umbilical hernias in adults tend to get worse over time, and they require surgical treatment. There are several types of umbilical hernias. You may have: A hernia located just above or below the umbilicus (indirect hernia). This is the most common type of umbilical hernia in adults. A hernia that forms through an opening formed by the umbilicus (direct hernia). A hernia that comes and goes (reducible hernia). A reducible hernia may be visible only when you strain, lift something heavy, or cough. This type of hernia can be pushed back into the abdomen (reduced). A hernia that traps abdominal tissue inside the hernia (incarcerated hernia). This type of hernia cannot be reduced. A hernia that cuts off blood flow to the tissues inside the hernia (strangulated hernia). The tissues can start to die if this happens. This type of hernia requires emergency treatment.  What are the causes? An umbilical hernia happens when tissue inside the abdomen presses on a weak area of the abdominal muscles. What increases the risk? You may have a greater risk of this condition if you: Are obese. Have had several pregnancies. Have a buildup of fluid inside your abdomen (ascites). Have had surgery that weakens  the abdominal muscles.  What are the signs or symptoms? The main symptom of this condition is a painless bulge at or near the belly button. A reducible hernia may be visible only when you strain, lift something heavy, or cough. Other symptoms may include: Dull pain. A feeling of pressure.  Symptoms of a strangulated hernia may include: Pain that gets increasingly worse. Nausea and vomiting. Pain when pressing on the hernia. Skin over the hernia becoming red or purple. Constipation. Blood in the stool.  How is this diagnosed? This condition may be diagnosed based on: A physical exam. You may be asked to cough or strain while standing. These actions increase the pressure inside your abdomen and force the hernia through the opening in your muscles. Your health care provider may try to reduce the hernia by pressing on it. Your symptoms and medical history.  How is this treated? Surgery is the only treatment for an umbilical hernia. Surgery for a strangulated hernia is done as soon as possible. If you have a small hernia that is not incarcerated, you may need to lose weight before having surgery. Follow these instructions at home: Lose weight, if told by your health care provider. Do not try to push the hernia back in. Watch your hernia for any changes in color or size. Tell your health care provider if any changes occur. You may need to avoid activities that increase pressure on your hernia. Do not lift anything that is heavier than 10 lb (4.5 kg) until your health care provider says that this is safe. Take over-the-counter and prescription medicines only  as told by your health care provider. Keep all follow-up visits as told by your health care provider. This is important. Contact a health care provider if: Your hernia gets larger. Your hernia becomes painful. Get help right away if: You develop sudden, severe pain near the area of your hernia. You have pain as well as nausea or  vomiting. You have pain and the skin over your hernia changes color. You develop a fever. This information is not intended to replace advice given to you by your health care provider. Make sure you discuss any questions you have with your health care provider. Document Released: 10/30/2015 Document Revised: 01/31/2016 Document Reviewed: 10/30/2015 Elsevier Interactive Patient Education  [employer].

## 2023-06-01 ENCOUNTER — Ambulatory Visit: Payer: BC Managed Care – PPO | Admitting: Surgery

## 2023-06-03 ENCOUNTER — Encounter: Payer: Self-pay | Admitting: Family

## 2023-06-03 DIAGNOSIS — Z6831 Body mass index (BMI) 31.0-31.9, adult: Secondary | ICD-10-CM | POA: Insufficient documentation

## 2023-06-03 NOTE — Assessment & Plan Note (Signed)
Checking labs today.  Will continue supplements as needed.  

## 2023-06-03 NOTE — Assessment & Plan Note (Signed)
Continue current meds.  Will adjust as needed based on results.  The patient is asked to make an attempt to improve diet and exercise patterns to aid in medical management of this problem. Addressed importance of increasing and maintaining water intake.   

## 2023-06-03 NOTE — Assessment & Plan Note (Signed)
Checking labs today. Will call pt. With results  Continue current diabetes POC, as patient has been well controlled on current regimen.  Will adjust meds if needed based on labs.  

## 2023-06-03 NOTE — Assessment & Plan Note (Signed)
Will set up for referral to general surgery for pt.

## 2023-06-03 NOTE — Assessment & Plan Note (Signed)
Blood pressure well controlled with current medications.  Continue current therapy.  Will reassess at follow up.  

## 2023-06-12 ENCOUNTER — Telehealth: Payer: Self-pay | Admitting: Surgery

## 2023-06-12 NOTE — Telephone Encounter (Signed)
Surgery for umbilical hernia repair combined with OB/GYN was scheduled for 07/05/23, then patient rescheduled to 08/09/23.  Now wants to reschedule for March.  We will need to see patient back in office prior to rescheduling.  Her follow up appt with Dr. Claudine Mouton is March 6th.  Patient further states she may need to reschedule her follow up because of her birthday, but will let [place] know as time gets closer.

## 2023-06-28 ENCOUNTER — Encounter (HOSPITAL_COMMUNITY): Payer: Self-pay | Admitting: Clinical

## 2023-06-28 ENCOUNTER — Encounter: Payer: BC Managed Care – PPO | Admitting: Obstetrics and Gynecology

## 2023-06-28 ENCOUNTER — Ambulatory Visit (INDEPENDENT_AMBULATORY_CARE_PROVIDER_SITE_OTHER): Payer: BC Managed Care – PPO | Admitting: Clinical

## 2023-06-28 DIAGNOSIS — F419 Anxiety disorder, unspecified: Secondary | ICD-10-CM

## 2023-06-28 DIAGNOSIS — Z630 Problems in relationship with spouse or partner: Secondary | ICD-10-CM | POA: Diagnosis not present

## 2023-06-28 DIAGNOSIS — F331 Major depressive disorder, recurrent, moderate: Secondary | ICD-10-CM | POA: Diagnosis not present

## 2023-06-28 NOTE — Progress Notes (Signed)
THERAPIST PROGRESS NOTE  Session Time: 4:05-4:59pm  Session #9  Virtual Visit via Video Note  I connected with Penny Gonzalez on 06/28/23 at  4:00 PM EST by a video enabled telemedicine application and verified that I am speaking with the correct person using two identifiers.  Location: Patient: home  Provider: [facility] outpatient therapy office   I discussed the limitations of evaluation and management by telemedicine and the availability of in person appointments. The patient expressed understanding and agreed to proceed.   I discussed the assessment and treatment plan with the patient. The patient was provided an opportunity to ask questions and all were answered. The patient agreed with the plan and demonstrated an understanding of the instructions.   The patient was advised to call back or seek an in-person evaluation if the symptoms worsen or if the condition fails to improve as anticipated.  I provided 54 minutes of non-face-to-face time during this encounter.  Lynnell Chad, LCSW    Participation Level: Active  Behavioral Response: Well Groomed Alert Euthymic   Type of Therapy: Individual Therapy  Treatment Goals addressed:  New treatment plan established:  Problem: Anxiety Goal: LTG: Emerita will score less than 5 on the Generalized Anxiety Disorder 7 Scale (GAD-7) Goal: STG: Malashia will complete at least 80% of assigned homework Goal: STG: Sky will practice problem solving skills 3 times per week for the next 4 weeks. Goal: STG: Aryana will reduce frequency of avoidant behaviors by 50% as evidenced by self-report in therapy sessions Goal: STG: Learn about boundary types, how to implement them, and how to enforce them so that feels more empowered and content with being able to maintain more helpful, appropriate boundaries in the future for a more balanced result.   Goal: LTG: Learn breathing techniques and grounding techniques at an  age-appropriate and ability-appropriate level and demonstrate mastery in session then report independent use of these skills out of session.   Goal: STG: Learn about detachment from loved one's negative behaviors to be healthier emotionally, physically, and spiritually herself and work to detach from loved ones' poor choices.   Intervention: Provide Schylar with educational information and reading material on anxiety, its causes, and symptoms. Intervention: Work with patient individually to identify the major components of a recent episode of anxiety: physical symptoms, major thoughts and images, and major behaviors they experienced Intervention: Work with Lelon Mast to complete a TRAP analysis (trigger, response, avoidance pattern) of their avoidant behaviors. Intervention: Work with Lelon Mast to identify a minimum of 3 consequences of avoidance. Intervention: Work with Lelon Mast to identify a minimum of 3 alternative coping behaviors to avoidance. Intervention: Perform motivational interviewing regarding engagement and attendance with therapy Intervention: Perform motivational interviewing regarding completion of homework assignments Intervention: Teach types of boundaries, help with identification of where boundaries are needed, help patient come up with a plan for implementing and enforcing boundaries, and provide feedback and encouragement throughout process. Intervention: Provide community resources for family support such as [employer]'s Family to Family or Adult Children of Alcoholics Anonymous Intervention: Educate on relaxation techniques and the rationale for learning these techniques (including breathing skills, grounding exercises, and mindfulness practice). Intervention: Educate on the concept of detachment and provide opportunities for this to be explored in session, offer suggestions on how to implement detachment in various life areas.  Problem: OP Depression Goal: LTG: Increase coping skills  to manage depression and improve ability to perform daily activities Goal: LTG: Tynlee will score less than 9 on the Patient Health  Questionnaire (PHQ-9) Goal: STG: Hilliary will participate in at least 80% of scheduled individual psychotherapy sessions Goal: STG: Edrena will identify cognitive patterns and beliefs that support depression Goal: STG: Sadiya will reduce frequency of avoidant behaviors by 50% as evidenced by self-report in therapy sessions Goal: STG: Identify and decrease cognitive distortions contributing negatively to mood and behavior by identifying 5-7 cognitive distortions that are present; learn how to come up with replacement thoughts that are more balanced, realistic, and helpful.   Goal: LTG: Explore personal core beliefs, rules and assumptions, and cognitive distortions through therapist using Cognitive Behavioral Therapy; learn about Behavioral Activation and Acting As If.   Goal: STG: Learn and practice communication techniques such as "I" statements, open-ended questions, reflective listening, assertiveness, fair fighting rules, active listening, initiating conversations, and more as necessary and taught in session.   Intervention: Karleigh will identify 2-3 personal goals for managing depression symptoms to work on during the current treatment episode Intervention: Therapist will educate patient on cognitive distortions and the rationale for treatment of depression Intervention: Ninel will identify 2-4 trauma related cognitive distortions Intervention: Hanna will identify 5-7 cognitive distortions they are currently using and write reframing statements to replace them Intervention: Therapist will review PLEASE Skills (Treat Physical Illness, Balance Eating, Avoid Mood-Altering Substances, Balance Sleep and Get Exercise) with patient Intervention: Sintia will review pleasant activities list and select 1-2 activities to practice weekly for the next 26  weeks Intervention: Educate on the cognitive behavioral model, the possible cognitive distortions, and teach how to challenge existing thoughts and replace them with more helpful ones. Intervention: Use Cognitive Behavioral Therapy to explore core beliefs, rules and assumptions, and cognitive distortions; teach about Behavioral Activation and Acting As If. Intervention: Provide psychoeducation on communication techniques such as active listening, "I" statements, open-ended questions, reflective listening, assertiveness, fair fighting rules, initiating conversations, and more as needed.  ProgressTowards Goals: Progressing  Interventions: Assertiveness Training, Supportive, and Other: treatment planning and assessment    Summary: CASARAH GARRICK is a 28 y.o. female who presents with post-partum depression and anxiety for therapy.  She presented oriented x5 and stated she was feeling "like I'm on a roller coaster."  CSW evaluated patient's medication compliance, use of coping tools, and self-care, as applicable.   She shared how the holidays were for her and what her current frustrations are, including that her significant other's court appearances were cancelled from December to February, so he is still staying with her.  She reported that she has remained detached but continues to work on further detachment at the same time.  She is maintaining a positive environment for her children, states that she does not think about the situation or put too much emphasis on the fact that he will need to leave soon.  She reported several problems occurring, however.  First, she is having some difficulty with sleep.  Second, she wants to find a job within her company that is remote so that she can be at home with her kids, home school them, and be away from co-workers.  Third, her daughter is having some problems and had a fight in school today.  She also shared frustrations with her daughter's father who does not  follow through on things he says he will do, always citing his other children as the reason.  She is currently considering the possibility of filing for sole custody so that she does not have to deal with him any longer.  She was surprised to be able  to share that the less she has worried, the more things are going smoothly for her.  She did have an incident with her current significant other who is being asked to leave, but she was not bothered by it (to his surprise) because she told herself she was not going to obsess with what he does with/about other women any longer because it drains her peace.  She stated she wants him to leave so that she can have peace, consistency, and stability for herself and for her children.  She is putting an emphasis on her faith currently and for 2025 stated her words for the year are "remove, replace, reveal."  CSW re-administered the PHQ-9 and GAD-7 and went over the improvements with patient.  PHQ-2 score was 1, down from 12 in June 2024, and GAD-7 score was 9, down from 14 in June 2024.  Suicidal/Homicidal: No without intent/plan  Therapist Response: Patient is progressing AEB engaging in scheduled therapy session.  Throughout the session, CSW gave patient the opportunity to explore thoughts and feelings associated with current life situations and past/present stressors.   CSW challenged patient gently and appropriately to consider different ways of looking at reported issues. CSW encouraged patient's expression of feelings and validated these using empathy, active listening, open body language, and unconditional positive regard.   CSW encouraged patient to schedule more therapy sessions for the future, as needed.      Plan: Return every 2 weeks, next appointment: 1/27  Recommendations:  Return to therapy, engage in self care behaviors, continue to focus on her 2025 thoughts of "remove, replace, reveal"  Diagnosis:  Major depressive disorder, recurrent episode,  moderate degree (HCC)  Anxiety disorder, unspecified type  Relationship problem between partners  Collaboration of Care: Primary Care Provider AEB - can see that patient is in therapy although may not be able to read notes  Patient/Guardian was advised Release of Information must be obtained prior to any record release in order to collaborate their care with an outside provider. Patient/Guardian was advised if they have not already done so to contact the registration department to sign all necessary forms in order for [place] to release information regarding their care.   Consent: Patient/Guardian gives verbal consent for treatment and assignment of benefits for services provided during this visit. Patient/Guardian expressed understanding and agreed to proceed.   Lynnell Chad, LCSW 06/28/2023       06/28/2023    4:43 PM 11/23/2022    4:28 PM  Depression screen PHQ 2/9  Decreased Interest 1 1  Down, Depressed, Hopeless 0 2  PHQ - 2 Score 1 3  Altered sleeping  2  Tired, decreased energy  2  Change in appetite  2  Feeling bad or failure about yourself   1  Trouble concentrating  1  Moving slowly or fidgety/restless  1  Suicidal thoughts  0  PHQ-9 Score  12  Difficult doing work/chores  Somewhat difficult      06/28/2023    4:44 PM 11/23/2022    4:29 PM  GAD 7 : Generalized Anxiety Score  Nervous, Anxious, on Edge 2 2  Control/stop worrying 0 2  Worry too much - different things 1 2  Trouble relaxing 2 2  Restless 1 1  Easily annoyed or irritable 2 2  Afraid - awful might happen 1 3  Total GAD 7 Score 9 14  Anxiety Difficulty Somewhat difficult Somewhat difficult

## 2023-07-10 ENCOUNTER — Ambulatory Visit (INDEPENDENT_AMBULATORY_CARE_PROVIDER_SITE_OTHER): Payer: BC Managed Care – PPO | Admitting: Clinical

## 2023-07-10 DIAGNOSIS — Z91198 Patient's noncompliance with other medical treatment and regimen for other reason: Secondary | ICD-10-CM

## 2023-07-10 NOTE — Progress Notes (Signed)
Therapy Progress Note  Patient had an appointment scheduled with therapist on 07/10/2023  at 8:00am.  CSW received an email from her stating she has a family emergency and had tried to call to cancel on Friday 1/24 without connecting to anyone.    Encounter Diagnosis  Name Primary?   Failure to attend appointment with reason given Yes     Ambrose Mantle, LCSW 07/10/2023, 8:06 AM

## 2023-07-12 ENCOUNTER — Ambulatory Visit (HOSPITAL_COMMUNITY): Payer: BC Managed Care – PPO | Admitting: Clinical

## 2023-07-12 ENCOUNTER — Encounter (HOSPITAL_COMMUNITY): Payer: Self-pay | Admitting: Clinical

## 2023-07-12 DIAGNOSIS — F331 Major depressive disorder, recurrent, moderate: Secondary | ICD-10-CM | POA: Diagnosis not present

## 2023-07-12 DIAGNOSIS — Z630 Problems in relationship with spouse or partner: Secondary | ICD-10-CM

## 2023-07-12 DIAGNOSIS — F419 Anxiety disorder, unspecified: Secondary | ICD-10-CM

## 2023-07-12 NOTE — Progress Notes (Signed)
THERAPIST PROGRESS NOTE  Session Time: 5:00-5:55pm  Session #10  Virtual Visit via Video Note  I connected with Penny Gonzalez on 07/12/23 at  5:00 PM EST by a video enabled telemedicine application and verified that I am speaking with the correct person using two identifiers.  Location: Patient: car and home Provider: [facility] outpatient therapy office   I discussed the limitations of evaluation and management by telemedicine and the availability of in person appointments. The patient expressed understanding and agreed to proceed.   I discussed the assessment and treatment plan with the patient. The patient was provided an opportunity to ask questions and all were answered. The patient agreed with the plan and demonstrated an understanding of the instructions.   The patient was advised to call back or seek an in-person evaluation if the symptoms worsen or if the condition fails to improve as anticipated.  I provided 55 minutes of non-face-to-face time during this encounter.  Lynnell Chad, LCSW    Participation Level: Active  Behavioral Response: Well Groomed Alert Euthymic   Type of Therapy: Individual Therapy  Treatment Goals addressed:  Goal: LTG: Atisha will score less than 5 on the Generalized Anxiety Disorder 7 Scale (GAD-7) Goal: STG: Mikahla will complete at least 80% of assigned homework Goal: STG: Icis will practice problem solving skills 3 times per week for the next 4 weeks. Goal: STG: Erna will reduce frequency of avoidant behaviors by 50% as evidenced by self-report in therapy sessions Goal: STG: Learn about boundary types, how to implement them, and how to enforce them so that feels more empowered and content with being able to maintain more helpful, appropriate boundaries in the future for a more balanced result.   Goal: LTG: Learn breathing techniques and grounding techniques at an age-appropriate and ability-appropriate level and  demonstrate mastery in session then report independent use of these skills out of session.   Goal: STG: Learn about detachment from loved one's negative behaviors to be healthier emotionally, physically, and spiritually herself and work to detach from loved ones' poor choices.   Goal: LTG: Increase coping skills to manage depression and improve ability to perform daily activities Goal: LTG: Danyah will score less than 9 on the Patient Health Questionnaire (PHQ-9) Goal: STG: Suella will participate in at least 80% of scheduled individual psychotherapy sessions Goal: STG: Jazia will identify cognitive patterns and beliefs that support depression Goal: STG: Medina will reduce frequency of avoidant behaviors by 50% as evidenced by self-report in therapy sessions Goal: STG: Identify and decrease cognitive distortions contributing negatively to mood and behavior by identifying 5-7 cognitive distortions that are present; learn how to come up with replacement thoughts that are more balanced, realistic, and helpful.   Goal: LTG: Explore personal core beliefs, rules and assumptions, and cognitive distortions through therapist using Cognitive Behavioral Therapy; learn about Behavioral Activation and Acting As If.   Goal: STG: Learn and practice communication techniques such as "I" statements, open-ended questions, reflective listening, assertiveness, fair fighting rules, active listening, initiating conversations, and more as necessary and taught in session.    ProgressTowards Goals: Progressing  Interventions: CBT and Supportive   Summary: Penny Gonzalez is a 28 y.o. female who presents with post-partum depression and anxiety for therapy. She presented oriented x5 and stated she was feeling "unusually emotional and weepy today."  CSW evaluated patient's medication compliance, use of coping tools, and self-care, as applicable.   This was supposed to have been a group session, but she was  the only  person who showed up so it was turned into an individual session.  She has had a number of negative interactions with people since the new administration of government was seated and needed to process how people's actions from so far away can affect her.  She repeatedly said "the world is so depressing, just more and more."  She shared the things that have been happening, received positive reinforcement from CSW for how she handled those situations.  She also talked about a customer who was inappropriate with her and how she handled that, again was given kudos for appropriately handling it.  Finally, she talked about her "partner" from whom she is distancing herself and how she continues to feel more and more distant from him and unemotional.  CSW shared with her that the emotional state she is finding herself in is fairly common right now and that she can focus on checking her thoughts to see if they are based in reality so that she will know if her feelings are based in fact or illusion.  We talked of how to change those thoughts which she said was a good reminder for her.  We also talked about use of the serenity prayer to accept the things we cannot change and focus our efforts on the things we can actually do.  Suicidal/Homicidal: No without intent/plan  Therapist Response: Patient is progressing AEB engaging in scheduled therapy session.  Throughout the session, CSW gave patient the opportunity to explore thoughts and feelings associated with current life situations and past/present stressors.   CSW challenged patient gently and appropriately to consider different ways of looking at reported issues. CSW encouraged patient's expression of feelings and validated these using empathy, active listening, open body language, and unconditional positive regard.   CSW encouraged patient to schedule more therapy sessions for the future, as needed.      Plan: Return at next available appointment then every 2 weeks,  next appointment: 4/17  Recommendations:  Return to therapy, engage in self care behaviors, continue to focus on her 2025 thoughts of "remove, replace, reveal"  Diagnosis:  Major depressive disorder, recurrent episode, moderate degree (HCC)  Anxiety disorder, unspecified type  Relationship problem between partners  Collaboration of Care: Primary Care Provider AEB - can see that patient is in therapy although may not be able to read notes  Patient/Guardian was advised Release of Information must be obtained prior to any record release in order to collaborate their care with an outside provider. Patient/Guardian was advised if they have not already done so to contact the registration department to sign all necessary forms in order for [place] to release information regarding their care.   Consent: Patient/Guardian gives verbal consent for treatment and assignment of benefits for services provided during this visit. Patient/Guardian expressed understanding and agreed to proceed.   Lynnell Chad, LCSW 07/12/2023

## 2023-07-26 ENCOUNTER — Ambulatory Visit (INDEPENDENT_AMBULATORY_CARE_PROVIDER_SITE_OTHER): Payer: BC Managed Care – PPO | Admitting: Clinical

## 2023-07-26 ENCOUNTER — Encounter (HOSPITAL_COMMUNITY): Payer: Self-pay | Admitting: Clinical

## 2023-07-26 DIAGNOSIS — F419 Anxiety disorder, unspecified: Secondary | ICD-10-CM | POA: Diagnosis not present

## 2023-07-26 DIAGNOSIS — F331 Major depressive disorder, recurrent, moderate: Secondary | ICD-10-CM | POA: Diagnosis not present

## 2023-07-26 NOTE — Progress Notes (Signed)
Therapy Group Progress Note   07/26/2023   Group Time:  5:00pm-6:00pm  Participation Level:  Active   Behavioral Response: Appropriate and Sharing   Type of Therapy: Group Therapy   Check-In:  Patient shared that she is in group because she is waiting for a 1:1 session.  Current Concerns Shared:  She shared that God answered her prayer about seeking peace, and as a result her ex-boyfriend is now out of the home completely.  She feels that a weight has been lifted and that everything is different now.  She feels the burden of being a single parent, but feels at the same time that she now knows what she has to do, since it all is up to her.  She left group early, sending an email to CSW that she felt uncomfortable with someone else in group talking about their faith in a way that she could not listen to since her own faith journey is so important to her.  Intervention(s):  Boundaries were discussed and commonalities among patients were pointed out.  Summary of Progress:  Penny Gonzalez verbalized full understanding of concepts about boundaries  as presented.     Recommendations:  It is recommended that Penny Gonzalez continue considering all brought up by CSW and fellow patients throughout group and return to group at next scheduled time or to individual therapy at next scheduled session.  Progress Towards Goals: Progressing   No diagnosis found.   Virtual Visit via Video Note  I connected with Penny Gonzalez on 07/26/23 at 5:00pm by [employer] and verified that I am speaking with the correct person using two identifiers.  Location: Patient: Car sitting in parking lot Provider: [facility] Therapy Office - [facility]   I discussed the limitations of evaluation and management by telemedicine and the availability of in person appointments. The patient expressed understanding and agreed to proceed.   I discussed the assessment and treatment plan with  the patient. The patient was provided an opportunity to ask questions and all were answered. The patient agreed with the plan and demonstrated an understanding of the instructions.   The patient was advised to call back or seek an in-person evaluation if the symptoms worsen or if the condition fails to improve as anticipated.  I provided 60 minutes of non-face-to-face time during this encounter.  Ambrose Mantle, LCSW 07/26/2023, 5:37 PM

## 2023-07-31 ENCOUNTER — Other Ambulatory Visit: Payer: BC Managed Care – PPO

## 2023-08-04 ENCOUNTER — Encounter: Payer: Self-pay | Admitting: Obstetrics and Gynecology

## 2023-08-08 ENCOUNTER — Encounter (HOSPITAL_COMMUNITY): Payer: Self-pay | Admitting: Clinical

## 2023-08-09 ENCOUNTER — Ambulatory Visit (INDEPENDENT_AMBULATORY_CARE_PROVIDER_SITE_OTHER): Payer: BC Managed Care – PPO | Admitting: Clinical

## 2023-08-09 ENCOUNTER — Ambulatory Visit: Admit: 2023-08-09 | Payer: BC Managed Care – PPO | Admitting: Surgery

## 2023-08-09 DIAGNOSIS — Z91198 Patient's noncompliance with other medical treatment and regimen for other reason: Secondary | ICD-10-CM

## 2023-08-09 SURGERY — HERNIA REPAIR UMBILICAL ADULT
Anesthesia: General

## 2023-08-09 NOTE — Progress Notes (Addendum)
 Penny Gonzalez was scheduled to attend group therapy on 08/09/23 from 5:00-6:00pm.  A MyChart message was sent to remind her that this group is held via [employer] instead of through [employer].  An email was sent with the same reminder, as well as the link to the meeting on [employer].  When it was noted that Penny Gonzalez had viewed the MyChart message, a reminder phone call was not made as well.  She had previously expressed that she was not comfortable with the way other group members spoke about their faith, so she was not sure she would actually try group again.  Penny Gonzalez did not show up for the group despite these reminders.   Encounter Diagnosis  Name Primary?   Failure to attend appointment with reason given Yes     Ambrose Mantle, LCSW 08/09/2023, 6:18 PM

## 2023-08-16 NOTE — Progress Notes (Deleted)
 Patient ID: Penny Gonzalez, female   DOB: 05-Nov-1995, 28 y.o.   MRN: [id number]  Chief Complaint: Desires tubal ligation, and same anesthetic for ventral hernia repair  History of Present Illness Penny Gonzalez is a 28 y.o. female with a history of the development of a palpable abnormality to the right of her umbilical area during her last pregnancy.  Bothered her somewhat during that pregnancy , and became more painful/troublesome with lifting her newborn with the car seat.  She notes there is a palpable area to the right of her umbilical area where likely there was an incision for her gallbladder surgery.  She does not know of a persistent bulge or mass in the area.  However she is anticipating surgery, for a tubal ligation and would like to combine procedures in the same event.  This will likely happen early January 2025.  Today she returns for follow-up of her CT scan.  CT scan revealed a small ventral defect at the site of concern.  Considering the laxity of her abdomen and the smallness of her hernia.  This is still hard to appreciate on exam.  Past Medical History Past Medical History:  Diagnosis Date   Anemia, iron deficiency, inadequate dietary intake 12/01/2014   Depression    patient has not been officially diagnosed but has symptoms   Diabetes mellitus without complication (HCC)    Recently changed to Glyburide and thinks it is helping   Essential hypertension, benign 03/31/2016   Fetal macrosomia affecting management of mother, antepartum 10/16/2022   History of pre-eclampsia in prior pregnancy, currently pregnant 05/11/2022   History of preterm delivery, currently pregnant 05/11/2022   Irregular periods/menstrual cycles    Joint pain of leg    Major depression 11/23/2015   Obesity in pregnancy 11/29/2017   Obesity in pregnancy, antepartum 04/22/2022   Positive GBS test 10/06/2022   Pre-existing diabetes mellitus during pregnancy in third trimester 06/24/2014    Pre-existing diabetes mellitus in pregnancy 10/15/2022   Pre-existing essential hypertension during pregnancy in third trimester 09/30/2022   Preexisting diabetes complicating pregnancy, antepartum 07/27/2022   Pregnancy with type 2 diabetes mellitus in first trimester 04/22/2022   Sciatica    Supervision of normal pregnancy 04/19/2022   28 y.o. [id number], at [redacted]w[redacted]d based on LMP of 01/29/22, with an Estimated Date of Delivery: 11/05/22.     Sex of baby and name:  " "   Partner:    Ethelene Browns     Factors complicating this pregnancy   Deliveries at 58 week x2     Obesity BMI:   Baseline labs:  Early 1 hour GTT: n/a  P/C ratio:  79  CMP: wnl  A1c: 6.5     H/o Preeclampsia,   ASA at 12 weeks (d/c 35-37 weeks) - discussed 04/21/22     Diabetes       Past Surgical History:  Procedure Laterality Date   CHOLECYSTECTOMY N/A 05/19/2016   Procedure: LAPAROSCOPIC CHOLECYSTECTOMY WITH INTRAOPERATIVE CHOLANGIOGRAM;  Surgeon: Nadeen Landau, MD;  Location: [facility] ORS;  Service: General;  Laterality: N/A;    Allergies  Allergen Reactions   Latex Rash    Pruritic, urticaria    Current Outpatient Medications  Medication Sig Dispense Refill   Semaglutide (RYBELSUS) 7 MG TABS Take 1 tablet (7 mg total) by mouth daily before breakfast. 30 tablet 3   No current facility-administered medications for this visit.    Family History Family History  Problem Relation Age of Onset  Diabetes Mother    Arthritis Father    Diabetes Father    Heart disease Father    Hypertension Father    Stroke Father    Lupus Sister    Deafness Sister    Diabetes Maternal Grandmother    Hypertension Maternal Grandmother    Heart disease Paternal Grandmother    Hypertension Paternal Grandmother       Social History Social History   Tobacco Use   Smoking status: Never    Passive exposure: Never   Smokeless tobacco: Never  Vaping Use   Vaping status: Never Used  Substance Use Topics   Alcohol use: Not Currently     Comment: OCC   Drug use: No        Review of Systems  Constitutional: Negative.   HENT: Negative.    Eyes: Negative.   Respiratory: Negative.    Cardiovascular: Negative.   Gastrointestinal:  Positive for abdominal pain and blood in stool.  Genitourinary: Negative.   Skin: Negative.   Neurological:  Positive for headaches.  Psychiatric/Behavioral: Negative.       Physical Exam currently breastfeeding.    CONSTITUTIONAL: Well developed, and nourished, appropriately responsive and aware without distress.   EYES: Sclera non-icteric.   EARS, NOSE, MOUTH AND THROAT:  The oropharynx is clear. Oral mucosa is pink and moist.    Hearing is intact to voice.  NECK: Trachea is midline, and there is no jugular venous distension.  LYMPH NODES:  Lymph nodes in the neck are not appreciated. RESPIRATORY:  Lungs are clear, and breath sounds are equal bilaterally.  Normal respiratory effort without pathologic use of accessory muscles. CARDIOVASCULAR: Heart is regular in rate and rhythm.   Well perfused.  GI: The abdomen is consistent with postpartum abdominal wall laxity, some evidence of weight loss with soft tissue laxity as well.  This makes identifying a fascial defect somewhat challenging.  There is some palpable scarring near the the umbilical area, some potential of eventration, question surgical scarring versus fascial defect.  Remainder the abdomen is soft, nontender, and nondistended.    MUSCULOSKELETAL:  Symmetrical muscle tone appreciated in all four extremities.    SKIN: Skin turgor is normal. No pathologic skin lesions appreciated.  NEUROLOGIC:  Motor and sensation appear grossly normal.  Cranial nerves are grossly without defect. PSYCH:  Alert and oriented to person, place and time. Affect is appropriate for situation.  Data Reviewed I have personally reviewed what is currently available of the patient's imaging, recent labs and medical records.   Labs:     Latest Ref Rng &  Units 04/10/2023    3:51 PM 12/22/2022   10:18 AM 10/16/2022    6:46 AM  CBC  WBC 3.4 - 10.8 x10E3/uL 4.8  5.0  9.0   Hemoglobin 11.1 - 15.9 g/dL 16.1  09.6  04.5   Hematocrit 34.0 - 46.6 % 39.8  39.5  31.0   Platelets 150 - 450 x10E3/uL 286   183       Latest Ref Rng & Units 04/10/2023    3:51 PM 12/22/2022   10:18 AM 10/15/2022   12:52 AM  CMP  Glucose 70 - 99 mg/dL 76  79  409   BUN 6 - 20 mg/dL 9  5  9    Creatinine 0.57 - 1.00 mg/dL 8.11  9.14  7.82   Sodium 134 - 144 mmol/L 142  140  133   Potassium 3.5 - 5.2 mmol/L 4.1  3.9  4.3  Chloride 96 - 106 mmol/L 100  101  105   CO2 20 - 29 mmol/L 25  22  19    Calcium 8.7 - 10.2 mg/dL 9.5  9.2  9.0   Total Protein 6.0 - 8.5 g/dL 6.7  7.0    Total Bilirubin 0.0 - 1.2 mg/dL 0.3  0.5    Alkaline Phos 44 - 121 IU/L 117  155    AST 0 - 40 IU/L 15  15    ALT 0 - 32 IU/L 13  15        Imaging: Radiological images reviewed:  CLINICAL DATA:  Possible umbilical hernia.   EXAM: CT ABDOMEN AND PELVIS WITH CONTRAST   TECHNIQUE: Multidetector CT imaging of the abdomen and pelvis was performed using the standard protocol following bolus administration of intravenous contrast.   RADIATION DOSE REDUCTION: This exam was performed according to the departmental dose-optimization program which includes automated exposure control, adjustment of the mA and/or kV according to patient size and/or use of iterative reconstruction technique.   CONTRAST:  OMNIPAQUE IOHEXOL 300 MG/ML  SOLN   COMPARISON:  MRI abdomen March 05, 2014   FINDINGS: Lower chest: No acute abnormality.   Hepatobiliary: No suspicious hepatic lesion. Gallbladder surgically absent. No biliary ductal dilation.   Pancreas: No pancreatic ductal dilation or evidence of acute inflammation.   Spleen: No splenomegaly.   Adrenals/Urinary Tract: Bilateral adrenal glands appear normal. No hydronephrosis. Kidneys demonstrate symmetric enhancement. Urinary bladder is  unremarkable for degree of distension.   Stomach/Bowel: No radiopaque enteric contrast material was administered. Stomach is minimally distended limiting evaluation. No pathologic dilation of small or large bowel. Noninflamed appendix. No evidence of acute bowel inflammation.   Vascular/Lymphatic: Normal caliber abdominal aorta. Smooth IVC contours. Mixing artifact in the IVC at the level of the renal veins. The portal, splenic and superior mesenteric veins are patent. No pathologically enlarged abdominal or pelvic lymph nodes.   Reproductive: Uterus and bilateral adnexa are unremarkable.   Other: Tiny fat containing umbilical hernia.   Musculoskeletal: No acute osseous abnormality.   IMPRESSION: Tiny fat containing umbilical hernia.     Electronically Signed   By: Maudry Mayhew M.D.   On: 05/20/2023 08:44 Assessment    Possible incisional hernia in periumbilical area, difficult to readily appreciate fascial defect. Patient Active Problem List   Diagnosis Date Noted   BMI 31.0-31.9,adult 06/03/2023   Umbilical hernia without obstruction and without gangrene 05/30/2023   Umbilical pain 05/04/2023   B12 deficiency due to diet 12/31/2022   Unwanted fertility 07/27/2022   History of pre-eclampsia 04/22/2022   Acanthosis nigricans 06/27/2017   Fatty liver 05/07/2016   Essential hypertension, benign 03/31/2016   Proteinuria 01/30/2016   Vitamin D deficiency 11/19/2015   Other fatigue 11/18/2015   Type 2 diabetes mellitus with hyperglycemia, without long-term current use of insulin (HCC) 11/26/2014   Amenorrhea 11/26/2014    Plan    The small fascial defect will likely be a good access for a midline laparoscopic trocar/port will coordinate/plan further steps with Dr. Oretha Milch office anticipating possible surgery at the turn of the year. The size of the defect will likely be amenable to suture repair, I do not appreciate any need for consideration of any fascial/mesh  reinforcement.  I discussed unlikely possibility of incarceration, strangulation, enlargement in size over time, and the risk of emergency surgery in the face of strangulation.  Also discussed the risk of surgery including recurrence which can be up to 30% in  the case of complex hernias, unlikely use of prosthetic materials (mesh) along with the increased risk of infxn, post-op infxn and the possible need for re-operation and removal of mesh if used, and the risks of general anesthetic/reaction to anesthetic medications.  She understands the risks, any and all questions were answered to the patient's satisfaction.    Face-to-face time spent with the patient and accompanying care providers(if present) was 20 minutes, with more than 50% of the time spent counseling, educating, and coordinating care of the patient.    These notes generated with voice recognition software. I apologize for typographical errors.  Campbell Lerner M.D., FACS 08/16/2023, 10:38 PM

## 2023-08-17 ENCOUNTER — Ambulatory Visit: Payer: BC Managed Care – PPO | Admitting: Surgery

## 2023-08-23 ENCOUNTER — Encounter (HOSPITAL_COMMUNITY): Payer: Self-pay | Admitting: Clinical

## 2023-08-23 ENCOUNTER — Ambulatory Visit (INDEPENDENT_AMBULATORY_CARE_PROVIDER_SITE_OTHER): Payer: BC Managed Care – PPO | Admitting: Clinical

## 2023-08-23 DIAGNOSIS — Z91198 Patient's noncompliance with other medical treatment and regimen for other reason: Secondary | ICD-10-CM

## 2023-08-23 NOTE — Progress Notes (Signed)
 Penny Gonzalez was scheduled to attend group therapy on 08/23/23 from 5:00-6:00pm.  A MyChart message was sent to them to remind them that this group is held via [employer] instead of through [employer].  An email was sent with the same reminder, as well as the link to the meeting on [employer].  Penny Gonzalez did not show up for the group despite these reminders. After the one group she attended, she indicated she did not want to return, but would try it one more time.  At that time CSW did not remove her name from the roster, but likely should have.  There will be a no charge code.  Encounter Diagnosis  Name Primary?   Failure to attend appointment with reason given Yes     Ambrose Mantle, LCSW 08/23/2023, 6:19 PM

## 2023-08-24 ENCOUNTER — Ambulatory Visit: Admitting: Surgery

## 2023-08-24 NOTE — Progress Notes (Deleted)
 Patient ID: Penny Gonzalez, female   DOB: 1996/04/07, 28 y.o.   MRN: [id number]  Chief Complaint: Desires tubal ligation, and same anesthetic for ventral hernia repair  History of Present Illness Penny Gonzalez is a 28 y.o. female with a history of the development of a palpable abnormality to the right of her umbilical area during her last pregnancy.  Bothered her somewhat during that pregnancy, and became more painful/troublesome with lifting her newborn with the car seat.  She notes there is a palpable area to the right of her umbilical area where likely there was an incision for her gallbladder surgery.  She does not know of a persistent bulge or mass in the area.  However she is anticipating surgery, for a tubal ligation and would like to combine procedures in the same event.  CT scan revealed a small ventral defect at the site of concern.  Considering the laxity of her abdomen and the smallness of her hernia.  This is still hard to appreciate on exam.  Past Medical History Past Medical History:  Diagnosis Date   Anemia, iron deficiency, inadequate dietary intake 12/01/2014   Depression    patient has not been officially diagnosed but has symptoms   Diabetes mellitus without complication (HCC)    Recently changed to Glyburide and thinks it is helping   Essential hypertension, benign 03/31/2016   Fetal macrosomia affecting management of mother, antepartum 10/16/2022   History of pre-eclampsia in prior pregnancy, currently pregnant 05/11/2022   History of preterm delivery, currently pregnant 05/11/2022   Irregular periods/menstrual cycles    Joint pain of leg    Major depression 11/23/2015   Obesity in pregnancy 11/29/2017   Obesity in pregnancy, antepartum 04/22/2022   Positive GBS test 10/06/2022   Pre-existing diabetes mellitus during pregnancy in third trimester 06/24/2014   Pre-existing diabetes mellitus in pregnancy 10/15/2022   Pre-existing essential hypertension during  pregnancy in third trimester 09/30/2022   Preexisting diabetes complicating pregnancy, antepartum 07/27/2022   Pregnancy with type 2 diabetes mellitus in first trimester 04/22/2022   Sciatica    Supervision of normal pregnancy 04/19/2022   28 y.o. [id number], at [redacted]w[redacted]d based on LMP of 01/29/22, with an Estimated Date of Delivery: 11/05/22.     Sex of baby and name:  " "   Partner:    Ethelene Browns     Factors complicating this pregnancy   Deliveries at 17 week x2     Obesity BMI:   Baseline labs:  Early 1 hour GTT: n/a  P/C ratio:  79  CMP: wnl  A1c: 6.5     H/o Preeclampsia,   ASA at 12 weeks (d/c 35-37 weeks) - discussed 04/21/22     Diabetes       Past Surgical History:  Procedure Laterality Date   CHOLECYSTECTOMY N/A 05/19/2016   Procedure: LAPAROSCOPIC CHOLECYSTECTOMY WITH INTRAOPERATIVE CHOLANGIOGRAM;  Surgeon: Nadeen Landau, MD;  Location: [facility] ORS;  Service: General;  Laterality: N/A;    Allergies  Allergen Reactions   Latex Rash    Pruritic, urticaria    Current Outpatient Medications  Medication Sig Dispense Refill   Semaglutide (RYBELSUS) 7 MG TABS Take 1 tablet (7 mg total) by mouth daily before breakfast. 30 tablet 3   No current facility-administered medications for this visit.    Family History Family History  Problem Relation Age of Onset   Diabetes Mother    Arthritis Father    Diabetes Father    Heart disease Father  Hypertension Father    Stroke Father    Lupus Sister    Deafness Sister    Diabetes Maternal Grandmother    Hypertension Maternal Grandmother    Heart disease Paternal Grandmother    Hypertension Paternal Grandmother       Social History Social History   Tobacco Use   Smoking status: Never    Passive exposure: Never   Smokeless tobacco: Never  Vaping Use   Vaping status: Never Used  Substance Use Topics   Alcohol use: Not Currently    Comment: OCC   Drug use: No        Review of Systems  Constitutional: Negative.   HENT:  Negative.    Eyes: Negative.   Respiratory: Negative.    Cardiovascular: Negative.   Gastrointestinal:  Positive for abdominal pain and blood in stool.  Genitourinary: Negative.   Skin: Negative.   Neurological:  Positive for headaches.  Psychiatric/Behavioral: Negative.       Physical Exam currently breastfeeding.    CONSTITUTIONAL: Well developed, and nourished, appropriately responsive and aware without distress.   EYES: Sclera non-icteric.   EARS, NOSE, MOUTH AND THROAT:  The oropharynx is clear. Oral mucosa is pink and moist.    Hearing is intact to voice.  NECK: Trachea is midline, and there is no jugular venous distension.  LYMPH NODES:  Lymph nodes in the neck are not appreciated. RESPIRATORY:  Lungs are clear, and breath sounds are equal bilaterally.  Normal respiratory effort without pathologic use of accessory muscles. CARDIOVASCULAR: Heart is regular in rate and rhythm.   Well perfused.  GI: The abdomen is consistent with postpartum abdominal wall laxity, some evidence of weight loss with soft tissue laxity as well.  This makes identifying a fascial defect somewhat challenging.  There is some palpable scarring near the the umbilical area, some potential of eventration, question surgical scarring versus fascial defect.  Remainder the abdomen is soft, nontender, and nondistended.    MUSCULOSKELETAL:  Symmetrical muscle tone appreciated in all four extremities.    SKIN: Skin turgor is normal. No pathologic skin lesions appreciated.  NEUROLOGIC:  Motor and sensation appear grossly normal.  Cranial nerves are grossly without defect. PSYCH:  Alert and oriented to person, place and time. Affect is appropriate for situation.  Data Reviewed I have personally reviewed what is currently available of the patient's imaging, recent labs and medical records.   Labs:     Latest Ref Rng & Units 04/10/2023    3:51 PM 12/22/2022   10:18 AM 10/16/2022    6:46 AM  CBC  WBC 3.4 - 10.8  x10E3/uL 4.8  5.0  9.0   Hemoglobin 11.1 - 15.9 g/dL 16.1  09.6  04.5   Hematocrit 34.0 - 46.6 % 39.8  39.5  31.0   Platelets 150 - 450 x10E3/uL 286   183       Latest Ref Rng & Units 04/10/2023    3:51 PM 12/22/2022   10:18 AM 10/15/2022   12:52 AM  CMP  Glucose 70 - 99 mg/dL 76  79  409   BUN 6 - 20 mg/dL 9  5  9    Creatinine 0.57 - 1.00 mg/dL 8.11  9.14  7.82   Sodium 134 - 144 mmol/L 142  140  133   Potassium 3.5 - 5.2 mmol/L 4.1  3.9  4.3   Chloride 96 - 106 mmol/L 100  101  105   CO2 20 - 29 mmol/L 25  22  19   Calcium 8.7 - 10.2 mg/dL 9.5  9.2  9.0   Total Protein 6.0 - 8.5 g/dL 6.7  7.0    Total Bilirubin 0.0 - 1.2 mg/dL 0.3  0.5    Alkaline Phos 44 - 121 IU/L 117  155    AST 0 - 40 IU/L 15  15    ALT 0 - 32 IU/L 13  15        Imaging: Radiological images reviewed:  CLINICAL DATA:  Possible umbilical hernia.   EXAM: CT ABDOMEN AND PELVIS WITH CONTRAST   TECHNIQUE: Multidetector CT imaging of the abdomen and pelvis was performed using the standard protocol following bolus administration of intravenous contrast.   RADIATION DOSE REDUCTION: This exam was performed according to the departmental dose-optimization program which includes automated exposure control, adjustment of the mA and/or kV according to patient size and/or use of iterative reconstruction technique.   CONTRAST:  OMNIPAQUE IOHEXOL 300 MG/ML  SOLN   COMPARISON:  MRI abdomen March 05, 2014   FINDINGS: Lower chest: No acute abnormality.   Hepatobiliary: No suspicious hepatic lesion. Gallbladder surgically absent. No biliary ductal dilation.   Pancreas: No pancreatic ductal dilation or evidence of acute inflammation.   Spleen: No splenomegaly.   Adrenals/Urinary Tract: Bilateral adrenal glands appear normal. No hydronephrosis. Kidneys demonstrate symmetric enhancement. Urinary bladder is unremarkable for degree of distension.   Stomach/Bowel: No radiopaque enteric contrast  material was administered. Stomach is minimally distended limiting evaluation. No pathologic dilation of small or large bowel. Noninflamed appendix. No evidence of acute bowel inflammation.   Vascular/Lymphatic: Normal caliber abdominal aorta. Smooth IVC contours. Mixing artifact in the IVC at the level of the renal veins. The portal, splenic and superior mesenteric veins are patent. No pathologically enlarged abdominal or pelvic lymph nodes.   Reproductive: Uterus and bilateral adnexa are unremarkable.   Other: Tiny fat containing umbilical hernia.   Musculoskeletal: No acute osseous abnormality.   IMPRESSION: Tiny fat containing umbilical hernia.     Electronically Signed   By: Maudry Mayhew M.D.   On: 05/20/2023 08:44   Assessment    Possible incisional hernia in periumbilical area, difficult to readily appreciate fascial defect size. Patient Active Problem List   Diagnosis Date Noted   BMI 31.0-31.9,adult 06/03/2023   Umbilical hernia without obstruction and without gangrene 05/30/2023   Umbilical pain 05/04/2023   B12 deficiency due to diet 12/31/2022   Unwanted fertility 07/27/2022   History of pre-eclampsia 04/22/2022   Acanthosis nigricans 06/27/2017   Fatty liver 05/07/2016   Essential hypertension, benign 03/31/2016   Proteinuria 01/30/2016   Vitamin D deficiency 11/19/2015   Other fatigue 11/18/2015   Type 2 diabetes mellitus with hyperglycemia, without long-term current use of insulin (HCC) 11/26/2014   Amenorrhea 11/26/2014    Plan    The small fascial defect will likely be a good access for a midline laparoscopic trocar/port will coordinate/plan further steps with Dr. Oretha Milch office anticipating possible surgery. The size of the defect will likely be amenable to suture repair, I do not appreciate any need for consideration of any fascial/mesh reinforcement.  I discussed unlikely possibility of incarceration, strangulation, enlargement in size over  time, and the risk of emergency surgery in the face of strangulation.  Also discussed the risk of surgery including recurrence which can be up to 30% in the case of complex hernias, unlikely use of prosthetic materials (mesh) along with the increased risk of infxn, post-op infxn and the possible need  for re-operation and removal of mesh if used, and the risks of general anesthetic/reaction to anesthetic medications.  She understands the risks, any and all questions were answered to the patient's satisfaction.   Face-to-face time spent with the patient and accompanying care providers(if present) was 20 minutes, with more than 50% of the time spent counseling, educating, and coordinating care of the patient.    These notes generated with voice recognition software. I apologize for typographical errors.  Campbell Lerner M.D., FACS 08/24/2023, 9:56 AM

## 2023-08-28 ENCOUNTER — Encounter: Payer: Self-pay | Admitting: Family

## 2023-08-28 ENCOUNTER — Ambulatory Visit (INDEPENDENT_AMBULATORY_CARE_PROVIDER_SITE_OTHER): Admitting: Family

## 2023-08-28 VITALS — BP 116/78 | HR 108 | Ht 68.0 in | Wt 218.0 lb

## 2023-08-28 DIAGNOSIS — E1165 Type 2 diabetes mellitus with hyperglycemia: Secondary | ICD-10-CM

## 2023-08-28 DIAGNOSIS — E559 Vitamin D deficiency, unspecified: Secondary | ICD-10-CM

## 2023-08-28 DIAGNOSIS — Z202 Contact with and (suspected) exposure to infections with a predominantly sexual mode of transmission: Secondary | ICD-10-CM

## 2023-08-28 DIAGNOSIS — L732 Hidradenitis suppurativa: Secondary | ICD-10-CM | POA: Insufficient documentation

## 2023-08-28 DIAGNOSIS — I10 Essential (primary) hypertension: Secondary | ICD-10-CM | POA: Diagnosis not present

## 2023-08-28 DIAGNOSIS — E782 Mixed hyperlipidemia: Secondary | ICD-10-CM

## 2023-08-28 DIAGNOSIS — E611 Iron deficiency: Secondary | ICD-10-CM

## 2023-08-28 DIAGNOSIS — R5383 Other fatigue: Secondary | ICD-10-CM

## 2023-08-28 DIAGNOSIS — E538 Deficiency of other specified B group vitamins: Secondary | ICD-10-CM

## 2023-08-28 MED ORDER — DOXYCYCLINE HYCLATE 100 MG PO CAPS: 100.0000 mg | ORAL_CAPSULE | Freq: Two times a day (BID) | ORAL | 0 refills | Status: DC

## 2023-08-28 NOTE — Progress Notes (Signed)
 Acute Office Visit  Subjective:     Patient ID: Penny Gonzalez, female    DOB: Nov 03, 1995, 28 y.o.   MRN: [id number]  Patient is in today for  Chief Complaint  Patient presents with   Acute Visit    Boils in left armpit area    Patient is here today with a few concerns: She has boils again in left underarm.  Patient has these on and off. Last exacerbation of the condition was when she was pregnant. She is asking if we can set up a referral for dermatology  Also asks if we can run STI labs today.     Review of Systems  All other systems reviewed and are negative.       Objective:    BP 116/78   Pulse (!) 108   Ht 5\' 8"  (1.727 m)   Wt 218 lb (98.9 kg)   SpO2 99%   BMI 33.15 kg/m   Physical Exam Vitals and nursing note reviewed.  Constitutional:      Appearance: Normal appearance. She is normal weight.  HENT:     Head: Normocephalic.  Eyes:     Extraocular Movements: Extraocular movements intact.     Conjunctiva/sclera: Conjunctivae normal.     Pupils: Pupils are equal, round, and reactive to light.  Cardiovascular:     Rate and Rhythm: Normal rate.  Pulmonary:     Effort: Pulmonary effort is normal.  Neurological:     General: No focal deficit present.     Mental Status: She is alert and oriented to person, place, and time. Mental status is at baseline.  Psychiatric:        Mood and Affect: Mood normal.        Behavior: Behavior normal.        Thought Content: Thought content normal.        Judgment: Judgment normal.     No results found for any visits on 08/28/23.  No results found for this or any previous visit (from the past 2160 hours).  Allergies as of 08/28/2023       Reactions   Latex Rash   Pruritic, urticaria        Medication List        Accurate as of August 28, 2023  4:21 PM. If you have any questions, ask your nurse or doctor.          doxycycline 100 MG capsule Commonly known as: VIBRAMYCIN Take 1 capsule (100  mg total) by mouth 2 (two) times daily. Started by: Miki Kins   Rybelsus 7 MG Tabs Generic drug: Semaglutide Take 1 tablet (7 mg total) by mouth daily before breakfast.            Assessment & Plan:   Problem List Items Addressed This Visit       Cardiovascular and Mediastinum   Essential hypertension, benign (Chronic)   Blood pressure well controlled with current medications.  Continue current therapy.  Will reassess at follow up.        Relevant Orders   CMP14+EGFR   CBC with Diff     Endocrine   Type 2 diabetes mellitus with hyperglycemia, without long-term current use of insulin (HCC)   Checking labs today. Will call pt. With results  Continue current diabetes POC, as patient has been well controlled on current regimen.  Will adjust meds if needed based on labs.        Relevant Orders  CMP14+EGFR   Hemoglobin A1c   CBC with Diff     Musculoskeletal and Integument   Hidradenitis suppurativa of left axilla - Primary   Sending antibiotics for patient. Now sending referral to dermatology as well.      Relevant Orders   CMP14+EGFR   CBC with Diff   Ambulatory referral to Dermatology     Other   Other fatigue   Relevant Orders   CMP14+EGFR   TSH   CBC with Diff   Vitamin D deficiency   Checking labs today.  Will continue supplements as needed.        Relevant Orders   VITAMIN D 25 Hydroxy (Vit-D Deficiency, Fractures)   CMP14+EGFR   CBC with Diff   B12 deficiency due to diet   Checking labs today.  Will continue supplements as needed.        Relevant Orders   CMP14+EGFR   Vitamin B12   CBC with Diff   Other Visit Diagnoses       Mixed hyperlipidemia       Checking labs today.  Continue current therapy for lipid control. Will modify as needed based on labwork results.   Relevant Orders   Lipid panel   CMP14+EGFR   CBC with Diff     Iron deficiency       Checking labs today.  Will continue supplements as needed.    Relevant Orders   Iron, TIBC and Ferritin Panel     Possible exposure to STI       Test ordered in office today. Will call with results.   Relevant Orders   NuSwab VG+, HSV   HIV antibody (with reflex)        Return in about 1 month (around 09/28/2023) for F/U.  Total time spent: 20 minutes  Miki Kins, FNP  08/28/2023   This document may have been prepared by [facility] Voice Recognition software and as such may include unintentional dictation errors.

## 2023-08-28 NOTE — Assessment & Plan Note (Signed)
 Sending antibiotics for patient. Now sending referral to dermatology as well.

## 2023-08-28 NOTE — Assessment & Plan Note (Signed)
 Blood pressure well controlled with current medications.  Continue current therapy.  Will reassess at follow up.

## 2023-08-28 NOTE — Assessment & Plan Note (Signed)
 Checking labs today. Will call pt. With results  Continue current diabetes POC, as patient has been well controlled on current regimen.  Will adjust meds if needed based on labs.

## 2023-08-28 NOTE — Assessment & Plan Note (Signed)
 Checking labs today.  Will continue supplements as needed.

## 2023-08-29 LAB — CBC WITH DIFFERENTIAL/PLATELET
Basophils Absolute: 0 10*3/uL (ref 0.0–0.2)
Basos: 0 %
EOS (ABSOLUTE): 0.1 10*3/uL (ref 0.0–0.4)
Eos: 1 %
Hematocrit: 37.8 % (ref 34.0–46.6)
Hemoglobin: 12.3 g/dL (ref 11.1–15.9)
Immature Grans (Abs): 0 10*3/uL (ref 0.0–0.1)
Immature Granulocytes: 1 %
Lymphocytes Absolute: 2 10*3/uL (ref 0.7–3.1)
Lymphs: 36 %
MCH: 27.2 pg (ref 26.6–33.0)
MCHC: 32.5 g/dL (ref 31.5–35.7)
MCV: 84 fL (ref 79–97)
Monocytes Absolute: 0.3 10*3/uL (ref 0.1–0.9)
Monocytes: 4 %
Neutrophils Absolute: 3.3 10*3/uL (ref 1.4–7.0)
Neutrophils: 58 %
Platelets: 306 10*3/uL (ref 150–450)
RBC: 4.52 x10E6/uL (ref 3.77–5.28)
RDW: 14.7 % (ref 11.7–15.4)
WBC: 5.7 10*3/uL (ref 3.4–10.8)

## 2023-08-29 LAB — CMP14+EGFR
ALT: 15 IU/L (ref 0–32)
AST: 17 IU/L (ref 0–40)
Albumin: 4.3 g/dL (ref 4.0–5.0)
Alkaline Phosphatase: 81 IU/L (ref 44–121)
BUN/Creatinine Ratio: 11 (ref 9–23)
BUN: 6 mg/dL (ref 6–20)
Bilirubin Total: <0.2 mg/dL (ref 0.0–1.2)
CO2: 25 mmol/L (ref 20–29)
Calcium: 9.2 mg/dL (ref 8.7–10.2)
Chloride: 102 mmol/L (ref 96–106)
Creatinine, Ser: 0.57 mg/dL (ref 0.57–1.00)
Globulin, Total: 2.8 g/dL (ref 1.5–4.5)
Glucose: 127 mg/dL — ABNORMAL HIGH (ref 70–99)
Potassium: 3.9 mmol/L (ref 3.5–5.2)
Sodium: 141 mmol/L (ref 134–144)
Total Protein: 7.1 g/dL (ref 6.0–8.5)
eGFR: 125 mL/min/{1.73_m2} (ref >59)

## 2023-08-29 LAB — LIPID PANEL
Chol/HDL Ratio: 2.7 ratio (ref 0.0–4.4)
Cholesterol, Total: 142 mg/dL (ref 100–199)
HDL: 53 mg/dL (ref >39)
LDL Chol Calc (NIH): 66 mg/dL (ref 0–99)
Triglycerides: 131 mg/dL (ref 0–149)
VLDL Cholesterol Cal: 23 mg/dL (ref 5–40)

## 2023-08-29 LAB — HEMOGLOBIN A1C
Est. average glucose Bld gHb Est-mCnc: 126 mg/dL
Hgb A1c MFr Bld: 6 % — ABNORMAL HIGH (ref 4.8–5.6)

## 2023-08-29 LAB — IRON,TIBC AND FERRITIN PANEL
Ferritin: 40 ng/mL (ref 15–150)
Iron Saturation: 11 % — ABNORMAL LOW (ref 15–55)
Iron: 41 ug/dL (ref 27–159)
Total Iron Binding Capacity: 373 ug/dL (ref 250–450)
UIBC: 332 ug/dL (ref 131–425)

## 2023-08-29 LAB — TSH: TSH: 0.405 u[IU]/mL — ABNORMAL LOW (ref 0.450–4.500)

## 2023-08-29 LAB — VITAMIN B12: Vitamin B-12: 681 pg/mL (ref 232–1245)

## 2023-08-29 LAB — VITAMIN D 25 HYDROXY (VIT D DEFICIENCY, FRACTURES): Vit D, 25-Hydroxy: 17 ng/mL — ABNORMAL LOW (ref 30.0–100.0)

## 2023-08-30 LAB — HIV ANTIBODY (ROUTINE TESTING W REFLEX): HIV Screen 4th Generation wRfx: NONREACTIVE

## 2023-08-31 LAB — NUSWAB VG+, HSV
Candida albicans, NAA: NEGATIVE
Candida glabrata, NAA: NEGATIVE
Chlamydia trachomatis, NAA: NEGATIVE
HSV 1 NAA: NEGATIVE
HSV 2 NAA: NEGATIVE
Neisseria gonorrhoeae, NAA: NEGATIVE
Trich vag by NAA: NEGATIVE

## 2023-09-19 ENCOUNTER — Other Ambulatory Visit: Payer: Self-pay

## 2023-09-19 MED ORDER — VITAMIN D (ERGOCALCIFEROL) 1.25 MG (50000 UNIT) PO CAPS: 50000.0000 [IU] | ORAL_CAPSULE | ORAL | 3 refills | Status: DC

## 2023-09-26 ENCOUNTER — Ambulatory Visit: Admitting: Family

## 2023-09-27 ENCOUNTER — Encounter: Payer: Self-pay | Admitting: Family

## 2023-09-27 ENCOUNTER — Ambulatory Visit (INDEPENDENT_AMBULATORY_CARE_PROVIDER_SITE_OTHER): Admitting: Family

## 2023-09-27 VITALS — BP 122/82 | HR 93 | Ht 68.0 in | Wt 221.2 lb

## 2023-09-27 DIAGNOSIS — R946 Abnormal results of thyroid function studies: Secondary | ICD-10-CM | POA: Diagnosis not present

## 2023-09-27 DIAGNOSIS — E1165 Type 2 diabetes mellitus with hyperglycemia: Secondary | ICD-10-CM | POA: Diagnosis not present

## 2023-09-27 DIAGNOSIS — Z0130 Encounter for examination of blood pressure without abnormal findings: Secondary | ICD-10-CM

## 2023-09-27 LAB — GLUCOSE, POCT (MANUAL RESULT ENTRY): POC Glucose: 124 mg/dL — AB (ref 70–99)

## 2023-09-27 NOTE — Progress Notes (Signed)
 Established Patient Office Visit  Subjective:  Patient ID: Penny Gonzalez, female    DOB: 29-Oct-1995  Age: 28 y.o. MRN: [id number]  Chief Complaint  Patient presents with   Follow-up    Lab results    Patient here today to discuss her bloodwork.  She had labs drawn at last appointment, but she had a few things wrong, so we had asked her to set up an appointment.   Her iron saturation was still low, Vitamin D was low as well, and her TSH was low too. However, she found out right after her last lab draw that she was pregnant again, so she says that she thinks this may have caused her labs to be abnormal.   No other concerns at this time.   Past Medical History:  Diagnosis Date   Anemia, iron deficiency, inadequate dietary intake 12/01/2014   Depression    patient has not been officially diagnosed but has symptoms   Diabetes mellitus without complication (HCC)    Recently changed to Glyburide and thinks it is helping   Essential hypertension, benign 03/31/2016   Fetal macrosomia affecting management of mother, antepartum 10/16/2022   History of pre-eclampsia in prior pregnancy, currently pregnant 05/11/2022   History of preterm delivery, currently pregnant 05/11/2022   Irregular periods/menstrual cycles    Joint pain of leg    Major depression 11/23/2015   Obesity in pregnancy 11/29/2017   Obesity in pregnancy, antepartum 04/22/2022   Positive GBS test 10/06/2022   Pre-existing diabetes mellitus during pregnancy in third trimester 06/24/2014   Pre-existing diabetes mellitus in pregnancy 10/15/2022   Pre-existing essential hypertension during pregnancy in third trimester 09/30/2022   Preexisting diabetes complicating pregnancy, antepartum 07/27/2022   Pregnancy with type 2 diabetes mellitus in first trimester 04/22/2022   Sciatica    Supervision of normal pregnancy 04/19/2022   28 y.o. [id number], at [redacted]w[redacted]d based on LMP of 01/29/22, with an Estimated Date of Delivery: 11/05/22.      Sex of baby and name:  " "   Partner:    Ethelene Browns     Factors complicating this pregnancy   Deliveries at 57 week x2     Obesity BMI:   Baseline labs:  Early 1 hour GTT: n/a  P/C ratio:  79  CMP: wnl  A1c: 6.5     H/o Preeclampsia,   ASA at 12 weeks (d/c 35-37 weeks) - discussed 04/21/22     Diabetes     Past Surgical History:  Procedure Laterality Date   CHOLECYSTECTOMY N/A 05/19/2016   Procedure: LAPAROSCOPIC CHOLECYSTECTOMY WITH INTRAOPERATIVE CHOLANGIOGRAM;  Surgeon: Nadeen Landau, MD;  Location: [facility] ORS;  Service: General;  Laterality: N/A;    Social History   Socioeconomic History   Marital status: Single    Spouse name: Not on file   Number of children: 1   Years of education: 13   Highest education level: High school graduate  Occupational History   Occupation: [employer]  Tobacco Use   Smoking status: Never    Passive exposure: Never   Smokeless tobacco: Never  Vaping Use   Vaping status: Never Used  Substance and Sexual Activity   Alcohol use: Not Currently    Comment: OCC   Drug use: No   Sexual activity: Yes    Partners: Male    Birth control/protection: Surgical    Comment: tubal  Other Topics Concern   Not on file  Social History Narrative   Not on file  Social Drivers of Corporate investment banker Strain: Low Risk  (04/28/2022)   Overall Financial Resource Strain (CARDIA)    Difficulty of Paying Living Expenses: Not hard at all  Food Insecurity: No Food Insecurity (10/15/2022)   Hunger Vital Sign    Worried About Running Out of Food in the Last Year: Never true    Ran Out of Food in the Last Year: Never true  Transportation Needs: No Transportation Needs (10/15/2022)   PRAPARE - Administrator, Civil Service (Medical): No    Lack of Transportation (Non-Medical): No  Physical Activity: Inactive (04/28/2022)   Exercise Vital Sign    Days of Exercise per Week: 0 days    Minutes of Exercise per Session: 0 min  Stress: No Stress Concern  Present (04/28/2022)   [employer] of Occupational Health - Occupational Stress Questionnaire    Feeling of Stress : Only a little  Social Connections: Moderately Integrated (04/28/2022)   Social Connection and Isolation Panel [NHANES]    Frequency of Communication with Friends and Family: More than three times a week    Frequency of Social Gatherings with Friends and Family: Once a week    Attends Religious Services: 1 to 4 times per year    Active Member of [employer] or Organizations: No    Attends Banker Meetings: Never    Marital Status: Living with partner  Intimate Partner Violence: Unknown (10/15/2022)   Humiliation, Afraid, Rape, and Kick questionnaire    Fear of Current or Ex-Partner: No    Emotionally Abused: No    Physically Abused: Not on file    Sexually Abused: Not on file    Family History  Problem Relation Age of Onset   Diabetes Mother    Arthritis Father    Diabetes Father    Heart disease Father    Hypertension Father    Stroke Father    Lupus Sister    Deafness Sister    Diabetes Maternal Grandmother    Hypertension Maternal Grandmother    Heart disease Paternal Grandmother    Hypertension Paternal Grandmother     Allergies  Allergen Reactions   Latex Rash    Pruritic, urticaria    Review of Systems  All other systems reviewed and are negative.      Objective:   BP 122/82   Pulse 93   Ht 5\' 8"  (1.727 m)   Wt 221 lb 3.2 oz (100.3 kg)   SpO2 99%   BMI 33.63 kg/m   Vitals:   09/27/23 1119  BP: 122/82  Pulse: 93  Height: 5\' 8"  (1.727 m)  Weight: 221 lb 3.2 oz (100.3 kg)  SpO2: 99%  BMI (Calculated): 33.64    Physical Exam Vitals and nursing note reviewed.  Constitutional:      Appearance: Normal appearance. She is normal weight.  HENT:     Head: Normocephalic.  Eyes:     Extraocular Movements: Extraocular movements intact.     Conjunctiva/sclera: Conjunctivae normal.     Pupils: Pupils are equal, round, and  reactive to light.  Cardiovascular:     Rate and Rhythm: Normal rate.  Pulmonary:     Effort: Pulmonary effort is normal.  Neurological:     General: No focal deficit present.     Mental Status: She is alert and oriented to person, place, and time. Mental status is at baseline.  Psychiatric:        Mood and Affect: Mood normal.  Behavior: Behavior normal.        Thought Content: Thought content normal.      Results for orders placed or performed in visit on 09/27/23  POCT Glucose (CBG)  Result Value Ref Range   POC Glucose 124 (A) 70 - 99 mg/dl    Recent Results (from the past 2160 hours)  Lipid panel     Status: None   Collection Time: 08/28/23  1:59 PM  Result Value Ref Range   Cholesterol, Total 142 100 - 199 mg/dL   Triglycerides 409 0 - 149 mg/dL   HDL 53 >81 mg/dL   VLDL Cholesterol Cal 23 5 - 40 mg/dL   LDL Chol Calc (NIH) 66 0 - 99 mg/dL   Chol/HDL Ratio 2.7 0.0 - 4.4 ratio    Comment:                                   T. Chol/HDL Ratio                                             Men  Women                               1/2 Avg.Risk  3.4    3.3                                   Avg.Risk  5.0    4.4                                2X Avg.Risk  9.6    7.1                                3X Avg.Risk 23.4   11.0   VITAMIN D 25 Hydroxy (Vit-D Deficiency, Fractures)     Status: Abnormal   Collection Time: 08/28/23  1:59 PM  Result Value Ref Range   Vit D, 25-Hydroxy 17.0 (L) 30.0 - 100.0 ng/mL    Comment: Vitamin D deficiency has been defined by the Institute of Medicine and an Endocrine Society practice guideline as a level of serum 25-OH vitamin D less than 20 ng/mL (1,2). The Endocrine Society went on to further define vitamin D insufficiency as a level between 21 and 29 ng/mL (2). 1. IOM (Institute of Medicine). 2010. Dietary reference    intakes for calcium and D. [place]: The    [employer]. 2. Holick MF, Binkley Icard, Bischoff-Ferrari  HA, et al.    Evaluation, treatment, and prevention of vitamin D    deficiency: an Endocrine Society clinical practice    guideline. JCEM. 2011 Jul; 96(7):1911-30.   CMP14+EGFR     Status: Abnormal   Collection Time: 08/28/23  1:59 PM  Result Value Ref Range   Glucose 127 (H) 70 - 99 mg/dL   BUN 6 6 - 20 mg/dL   Creatinine, Ser 1.91 0.57 - 1.00 mg/dL   eGFR 478 >29 FA/OZH/0.86   BUN/Creatinine Ratio 11 9 - 23   Sodium 141 134 -  144 mmol/L   Potassium 3.9 3.5 - 5.2 mmol/L   Chloride 102 96 - 106 mmol/L   CO2 25 20 - 29 mmol/L   Calcium 9.2 8.7 - 10.2 mg/dL   Total Protein 7.1 6.0 - 8.5 g/dL   Albumin 4.3 4.0 - 5.0 g/dL   Globulin, Total 2.8 1.5 - 4.5 g/dL   Bilirubin Total <1.6 0.0 - 1.2 mg/dL   Alkaline Phosphatase 81 44 - 121 IU/L   AST 17 0 - 40 IU/L   ALT 15 0 - 32 IU/L  TSH     Status: Abnormal   Collection Time: 08/28/23  1:59 PM  Result Value Ref Range   TSH 0.405 (L) 0.450 - 4.500 uIU/mL  Hemoglobin A1c     Status: Abnormal   Collection Time: 08/28/23  1:59 PM  Result Value Ref Range   Hgb A1c MFr Bld 6.0 (H) 4.8 - 5.6 %    Comment:          Prediabetes: 5.7 - 6.4          Diabetes: >6.4          Glycemic control for adults with diabetes: <7.0    Est. average glucose Bld gHb Est-mCnc 126 mg/dL  Vitamin X09     Status: None   Collection Time: 08/28/23  1:59 PM  Result Value Ref Range   Vitamin B-12 681 232 - 1,245 pg/mL  CBC with Diff     Status: None   Collection Time: 08/28/23  1:59 PM  Result Value Ref Range   WBC 5.7 3.4 - 10.8 x10E3/uL   RBC 4.52 3.77 - 5.28 x10E6/uL   Hemoglobin 12.3 11.1 - 15.9 g/dL   Hematocrit 60.4 54.0 - 46.6 %   MCV 84 79 - 97 fL   MCH 27.2 26.6 - 33.0 pg   MCHC 32.5 31.5 - 35.7 g/dL   RDW 98.1 19.1 - 47.8 %   Platelets 306 150 - 450 x10E3/uL   Neutrophils 58 Not Estab. %   Lymphs 36 Not Estab. %   Monocytes 4 Not Estab. %   Eos 1 Not Estab. %   Basos 0 Not Estab. %   Neutrophils Absolute 3.3 1.4 - 7.0 x10E3/uL    Lymphocytes Absolute 2.0 0.7 - 3.1 x10E3/uL   Monocytes Absolute 0.3 0.1 - 0.9 x10E3/uL   EOS (ABSOLUTE) 0.1 0.0 - 0.4 x10E3/uL   Basophils Absolute 0.0 0.0 - 0.2 x10E3/uL   Immature Granulocytes 1 Not Estab. %   Immature Grans (Abs) 0.0 0.0 - 0.1 x10E3/uL  Iron, TIBC and Ferritin Panel     Status: Abnormal   Collection Time: 08/28/23  1:59 PM  Result Value Ref Range   Total Iron Binding Capacity 373 250 - 450 ug/dL   UIBC 295 621 - 308 ug/dL   Iron 41 27 - 657 ug/dL   Iron Saturation 11 (L) 15 - 55 %   Ferritin 40 15 - 150 ng/mL  NuSwab VG+, HSV     Status: None   Collection Time: 08/28/23  4:10 PM  Result Value Ref Range   Atopobium vaginae Low - 0 Score   BVAB 2 Low - 0 Score   Megasphaera 1 Low - 0 Score    Comment: Calculate total score by adding the 3 individual bacterial vaginosis (BV) marker scores together.  Total score is interpreted as follows: Total score 0-1: Indicates the absence of BV. Total score   2: Indeterminate for BV. Additional clinical  data should be evaluated to establish a                  diagnosis. Total score 3-6: Indicates the presence of BV.    Candida albicans, NAA Negative Negative   Candida glabrata, NAA Negative Negative   Trich vag by NAA Negative Negative   Chlamydia trachomatis, NAA Negative Negative   Neisseria gonorrhoeae, NAA Negative Negative   HSV 1 NAA Negative Negative   HSV 2 NAA Negative Negative  HIV antibody (with reflex)     Status: None   Collection Time: 08/28/23  4:11 PM  Result Value Ref Range   HIV Screen 4th Generation wRfx Non Reactive Non Reactive    Comment: HIV-1/HIV-2 antibodies and HIV-1 p24 antigen were NOT detected. There is no laboratory evidence of HIV infection. HIV Negative   POCT Glucose (CBG)     Status: Abnormal   Collection Time: 09/27/23 11:24 AM  Result Value Ref Range   POC Glucose 124 (A) 70 - 99 mg/dl       Assessment & Plan:   Problem List Items Addressed This Visit        Endocrine   Type 2 diabetes mellitus with hyperglycemia, without long-term current use of insulin (HCC) - Primary   Checking labs today. Will call pt. With results  Continue current diabetes POC, as patient has been well controlled on current regimen.  Will adjust meds if needed based on labs.        Relevant Orders   POCT Glucose (CBG) (Completed)   Other Visit Diagnoses       Abnormal thyroid function test       Will recheck TSH and add T3 and T4 as well today.  Will call with results and next steps.   Relevant Orders   TSH+T4F+T3Free       Return as previously scheduled.   Total time spent: 20 minutes  Trenda Frisk, FNP  09/27/2023   This document may have been prepared by [facility] Voice Recognition software and as such may include unintentional dictation errors.

## 2023-09-27 NOTE — Assessment & Plan Note (Signed)
 Checking labs today. Will call pt. With results  Continue current diabetes POC, as patient has been well controlled on current regimen.  Will adjust meds if needed based on labs.

## 2023-09-28 ENCOUNTER — Ambulatory Visit (HOSPITAL_COMMUNITY): Payer: BC Managed Care – PPO | Admitting: Clinical

## 2023-09-28 ENCOUNTER — Encounter (HOSPITAL_COMMUNITY): Payer: Self-pay | Admitting: Clinical

## 2023-09-28 DIAGNOSIS — Z630 Problems in relationship with spouse or partner: Secondary | ICD-10-CM | POA: Diagnosis not present

## 2023-09-28 DIAGNOSIS — F331 Major depressive disorder, recurrent, moderate: Secondary | ICD-10-CM | POA: Diagnosis not present

## 2023-09-28 LAB — TSH+T4F+T3FREE
Free T4: 1.1 ng/dL (ref 0.82–1.77)
T3, Free: 3 pg/mL (ref 2.0–4.4)
TSH: 0.566 u[IU]/mL (ref 0.450–4.500)

## 2023-09-28 NOTE — Progress Notes (Signed)
 THERAPIST PROGRESS NOTE  Session Time: 3:05-4:04pm  Session #11  Participation Level: Active  Behavioral Response: Well Groomed Alert Euthymic   Type of Therapy: Individual Therapy  Treatment Goals addressed:  New treatment goals established, current goals reviewed: LTG: Increase coping skills to manage depression and improve ability to perform daily activities  LTG: Natoshia will score less than 9 on the Patient Health Questionnaire (PHQ-9) LTG: Shaeley will score less than 5 on the Generalized Anxiety Disorder 7 Scale (GAD-7)  STG: Identify and decrease cognitive distortions contributing negatively to mood and behavior by identifying 5-7 cognitive distortions that are present; learn how to come up with replacement thoughts that are more balanced, realistic, and helpful.   LTG: Explore personal core beliefs, rules and assumptions, and cognitive distortions through therapist using Cognitive Behavioral Therapy; learn about Behavioral Activation and Acting As If. STG: Learn and practice communication techniques such as "I" statements, open-ended questions, reflective listening, assertiveness, fair fighting rules, active listening, initiating conversations, and more as necessary and taught in session.    STG: Learn about boundary types, how to implement them, and how to enforce them so that feels more empowered and content with being able to maintain more helpful, appropriate boundaries in the future for a more balanced result.    LTG: Learn breathing techniques and grounding techniques at an age-appropriate and ability-appropriate level and demonstrate mastery in session then report independent use of these skills out of session.    STG: Learn about detachment from loved one's negative behaviors to be healthier emotionally, physically, and spiritually herself and work to detach from loved ones' poor choices. STG:  Process life events to the extent needed so that can move forward with various  areas of life in a better frame of mind.    LTG: Work to Arts development officer from models like CBT, Stages of Change, DBT, shame resilience theory, ACT, SFBT, MI, trauma-informed therapy and others to be able to manage mental health symptoms, AEB practicing out of session and reporting back.    ProgressTowards Goals: Progressing  Interventions: DBT, Supportive, and Other: treatment planning    Summary: Penny Gonzalez is a 28 y.o. female who presents with post-partum depression and anxiety for therapy. She presented oriented x5 and stated she was feeling "like I've been going through a 360."  CSW evaluated patient's medication compliance, use of coping tools, and self-care, as applicable.  She provided an update on various aspects of her life that are normally discussed in therapy, including a job change, her 23th birthday, a falling out with her best friend, and her baby's father coming back to the home.   So far, she is not yet accustomed to the new at-home job so it is proving chaotic during the transition.  CSW normalized this to patient's benefit.  There was a significant disruption in life that occurred surrounding events that occurred on her birthday and she detailed these, received compassion and empathy to mitigate her shame.  She was able to take ex's pressure to become involved again and has told him that while their relationship is "on hold," he can stay in the living room or garage and be an active part of parenting the children.  We processed her various communication techniques and others were suggested.  Her best friend has become involved with a man that patient has had a negative reaction and although she does not like him, she is not trying to interfere with their relationship.  However, on patient's birthday her  best friend upended the plans and spent little time with patient.  Patient was emotional about this, angry and rigid in her thinking about it.  CSW provided psychoeducation  about states of mind, including emotional, rational, and wise minds.  She agreed that she can spend time in emotional mind and has done so recently.  CSW then taught TIPP skills to rapidly deescalate from emotional mind. This included: T - change temperature rapidly with cold water on the face or neck in order to lower heart rate and deescalate emotions quickly while engaging the parasympathetic nervous system to relax the body I - intense exercise to reduce the fight or flight reaction, increase heart rate in order to trick body into having less anxiety when the heart rate starts to lower at exercise cessation, and release endorphins to decrease negativity P - paced breathing such as 4-7-8 or boxed breathing to gain control over mind by focusing on something controllable, engage the parasympathetic nervous system, and relax the body P - paired muscle relaxation by squeezing muscle groups for 5-10 seconds then releasing them while engaging in the breathing exercises in order to release the tension in the body  The patient was receptive and stated these could be helpful exercises. At the end of session, she was provided with a pictorial handout to use as a reminder of the TIPP skill.   Suicidal/Homicidal: No without intent/plan  Therapist Response: Patient is progressing AEB engaging in scheduled therapy session.  Throughout the session, CSW gave patient the opportunity to explore thoughts and feelings associated with current life situations and past/present stressors.   CSW challenged patient gently and appropriately to consider different ways of looking at reported issues. CSW encouraged patient's expression of feelings and validated these using empathy, active listening, open body language, and unconditional positive regard.   CSW encouraged patient to schedule more therapy sessions for the future, as needed.      Plan/Recommendations:  Return to therapy at next scheduled appointment on 5/1, consider  practicing TIPP skills and Wise Mind taught in session  Diagnosis:  Major depressive disorder, recurrent episode, moderate degree (HCC)  Relationship problem between partners  Collaboration of Care: Primary Care Provider AEB - can see that patient is in therapy although may not be able to read notes  Patient/Guardian was advised Release of Information must be obtained prior to any record release in order to collaborate their care with an outside provider. Patient/Guardian was advised if they have not already done so to contact the registration department to sign all necessary forms in order for us  to release information regarding their care.   Consent: Patient/Guardian gives verbal consent for treatment and assignment of benefits for services provided during this visit. Patient/Guardian expressed understanding and agreed to proceed.   Ancel Kass, LCSW 09/28/2023

## 2023-10-02 ENCOUNTER — Ambulatory Visit: Admitting: Family

## 2023-10-12 ENCOUNTER — Ambulatory Visit (HOSPITAL_COMMUNITY): Payer: BC Managed Care – PPO | Admitting: Clinical

## 2023-10-12 ENCOUNTER — Encounter (HOSPITAL_COMMUNITY): Payer: Self-pay | Admitting: Clinical

## 2023-10-12 DIAGNOSIS — F331 Major depressive disorder, recurrent, moderate: Secondary | ICD-10-CM | POA: Diagnosis not present

## 2023-10-12 DIAGNOSIS — F419 Anxiety disorder, unspecified: Secondary | ICD-10-CM

## 2023-10-12 DIAGNOSIS — Z630 Problems in relationship with spouse or partner: Secondary | ICD-10-CM

## 2023-10-12 NOTE — Progress Notes (Signed)
 THERAPIST PROGRESS NOTE  Session Time: 3:05-4:00pm  Session #12  Participation Level: Active  Behavioral Response: Casual Alert Euthymic   Type of Therapy: Individual Therapy  Treatment Goals addressed:  LTG: Increase coping skills to manage depression and improve ability to perform daily activities  LTG: Shonterria will score less than 9 on the Patient Health Questionnaire (PHQ-9) LTG: Karai will score less than 5 on the Generalized Anxiety Disorder 7 Scale (GAD-7)  STG: Identify and decrease cognitive distortions contributing negatively to mood and behavior by identifying 5-7 cognitive distortions that are present; learn how to come up with replacement thoughts that are more balanced, realistic, and helpful.   LTG: Explore personal core beliefs, rules and assumptions, and cognitive distortions through therapist using Cognitive Behavioral Therapy; learn about Behavioral Activation and Acting As If. STG: Learn and practice communication techniques such as "I" statements, open-ended questions, reflective listening, assertiveness, fair fighting rules, active listening, initiating conversations, and more as necessary and taught in session.    STG: Learn about boundary types, how to implement them, and how to enforce them so that feels more empowered and content with being able to maintain more helpful, appropriate boundaries in the future for a more balanced result.    LTG: Learn breathing techniques and grounding techniques at an age-appropriate and ability-appropriate level and demonstrate mastery in session then report independent use of these skills out of session.    STG: Learn about detachment from loved one's negative behaviors to be healthier emotionally, physically, and spiritually herself and work to detach from loved ones' poor choices. STG:  Process life events to the extent needed so that can move forward with various areas of life in a better frame of mind.    LTG: Work to Clinical research associate from models like CBT, Stages of Change, DBT, shame resilience theory, ACT, SFBT, MI, trauma-informed therapy and others to be able to manage mental health symptoms, AEB practicing out of session and reporting back.    ProgressTowards Goals: Progressing  Interventions: CBT, Assertiveness Training, and Supportive   Summary: Penny Gonzalez is a 28 y.o. female who presents with post-partum depression and anxiety for therapy.  She presented oriented x5 and stated she was feeling "good."  CSW evaluated patient's medication compliance, use of coping tools, and self-care, as applicable.  She provided an update on various aspects of her life that are normally discussed in therapy, including her relationship with ex-significant other and her relationship with her best friend.  Her best friend is the godmother of her baby who will be turning 1yo in a few days, although throughout session she refers to her as "supposed godmother."  She is dissatisfied with this relationship, feeling that she puts more effort into it than her friend does, especially since the advent of her friend's current romantic relationship with somebody patient does not care for.  CSW challenged her gently to think about whether she is misinterpreting any of her friend's statements, to look at them through a CBT lens.  She stated she is not fortune telling, but was able to concede that it is her long-time relationship style to complete abandon any relationship that starts to feel bad.  CSW reminded her that much growth can take place and they can become even better once problems are resolved, as both parties have stronger communication and compassion skills.  Her son's father is still staying on the couch, as she has still not let him back into her room, but apparently their relationship  is not definitively ended, as he is asking her to attend couples therapy with him.  We talked about the need for both of them to have their own  individual therapists and a completely different person would work with them as a couple.  CSW taught her about how to use psychologytoday.com website to add filters and find an appropriate therapist.  A packet on boundary styles, boundary types, and how to set boundaries was provided to her and reviewed.   Suicidal/Homicidal: No without intent/plan  Therapist Response: Patient is progressing AEB engaging in scheduled therapy session.  Throughout the session, CSW gave patient the opportunity to explore thoughts and feelings associated with current life situations and past/present stressors.   CSW challenged patient gently and appropriately to consider different ways of looking at reported issues. CSW encouraged patient's expression of feelings and validated these using empathy, active listening, open body language, and unconditional positive regard.   CSW encouraged patient to schedule more therapy sessions for the future, as needed.      Plan/Recommendations:  Return to therapy at next scheduled appointment on 6/11, think about the cognitive distortions she might be using with regard to situation with best friend, keep working on boundaries with baby's father, look into couples therapy through [email or web address] as taught in session  Diagnosis:  Major depressive disorder, recurrent episode, moderate degree (HCC)  Relationship problem between partners  Anxiety disorder, unspecified type  Collaboration of Care: Primary Care Provider AEB - can see that patient is in therapy although may not be able to read notes  Patient/Guardian was advised Release of Information must be obtained prior to any record release in order to collaborate their care with an outside provider. Patient/Guardian was advised if they have not already done so to contact the registration department to sign all necessary forms in order for us  to release information regarding their care.   Consent: Patient/Guardian gives verbal  consent for treatment and assignment of benefits for services provided during this visit. Patient/Guardian expressed understanding and agreed to proceed.   Ancel Kass, LCSW 10/12/2023

## 2023-11-22 ENCOUNTER — Ambulatory Visit (INDEPENDENT_AMBULATORY_CARE_PROVIDER_SITE_OTHER): Admitting: Clinical

## 2023-11-22 ENCOUNTER — Encounter (HOSPITAL_COMMUNITY): Payer: Self-pay | Admitting: Clinical

## 2023-11-22 ENCOUNTER — Other Ambulatory Visit: Payer: Self-pay | Admitting: Family

## 2023-11-22 DIAGNOSIS — F331 Major depressive disorder, recurrent, moderate: Secondary | ICD-10-CM | POA: Diagnosis not present

## 2023-11-22 DIAGNOSIS — Z630 Problems in relationship with spouse or partner: Secondary | ICD-10-CM

## 2023-11-22 DIAGNOSIS — F419 Anxiety disorder, unspecified: Secondary | ICD-10-CM | POA: Diagnosis not present

## 2023-11-22 NOTE — Progress Notes (Signed)
 THERAPIST PROGRESS NOTE  Session Time: 9:03am-10:03am  Session #13  Virtual Visit via Video Note  I connected with Penny Gonzalez on 11/22/23 at  9:00 AM EDT by a video enabled telemedicine application and verified that I am speaking with the correct person using two identifiers.  Location: Patient: home Provider: home office   I discussed the limitations of evaluation and management by telemedicine and the availability of in person appointments. The patient expressed understanding and agreed to proceed.  I discussed the assessment and treatment plan with the patient. The patient was provided an opportunity to ask questions and all were answered. The patient agreed with the plan and demonstrated an understanding of the instructions.   The patient was advised to call back or seek an in-person evaluation if the symptoms worsen or if the condition fails to improve as anticipated.  I provided 60 minutes of non-face-to-face time during this encounter.   Ancel Kass, LCSW   Participation Level: Active  Behavioral Response: Casual Alert Negative   Type of Therapy: Individual Therapy  Treatment Goals addressed:  LTG: Increase coping skills to manage depression and improve ability to perform daily activities  LTG: Indigo will score less than 9 on the Patient Health Questionnaire (PHQ-9) LTG: Auriella will score less than 5 on the Generalized Anxiety Disorder 7 Scale (GAD-7)  STG: Identify and decrease cognitive distortions contributing negatively to mood and behavior by identifying 5-7 cognitive distortions that are present; learn how to come up with replacement thoughts that are more balanced, realistic, and helpful.   LTG: Explore personal core beliefs, rules and assumptions, and cognitive distortions through therapist using Cognitive Behavioral Therapy; learn about Behavioral Activation and Acting As If. STG: Learn and practice communication techniques such as I  statements, open-ended questions, reflective listening, assertiveness, fair fighting rules, active listening, initiating conversations, and more as necessary and taught in session.    STG: Learn about boundary types, how to implement them, and how to enforce them so that feels more empowered and content with being able to maintain more helpful, appropriate boundaries in the future for a more balanced result.    LTG: Learn breathing techniques and grounding techniques at an age-appropriate and ability-appropriate level and demonstrate mastery in session then report independent use of these skills out of session.    STG: Learn about detachment from loved one's negative behaviors to be healthier emotionally, physically, and spiritually herself and work to detach from loved ones' poor choices. STG:  Process life events to the extent needed so that can move forward with various areas of life in a better frame of mind.    LTG: Work to Arts development officer from models like CBT, Stages of Change, DBT, shame resilience theory, ACT, SFBT, MI, trauma-informed therapy and others to be able to manage mental health symptoms, AEB practicing out of session and reporting back.    ProgressTowards Goals: Progressing  Interventions: CBT and Supportive   Summary: Penny Gonzalez is a 28 y.o. female who presents with post-partum depression and anxiety for therapy.  She presented oriented x5 and stated she was feeling here, emotions all over the place.  CSW evaluated patient's medication compliance, use of coping tools, and self-care, as applicable.  She provided an update on various aspects of her life that are normally discussed in therapy, including couples therapy and problems with both best friends.  She and her significant other did fine in the first few couples therapy sessions, but there was tension in  the last one in which it came out that she is much less satisfied with almost every facet of the relationship  than he is.  She reiterated what she has previously shared, that the problem with her significant other is not what he says, but the way he says because he is often aggressive in his speech.  His mother kicked him out of her house at age 48yo so CSW pointed out that he has abandonment issues, which she acknowledged readily, saying it comes out a lot when he is demanding to know if she is going to put him out.  A few days ago, he left her a degrading message without meaning to and now she is very focused on that being the true way he thinks of her.  She said being with him is a roller coaster and even her daughter is reluctant to ask him to do simple things for her because of fear he will react poorly.  As she shared about both her best friends backing away, CSW explored with her the way she goes MIA in relationships based on things being negative and a cognitive distortion of fortune telling, mind reading, emotional reasoning and catastrophizing all rolled together that convinces her things cannot ever be good again.  We talked about what is more important, the journey or the destination, to which she answered The destination.  CSW challenged this thought with comparisons to date of birth and date of death and the dash in between, with the lifetime being the most important part.  This was brought to rest in talking about all three of these relationships, that the journey has something meaningful and positive to offer even if the destination is not that they end up in the same relationship to each other.  She agreed to think about all this.  Suicidal/Homicidal: No without intent/plan  Therapist Response: Patient is progressing AEB engaging in scheduled therapy session.  Throughout the session, CSW gave patient the opportunity to explore thoughts and feelings associated with current life situations and past/present stressors.   CSW challenged patient gently and appropriately to consider different ways of  looking at reported issues. CSW encouraged patient's expression of feelings and validated these using empathy, active listening, open body language, and unconditional positive regard.   More appointments were scheduled and she likes the virtual format.      Plan/Recommendations:  Return to therapy at next scheduled appointment on 6/26, think about the cognitive distortions she uses to assume she knows the end point of relationships in her life before the end point even arrives  Diagnosis:  Major depressive disorder, recurrent episode, moderate degree (HCC)  Relationship problem between partners  Anxiety disorder, unspecified type  Collaboration of Care: Primary Care Provider AEB - can see that patient is in therapy although may not be able to read notes  Patient/Guardian was advised Release of Information must be obtained prior to any record release in order to collaborate their care with an outside provider. Patient/Guardian was advised if they have not already done so to contact the registration department to sign all necessary forms in order for us  to release information regarding their care.   Consent: Patient/Guardian gives verbal consent for treatment and assignment of benefits for services provided during this visit. Patient/Guardian expressed understanding and agreed to proceed.   Ancel Kass, LCSW 11/22/2023

## 2023-12-07 ENCOUNTER — Encounter (HOSPITAL_COMMUNITY): Payer: Self-pay | Admitting: Clinical

## 2023-12-07 ENCOUNTER — Ambulatory Visit (HOSPITAL_COMMUNITY): Admitting: Clinical

## 2023-12-07 DIAGNOSIS — Z630 Problems in relationship with spouse or partner: Secondary | ICD-10-CM | POA: Diagnosis not present

## 2023-12-07 DIAGNOSIS — F331 Major depressive disorder, recurrent, moderate: Secondary | ICD-10-CM | POA: Diagnosis not present

## 2023-12-07 NOTE — Progress Notes (Signed)
 THERAPIST PROGRESS NOTE  Session Time: 3:01pm-4:01pm  Session #14  Virtual Visit via Video Note  I connected with Penny Gonzalez on 12/07/23 at  3:00 PM EDT by a video enabled telemedicine application and verified that I am speaking with the correct person using two identifiers.  Location: Patient: home Provider: home office   I discussed the limitations of evaluation and management by telemedicine and the availability of in person appointments. The patient expressed understanding and agreed to proceed.  I discussed the assessment and treatment plan with the patient. The patient was provided an opportunity to ask questions and all were answered. The patient agreed with the plan and demonstrated an understanding of the instructions.   The patient was advised to call back or seek an in-person evaluation if the symptoms worsen or if the condition fails to improve as anticipated.  I provided 60 minutes of non-face-to-face time during this encounter.  Penny JINNY Crest, LCSW   Participation Level: Active  Behavioral Response: Casual Alert Negative   Type of Therapy: Individual Therapy  Treatment Goals addressed:  LTG: Increase coping skills to manage depression and improve ability to perform daily activities  LTG: Penny Gonzalez will score less than 9 on the Patient Health Questionnaire (PHQ-9) LTG: Penny Gonzalez will score less than 5 on the Generalized Anxiety Disorder 7 Scale (GAD-7)  STG: Identify and decrease cognitive distortions contributing negatively to mood and behavior by identifying 5-7 cognitive distortions that are present; learn how to come up with replacement thoughts that are more balanced, realistic, and helpful.   LTG: Explore personal core beliefs, rules and assumptions, and cognitive distortions through therapist using Cognitive Behavioral Therapy; learn about Behavioral Activation and Acting As If. STG: Learn and practice communication techniques such as I  statements, open-ended questions, reflective listening, assertiveness, fair fighting rules, active listening, initiating conversations, and more as necessary and taught in session.    STG: Learn about boundary types, how to implement them, and how to enforce them so that feels more empowered and content with being able to maintain more helpful, appropriate boundaries in the future for a more balanced result.    LTG: Learn breathing techniques and grounding techniques at an age-appropriate and ability-appropriate level and demonstrate mastery in session then report independent use of these skills out of session.    STG: Learn about detachment from loved one's negative behaviors to be healthier emotionally, physically, and spiritually herself and work to detach from loved ones' poor choices. STG:  Process life events to the extent needed so that can move forward with various areas of life in a better frame of mind.    LTG: Work to Arts development officer from models like CBT, Stages of Change, DBT, shame resilience theory, ACT, SFBT, MI, trauma-informed therapy and others to be able to manage mental health symptoms, AEB practicing out of session and reporting back.    ProgressTowards Goals: Progressing  Interventions: Assertiveness Training and Supportive   Summary: Penny Gonzalez is a 28 y.o. female who presents with post-partum depression and anxiety for therapy.  She presented oriented x5 and stated she had a lot to share because a lot has happened in her life since the last session.  CSW evaluated patient's medication compliance, use of coping tools, and self-care, as applicable.  She provided an update on various aspects of her life that are normally discussed in therapy, including her relationships with two best friends and with her partner.  She shared that she has spoken with her  best friend and she has made the decision to terminate their relationship, at least for now.  She shared her reasons  and her feelings with the person who did not understand and was somewhat angry, but ultimately accepted it.  CSW encouraged her to refrain from labeling the entire 6-year relationship as a failure based on what is happening now, because it was healthy and served a good purpose for a long time.  She also has been pulling away from her other best friend due to drama that the person is continuously involved in and telling her about, because she does not like being drawn into it.  She also is irritated because she will tell the person what she needs to do and they do not do it.  She stated, I want peace, I am done with everybody's drama.  She also shared that she is done with couples therapy with her partner, acknowledging that while it could possibly work it would just take too much time and she believes there is more for him to work on by himself than for them to work on together.  He has been through a lot of trauma and does not want to have to hold his hand going through the unraveling of his trauma since she did not inflict it.  Her biggest complaint is how much he disrespects her with cursing, loud talking, and aggression, no matter how much she tells him that it triggers her.  He does not see a problem with the way he talks, does not mean anything bad by it and therefore does not intend to change, even after the couples therapist challenged him about being disrespectful.  She and others have been helping him non-stop for year, according to patient, but he disrespects even that, saying for instance that if somebody will not loan him $20, the other person is in the wrong because It's only $20.  She asks herself whether she has the mental capacity to deal with this, and the conclusion is that she does not.  She made the statement that she wants him to see what she is seeing, in terms of his behavior.  CSW challenged her on this, stating that she simply cannot control another person enough to accomplish this.   She can express what she sees, and that is the limit.  She appeared also to be thinking wistfully about a different past failed relationship, which CSW cautioned her about.  She asked CSW if she should agree to return to see the couples therapist again, which of course CSW would not answer but she did leave her with challenging questions to consider.  Suicidal/Homicidal: No without intent/plan  Therapist Response: Patient is progressing AEB engaging in scheduled therapy session.  Throughout the session, CSW gave patient the opportunity to explore thoughts and feelings associated with current life situations and past/present stressors.   CSW challenged patient gently and appropriately to consider different ways of looking at reported issues. CSW encouraged patient's expression of feelings and validated these using empathy, active listening, open body language, and unconditional positive regard.         Plan/Recommendations:  Return to therapy at next scheduled appointment on 7/17, think about the cognitive distortions she uses to assume she knows the end point of relationships in her life before the end point even arrives  Diagnosis:  Major depressive disorder, recurrent episode, moderate degree (HCC)  Relationship problem between partners  Collaboration of Care: Primary Care Provider AEB - can see that  patient is in therapy although may not be able to read notes  Patient/Guardian was advised Release of Information must be obtained prior to any record release in order to collaborate their care with an outside provider. Patient/Guardian was advised if they have not already done so to contact the registration department to sign all necessary forms in order for us  to release information regarding their care.   Consent: Patient/Guardian gives verbal consent for treatment and assignment of benefits for services provided during this visit. Patient/Guardian expressed understanding and agreed to proceed.    Penny JINNY Crest, LCSW 12/07/2023

## 2023-12-25 DIAGNOSIS — Z975 Presence of (intrauterine) contraceptive device: Secondary | ICD-10-CM | POA: Insufficient documentation

## 2023-12-28 ENCOUNTER — Ambulatory Visit (INDEPENDENT_AMBULATORY_CARE_PROVIDER_SITE_OTHER): Admitting: Clinical

## 2023-12-28 ENCOUNTER — Ambulatory Visit (HOSPITAL_COMMUNITY): Admitting: Clinical

## 2023-12-28 ENCOUNTER — Encounter (HOSPITAL_COMMUNITY): Payer: Self-pay | Admitting: Clinical

## 2023-12-28 DIAGNOSIS — F331 Major depressive disorder, recurrent, moderate: Secondary | ICD-10-CM | POA: Diagnosis not present

## 2023-12-28 DIAGNOSIS — Z630 Problems in relationship with spouse or partner: Secondary | ICD-10-CM

## 2023-12-28 DIAGNOSIS — F419 Anxiety disorder, unspecified: Secondary | ICD-10-CM

## 2023-12-28 NOTE — Progress Notes (Signed)
 THERAPIST PROGRESS NOTE  Session Time: 11:01am-12:01pm  Session #15  Virtual Visit via Video Note  I connected with Penny Gonzalez on 12/28/23 at 11:00 AM EDT by a video enabled telemedicine application and verified that I am speaking with the correct person using two identifiers.  Location: Patient: home Provider: [facility] outpatient therapy office - [place]    I discussed the limitations of evaluation and management by telemedicine and the availability of in person appointments. The patient expressed understanding and agreed to proceed.  I discussed the assessment and treatment plan with the patient. The patient was provided an opportunity to ask questions and all were answered. The patient agreed with the plan and demonstrated an understanding of the instructions.   The patient was advised to call back or seek an in-person evaluation if the symptoms worsen or if the condition fails to improve as anticipated.  I provided 60 minutes of non-face-to-face time during this encounter  Penny JINNY Crest, LCSW   Participation Level: Active  Behavioral Response: Casual Alert Negative and Down   Type of Therapy: Individual Therapy  Treatment Goals addressed:  LTG: Increase coping skills to manage depression and improve ability to perform daily activities  LTG: [name] will score less than 9 on the Patient Health Questionnaire (PHQ-9) LTG: [name] will score less than 5 on the Generalized Anxiety Disorder 7 Scale (GAD-7)  STG: Identify and decrease cognitive distortions contributing negatively to mood and behavior by identifying 5-7 cognitive distortions that are present; learn how to come up with replacement thoughts that are more balanced, realistic, and helpful.   LTG: Explore personal core beliefs, rules and assumptions, and cognitive distortions through therapist using Cognitive Behavioral Therapy; learn about Behavioral Activation and Acting As If. STG: Learn and practice  communication techniques such as I statements, open-ended questions, reflective listening, assertiveness, fair fighting rules, active listening, initiating conversations, and more as necessary and taught in session.    STG: Learn about boundary types, how to implement them, and how to enforce them so that feels more empowered and content with being able to maintain more helpful, appropriate boundaries in the future for a more balanced result.    LTG: Learn breathing techniques and grounding techniques at an age-appropriate and ability-appropriate level and demonstrate mastery in session then report independent use of these skills out of session.    STG: Learn about detachment from loved one's negative behaviors to be healthier emotionally, physically, and spiritually herself and work to detach from loved ones' poor choices. STG:  Process life events to the extent needed so that can move forward with various areas of life in a better frame of mind.    LTG: Work to Arts development officer from models like CBT, Stages of Change, DBT, shame resilience theory, ACT, SFBT, MI, trauma-informed therapy and others to be able to manage mental health symptoms, AEB practicing out of session and reporting back.    ProgressTowards Goals: Progressing  Interventions: Conservator, museum/gallery, Supportive, and Other: Detachment   Summary: Penny Gonzalez is a 28 y.o. female who presents with post-partum depression and anxiety for therapy.  She presented oriented x5 and stated she was feeling a rough week.  CSW evaluated patient's medication compliance, use of coping tools, and self-care, as applicable.  She provided an update on various aspects of her life that are normally discussed in therapy, including job frustrations, father's ill health, and ongoing relationship problems with ex-best friend and ex-boyfriend.  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  CSW guided her to reflect back on the reasons she wanted the at-home position in the first place, which she stated helped her to regain perspective.  Toward the end of the session, she also received a very complimentary email from her manager which surprised her but made her feel better.  Throughout the session, on each of the issues discussed, CSW kept reminding her to act instead of react.  Her father has been quite sick, spent 10 days in the hospital recently (during which she was able to take her laptop to the hospital and work from there, one of the pluses of the current position), and she fears their roles have reversed so that now she is parenting him.  She has always been a daddy's girl so this is a hard transition for her.  She shared that she has already made the decision that when/if she loses her father, she will take her 5 days of bereavement time, then will take a full 6 weeks off through [facility].  We talked about her ex-best-friend who is also her 1yo son's godmother and about her ex-boyfriend.  She still has not decided whether to return to therapy with the boyfriend, although she has reached out to the therapist and knows they are welcome back.  She wants to completely block her ex-best friend, was reminded that this action is similar to slamming a door shut, whereas just leaving things like they are now is more like gently shutting the door (in which case it can actually be reopened.  Various options on each of these items was explored throughout the entirety of the session.  Suicidal/Homicidal: No without intent/plan  Therapist Response: Patient is progressing AEB engaging in scheduled therapy session.  Throughout the session, CSW gave patient the opportunity to explore thoughts and feelings associated with current life situations and past/present stressors.   CSW challenged patient gently and appropriately to consider different ways of looking  at reported issues. CSW encouraged patient's expression of feelings and validated these using empathy, active listening, open body language, and unconditional positive regard.   Another appointment was scheduled, all will be virtual      Plan/Recommendations:  Return to therapy at next scheduled appointment on 7/30, continue working to act rather than react  Diagnosis:  Major depressive disorder, recurrent episode, moderate degree (HCC)  Relationship problem between partners  Anxiety disorder, unspecified type  Collaboration of Care: Primary Care Provider AEB - can see that patient is in therapy although may not be able to read notes  Patient/Guardian was advised Release of Information must be obtained prior to any record release in order to collaborate their care with an outside provider. Patient/Guardian was advised if they have not already done so to contact the registration department to sign all necessary forms in order for us  to release information regarding their care.   Consent: Patient/Guardian gives verbal consent for treatment and assignment of benefits for services provided during this visit. Patient/Guardian expressed understanding and agreed to proceed.   Penny JINNY Crest, LCSW 12/28/2023

## 2024-01-10 ENCOUNTER — Ambulatory Visit (INDEPENDENT_AMBULATORY_CARE_PROVIDER_SITE_OTHER): Admitting: Clinical

## 2024-01-10 ENCOUNTER — Encounter (HOSPITAL_COMMUNITY): Payer: Self-pay | Admitting: Clinical

## 2024-01-10 DIAGNOSIS — F419 Anxiety disorder, unspecified: Secondary | ICD-10-CM | POA: Diagnosis not present

## 2024-01-10 DIAGNOSIS — F331 Major depressive disorder, recurrent, moderate: Secondary | ICD-10-CM | POA: Diagnosis not present

## 2024-01-10 DIAGNOSIS — Z630 Problems in relationship with spouse or partner: Secondary | ICD-10-CM | POA: Diagnosis not present

## 2024-01-10 NOTE — Progress Notes (Unsigned)
 THERAPIST PROGRESS NOTE  Session Time: 3:04pm-4:02pm  Session #16  Virtual Visit via Video Note  I connected with Penny Gonzalez on 01/10/24 at  3:00 PM EDT by a video enabled telemedicine application and verified that I am speaking with the correct person using two identifiers.  Location: Patient: home Provider: [facility] outpatient therapy office - [place]    I discussed the limitations of evaluation and management by telemedicine and the availability of in person appointments. The patient expressed understanding and agreed to proceed.  I discussed the assessment and treatment plan with the patient. The patient was provided an opportunity to ask questions and all were answered. The patient agreed with the plan and demonstrated an understanding of the instructions.   The patient was advised to call back or seek an in-person evaluation if the symptoms worsen or if the condition fails to improve as anticipated.  I provided 58 minutes of non-face-to-face time during this encounter  [place] JINNY Crest, LCSW   Participation Level: Active  Behavioral Response: Casual Alert Negative and Irritable   Type of Therapy: Individual Therapy  Treatment Goals addressed:  LTG: Increase coping skills to manage depression and improve ability to perform daily activities  LTG: Kelina will score less than 9 on the Patient Health Questionnaire (PHQ-9) LTG: Lupita will score less than 5 on the Generalized Anxiety Disorder 7 Scale (GAD-7)  STG: Identify and decrease cognitive distortions contributing negatively to mood and behavior by identifying 5-7 cognitive distortions that are present; learn how to come up with replacement thoughts that are more balanced, realistic, and helpful.   LTG: Explore personal core beliefs, rules and assumptions, and cognitive distortions through therapist using Cognitive Behavioral Therapy; learn about Behavioral Activation and Acting As If. STG: Learn and  practice communication techniques such as I statements, open-ended questions, reflective listening, assertiveness, fair fighting rules, active listening, initiating conversations, and more as necessary and taught in session.    STG: Learn about boundary types, how to implement them, and how to enforce them so that feels more empowered and content with being able to maintain more helpful, appropriate boundaries in the future for a more balanced result.    LTG: Learn breathing techniques and grounding techniques at an age-appropriate and ability-appropriate level and demonstrate mastery in session then report independent use of these skills out of session.    STG: Learn about detachment from loved one's negative behaviors to be healthier emotionally, physically, and spiritually herself and work to detach from loved ones' poor choices. STG:  Process life events to the extent needed so that can move forward with various areas of life in a better frame of mind.    LTG: Work to Arts development officer from models like CBT, Stages of Change, DBT, shame resilience theory, ACT, SFBT, MI, trauma-informed therapy and others to be able to manage mental health symptoms, AEB practicing out of session and reporting back.    ProgressTowards Goals: Progressing  Interventions: DBT and Supportive   Summary: Penny Gonzalez is a 28 y.o. female who presents with post-partum depression and anxiety for therapy.  She presented oriented x5 and stated she was feeling I'm here, but I feel a waste.  CSW evaluated patient's medication compliance, use of coping tools, and self-care, as applicable.  She provided an update on various aspects of her life that are normally discussed in therapy, including problems in her most recent relationship and father's health. She disclosed that her baby's father has now left the home again  because she told him it is time for him to get his own place. He initially said she would have to evict  him; therefore, she started working with the [facility] Department immediately.  What compelled her to make this final decision to kick him out was that she came home after spending the night at her parents' house to find that he had not flushed a toilet and it was the last straw for her. Additionally, her niece came to the home to retrieve a gun she had left there with his knowledge, but he had not shared this with the patient, which angered her greatly. She stated, "He officially brought out the beast in me."  She reported that he did live her home but keeps saying he is homeless, is drinking to the point of blackout, and is threatening suicide. Her response to him was, "OK, let me know when and whether you want to be cremated or what.  She reported not being willing to have a pity party for him. Most of the rest of the session was spent talking about the future and how she can use the last two relationships to inform what she looks for and pays attention to in future relationships. She expressed that she has terrible taste in men and when CSW asked her to identify the commonality, she responded that she has come to believe she attracts what she grew up around.  She talked somewhat about her father, whom she is still helping since he just got out of the hospital. We explored the red flags in each of these relationships that she can watch for in the future. She kept defending things they had "talked about," indicating they should not be a problem.  CSW asked her to consider whether they had talked about it or she had talked and the partner had agreed. CSW alsointroduced the concept of differences in making decisions in either Emotion Mind or Rational Mind, suggesting to her that the best decisions are made when those are combined and we act in [employer]. She understood this concept clearly and agreed it would be helpful.   Suicidal/Homicidal: No without intent/plan  Therapist Response: Patient is progressing  AEB engaging in scheduled therapy session.  Throughout the session, CSW gave patient the opportunity to explore thoughts and feelings associated with current life situations and past/present stressors.   CSW challenged patient gently and appropriately to consider different ways of looking at reported issues. CSW encouraged patient's expression of feelings and validated these using empathy, active listening, open body language, and unconditional positive regard.   Another appointment was scheduled, all will be virtual      Plan/Recommendations:  Return to therapy at next scheduled appointment on 8/29, CSW to call if an appointment comes available prior to then, consider Emotion Mind/Rational Mind/Wise Mind as shared in session and handout sent to her.  Diagnosis:  Major depressive disorder, recurrent episode, moderate degree (HCC)  Anxiety disorder, unspecified type  Relationship problem between partners  Collaboration of Care: Primary Care Provider AEB - can see that patient is in therapy although may not be able to read notes  Patient/Guardian was advised Release of Information must be obtained prior to any record release in order to collaborate their care with an outside provider. Patient/Guardian was advised if they have not already done so to contact the registration department to sign all necessary forms in order for us  to release information regarding their care.   Consent: Patient/Guardian gives verbal consent for  treatment and assignment of benefits for services provided during this visit. Patient/Guardian expressed understanding and agreed to proceed.   [place] JINNY Crest, LCSW 01/10/2024

## 2024-01-25 ENCOUNTER — Other Ambulatory Visit: Payer: Self-pay | Admitting: Family

## 2024-02-08 ENCOUNTER — Ambulatory Visit (HOSPITAL_COMMUNITY): Admitting: Clinical

## 2024-02-09 ENCOUNTER — Encounter (HOSPITAL_COMMUNITY): Payer: Self-pay | Admitting: Clinical

## 2024-02-09 ENCOUNTER — Ambulatory Visit (HOSPITAL_COMMUNITY): Admitting: Clinical

## 2024-02-09 DIAGNOSIS — Z630 Problems in relationship with spouse or partner: Secondary | ICD-10-CM | POA: Diagnosis not present

## 2024-02-09 DIAGNOSIS — F419 Anxiety disorder, unspecified: Secondary | ICD-10-CM

## 2024-02-09 DIAGNOSIS — F331 Major depressive disorder, recurrent, moderate: Secondary | ICD-10-CM

## 2024-02-09 NOTE — Progress Notes (Signed)
 THERAPIST PROGRESS NOTE  Session Time: 10:00am-10:47am  Session #17  Virtual Visit via Video Note  I connected with Penny Gonzalez on 02/09/24 at 10:00 AM EDT by a video enabled telemedicine application and verified that I am speaking with the correct person using two identifiers.  Location: Patient: home Provider: home office   I discussed the limitations of evaluation and management by telemedicine and the availability of in person appointments. The patient expressed understanding and agreed to proceed.  I discussed the assessment and treatment plan with the patient. The patient was provided an opportunity to ask questions and all were answered. The patient agreed with the plan and demonstrated an understanding of the instructions.   The patient was advised to call back or seek an in-person evaluation if the symptoms worsen or if the condition fails to improve as anticipated.  I provided 47 minutes of non-face-to-face time during this encounter  [place] JINNY Crest, LCSW   Participation Level: Active  Behavioral Response: Casual Alert Euthymic   Type of Therapy: Individual Therapy  Treatment Goals addressed:  LTG: Increase coping skills to manage depression and improve ability to perform daily activities  LTG: Skyllar will score less than 9 on the Patient Health Questionnaire (PHQ-9) LTG: Desarai will score less than 5 on the Generalized Anxiety Disorder 7 Scale (GAD-7)  STG: Identify and decrease cognitive distortions contributing negatively to mood and behavior by identifying 5-7 cognitive distortions that are present; learn how to come up with replacement thoughts that are more balanced, realistic, and helpful.   LTG: Explore personal core beliefs, rules and assumptions, and cognitive distortions through therapist using Cognitive Behavioral Therapy; learn about Behavioral Activation and Acting As If. STG: Learn and practice communication techniques such as I  statements, open-ended questions, reflective listening, assertiveness, fair fighting rules, active listening, initiating conversations, and more as necessary and taught in session.    STG: Learn about boundary types, how to implement them, and how to enforce them so that feels more empowered and content with being able to maintain more helpful, appropriate boundaries in the future for a more balanced result.    LTG: Learn breathing techniques and grounding techniques at an age-appropriate and ability-appropriate level and demonstrate mastery in session then report independent use of these skills out of session.    STG: Learn about detachment from loved one's negative behaviors to be healthier emotionally, physically, and spiritually herself and work to detach from loved ones' poor choices. STG:  Process life events to the extent needed so that can move forward with various areas of life in a better frame of mind.    LTG: Work to Arts development officer from models like CBT, Stages of Change, DBT, shame resilience theory, ACT, SFBT, MI, trauma-informed therapy and others to be able to manage mental health symptoms, AEB practicing out of session and reporting back.    ProgressTowards Goals: Progressing  Interventions: Supportive and Other: detachment work   Summary: Penny Gonzalez is a 28 y.o. female who presents with post-partum depression and anxiety for therapy.  She presented oriented x5 and stated she was feeling good, okay.  CSW evaluated patient's medication compliance, use of coping tools, and self-care, as applicable.  She provided an update on various aspects of her life that are normally discussed in therapy, including ongoing issues with separating from her youngest child's father and her decision to sell her house.  She shared that her anxiety is very high right now, although CSW noted to her  that she sounds much LESS stressed than she has in recent sessions.  She described her  ex-boyfriend's statements to her since she kicked him out of the home and her realization that he has never grown up.  CSW emphasized once again the concept of detachment, not doing for him what he can do for himself (such as figure out what to buy for a baby, since it is his 6th child) and allowing circumstances to unfold naturally without trying to prevent or cause a crisis (such as him going to court over custody while he is homeless and not supplying anything for the baby not likely to have good results for him).  She actually expressed gratitude for where she is now in her life, stating that she learned a lot about herself and her capabilities through this situation.  CSW agreed with her and applauded her insight.  We talked about her pain over the loss of the relationship and how it will slowly lessen over time, but will always remain a part of her since she had deep feelings for him and had a child with him.  She has given him the option of either paying child support on his own, her going to court for full custody, or him terminating his parental rights and never being obligated to pay child support.  He stated he would see her in court.  Lately, he has contacted her numerous times and she does not respond, after finding out of yet another sexual partner he had while she was supposedly in a committed relationship with him.  In her anger and resentment, she has been throwing away the possessions he left behind.  Patient also disclosed that she has decided to sell her house and move her children to an apartment in [place] to be closer to her parents and to the daughter's school.  She has already started packing.  We explored what this is going to look like for her and she was able to confirm that she does not feel that giving up the first home she bought has any long-term negative effect on her life, as she will buy again at some point.  Suicidal/Homicidal: No without intent/plan  Therapist Response:  Patient is progressing AEB engaging in scheduled therapy session.  Throughout the session, CSW gave patient the opportunity to explore thoughts and feelings associated with current life situations and past/present stressors.   CSW challenged patient gently and appropriately to consider different ways of looking at reported issues. CSW encouraged patient's expression of feelings and validated these using empathy, active listening, open body language, and unconditional positive regard.   Two additional appointments on 10/3 and 10/24 were scheduled.      Plan/Recommendations:  Return to therapy at next scheduled appointment on 9/16, continue to work on detachment  Diagnosis:  Major depressive disorder, recurrent episode, moderate degree (HCC)  Anxiety disorder, unspecified type  Relationship problem between partners  Collaboration of Care: Primary Care Provider AEB - can see that patient is in therapy although may not be able to read notes  Patient/Guardian was advised Release of Information must be obtained prior to any record release in order to collaborate their care with an outside provider. Patient/Guardian was advised if they have not already done so to contact the registration department to sign all necessary forms in order for us  to release information regarding their care.   Consent: Patient/Guardian gives verbal consent for treatment and assignment of benefits for services provided during this visit. Patient/Guardian expressed  understanding and agreed to proceed.   [place] JINNY Crest, LCSW 02/09/2024

## 2024-02-18 ENCOUNTER — Other Ambulatory Visit: Payer: Self-pay | Admitting: Family

## 2024-02-27 ENCOUNTER — Ambulatory Visit (HOSPITAL_COMMUNITY): Admitting: Clinical

## 2024-02-27 ENCOUNTER — Encounter (HOSPITAL_COMMUNITY): Payer: Self-pay | Admitting: Clinical

## 2024-02-27 DIAGNOSIS — F419 Anxiety disorder, unspecified: Secondary | ICD-10-CM | POA: Diagnosis not present

## 2024-02-27 DIAGNOSIS — F331 Major depressive disorder, recurrent, moderate: Secondary | ICD-10-CM

## 2024-02-27 NOTE — Progress Notes (Signed)
 THERAPIST PROGRESS NOTE  Session Time: 2:00pm-2:20pm  Session #18  Virtual Visit via Video Note  I connected with Penny Gonzalez on 02/27/24 at  2:00 PM EDT by a video enabled telemedicine application and verified that I am speaking with the correct person using two identifiers.  Location: Patient: office in private setting Provider: home office   I discussed the limitations of evaluation and management by telemedicine and the availability of in person appointments. The patient expressed understanding and agreed to proceed.  I discussed the assessment and treatment plan with the patient. The patient was provided an opportunity to ask questions and all were answered. The patient agreed with the plan and demonstrated an understanding of the instructions.   The patient was advised to call back or seek an in-person evaluation if the symptoms worsen or if the condition fails to improve as anticipated.  I provided 20 minutes of non-face-to-face time during this encounter  Elgie JINNY Crest, LCSW   Participation Level: Active  Behavioral Response: Casual Alert Euthymic   Type of Therapy: Individual Therapy  Treatment Goals addressed:  LTG: Increase coping skills to manage depression and improve ability to perform daily activities  LTG: Penny Gonzalez will score less than 9 on the Patient Health Questionnaire (PHQ-9) LTG: Penny Gonzalez will score less than 5 on the Generalized Anxiety Disorder 7 Scale (GAD-7)  STG: Identify and decrease cognitive distortions contributing negatively to mood and behavior by identifying 5-7 cognitive distortions that are present; learn how to come up with replacement thoughts that are more balanced, realistic, and helpful.   LTG: Explore personal core beliefs, rules and assumptions, and cognitive distortions through therapist using Cognitive Behavioral Therapy; learn about Behavioral Activation and Acting As If. STG: Learn and practice communication  techniques such as I statements, open-ended questions, reflective listening, assertiveness, fair fighting rules, active listening, initiating conversations, and more as necessary and taught in session.    STG: Learn about boundary types, how to implement them, and how to enforce them so that feels more empowered and content with being able to maintain more helpful, appropriate boundaries in the future for a more balanced result.    LTG: Learn breathing techniques and grounding techniques at an age-appropriate and ability-appropriate level and demonstrate mastery in session then report independent use of these skills out of session.    STG: Learn about detachment from loved one's negative behaviors to be healthier emotionally, physically, and spiritually herself and work to detach from loved ones' poor choices. STG:  Process life events to the extent needed so that can move forward with various areas of life in a better frame of mind.    LTG: Work to Arts development officer from models like CBT, Stages of Change, DBT, shame resilience theory, ACT, SFBT, MI, trauma-informed therapy and others to be able to manage mental health symptoms, AEB practicing out of session and reporting back.    ProgressTowards Goals: Progressing  Interventions: Supportive   Summary: Penny Gonzalez is a 28 y.o. female who presents with post-partum depression and anxiety for therapy.  She presented oriented x5 and stated she was feeling life has been a hell hole, too much going on, not liking it at all.  CSW evaluated patient's medication compliance, use of coping tools, and self-care, as applicable.  She provided an update on various aspects of her life that are normally discussed in therapy, including the status of selling her house and moving, work issues, and having no further contact with her baby's father.  Her schedule at work was demanding today and she could not talk long, could not take her hour lunch for this  session like she usually does.  Her mood did not appear noticeably different, but she verbalized being under more stress.  Even though her baby's father is still not providing for the child and has still not seen the child since leaving the home, she is not going to pursue the custody issue until she has completed her home sale and move to [place].  She sees the psychiatric provider and is hopeful to get FMLA paperwork completed.  She disclosed that everything is stressing me out at home and at work right now.  CSW provided encouragement and support that her instincts as a mother and as an advocate for herself are good, so to keep trusting herself.  She was regretful when called back to work, stated she had really needed the session today.  She was reassured that we do have an appointment in 2 weeks.  Suicidal/Homicidal: No without intent/plan  Therapist Response: Patient is progressing AEB engaging in scheduled therapy session.  Throughout the session, CSW gave patient the opportunity to explore thoughts and feelings associated with current life situations and past/present stressors.   CSW challenged patient gently and appropriately to consider different ways of looking at reported issues. CSW encouraged patient's expression of feelings and validated these using empathy, active listening, open body language, and unconditional positive regard.         Plan/Recommendations:  Return to therapy at next scheduled appointment on 10/3, work to confront one stressor at a time.   Diagnosis:  Major depressive disorder, recurrent episode, moderate degree (HCC)  Anxiety disorder, unspecified type  Collaboration of Care: Primary Care Provider AEB - can see that patient is in therapy although may not be able to read notes  Patient/Guardian was advised Release of Information must be obtained prior to any record release in order to collaborate their care with an outside provider. Patient/Guardian was advised if  they have not already done so to contact the registration department to sign all necessary forms in order for us  to release information regarding their care.   Consent: Patient/Guardian gives verbal consent for treatment and assignment of benefits for services provided during this visit. Patient/Guardian expressed understanding and agreed to proceed.   Elgie JINNY Crest, LCSW 02/27/2024

## 2024-02-28 ENCOUNTER — Ambulatory Visit (INDEPENDENT_AMBULATORY_CARE_PROVIDER_SITE_OTHER): Admitting: Family

## 2024-02-28 ENCOUNTER — Encounter: Payer: Self-pay | Admitting: Family

## 2024-02-28 VITALS — BP 124/72 | HR 95 | Ht 68.0 in | Wt 222.0 lb

## 2024-02-28 DIAGNOSIS — I10 Essential (primary) hypertension: Secondary | ICD-10-CM

## 2024-02-28 DIAGNOSIS — E1165 Type 2 diabetes mellitus with hyperglycemia: Secondary | ICD-10-CM

## 2024-02-28 DIAGNOSIS — E611 Iron deficiency: Secondary | ICD-10-CM

## 2024-02-28 DIAGNOSIS — Z6831 Body mass index (BMI) 31.0-31.9, adult: Secondary | ICD-10-CM

## 2024-02-28 DIAGNOSIS — E782 Mixed hyperlipidemia: Secondary | ICD-10-CM | POA: Diagnosis not present

## 2024-02-28 DIAGNOSIS — R5383 Other fatigue: Secondary | ICD-10-CM

## 2024-02-28 DIAGNOSIS — E559 Vitamin D deficiency, unspecified: Secondary | ICD-10-CM | POA: Diagnosis not present

## 2024-02-28 DIAGNOSIS — Z202 Contact with and (suspected) exposure to infections with a predominantly sexual mode of transmission: Secondary | ICD-10-CM

## 2024-02-28 DIAGNOSIS — E538 Deficiency of other specified B group vitamins: Secondary | ICD-10-CM

## 2024-02-28 MED ORDER — RYBELSUS 14 MG PO TABS: 14.0000 mg | ORAL_TABLET | Freq: Every day | ORAL | 2 refills | Status: DC

## 2024-02-28 NOTE — Progress Notes (Signed)
 Established Patient Office Visit  Subjective:  Patient ID: Penny Gonzalez, female    DOB: 1995/10/28  Age: 28 y.o. MRN: [id number]  Chief Complaint  Patient presents with   Follow-up    FMLA paperwork    FMLA - Psych - multiple stressors, selling house, work, etc.  One solid block of time off, not intermittent.   Blood sugars - hasn't been checking.  Asks if we can increase the dose of her Rybelsus  again.   Needs retesting, her significant other was unfaithful and he did give her trich, so she asks if we can retest.    No other concerns at this time.   Past Medical History:  Diagnosis Date   Anemia, iron deficiency, inadequate dietary intake 12/01/2014   Depression    patient has not been officially diagnosed but has symptoms   Diabetes mellitus without complication (HCC)    Recently changed to Glyburide  and thinks it is helping   Essential hypertension, benign 03/31/2016   Fetal macrosomia affecting management of mother, antepartum 10/16/2022   History of pre-eclampsia in prior pregnancy, currently pregnant 05/11/2022   History of preterm delivery, currently pregnant 05/11/2022   Irregular periods/menstrual cycles    Joint pain of leg    Major depression 11/23/2015   Obesity in pregnancy 11/29/2017   Obesity in pregnancy, antepartum 04/22/2022   Positive GBS test 10/06/2022   Pre-existing diabetes mellitus during pregnancy in third trimester 06/24/2014   Pre-existing diabetes mellitus in pregnancy 10/15/2022   Pre-existing essential hypertension during pregnancy in third trimester 09/30/2022   Preexisting diabetes complicating pregnancy, antepartum 07/27/2022   Pregnancy with type 2 diabetes mellitus in first trimester 04/22/2022   Sciatica    Supervision of normal pregnancy 04/19/2022   28 y.o. [id number], at [redacted]w[redacted]d based on LMP of 01/29/22, with an Estimated Date of Delivery: 11/05/22.     Sex of baby and name:      Partner:    [name]     Factors complicating this  pregnancy   Deliveries at 28 week x2     Obesity BMI:   Baseline labs:  Early 1 hour GTT: n/a  P/C ratio:  79  CMP: wnl  A1c: 6.5     H/o Preeclampsia,   ASA at 12 weeks (d/c 35-37 weeks) - discussed 04/21/22     Diabetes     Past Surgical History:  Procedure Laterality Date   CHOLECYSTECTOMY N/A 05/19/2016   Procedure: LAPAROSCOPIC CHOLECYSTECTOMY WITH INTRAOPERATIVE CHOLANGIOGRAM;  Surgeon: Larinda Unknown Sharps, MD;  Location: [facility] ORS;  Service: General;  Laterality: N/A;    Social History   Socioeconomic History   Marital status: Single    Spouse name: Not on file   Number of children: 1   Years of education: 13   Highest education level: High school graduate  Occupational History   Occupation: [employer]  Tobacco Use   Smoking status: Never    Passive exposure: Never   Smokeless tobacco: Never  Vaping Use   Vaping status: Never Used  Substance and Sexual Activity   Alcohol use: Not Currently    Comment: OCC   Drug use: No   Sexual activity: Yes    Partners: Male    Birth control/protection: Surgical    Comment: tubal  Other Topics Concern   Not on file  Social History Narrative   Not on file   Social Drivers of Health   Financial Resource Strain: Low Risk  (04/28/2022)   Overall Physicist, medical Strain (  CARDIA)    Difficulty of Paying Living Expenses: Not hard at all  Food Insecurity: No Food Insecurity (10/15/2022)   Hunger Vital Sign    Worried About Running Out of Food in the Last Year: Never true    Ran Out of Food in the Last Year: Never true  Transportation Needs: No Transportation Needs (10/15/2022)   PRAPARE - Administrator, Civil Service (Medical): No    Lack of Transportation (Non-Medical): No  Physical Activity: Inactive (04/28/2022)   Exercise Vital Sign    Days of Exercise per Week: 0 days    Minutes of Exercise per Session: 0 min  Stress: No Stress Concern Present (04/28/2022)   [employer] of Occupational Health - Occupational  Stress Questionnaire    Feeling of Stress : Only a little  Social Connections: Moderately Integrated (04/28/2022)   Social Connection and Isolation Panel    Frequency of Communication with Friends and Family: More than three times a week    Frequency of Social Gatherings with Friends and Family: Once a week    Attends Religious Services: 1 to 4 times per year    Active Member of [employer] or Organizations: No    Attends Banker Meetings: Never    Marital Status: Living with partner  Intimate Partner Violence: Unknown (10/15/2022)   Humiliation, Afraid, Rape, and Kick questionnaire    Fear of Current or Ex-Partner: No    Emotionally Abused: No    Physically Abused: Not on file    Sexually Abused: Not on file    Family History  Problem Relation Age of Onset   Diabetes Mother    Arthritis Father    Diabetes Father    Heart disease Father    Hypertension Father    Stroke Father    Lupus Sister    Deafness Sister    Diabetes Maternal Grandmother    Hypertension Maternal Grandmother    Heart disease Paternal Grandmother    Hypertension Paternal Grandmother     Allergies  Allergen Reactions   Latex Rash    Pruritic, urticaria    Review of Systems  All other systems reviewed and are negative.      Objective:   BP 124/72   Pulse 95   Ht 5' 8 (1.727 m)   Wt 222 lb (100.7 kg)   SpO2 98%   BMI 33.75 kg/m   Vitals:   02/28/24 1338  BP: 124/72  Pulse: 95  Height: 5' 8 (1.727 m)  Weight: 222 lb (100.7 kg)  SpO2: 98%  BMI (Calculated): 33.76    Physical Exam Vitals and nursing note reviewed.  Constitutional:      Appearance: Normal appearance. She is normal weight.  HENT:     Head: Normocephalic.  Eyes:     Extraocular Movements: Extraocular movements intact.     Conjunctiva/sclera: Conjunctivae normal.     Pupils: Pupils are equal, round, and reactive to light.  Cardiovascular:     Rate and Rhythm: Normal rate.  Pulmonary:     Effort: Pulmonary  effort is normal.  Neurological:     General: No focal deficit present.     Mental Status: She is alert and oriented to person, place, and time. Mental status is at baseline.  Psychiatric:        Mood and Affect: Mood normal.        Behavior: Behavior normal.        Thought Content: Thought content normal.  No results found for any visits on 02/28/24.  No results found for this or any previous visit (from the past 2160 hours).     Assessment & Plan Contact with and (suspected) exposure to infections with a predominantly sexual mode of transmission  Vitamin D  deficiency Other fatigue Iron deficiency B12 deficiency due to diet Checking labs today.  Will continue supplements as needed.   - Vitamin D  - Vitamin B12 - TSH - Iron labs  Type 2 diabetes mellitus with hyperglycemia, without long-term current use of insulin  (HCC) Increase Rybelsus  to 14mg  dose.  Checking labs today. Will call pt. With results Will adjust meds if needed based on labs.   -CBC w/Diff -CMP w/eGFR -Hemoglobin A1C  BMI 31.0-31.9,adult Continue current meds.  Will adjust as needed based on results.  The patient is asked to make an attempt to improve diet and exercise patterns to aid in medical management of this problem. Addressed importance of increasing and maintaining water intake.   Essential hypertension, benign Blood pressure well controlled with current medications.  Continue current therapy.  Will reassess at follow up.   - CBC w/Diff - CMP w/eGFR  Mixed hyperlipidemia Checking labs today.  Continue current therapy for lipid control. Will modify as needed based on labwork results.   -CMP w/eGFR -Lipid Panel     Return in about 1 month (around 03/29/2024).   Total time spent: 30 minutes  ALAN CHRISTELLA ARRANT, FNP  02/28/2024   This document may have been prepared by [facility] Voice Recognition software and as such may include unintentional dictation errors.

## 2024-02-29 LAB — IRON,TIBC AND FERRITIN PANEL
Ferritin: 9 ng/mL — ABNORMAL LOW (ref 15–150)
Iron Saturation: 7 % — CL (ref 15–55)
Iron: 32 ug/dL (ref 27–159)
Total Iron Binding Capacity: 431 ug/dL (ref 250–450)
UIBC: 399 ug/dL (ref 131–425)

## 2024-02-29 LAB — ACUTE VIRAL HEPATITIS (HAV, HBV, HCV)
HCV Ab: NONREACTIVE
Hep A IgM: NEGATIVE
Hep B C IgM: NEGATIVE
Hepatitis B Surface Ag: NEGATIVE

## 2024-02-29 LAB — CBC WITH DIFFERENTIAL/PLATELET
Basophils Absolute: 0 x10E3/uL (ref 0.0–0.2)
Basos: 0 %
EOS (ABSOLUTE): 0.1 x10E3/uL (ref 0.0–0.4)
Eos: 1 %
Hematocrit: 37.5 % (ref 34.0–46.6)
Hemoglobin: 11.5 g/dL (ref 11.1–15.9)
Immature Grans (Abs): 0 x10E3/uL (ref 0.0–0.1)
Immature Granulocytes: 0 %
Lymphocytes Absolute: 2.5 x10E3/uL (ref 0.7–3.1)
Lymphs: 40 %
MCH: 26.3 pg — ABNORMAL LOW (ref 26.6–33.0)
MCHC: 30.7 g/dL — ABNORMAL LOW (ref 31.5–35.7)
MCV: 86 fL (ref 79–97)
Monocytes Absolute: 0.4 x10E3/uL (ref 0.1–0.9)
Monocytes: 6 %
Neutrophils Absolute: 3.3 x10E3/uL (ref 1.4–7.0)
Neutrophils: 53 %
Platelets: 293 x10E3/uL (ref 150–450)
RBC: 4.37 x10E6/uL (ref 3.77–5.28)
RDW: 14.9 % (ref 11.7–15.4)
WBC: 6.3 x10E3/uL (ref 3.4–10.8)

## 2024-02-29 LAB — CMP14+EGFR
ALT: 13 IU/L (ref 0–32)
AST: 18 IU/L (ref 0–40)
Albumin: 4.5 g/dL (ref 4.0–5.0)
Alkaline Phosphatase: 60 IU/L (ref 41–116)
BUN/Creatinine Ratio: 11 (ref 9–23)
BUN: 7 mg/dL (ref 6–20)
Bilirubin Total: 0.3 mg/dL (ref 0.0–1.2)
CO2: 21 mmol/L (ref 20–29)
Calcium: 8.9 mg/dL (ref 8.7–10.2)
Chloride: 104 mmol/L (ref 96–106)
Creatinine, Ser: 0.62 mg/dL (ref 0.57–1.00)
Globulin, Total: 2.6 g/dL (ref 1.5–4.5)
Glucose: 142 mg/dL — ABNORMAL HIGH (ref 70–99)
Potassium: 3.8 mmol/L (ref 3.5–5.2)
Sodium: 139 mmol/L (ref 134–144)
Total Protein: 7.1 g/dL (ref 6.0–8.5)
eGFR: 123 mL/min/1.73 (ref >59)

## 2024-02-29 LAB — VITAMIN D 25 HYDROXY (VIT D DEFICIENCY, FRACTURES): Vit D, 25-Hydroxy: 22.4 ng/mL — ABNORMAL LOW (ref 30.0–100.0)

## 2024-02-29 LAB — TREPONEMAL ANTIBODIES, TPPA: Treponemal Antibodies, TPPA: NONREACTIVE

## 2024-02-29 LAB — HEMOGLOBIN A1C
Est. average glucose Bld gHb Est-mCnc: 134 mg/dL
Hgb A1c MFr Bld: 6.3 % — ABNORMAL HIGH (ref 4.8–5.6)

## 2024-02-29 LAB — LIPID PANEL
Chol/HDL Ratio: 2.8 ratio (ref 0.0–4.4)
Cholesterol, Total: 154 mg/dL (ref 100–199)
HDL: 55 mg/dL (ref >39)
LDL Chol Calc (NIH): 74 mg/dL (ref 0–99)
Triglycerides: 148 mg/dL (ref 0–149)
VLDL Cholesterol Cal: 25 mg/dL (ref 5–40)

## 2024-02-29 LAB — HCV INTERPRETATION

## 2024-02-29 LAB — HIV ANTIBODY (ROUTINE TESTING W REFLEX): HIV Screen 4th Generation wRfx: NONREACTIVE

## 2024-02-29 LAB — TSH: TSH: 0.524 u[IU]/mL (ref 0.450–4.500)

## 2024-02-29 LAB — HSV 1 AND 2 AB, IGG
HSV 1 Glycoprotein G Ab, IgG: REACTIVE — AB
HSV 2 IgG, Type Spec: NONREACTIVE

## 2024-02-29 LAB — VITAMIN B12: Vitamin B-12: 597 pg/mL (ref 232–1245)

## 2024-02-29 LAB — RPR W/REFLEX TO TREPSURE: RPR: NONREACTIVE

## 2024-03-01 ENCOUNTER — Telehealth: Payer: Self-pay | Admitting: Family

## 2024-03-01 LAB — NUSWAB VG PLUS+MYCOPLASMAS,NAA
Candida albicans, NAA: NEGATIVE
Candida glabrata, NAA: NEGATIVE
Chlamydia trachomatis, NAA: NEGATIVE
Mycoplasma genitalium NAA: POSITIVE — AB
Mycoplasma hominis NAA: NEGATIVE
Neisseria gonorrhoeae, NAA: NEGATIVE
Trich vag by NAA: NEGATIVE
Ureaplasma spp NAA: NEGATIVE

## 2024-03-01 NOTE — Telephone Encounter (Signed)
Patient left VM inquiring about her lab results. Please advise.

## 2024-03-04 MED ORDER — DOXYCYCLINE HYCLATE 100 MG PO CAPS: 100.0000 mg | ORAL_CAPSULE | Freq: Two times a day (BID) | ORAL | 0 refills | Status: DC

## 2024-03-04 MED ORDER — MOXIFLOXACIN HCL 400 MG PO TABS: 400.0000 mg | ORAL_TABLET | Freq: Every day | ORAL | 0 refills | Status: DC

## 2024-03-04 NOTE — Telephone Encounter (Signed)
 Already given results per rebecca on 9/19

## 2024-03-06 ENCOUNTER — Encounter: Payer: Self-pay | Admitting: Family

## 2024-03-06 NOTE — Assessment & Plan Note (Signed)
 Continue current meds.  Will adjust as needed based on results.  The patient is asked to make an attempt to improve diet and exercise patterns to aid in medical management of this problem. Addressed importance of increasing and maintaining water  intake.

## 2024-03-06 NOTE — Assessment & Plan Note (Signed)
 Checking labs today.  Will continue supplements as needed.   - Vitamin D  - Vitamin B12 - TSH - Iron  labs

## 2024-03-06 NOTE — Assessment & Plan Note (Signed)
 Increase Rybelsus  to 14mg  dose.  Checking labs today. Will call pt. With results Will adjust meds if needed based on labs.   -CBC w/Diff -CMP w/eGFR -Hemoglobin A1C

## 2024-03-06 NOTE — Assessment & Plan Note (Signed)
 Blood pressure well controlled with current medications.  Continue current therapy.  Will reassess at follow up.   - CBC w/Diff - CMP w/eGFR

## 2024-03-15 ENCOUNTER — Encounter (HOSPITAL_COMMUNITY): Payer: Self-pay | Admitting: Clinical

## 2024-03-15 ENCOUNTER — Ambulatory Visit (HOSPITAL_COMMUNITY): Admitting: Clinical

## 2024-03-15 DIAGNOSIS — F331 Major depressive disorder, recurrent, moderate: Secondary | ICD-10-CM

## 2024-03-15 DIAGNOSIS — F419 Anxiety disorder, unspecified: Secondary | ICD-10-CM

## 2024-03-15 NOTE — Progress Notes (Signed)
 THERAPIST PROGRESS NOTE  Session Time: 8:00am-8:51am  Session #19  Virtual Visit via Video Note  I connected with Penny Gonzalez on 03/15/24 at  8:00 AM EDT by a video enabled telemedicine application and verified that I am speaking with the correct person using two identifiers.  Location: Patient: car Provider: home office   I discussed the limitations of evaluation and management by telemedicine and the availability of in person appointments. The patient expressed understanding and agreed to proceed.  I discussed the assessment and treatment plan with the patient. The patient was provided an opportunity to ask questions and all were answered. The patient agreed with the plan and demonstrated an understanding of the instructions.   The patient was advised to call back or seek an in-person evaluation if the symptoms worsen or if the condition fails to improve as anticipated.  I provided 51 minutes of non-face-to-face time during this encounter  Elgie JINNY Crest, LCSW   Participation Level: Active  Behavioral Response: Casual Alert Anxious, Depressed, and tearful   Type of Therapy: Individual Therapy  Treatment Goals addressed:  LTG: Increase coping skills to manage depression and improve ability to perform daily activities  LTG: Vista will score less than 9 on the Patient Health Questionnaire (PHQ-9) LTG: Iszabella will score less than 5 on the Generalized Anxiety Disorder 7 Scale (GAD-7)  STG: Identify and decrease cognitive distortions contributing negatively to mood and behavior by identifying 5-7 cognitive distortions that are present; learn how to come up with replacement thoughts that are more balanced, realistic, and helpful.   LTG: Explore personal core beliefs, rules and assumptions, and cognitive distortions through therapist using Cognitive Behavioral Therapy; learn about Behavioral Activation and Acting As If. STG: Learn and practice communication  techniques such as I statements, open-ended questions, reflective listening, assertiveness, fair fighting rules, active listening, initiating conversations, and more as necessary and taught in session.    STG: Learn about boundary types, how to implement them, and how to enforce them so that feels more empowered and content with being able to maintain more helpful, appropriate boundaries in the future for a more balanced result.    LTG: Learn breathing techniques and grounding techniques at an age-appropriate and ability-appropriate level and demonstrate mastery in session then report independent use of these skills out of session.    STG: Learn about detachment from loved one's negative behaviors to be healthier emotionally, physically, and spiritually herself and work to detach from loved ones' poor choices. STG:  Process life events to the extent needed so that can move forward with various areas of life in a better frame of mind.    LTG: Work to Arts development officer from models like CBT, Stages of Change, DBT, shame resilience theory, ACT, SFBT, MI, trauma-informed therapy and others to be able to manage mental health symptoms, AEB practicing out of session and reporting back.    ProgressTowards Goals: Progressing  Interventions: CBT and Supportive   Summary: Penny Gonzalez is a 28 y.o. female who presents with post-partum depression and anxiety for therapy.  She presented oriented x5 and stated she was feeling not good, it hasn't been a good morning, on my way to the hospital because I pulled something in my back and activated my hernia.  CSW evaluated patient's medication compliance, use of coping tools, and self-care, as applicable.  She provided an update on various aspects of her life that are normally discussed in therapy, including that her back injury probably came from  all the moving of furniture and boxes she has been engaging in.  Everything is now officially moved out of her house  and it is on the market.  She is staying with her parents for now, stated this is okay for now, but she could not do it over a long period of time.  She threw away or gave away almost everything in the house, saying that it is time to get rid of those memories and start gathering new ones.  She is hoping and praying the house will sell in 1-2 weeks so that she can start moving into her own apartment with the kids.  She is on FMLA, had planned to take it later but was encouraged to take it now because she got in trouble at work and was told that she was overtly allowing her personal life to come into her work day.  She continues to rely on her faith and feels she is making progress on letting go of the things in her life that she cannot control.  However, she followed that statement by saying, My life is a shit hell hole.  She is experiencing more insomnia and anxiety to the point that her blood pressure is rising.  She is barely sleeping, usually around 10:30pm to 1:00am, finds herself crying at night and overthinking, at times even wondering if she has made the right decisions and whether the whole world is falling apart.  She disclosed that she does not want medication, saying that it makes everything worse.  CSW provided psychoeducation surrounding medicine.  Her cousin has told her to try some type of drops for her anxiety and CSW pointed out that if she does that, she is indeed using a medicine, only one that is likely unregulated.  She has bought and started using a journal, is starting back to the gym next week, and is going on a beach trip.  CSW provided education about Scheduling Worry Time, Thought Stopping, and Thought Clouds.  While she listened, she did not indicate whether she would give any of these a try.  Suicidal/Homicidal: No without intent/plan  Therapist Response: Patient is progressing AEB engaging in scheduled therapy session.  Throughout the session, CSW gave patient the opportunity to  explore thoughts and feelings associated with current life situations and past/present stressors.   CSW challenged patient gently and appropriately to consider different ways of looking at reported issues. CSW encouraged patient's expression of feelings and validated these using empathy, active listening, open body language, and unconditional positive regard.         Plan/Recommendations:  Return to therapy in 3 weeks to next scheduled appointment on 10/24, reflect on what was discussed in session, engage in self care behaviors as explored in session, do homework as assigned (try Thought Stopping, Though Clouds, and Scheduling Worry Time), and return to next session prepared to talk about experience with new coping methods.   Diagnosis:  Major depressive disorder, recurrent episode, moderate degree (HCC)  Anxiety disorder, unspecified type  Collaboration of Care: Primary Care Provider AEB - can see that patient is in therapy although may not be able to read notes  Patient/Guardian was advised Release of Information must be obtained prior to any record release in order to collaborate their care with an outside provider. Patient/Guardian was advised if they have not already done so to contact the registration department to sign all necessary forms in order for us  to release information regarding their care.   Consent: Patient/Guardian  gives verbal consent for treatment and assignment of benefits for services provided during this visit. Patient/Guardian expressed understanding and agreed to proceed.   Elgie JINNY Crest, LCSW 03/15/2024

## 2024-03-21 NOTE — Telephone Encounter (Signed)
 Christy Pack called back and did receive the papers

## 2024-03-25 ENCOUNTER — Ambulatory Visit: Admitting: Family

## 2024-03-25 ENCOUNTER — Encounter: Payer: Self-pay | Admitting: Family

## 2024-03-25 VITALS — BP 108/78 | HR 104 | Ht 68.0 in | Wt 223.6 lb

## 2024-03-25 DIAGNOSIS — E611 Iron deficiency: Secondary | ICD-10-CM

## 2024-03-25 DIAGNOSIS — E1165 Type 2 diabetes mellitus with hyperglycemia: Secondary | ICD-10-CM

## 2024-03-25 DIAGNOSIS — R300 Dysuria: Secondary | ICD-10-CM

## 2024-03-25 DIAGNOSIS — F419 Anxiety disorder, unspecified: Secondary | ICD-10-CM

## 2024-03-25 LAB — POC CREATINE & ALBUMIN,URINE
Albumin/Creatinine Ratio, Urine, POC: <30
Creatinine, POC: 300 mg/dL
Microalbumin Ur, POC: 30 mg/L

## 2024-03-25 LAB — POCT URINALYSIS DIPSTICK
Bilirubin, UA: NEGATIVE
Blood, UA: NEGATIVE
Glucose, UA: NEGATIVE
Ketones, UA: NEGATIVE
Nitrite, UA: NEGATIVE
Protein, UA: NEGATIVE
Spec Grav, UA: 1.025 (ref 1.010–1.025)
Urobilinogen, UA: 0.2 U/dL
pH, UA: 7 (ref 5.0–8.0)

## 2024-03-25 MED ORDER — VITAMIN D (ERGOCALCIFEROL) 1.25 MG (50000 UNIT) PO CAPS: 50000.0000 [IU] | ORAL_CAPSULE | ORAL | 3 refills | Status: AC

## 2024-03-25 NOTE — Progress Notes (Signed)
 Established Patient Office Visit  Subjective:  Patient ID: Penny Gonzalez, female    DOB: Mar 27, 1996  Age: 28 y.o. MRN: [id number]  Chief Complaint  Patient presents with   Follow-up    1 month follow up    Patient is here today for her 1 month follow up.  She has been feeling fairly well since last appointment.   She does have additional concerns to discuss today.  She still has issues with her anxiety level, though she does say that her house is under contract, which is a stressor that has gone away.  She did go to the hospital for issues with constipation recently, was having back pain and was found to have significant stool burden.   Labs are not due today.  She needs refills.   I have reviewed her active problem list, medication list, allergies, notes from last encounter, lab results for her appointment today.      No other concerns at this time.   Past Medical History:  Diagnosis Date   Anemia, iron deficiency, inadequate dietary intake 12/01/2014   Depression    patient has not been officially diagnosed but has symptoms   Diabetes mellitus without complication (HCC)    Recently changed to Glyburide  and thinks it is helping   Essential hypertension, benign 03/31/2016   Fetal macrosomia affecting management of mother, antepartum 10/16/2022   History of pre-eclampsia in prior pregnancy, currently pregnant 05/11/2022   History of preterm delivery, currently pregnant 05/11/2022   Irregular periods/menstrual cycles    Joint pain of leg    Major depression 11/23/2015   Obesity in pregnancy 11/29/2017   Obesity in pregnancy, antepartum 04/22/2022   Positive GBS test 10/06/2022   Pre-existing diabetes mellitus during pregnancy in third trimester 06/24/2014   Pre-existing diabetes mellitus in pregnancy 10/15/2022   Pre-existing essential hypertension during pregnancy in third trimester 09/30/2022   Preexisting diabetes complicating pregnancy, antepartum 07/27/2022    Pregnancy with type 2 diabetes mellitus in first trimester 04/22/2022   Sciatica    Supervision of normal pregnancy 04/19/2022   28 y.o. [id number], at [redacted]w[redacted]d based on LMP of 01/29/22, with an Estimated Date of Delivery: 11/05/22.     Sex of baby and name:      Partner:    [name]     Factors complicating this pregnancy   Deliveries at 61 week x2     Obesity BMI:   Baseline labs:  Early 1 hour GTT: n/a  P/C ratio:  79  CMP: wnl  A1c: 6.5     H/o Preeclampsia,   ASA at 12 weeks (d/c 35-37 weeks) - discussed 04/21/22     Diabetes     Past Surgical History:  Procedure Laterality Date   CHOLECYSTECTOMY N/A 05/19/2016   Procedure: LAPAROSCOPIC CHOLECYSTECTOMY WITH INTRAOPERATIVE CHOLANGIOGRAM;  Surgeon: Larinda Unknown Sharps, MD;  Location: [facility] ORS;  Service: General;  Laterality: N/A;    Social History   Socioeconomic History   Marital status: Single    Spouse name: Not on file   Number of children: 1   Years of education: 13   Highest education level: High school graduate  Occupational History   Occupation: [employer]  Tobacco Use   Smoking status: Never    Passive exposure: Never   Smokeless tobacco: Never  Vaping Use   Vaping status: Never Used  Substance and Sexual Activity   Alcohol use: Not Currently    Comment: OCC   Drug use: No   Sexual  activity: Yes    Partners: Male    Birth control/protection: Surgical    Comment: tubal  Other Topics Concern   Not on file  Social History Narrative   Not on file   Social Drivers of Health   Financial Resource Strain: Low Risk  (04/28/2022)   Overall Financial Resource Strain (CARDIA)    Difficulty of Paying Living Expenses: Not hard at all  Food Insecurity: No Food Insecurity (10/15/2022)   Hunger Vital Sign    Worried About Running Out of Food in the Last Year: Never true    Ran Out of Food in the Last Year: Never true  Transportation Needs: No Transportation Needs (10/15/2022)   PRAPARE - Administrator, Civil Service  (Medical): No    Lack of Transportation (Non-Medical): No  Physical Activity: Inactive (04/28/2022)   Exercise Vital Sign    Days of Exercise per Week: 0 days    Minutes of Exercise per Session: 0 min  Stress: No Stress Concern Present (04/28/2022)   [employer] of Occupational Health - Occupational Stress Questionnaire    Feeling of Stress : Only a little  Social Connections: Moderately Integrated (04/28/2022)   Social Connection and Isolation Panel    Frequency of Communication with Friends and Family: More than three times a week    Frequency of Social Gatherings with Friends and Family: Once a week    Attends Religious Services: 1 to 4 times per year    Active Member of [employer] or Organizations: No    Attends Banker Meetings: Never    Marital Status: Living with partner  Intimate Partner Violence: Unknown (10/15/2022)   Humiliation, Afraid, Rape, and Kick questionnaire    Fear of Current or Ex-Partner: No    Emotionally Abused: No    Physically Abused: Not on file    Sexually Abused: Not on file    Family History  Problem Relation Age of Onset   Diabetes Mother    Arthritis Father    Diabetes Father    Heart disease Father    Hypertension Father    Stroke Father    Lupus Sister    Deafness Sister    Diabetes Maternal Grandmother    Hypertension Maternal Grandmother    Heart disease Paternal Grandmother    Hypertension Paternal Grandmother     Allergies  Allergen Reactions   Latex Rash    Pruritic, urticaria    Review of Systems  Gastrointestinal:  Positive for constipation.  All other systems reviewed and are negative.      Objective:   BP 108/78   Pulse (!) 104   Ht 5' 8 (1.727 m)   Wt 223 lb 9.6 oz (101.4 kg)   SpO2 99%   BMI 34.00 kg/m   Vitals:   03/25/24 1418  BP: 108/78  Pulse: (!) 104  Height: 5' 8 (1.727 m)  Weight: 223 lb 9.6 oz (101.4 kg)  SpO2: 99%  BMI (Calculated): 34.01    Physical Exam Vitals and nursing  note reviewed.  Constitutional:      Appearance: Normal appearance. She is normal weight.  HENT:     Head: Normocephalic.  Eyes:     Extraocular Movements: Extraocular movements intact.     Conjunctiva/sclera: Conjunctivae normal.     Pupils: Pupils are equal, round, and reactive to light.  Cardiovascular:     Rate and Rhythm: Normal rate.  Pulmonary:     Effort: Pulmonary effort is normal.  Neurological:  General: No focal deficit present.     Mental Status: She is alert and oriented to person, place, and time. Mental status is at baseline.  Psychiatric:        Mood and Affect: Mood normal.        Behavior: Behavior normal.        Thought Content: Thought content normal.      Results for orders placed or performed in visit on 03/25/24  POCT Urinalysis Dipstick (81002)  Result Value Ref Range   Color, UA Yellow    Clarity, UA Clear    Glucose, UA Negative Negative   Bilirubin, UA Negative    Ketones, UA Negative    Spec Grav, UA 1.025 1.010 - 1.025   Blood, UA Negative    pH, UA 7.0 5.0 - 8.0   Protein, UA Negative Negative   Urobilinogen, UA 0.2 0.2 or 1.0 E.U./dL   Nitrite, UA Negative    Leukocytes, UA Trace (A) Negative   Appearance Clear    Odor Yes   POC CREATINE & ALBUMIN,URINE  Result Value Ref Range   Microalbumin Ur, POC 30 mg/L   Creatinine, POC 300 mg/dL   Albumin/Creatinine Ratio, Urine, POC <30     Recent Results (from the past 2160 hours)  NuSwab VG Plus+Mycoplasmas,NAA     Status: Abnormal   Collection Time: 02/28/24  2:06 PM   Specimen: Vaginal Swab   VA  Result Value Ref Range   Atopobium vaginae Low - 0 Score   BVAB 2 Low - 0 Score   Megasphaera 1 Low - 0 Score    Comment: Calculate total score by adding the 3 individual bacterial vaginosis (BV) marker scores together.  Total score is interpreted as follows: Total score 0-1: Indicates the absence of BV. Total score   2: Indeterminate for BV. Additional clinical                  data  should be evaluated to establish a                  diagnosis. Total score 3-6: Indicates the presence of BV.    Candida albicans, NAA Negative Negative   Candida glabrata, NAA Negative Negative   Trich vag by NAA Negative Negative   Chlamydia trachomatis, NAA Negative Negative   Neisseria gonorrhoeae, NAA Negative Negative   Mycoplasma genitalium NAA Positive (A) Negative   Mycoplasma hominis NAA Negative Negative   Ureaplasma spp NAA Negative Negative  HSV 1 and 2 Ab, IgG     Status: Abnormal   Collection Time: 02/28/24  2:06 PM  Result Value Ref Range   HSV 1 Glycoprotein G Ab, IgG Reactive (A) Non Reactive    Comment: **Please note reference interval change** HSV-1 IgG testing performed using the Roche Elecsys HSV-1 IgG assay.    HSV 2 IgG, Type Spec Non Reactive Non Reactive    Comment: **Please note reference interval change** Current guidelines and recommendations do not recommend routine screening for HSV-2 in asymptomatic individuals, including those that are pregnant. The detection of HSV-2 IgG antibodies in a single sample indicates previous exposure to HSV-2 but does not give information as to the site of HSV infection or the timing of exposure. The predictive value of positive and negative results depends on the population's prevalence and the pretest likelihood of HSV-2. HSV-2 IgG testing performed using the Roche Elecsys HSV-2 IgG assay.   RPR w/reflex to TrepSure     Status: None  Collection Time: 02/28/24  2:06 PM  Result Value Ref Range   RPR Non Reactive Non Reactive  Acute Viral Hepatitis (HAV, HBV, HCV)     Status: None   Collection Time: 02/28/24  2:06 PM  Result Value Ref Range   Hep A IgM Negative Negative    Comment: A negative anti-HAV IgM result suggests no recent or current HAV infection.    Hepatitis B Surface Ag Negative Negative   Hep B C IgM Negative Negative   HCV Ab Non Reactive Non Reactive  HIV Antibody (routine testing w rflx)      Status: None   Collection Time: 02/28/24  2:06 PM  Result Value Ref Range   HIV Screen 4th Generation wRfx Non Reactive Non Reactive    Comment: HIV-1/HIV-2 antibodies and HIV-1 p24 antigen were NOT detected. There is no laboratory evidence of HIV infection. HIV Negative   Lipid panel     Status: None   Collection Time: 02/28/24  2:06 PM  Result Value Ref Range   Cholesterol, Total 154 100 - 199 mg/dL   Triglycerides 851 0 - 149 mg/dL   HDL 55 >60 mg/dL   VLDL Cholesterol Cal 25 5 - 40 mg/dL   LDL Chol Calc (NIH) 74 0 - 99 mg/dL   Chol/HDL Ratio 2.8 0.0 - 4.4 ratio    Comment:                                   T. Chol/HDL Ratio                                             Men  Women                               1/2 Avg.Risk  3.4    3.3                                   Avg.Risk  5.0    4.4                                2X Avg.Risk  9.6    7.1                                3X Avg.Risk 23.4   11.0   VITAMIN D  25 Hydroxy (Vit-D Deficiency, Fractures)     Status: Abnormal   Collection Time: 02/28/24  2:06 PM  Result Value Ref Range   Vit D, 25-Hydroxy 22.4 (L) 30.0 - 100.0 ng/mL    Comment: Vitamin D  deficiency has been defined by the Institute of Medicine and an Endocrine Society practice guideline as a level of serum 25-OH vitamin D  less than 20 ng/mL (1,2). The Endocrine Society went on to further define vitamin D  insufficiency as a level between 21 and 29 ng/mL (2). 1. IOM (Institute of Medicine). 2010. Dietary reference    intakes for calcium  and D. [place]: The    [employer]. 2. Holick MF, Binkley [place], Bischoff-Ferrari HA, et al.  Evaluation, treatment, and prevention of vitamin D     deficiency: an Endocrine Society clinical practice    guideline. JCEM. 2011 Jul; 96(7):1911-30.   CMP14+EGFR     Status: Abnormal   Collection Time: 02/28/24  2:06 PM  Result Value Ref Range   Glucose 142 (H) 70 - 99 mg/dL   BUN 7 6 - 20 mg/dL   Creatinine, Ser 9.37  0.57 - 1.00 mg/dL   eGFR 876 >40 fO/fpw/8.26   BUN/Creatinine Ratio 11 9 - 23   Sodium 139 134 - 144 mmol/L   Potassium 3.8 3.5 - 5.2 mmol/L   Chloride 104 96 - 106 mmol/L   CO2 21 20 - 29 mmol/L   Calcium  8.9 8.7 - 10.2 mg/dL   Total Protein 7.1 6.0 - 8.5 g/dL   Albumin 4.5 4.0 - 5.0 g/dL   Globulin, Total 2.6 1.5 - 4.5 g/dL   Bilirubin Total 0.3 0.0 - 1.2 mg/dL   Alkaline Phosphatase 60 41 - 116 IU/L    Comment:               **Please note reference interval change**   AST 18 0 - 40 IU/L   ALT 13 0 - 32 IU/L  TSH     Status: None   Collection Time: 02/28/24  2:06 PM  Result Value Ref Range   TSH 0.524 0.450 - 4.500 uIU/mL  Hemoglobin A1c     Status: Abnormal   Collection Time: 02/28/24  2:06 PM  Result Value Ref Range   Hgb A1c MFr Bld 6.3 (H) 4.8 - 5.6 %    Comment:          Prediabetes: 5.7 - 6.4          Diabetes: >6.4          Glycemic control for adults with diabetes: <7.0    Est. average glucose Bld gHb Est-mCnc 134 mg/dL  Vitamin A87     Status: None   Collection Time: 02/28/24  2:06 PM  Result Value Ref Range   Vitamin B-12 597 232 - 1,245 pg/mL  CBC with Diff     Status: Abnormal   Collection Time: 02/28/24  2:06 PM  Result Value Ref Range   WBC 6.3 3.4 - 10.8 x10E3/uL   RBC 4.37 3.77 - 5.28 x10E6/uL   Hemoglobin 11.5 11.1 - 15.9 g/dL   Hematocrit 62.4 65.9 - 46.6 %   MCV 86 79 - 97 fL   MCH 26.3 (L) 26.6 - 33.0 pg   MCHC 30.7 (L) 31.5 - 35.7 g/dL   RDW 85.0 88.2 - 84.5 %   Platelets 293 150 - 450 x10E3/uL   Neutrophils 53 Not Estab. %   Lymphs 40 Not Estab. %   Monocytes 6 Not Estab. %   Eos 1 Not Estab. %   Basos 0 Not Estab. %   Neutrophils Absolute 3.3 1.4 - 7.0 x10E3/uL   Lymphocytes Absolute 2.5 0.7 - 3.1 x10E3/uL   Monocytes Absolute 0.4 0.1 - 0.9 x10E3/uL   EOS (ABSOLUTE) 0.1 0.0 - 0.4 x10E3/uL   Basophils Absolute 0.0 0.0 - 0.2 x10E3/uL   Immature Granulocytes 0 Not Estab. %   Immature Grans (Abs) 0.0 0.0 - 0.1 x10E3/uL  Iron, TIBC and  Ferritin Panel     Status: Abnormal   Collection Time: 02/28/24  2:06 PM  Result Value Ref Range   Total Iron Binding Capacity 431 250 - 450 ug/dL   UIBC 600 868 - 574 ug/dL  Iron 32 27 - 159 ug/dL   Iron Saturation 7 (LL) 15 - 55 %   Ferritin 9 (L) 15 - 150 ng/mL  Interpretation:     Status: None   Collection Time: 02/28/24  2:06 PM  Result Value Ref Range   HCV Interp 1: Comment     Comment: Not infected with HCV unless early or acute infection is suspected (which may be delayed in an immunocompromised individual), or other evidence exists to indicate HCV infection.   Treponemal Antibodies, TPPA     Status: None   Collection Time: 02/28/24  2:06 PM  Result Value Ref Range   Treponemal Antibodies, TPPA Non Reactive Non Reactive   Interpretation: Comment     Comment: Syphilis: Treponemal Antibodies with Reflex to RPR and RPR           Titer, Reverse Screening and Diagnosis Algorithm ------------------------------------------------------------ Treponemal              Treponemal     Ab       RPR, Qn     Ab, TPPA    Final Interpretation ----------   --------   ----------   ----------------------- Non          N/A        N/A          No laboratory evidence Reactive                             of syphilis. Retest in                                      2-4 weeks if recent                                      exposure is suspected. ----------   --------   ----------   ----------------------- Reactive     Non        Non          Treponemal antibodies              Reactive   Reactive     not confirmed.                                      Inconclusive for                                      syphilis; potential                                      early syphilis,                                      possible false                                       positive. Retest  in 2-4                                      weeks if recent                                      exposure is  suspected. ----------   --------   ----------   ----------------------- Reactive     Non        Reactive     Treponemal antibodies              Reactive                detected. Consistent                                      with past or current                                      (potential early)                                      syphilis. ----------   --------   ----------   ----------------------- Reactive     >/=1:1     N/A          Treponemal and                                      nontreponemal                                      antibodies detected.                                      Consistent with current                                      or past syphilis. This test is intended ONLY for specimens that have tested positive (reactive) or equivocal for Treponema pallidum antibodies prior to submission for testing.  For the full CDC-recommended syphilis screening and diagnosis algorithm, [facility] offers test code 012005 RPR, Rfx Qn RPR/Confirm TP or 917654 T pallidum Screening [facility]ascade.   POC CREATINE & ALBUMIN,URINE     Status: None   Collection Time: 03/25/24  3:33 PM  Result Value Ref Range   Microalbumin Ur, POC 30 mg/L   Creatinine, POC 300 mg/dL   Albumin/Creatinine Ratio, Urine, POC <30   POCT Urinalysis Dipstick (18997)     Status: Abnormal   Collection Time: 03/25/24  3:34 PM  Result Value Ref Range   Color, UA Yellow    Clarity, UA Clear    Glucose, UA Negative Negative   Bilirubin, UA Negative    Ketones, UA Negative  Spec Grav, UA 1.025 1.010 - 1.025   Blood, UA Negative    pH, UA 7.0 5.0 - 8.0   Protein, UA Negative Negative   Urobilinogen, UA 0.2 0.2 or 1.0 E.U./dL   Nitrite, UA Negative    Leukocytes, UA Trace (A) Negative   Appearance Clear    Odor Yes        Assessment & Plan Iron deficiency Setting patient up for referral to hematology .  Will defer to them for further treatment changes.  Reassess at follow up.  Type 2 diabetes  mellitus with hyperglycemia, without long-term current use of insulin  (HCC) Microalbumin performed in office today. - WNL Continue current diabetes POC, as patient has been well controlled on current regimen.  Will adjust meds if needed based on labs.   Dysuria UA in office today improved from the ED.  Pt will let me know if she does not continue to improve.   Anxiety Patient is improving, will reassess at follow up.     Return in about 1 month (around 04/25/2024).   Total time spent: 20 minutes  ALAN CHRISTELLA ARRANT, FNP  03/25/2024   This document may have been prepared by [facility] Voice Recognition software and as such may include unintentional dictation errors.

## 2024-03-25 NOTE — Assessment & Plan Note (Signed)
 Microalbumin performed in office today. - WNL Continue current diabetes POC, as patient has been well controlled on current regimen.  Will adjust meds if needed based on labs.

## 2024-03-29 ENCOUNTER — Ambulatory Visit: Admitting: Family

## 2024-04-02 ENCOUNTER — Inpatient Hospital Stay

## 2024-04-02 ENCOUNTER — Inpatient Hospital Stay: Attending: Oncology | Admitting: Oncology

## 2024-04-02 ENCOUNTER — Encounter: Payer: Self-pay | Admitting: Oncology

## 2024-04-02 VITALS — BP 120/90 | HR 111 | Temp 98.7°F | Resp 18 | Ht 68.0 in | Wt 225.0 lb

## 2024-04-02 DIAGNOSIS — D509 Iron deficiency anemia, unspecified: Secondary | ICD-10-CM | POA: Insufficient documentation

## 2024-04-02 DIAGNOSIS — I10 Essential (primary) hypertension: Secondary | ICD-10-CM | POA: Insufficient documentation

## 2024-04-02 DIAGNOSIS — E119 Type 2 diabetes mellitus without complications: Secondary | ICD-10-CM | POA: Diagnosis not present

## 2024-04-02 LAB — CBC (CANCER CENTER ONLY)
HCT: 32.9 % — ABNORMAL LOW (ref 36.0–46.0)
Hemoglobin: 10.5 g/dL — ABNORMAL LOW (ref 12.0–15.0)
MCH: 26 pg (ref 26.0–34.0)
MCHC: 31.9 g/dL (ref 30.0–36.0)
MCV: 81.4 fL (ref 80.0–100.0)
Platelet Count: 289 K/uL (ref 150–400)
RBC: 4.04 MIL/uL (ref 3.87–5.11)
RDW: 15.1 % (ref 11.5–15.5)
WBC Count: 5.5 K/uL (ref 4.0–10.5)
nRBC: 0 % (ref 0.0–0.2)

## 2024-04-02 LAB — VITAMIN B12: Vitamin B-12: 359 pg/mL (ref 180–914)

## 2024-04-02 LAB — FERRITIN: Ferritin: 5 ng/mL — ABNORMAL LOW (ref 11–307)

## 2024-04-02 LAB — IRON AND TIBC
Iron: 40 ug/dL (ref 28–170)
Saturation Ratios: 8 % — ABNORMAL LOW (ref 10.4–31.8)
TIBC: 504 ug/dL — ABNORMAL HIGH (ref 250–450)
UIBC: 464 ug/dL

## 2024-04-02 LAB — FOLATE: Folate: 17.1 ng/mL (ref >5.9)

## 2024-04-02 NOTE — Progress Notes (Unsigned)
 [facility]  Telephone:([phone]) [phone] Fax:(336) [phone]  ID: Penny Gonzalez OB: Sep 12, 1995  MR#: [id number]  RDW#:[id number]  Patient Care Team: Orlean Alan HERO, FNP as PCP - General (Family Medicine) Ward, Mitzie BROCKS, MD as Consulting Physician (Obstetrics and Gynecology) Alpha Chloe SAUNDERS, MD as Consulting Physician (Dermatology)  CHIEF COMPLAINT: Iron deficiency anemia.  INTERVAL HISTORY: Patient is a 28 year old female who was noted to have a reduced hemoglobin and iron stores on routine blood work.  She is referred for further evaluation and consideration of IV iron.  She currently feels well and is asymptomatic.  She does not have any weakness or fatigue.  She has good appetite and denies weight loss.  She has no neurologic complaints.  She denies any recent fevers or illnesses.  She has no chest pain, shortness of breath, cough, or hemoptysis.  She denies any nausea, vomiting, constipation, or diarrhea.  She has no melena or hematochezia.  She has no urinary complaints.  Patient offers no further specific complaints today.  REVIEW OF SYSTEMS:   Review of Systems  Constitutional: Negative.  Negative for fever, malaise/fatigue and weight loss.  Respiratory: Negative.  Negative for cough, hemoptysis and shortness of breath.   Cardiovascular: Negative.  Negative for chest pain and leg swelling.  Gastrointestinal: Negative.  Negative for abdominal pain, blood in stool and melena.  Genitourinary: Negative.  Negative for dysuria.  Musculoskeletal: Negative.  Negative for back pain.  Skin: Negative.  Negative for rash.  Neurological: Negative.  Negative for dizziness, focal weakness, weakness and headaches.  Psychiatric/Behavioral: Negative.  The patient is not nervous/anxious.     As per HPI. Otherwise, a complete review of systems is negative.  PAST MEDICAL HISTORY: Past Medical History:  Diagnosis Date   Anemia, iron deficiency, inadequate dietary intake  12/01/2014   Depression    patient has not been officially diagnosed but has symptoms   Diabetes mellitus without complication (HCC)    Recently changed to Glyburide  and thinks it is helping   Essential hypertension, benign 03/31/2016   Fetal macrosomia affecting management of mother, antepartum 10/16/2022   History of pre-eclampsia in prior pregnancy, currently pregnant 05/11/2022   History of preterm delivery, currently pregnant 05/11/2022   Irregular periods/menstrual cycles    Joint pain of leg    Major depression 11/23/2015   Obesity in pregnancy 11/29/2017   Obesity in pregnancy, antepartum 04/22/2022   Positive GBS test 10/06/2022   Pre-existing diabetes mellitus during pregnancy in third trimester 06/24/2014   Pre-existing diabetes mellitus in pregnancy 10/15/2022   Pre-existing essential hypertension during pregnancy in third trimester 09/30/2022   Preexisting diabetes complicating pregnancy, antepartum 07/27/2022   Pregnancy with type 2 diabetes mellitus in first trimester 04/22/2022   Sciatica    Supervision of normal pregnancy 04/19/2022   28 y.o. [id number], at [redacted]w[redacted]d based on LMP of 01/29/22, with an Estimated Date of Delivery: 11/05/22.     Sex of baby and name:      Partner:    [name]     Factors complicating this pregnancy   Deliveries at 53 week x2     Obesity BMI:   Baseline labs:  Early 1 hour GTT: n/a  P/C ratio:  79  CMP: wnl  A1c: 6.5     H/o Preeclampsia,   ASA at 12 weeks (d/c 35-37 weeks) - discussed 04/21/22     Diabetes     PAST SURGICAL HISTORY: Past Surgical History:  Procedure Laterality Date   CHOLECYSTECTOMY N/A  05/19/2016   Procedure: LAPAROSCOPIC CHOLECYSTECTOMY WITH INTRAOPERATIVE CHOLANGIOGRAM;  Surgeon: Larinda Unknown Sharps, MD;  Location: [facility] ORS;  Service: General;  Laterality: N/A;    FAMILY HISTORY: Family History  Problem Relation Age of Onset   Diabetes Mother    Arthritis Father    Diabetes Father    Heart disease Father    Hypertension  Father    Stroke Father    Lupus Sister    Deafness Sister    Diabetes Maternal Grandmother    Hypertension Maternal Grandmother    Heart disease Paternal Grandmother    Hypertension Paternal Grandmother     ADVANCED DIRECTIVES (Y/N):  N  HEALTH MAINTENANCE: Social History   Tobacco Use   Smoking status: Never    Passive exposure: Never   Smokeless tobacco: Never  Vaping Use   Vaping status: Never Used  Substance Use Topics   Alcohol use: Not Currently    Comment: OCC   Drug use: No     Colonoscopy:  PAP:  Bone density:  Lipid panel:  Allergies  Allergen Reactions   Latex Rash    Pruritic, urticaria    Current Outpatient Medications  Medication Sig Dispense Refill   Semaglutide  (RYBELSUS ) 14 MG TABS Take 1 tablet (14 mg total) by mouth daily. 30 tablet 2   Vitamin D , Ergocalciferol , (DRISDOL ) 1.25 MG (50000 UNIT) CAPS capsule Take 1 capsule (50,000 Units total) by mouth every 7 (seven) days. 12 capsule 3   No current facility-administered medications for this visit.    OBJECTIVE: Vitals:   04/02/24 1135  BP: (!) 120/90  Pulse: (!) 111  Resp: 18  Temp: 98.7 F (37.1 C)  SpO2: 100%     Body mass index is 34.21 kg/m.    ECOG FS:0 - Asymptomatic  General: Well-developed, well-nourished, no acute distress. Eyes: Pink conjunctiva, anicteric sclera. HEENT: Normocephalic, moist mucous membranes. Lungs: No audible wheezing or coughing. Heart: Regular rate and rhythm. Abdomen: Soft, nontender, no obvious distention. Musculoskeletal: No edema, cyanosis, or clubbing. Neuro: Alert, answering all questions appropriately. Cranial nerves grossly intact. Skin: No rashes or petechiae noted. Psych: Normal affect. Lymphatics: No cervical, calvicular, axillary or inguinal LAD.   LAB RESULTS:  Lab Results  Component Value Date   NA 139 02/28/2024   K 3.8 02/28/2024   CL 104 02/28/2024   CO2 21 02/28/2024   GLUCOSE 142 (H) 02/28/2024   BUN 7 02/28/2024    CREATININE 0.62 02/28/2024   CALCIUM  8.9 02/28/2024   PROT 7.1 02/28/2024   ALBUMIN 4.5 02/28/2024   AST 18 02/28/2024   ALT 13 02/28/2024   ALKPHOS 60 02/28/2024   BILITOT 0.3 02/28/2024   GFRNONAA >60 10/15/2022   GFRAA 140 04/10/2019    Lab Results  Component Value Date   WBC 5.5 04/02/2024   NEUTROABS 3.3 02/28/2024   HGB 10.5 (L) 04/02/2024   HCT 32.9 (L) 04/02/2024   MCV 81.4 04/02/2024   PLT 289 04/02/2024   Lab Results  Component Value Date   IRON 40 04/02/2024   TIBC 504 (H) 04/02/2024   IRONPCTSAT 8 (L) 04/02/2024   Lab Results  Component Value Date   FERRITIN 5 (L) 04/02/2024     STUDIES: No results found.  ASSESSMENT: Iron deficiency anemia.  PLAN:    Iron deficiency anemia: Patient's hemoglobin and iron stores are decreased and she will benefit from 200 mg of IV Venofer.  Folate and B12 levels are within normal limits.  Return to clinic 4 times over  the next 1 to 2 weeks to receive treatment.  Patient will then return to clinic in 4 months with repeat laboratory, further evaluation, and continuation of Venofer if necessary.  I spent a total of 45 minutes reviewing chart data, face-to-face evaluation with the patient, counseling and coordination of care as detailed above.   Patient expressed understanding and was in agreement with this plan. She also understands that She can call clinic at any time with any questions, concerns, or complaints.     Evalene JINNY Reusing, MD   04/03/2024 9:36 AM

## 2024-04-03 ENCOUNTER — Encounter: Payer: Self-pay | Admitting: Oncology

## 2024-04-04 ENCOUNTER — Inpatient Hospital Stay

## 2024-04-04 VITALS — BP 133/96 | HR 86 | Temp 97.0°F | Resp 18

## 2024-04-04 DIAGNOSIS — D509 Iron deficiency anemia, unspecified: Secondary | ICD-10-CM

## 2024-04-04 MED ORDER — SODIUM CHLORIDE 0.9% FLUSH
10.0000 mL | Freq: Once | INTRAVENOUS | Status: AC | PRN
  Administered 2024-04-04: 10 mL
  Filled 2024-04-04: qty 10

## 2024-04-04 MED ORDER — IRON SUCROSE 20 MG/ML IV SOLN
200.0000 mg | Freq: Once | INTRAVENOUS | Status: AC
  Administered 2024-04-04: 200 mg via INTRAVENOUS
  Filled 2024-04-04: qty 10

## 2024-04-05 ENCOUNTER — Encounter (HOSPITAL_COMMUNITY): Payer: Self-pay | Admitting: Clinical

## 2024-04-05 ENCOUNTER — Ambulatory Visit (INDEPENDENT_AMBULATORY_CARE_PROVIDER_SITE_OTHER): Admitting: Clinical

## 2024-04-05 DIAGNOSIS — Z630 Problems in relationship with spouse or partner: Secondary | ICD-10-CM

## 2024-04-05 DIAGNOSIS — F331 Major depressive disorder, recurrent, moderate: Secondary | ICD-10-CM

## 2024-04-05 DIAGNOSIS — F419 Anxiety disorder, unspecified: Secondary | ICD-10-CM | POA: Diagnosis not present

## 2024-04-05 NOTE — Progress Notes (Signed)
 THERAPIST PROGRESS NOTE  Session Time: 9:04am-10:01am  Session #20  Virtual Visit via Video Note  I connected with Penny Gonzalez on 04/05/24 at  9:00 AM EDT by a video enabled telemedicine application and verified that I am speaking with the correct person using two identifiers.  Location: Patient: car Provider: home office   I discussed the limitations of evaluation and management by telemedicine and the availability of in person appointments. The patient expressed understanding and agreed to proceed.  I discussed the assessment and treatment plan with the patient. The patient was provided an opportunity to ask questions and all were answered. The patient agreed with the plan and demonstrated an understanding of the instructions.   The patient was advised to call back or seek an in-person evaluation if the symptoms worsen or if the condition fails to improve as anticipated.  I provided 57 minutes of non-face-to-face time during this encounter  Elgie JINNY Crest, LCSW   Participation Level: Active  Behavioral Response: Casual Alert Anxious, Depressed, and tearful   Type of Therapy: Individual Therapy  Treatment Goals addressed:  New treatment goals established, current goals reviewed:  STG: Identify and decrease cognitive distortions contributing negatively to mood and behavior by identifying 5-7 cognitive distortions that are present; learn how to come up with replacement thoughts that are more balanced, realistic, and helpful.    LTG: Explore personal core beliefs, rules and assumptions, and cognitive distortions through therapist using Cognitive Behavioral Therapy; learn about Behavioral Activation and Acting As If.   STG: Learn and practice communication techniques such as I statements, open-ended questions, reflective listening, assertiveness, fair fighting rules, active listening, initiating conversations, and more as necessary and taught in session.    STG:  Learn about detachment from loved one's negative behaviors to be healthier emotionally, physically, and spiritually herself and work to detach from loved ones' poor choices.   LTG: Score less than 9 on the PHQ-9 and less than 5 on the GAD-7 as evidenced by intermittent administration of the questionnaires to determine progress in managing depression and anxiety.  STG:  Process life events to the extent needed so that will be able to move forward with various areas of life in a better frame of mind per self-report of improved satisfaction with life 5 out of 7 days over the next 6 months.  LTG: Learn a variety of breathing techniques and grounding strategies, practice in session then report independent application out of session 2-4 times per month or more often, if needed.  STG: Learn about 3 boundary styles and 6-8 types, how to implement them, and how to enforce them, then report feeling more empowered and content with being able to maintain more helpful, appropriate boundaries in the future for a more balanced result.  LTG: Improve self-esteem by engaging in daily affirmations, developing new skills, gratitude journaling, use of SMART goals, increased assertiveness, challenging negative beliefs, and focusing on what patient can control  LTG: Work to learn 10+ coping skills from models like CBT, Stages of Change, DBT, shame resilience theory, ACT, SFBT, MI, trauma-informed therapy and others to be able to manage mental health symptoms, AEB practicing out of session and reporting back.  ProgressTowards Goals: Progressing  Interventions: CBT, Psychosocial Skills: detachment, Supportive, and Other: treatment planning  Summary: Penny Gonzalez is a 28 y.o. female who presents with post-partum depression and anxiety for therapy.  She presented oriented x5 and stated she was feeling I'm a hot mess, I'm going crazy.  CSW evaluated  patient's medication compliance, use of coping tools, and self-care, as  applicable.  She provided an update on various aspects of her life that are normally discussed in therapy, including the various things in her life that are not going according to plan and making her feel she is going 1 step forward and 10 backward.  CSW was able through the course of the session to point that she is going 1 step forward in one area, then 1 step forward in another area, etc., and that her perception is not actually accurate.  The CBT model was used to illustrate this.  Small things are unmanageable right now, such as not being approved for her leave of absence to the desired date because [employer] has not yet received her medical records from this practice.  As an aside, after session, CSW spoke with [employer] and provided them the correct number for HIM and informed them patient did sign ROI.  Patient is concerned she may not get the apartment she wants due to income restrictions, but has already done what is necessary to move that in the correct direction.  She expressed concern about getting her big furniture out of the house by the closing date on the house of 10/30.  CSW pointed out the positives in each of these and asked her to reflect on them a bit, which helped her to reassess the validity of her thoughts that nothing is going her way, leading to the feelings of defeat.  Her baby's father is now trying to get back together with her, but she spent little time talking about this other than to say that he finally opened up about everything he had been doing, which she stated was only because she had already found out everything.  She is not entertaining a thought of having him back in her life at this point.  While living with parents while father is going through medical issues, she has found that he has suicidal ideation and blames her son for being the one reason he cannot kill himself.  CSW helped her to rephrase this from blame to a more positive way to look at it.  She was able to  self-evaluate that a lot of what she is feeling is because things are not happening in the timeframe she wants.  We discussed finding joy in the weekend days coming up in order to remind herself that there is more to life than all the mundane tasks she is currently involved in.  We also talked about how it has been going with scheduling worry time, and she shared that she has been doing this in early afternoon, not wanting to set a negative tone for the day by doing it in the morning or diminish her sleep quality by doing it at night.  She was encouraged to focus on gratitude and appreciation for all that is going right, was in agreement that this will be helpful.  She expressed how much therapy sessions are helping her.  Suicidal/Homicidal: No without intent/plan  Therapist Response: Patient is progressing AEB engaging in scheduled therapy session.  Throughout the session, CSW gave patient the opportunity to explore thoughts and feelings associated with current life situations and past/present stressors.   CSW challenged patient gently and appropriately to consider different ways of looking at reported issues. CSW encouraged patient's expression of feelings and validated these using empathy, active listening, open body language, and unconditional positive regard.         Plan/Recommendations:  Return to therapy in 3 weeks to next scheduled appointment on 11/7, reflect on what was discussed in session, engage in self care behaviors as explored in session, do homework as assigned (look just today for ways to create joy, continue doing Thought Stopping, Though Clouds, and Scheduling Worry Time), and return to next session prepared to talk about experience with new coping methods.   Diagnosis:  Major depressive disorder, recurrent episode, moderate degree (HCC)  Anxiety disorder, unspecified type  Relationship problem between partners  Collaboration of Care: Primary Care Provider AEB - can see that  patient is in therapy although may not be able to read notes  Patient/Guardian was advised Release of Information must be obtained prior to any record release in order to collaborate their care with an outside provider. Patient/Guardian was advised if they have not already done so to contact the registration department to sign all necessary forms in order for us  to release information regarding their care.   Consent: Patient/Guardian gives verbal consent for treatment and assignment of benefits for services provided during this visit. Patient/Guardian expressed understanding and agreed to proceed.   Elgie JINNY Crest, LCSW 04/05/2024

## 2024-04-08 ENCOUNTER — Inpatient Hospital Stay

## 2024-04-08 VITALS — BP 134/93 | HR 81 | Temp 97.2°F | Resp 18

## 2024-04-08 DIAGNOSIS — D509 Iron deficiency anemia, unspecified: Secondary | ICD-10-CM

## 2024-04-08 MED ORDER — IRON SUCROSE 20 MG/ML IV SOLN
200.0000 mg | Freq: Once | INTRAVENOUS | Status: AC
  Administered 2024-04-08: 200 mg via INTRAVENOUS
  Filled 2024-04-08: qty 10

## 2024-04-10 ENCOUNTER — Inpatient Hospital Stay

## 2024-04-10 VITALS — BP 125/81 | HR 88 | Temp 98.7°F | Resp 16

## 2024-04-10 DIAGNOSIS — D509 Iron deficiency anemia, unspecified: Secondary | ICD-10-CM | POA: Diagnosis not present

## 2024-04-10 MED ORDER — IRON SUCROSE 20 MG/ML IV SOLN
200.0000 mg | Freq: Once | INTRAVENOUS | Status: AC
  Administered 2024-04-10: 200 mg via INTRAVENOUS
  Filled 2024-04-10: qty 10

## 2024-04-10 NOTE — Patient Instructions (Signed)

## 2024-04-12 ENCOUNTER — Inpatient Hospital Stay

## 2024-04-12 VITALS — BP 129/84 | HR 80 | Temp 96.8°F | Resp 18

## 2024-04-12 DIAGNOSIS — D509 Iron deficiency anemia, unspecified: Secondary | ICD-10-CM | POA: Diagnosis not present

## 2024-04-12 MED ORDER — SODIUM CHLORIDE 0.9% FLUSH
10.0000 mL | Freq: Once | INTRAVENOUS | Status: AC | PRN
  Administered 2024-04-12: 10 mL
  Filled 2024-04-12: qty 10

## 2024-04-12 MED ORDER — IRON SUCROSE 20 MG/ML IV SOLN
200.0000 mg | Freq: Once | INTRAVENOUS | Status: AC
  Administered 2024-04-12: 200 mg via INTRAVENOUS
  Filled 2024-04-12: qty 10

## 2024-04-15 ENCOUNTER — Encounter: Payer: Self-pay | Admitting: Oncology

## 2024-04-15 ENCOUNTER — Encounter: Payer: Self-pay | Admitting: Family

## 2024-04-15 ENCOUNTER — Ambulatory Visit: Admitting: Family

## 2024-04-15 ENCOUNTER — Ambulatory Visit
Admission: RE | Admit: 2024-04-15 | Discharge: 2024-04-15 | Disposition: A | Discharge status: Home / Self Care | Attending: Family | Admitting: Family

## 2024-04-15 ENCOUNTER — Ambulatory Visit
Admission: RE | Admit: 2024-04-15 | Discharge: 2024-04-15 | Disposition: A | Discharge status: Home / Self Care | Source: Ambulatory Visit | Attending: Family | Admitting: Family

## 2024-04-15 VITALS — BP 112/72 | HR 94 | Ht 68.0 in | Wt 226.4 lb

## 2024-04-15 DIAGNOSIS — M545 Low back pain, unspecified: Secondary | ICD-10-CM

## 2024-04-15 DIAGNOSIS — F520 Hypoactive sexual desire disorder: Secondary | ICD-10-CM

## 2024-04-15 DIAGNOSIS — I10 Essential (primary) hypertension: Secondary | ICD-10-CM

## 2024-04-15 DIAGNOSIS — E1165 Type 2 diabetes mellitus with hyperglycemia: Secondary | ICD-10-CM

## 2024-04-15 LAB — POCT URINALYSIS DIPSTICK
Bilirubin, UA: NEGATIVE
Blood, UA: NEGATIVE
Glucose, UA: NEGATIVE
Ketones, UA: NEGATIVE
Nitrite, UA: NEGATIVE
Protein, UA: POSITIVE — AB
Spec Grav, UA: 1.02 (ref 1.010–1.025)
Urobilinogen, UA: 1 U/dL
pH, UA: 7.5 (ref 5.0–8.0)

## 2024-04-15 NOTE — Assessment & Plan Note (Signed)
 Blood pressure well controlled with current medications.  Continue current therapy.  Will reassess at follow up.

## 2024-04-15 NOTE — Patient Instructions (Signed)
 Referral Orders         Ambulatory referral to Chiropractic     [facility] Phone: [phone]  Dr. Jodie Salinas [facility]

## 2024-04-15 NOTE — Progress Notes (Signed)
 Acute Office Visit  Subjective:     Patient ID: Penny Gonzalez, female    DOB: 02-13-1996, 28 y.o.   MRN: [id number]  Patient is in today for  Chief Complaint  Patient presents with   Back Pain    Reports feeling better following sale of house and apartment approval, moving on 04/24/2024. Still awaiting short-term disability extension until 05/06/2024; unable to return to work tomorrow as scheduled.  Back pain for a couple of weeks, worsening. Describes it as a bulge. Taking ibuprofen  800mg  without relief. Considering hospital visit for X-ray.  Reports no sexual urges for 5 months, despite dreaming about it. Notes last week during ovulation, experienced no increased urges or hot flashes.  Father recently diagnosed with amyloidosis. His doctor recommended genetic testing for close relatives as it is genetic. Paternal aunt and her daughter also have amyloidosis.  Back Pain This is a new problem. The current episode started 1 to 4 weeks ago. The problem occurs constantly. The problem has been gradually worsening since onset. The pain is present in the lumbar spine. The quality of the pain is described as stabbing. The pain does not radiate. The pain is severe. The pain is The same all the time. The symptoms are aggravated by bending, sitting and position. Stiffness is present All day. Risk factors include obesity. She has tried analgesics, bed rest, heat, ice and NSAIDs for the symptoms.     Review of Systems  Constitutional:  Positive for malaise/fatigue.  Musculoskeletal:  Positive for back pain and joint pain.  All other systems reviewed and are negative.       Objective:    BP 112/72   Pulse 94   Ht 5' 8 (1.727 m)   Wt 226 lb 6.4 oz (102.7 kg)   LMP 03/31/2024 (Approximate)   SpO2 99%   BMI 34.42 kg/m   Physical Exam Vitals and nursing note reviewed.  Constitutional:      Appearance: Normal appearance. She is obese.  HENT:     Head: Normocephalic and  atraumatic.     Right Ear: External ear normal.     Left Ear: External ear normal.     Nose: Nose normal.  Eyes:     Extraocular Movements: Extraocular movements intact.     Conjunctiva/sclera: Conjunctivae normal.     Pupils: Pupils are equal, round, and reactive to light.  Cardiovascular:     Rate and Rhythm: Normal rate and regular rhythm.  Pulmonary:     Effort: Pulmonary effort is normal.  Musculoskeletal:        General: Normal range of motion.  Neurological:     General: No focal deficit present.     Mental Status: She is alert and oriented to person, place, and time. Mental status is at baseline.  Psychiatric:        Mood and Affect: Mood normal.        Behavior: Behavior normal.        Thought Content: Thought content normal.        Judgment: Judgment normal.     No results found for any visits on 04/15/24.  Recent Results (from the past 2160 hours)  NuSwab VG Plus+Mycoplasmas,NAA     Status: Abnormal   Collection Time: 02/28/24  2:06 PM   Specimen: Vaginal Swab   VA  Result Value Ref Range   Atopobium vaginae Low - 0 Score   BVAB 2 Low - 0 Score   Megasphaera 1 Low - 0  Score    Comment: Calculate total score by adding the 3 individual bacterial vaginosis (BV) marker scores together.  Total score is interpreted as follows: Total score 0-1: Indicates the absence of BV. Total score   2: Indeterminate for BV. Additional clinical                  data should be evaluated to establish a                  diagnosis. Total score 3-6: Indicates the presence of BV.    Candida albicans, NAA Negative Negative   Candida glabrata, NAA Negative Negative   Trich vag by NAA Negative Negative   Chlamydia trachomatis, NAA Negative Negative   Neisseria gonorrhoeae, NAA Negative Negative   Mycoplasma genitalium NAA Positive (A) Negative   Mycoplasma hominis NAA Negative Negative   Ureaplasma spp NAA Negative Negative  HSV 1 and 2 Ab, IgG     Status: Abnormal   Collection  Time: 02/28/24  2:06 PM  Result Value Ref Range   HSV 1 Glycoprotein G Ab, IgG Reactive (A) Non Reactive    Comment: **Please note reference interval change** HSV-1 IgG testing performed using the Roche Elecsys HSV-1 IgG assay.    HSV 2 IgG, Type Spec Non Reactive Non Reactive    Comment: **Please note reference interval change** Current guidelines and recommendations do not recommend routine screening for HSV-2 in asymptomatic individuals, including those that are pregnant. The detection of HSV-2 IgG antibodies in a single sample indicates previous exposure to HSV-2 but does not give information as to the site of HSV infection or the timing of exposure. The predictive value of positive and negative results depends on the population's prevalence and the pretest likelihood of HSV-2. HSV-2 IgG testing performed using the Roche Elecsys HSV-2 IgG assay.   RPR w/reflex to TrepSure     Status: None   Collection Time: 02/28/24  2:06 PM  Result Value Ref Range   RPR Non Reactive Non Reactive  Acute Viral Hepatitis (HAV, HBV, HCV)     Status: None   Collection Time: 02/28/24  2:06 PM  Result Value Ref Range   Hep A IgM Negative Negative    Comment: A negative anti-HAV IgM result suggests no recent or current HAV infection.    Hepatitis B Surface Ag Negative Negative   Hep B C IgM Negative Negative   HCV Ab Non Reactive Non Reactive  HIV Antibody (routine testing w rflx)     Status: None   Collection Time: 02/28/24  2:06 PM  Result Value Ref Range   HIV Screen 4th Generation wRfx Non Reactive Non Reactive    Comment: HIV-1/HIV-2 antibodies and HIV-1 p24 antigen were NOT detected. There is no laboratory evidence of HIV infection. HIV Negative   Lipid panel     Status: None   Collection Time: 02/28/24  2:06 PM  Result Value Ref Range   Cholesterol, Total 154 100 - 199 mg/dL   Triglycerides 851 0 - 149 mg/dL   HDL 55 >60 mg/dL   VLDL Cholesterol Cal 25 5 - 40 mg/dL   LDL Chol Calc  (NIH) 74 0 - 99 mg/dL   Chol/HDL Ratio 2.8 0.0 - 4.4 ratio    Comment:                                   T. Chol/HDL Ratio  Men  Women                               1/2 Avg.Risk  3.4    3.3                                   Avg.Risk  5.0    4.4                                2X Avg.Risk  9.6    7.1                                3X Avg.Risk 23.4   11.0   VITAMIN D  25 Hydroxy (Vit-D Deficiency, Fractures)     Status: Abnormal   Collection Time: 02/28/24  2:06 PM  Result Value Ref Range   Vit D, 25-Hydroxy 22.4 (L) 30.0 - 100.0 ng/mL    Comment: Vitamin D  deficiency has been defined by the Institute of Medicine and an Endocrine Society practice guideline as a level of serum 25-OH vitamin D  less than 20 ng/mL (1,2). The Endocrine Society went on to further define vitamin D  insufficiency as a level between 21 and 29 ng/mL (2). 1. IOM (Institute of Medicine). 2010. Dietary reference    intakes for calcium  and D. [place]: The    [employer]. 2. Holick MF, Binkley [place], Bischoff-Ferrari HA, et al.    Evaluation, treatment, and prevention of vitamin D     deficiency: an Endocrine Society clinical practice    guideline. JCEM. 2011 Jul; 96(7):1911-30.   CMP14+EGFR     Status: Abnormal   Collection Time: 02/28/24  2:06 PM  Result Value Ref Range   Glucose 142 (H) 70 - 99 mg/dL   BUN 7 6 - 20 mg/dL   Creatinine, Ser 9.37 0.57 - 1.00 mg/dL   eGFR 876 >40 fO/fpw/8.26   BUN/Creatinine Ratio 11 9 - 23   Sodium 139 134 - 144 mmol/L   Potassium 3.8 3.5 - 5.2 mmol/L   Chloride 104 96 - 106 mmol/L   CO2 21 20 - 29 mmol/L   Calcium  8.9 8.7 - 10.2 mg/dL   Total Protein 7.1 6.0 - 8.5 g/dL   Albumin 4.5 4.0 - 5.0 g/dL   Globulin, Total 2.6 1.5 - 4.5 g/dL   Bilirubin Total 0.3 0.0 - 1.2 mg/dL   Alkaline Phosphatase 60 41 - 116 IU/L    Comment:               **Please note reference interval change**   AST 18 0 - 40 IU/L   ALT 13 0 - 32  IU/L  TSH     Status: None   Collection Time: 02/28/24  2:06 PM  Result Value Ref Range   TSH 0.524 0.450 - 4.500 uIU/mL  Hemoglobin A1c     Status: Abnormal   Collection Time: 02/28/24  2:06 PM  Result Value Ref Range   Hgb A1c MFr Bld 6.3 (H) 4.8 - 5.6 %    Comment:          Prediabetes: 5.7 - 6.4          Diabetes: >6.4          Glycemic control  for adults with diabetes: <7.0    Est. average glucose Bld gHb Est-mCnc 134 mg/dL  Vitamin B12     Status: None   Collection Time: 02/28/24  2:06 PM  Result Value Ref Range   Vitamin B-12 597 232 - 1,245 pg/mL  CBC with Diff     Status: Abnormal   Collection Time: 02/28/24  2:06 PM  Result Value Ref Range   WBC 6.3 3.4 - 10.8 x10E3/uL   RBC 4.37 3.77 - 5.28 x10E6/uL   Hemoglobin 11.5 11.1 - 15.9 g/dL   Hematocrit 62.4 65.9 - 46.6 %   MCV 86 79 - 97 fL   MCH 26.3 (L) 26.6 - 33.0 pg   MCHC 30.7 (L) 31.5 - 35.7 g/dL   RDW 85.0 88.2 - 84.5 %   Platelets 293 150 - 450 x10E3/uL   Neutrophils 53 Not Estab. %   Lymphs 40 Not Estab. %   Monocytes 6 Not Estab. %   Eos 1 Not Estab. %   Basos 0 Not Estab. %   Neutrophils Absolute 3.3 1.4 - 7.0 x10E3/uL   Lymphocytes Absolute 2.5 0.7 - 3.1 x10E3/uL   Monocytes Absolute 0.4 0.1 - 0.9 x10E3/uL   EOS (ABSOLUTE) 0.1 0.0 - 0.4 x10E3/uL   Basophils Absolute 0.0 0.0 - 0.2 x10E3/uL   Immature Granulocytes 0 Not Estab. %   Immature Grans (Abs) 0.0 0.0 - 0.1 x10E3/uL  Iron, TIBC and Ferritin Panel     Status: Abnormal   Collection Time: 02/28/24  2:06 PM  Result Value Ref Range   Total Iron Binding Capacity 431 250 - 450 ug/dL   UIBC 600 868 - 574 ug/dL   Iron 32 27 - 840 ug/dL   Iron Saturation 7 (LL) 15 - 55 %   Ferritin 9 (L) 15 - 150 ng/mL  Interpretation:     Status: None   Collection Time: 02/28/24  2:06 PM  Result Value Ref Range   HCV Interp 1: Comment     Comment: Not infected with HCV unless early or acute infection is suspected (which may be delayed in an  immunocompromised individual), or other evidence exists to indicate HCV infection.   Treponemal Antibodies, TPPA     Status: None   Collection Time: 02/28/24  2:06 PM  Result Value Ref Range   Treponemal Antibodies, TPPA Non Reactive Non Reactive   Interpretation: Comment     Comment: Syphilis: Treponemal Antibodies with Reflex to RPR and RPR           Titer, Reverse Screening and Diagnosis Algorithm ------------------------------------------------------------ Treponemal              Treponemal     Ab       RPR, Qn     Ab, TPPA    Final Interpretation ----------   --------   ----------   ----------------------- Non          N/A        N/A          No laboratory evidence Reactive                             of syphilis. Retest in                                      2-4 weeks if recent  exposure is suspected. ----------   --------   ----------   ----------------------- Reactive     Non        Non          Treponemal antibodies              Reactive   Reactive     not confirmed.                                      Inconclusive for                                      syphilis; potential                                      early syphilis,                                      possible false                                       positive. Retest in 2-4                                      weeks if recent                                      exposure is suspected. ----------   --------   ----------   ----------------------- Reactive     Non        Reactive     Treponemal antibodies              Reactive                detected. Consistent                                      with past or current                                      (potential early)                                      syphilis. ----------   --------   ----------   ----------------------- Reactive     >/=1:1     N/A          Treponemal and                                       nontreponemal  antibodies detected.                                      Consistent with current                                      or past syphilis. This test is intended ONLY for specimens that have tested positive (reactive) or equivocal for Treponema pallidum antibodies prior to submission for testing.  For the full CDC-recommended syphilis screening and diagnosis algorithm, [facility] offers test code 012005 RPR, Rfx Qn RPR/Confirm TP or 917654 T pallidum Screening [facility]ascade.   POC CREATINE & ALBUMIN,URINE     Status: None   Collection Time: 03/25/24  3:33 PM  Result Value Ref Range   Microalbumin Ur, POC 30 mg/L   Creatinine, POC 300 mg/dL   Albumin/Creatinine Ratio, Urine, POC <30   POCT Urinalysis Dipstick (18997)     Status: Abnormal   Collection Time: 03/25/24  3:34 PM  Result Value Ref Range   Color, UA Yellow    Clarity, UA Clear    Glucose, UA Negative Negative   Bilirubin, UA Negative    Ketones, UA Negative    Spec Grav, UA 1.025 1.010 - 1.025   Blood, UA Negative    pH, UA 7.0 5.0 - 8.0   Protein, UA Negative Negative   Urobilinogen, UA 0.2 0.2 or 1.0 E.U./dL   Nitrite, UA Negative    Leukocytes, UA Trace (A) Negative   Appearance Clear    Odor Yes   Folic Acid     Status: None   Collection Time: 04/02/24 12:03 PM  Result Value Ref Range   Folate 17.1 >5.9 ng/mL    Comment: Performed at [facility] [address] [place], [place] [address]  Vitamin B12     Status: None   Collection Time: 04/02/24 12:03 PM  Result Value Ref Range   Vitamin B-12 359 180 - 914 pg/mL    Comment: (NOTE) This assay is not validated for testing neonatal or myeloproliferative syndrome specimens for Vitamin B12 levels. Performed at [facility], [address] [place], [place] [address]   Iron and TIBC     Status: Abnormal   Collection Time: 04/02/24 12:03 PM  Result Value Ref Range   Iron 40 28 - 170 ug/dL   TIBC 495 (H)  749 - 549 ug/dL   Saturation Ratios 8 (L) 10.4 - 31.8 %   UIBC 464 ug/dL    Comment: Performed at [facility], [address] [place], [place] [address]  Ferritin     Status: Abnormal   Collection Time: 04/02/24 12:03 PM  Result Value Ref Range   Ferritin 5 (L) 11 - 307 ng/mL    Comment: Performed at [facility], [address] [place], [place] [address]  CBC (Cancer Center Only)     Status: Abnormal   Collection Time: 04/02/24 12:03 PM  Result Value Ref Range   WBC Count 5.5 4.0 - 10.5 K/uL   RBC 4.04 3.87 - 5.11 MIL/uL   Hemoglobin 10.5 (L) 12.0 - 15.0 g/dL   HCT 67.0 (L) 63.9 - 53.9 %   MCV 81.4 80.0 - 100.0 fL   MCH 26.0 26.0 - 34.0 pg   MCHC 31.9 30.0 - 36.0 g/dL  RDW 15.1 11.5 - 15.5 %   Platelet Count 289 150 - 400 K/uL   nRBC 0.0 0.0 - 0.2 %    Comment: Performed at [facility], [address] [place], [place] [address]    Allergies as of 04/15/2024       Reactions   Latex Rash   Pruritic, urticaria        Medication List        Accurate as of April 15, 2024 11:27 AM. If you have any questions, ask your nurse or doctor.          Rybelsus  14 MG Tabs Generic drug: Semaglutide  Take 1 tablet (14 mg total) by mouth daily.   Vitamin D  (Ergocalciferol ) 1.25 MG (50000 UNIT) Caps capsule Commonly known as: DRISDOL  Take 1 capsule (50,000 Units total) by mouth every 7 (seven) days.           Assessment & Plan Acute right-sided low back pain without sciatica Will get XR today.  Also obtained UA in office - will send to lab.  Setting patient up for referral to Chiropractor.  Will defer to them for further treatment changes.  Reassess at follow up.  Essential hypertension, benign Blood pressure well controlled with current medications.  Continue current therapy.  Will reassess at follow up.   Decreased sexual desire Will consider checking hormone levels if this does not improve. Suspect might be related to her mental  health and stress levels.    Return as previously scheduled.  Total time spent: 20 minutes  ALAN CHRISTELLA ARRANT, FNP  04/15/2024   This document may have been prepared by [facility] Voice Recognition software and as such may include unintentional dictation errors.

## 2024-04-18 ENCOUNTER — Encounter: Payer: Self-pay | Admitting: Family

## 2024-04-19 ENCOUNTER — Encounter (HOSPITAL_COMMUNITY): Payer: Self-pay | Admitting: Clinical

## 2024-04-19 ENCOUNTER — Ambulatory Visit (HOSPITAL_COMMUNITY): Admitting: Clinical

## 2024-04-19 DIAGNOSIS — F419 Anxiety disorder, unspecified: Secondary | ICD-10-CM

## 2024-04-19 DIAGNOSIS — F331 Major depressive disorder, recurrent, moderate: Secondary | ICD-10-CM

## 2024-04-19 NOTE — Progress Notes (Signed)
 THERAPIST PROGRESS NOTE  Session Time: 10:00am-11:00am  Session #21  Virtual Visit via Video Note  I connected with Penny Gonzalez on 04/19/24 at 10:00 AM EST by a video enabled telemedicine application and verified that I am speaking with the correct person using two identifiers.  Location: Patient: home Provider: home office   I discussed the limitations of evaluation and management by telemedicine and the availability of in person appointments. The patient expressed understanding and agreed to proceed.  I discussed the assessment and treatment plan with the patient. The patient was provided an opportunity to ask questions and all were answered. The patient agreed with the plan and demonstrated an understanding of the instructions.   The patient was advised to call back or seek an in-person evaluation if the symptoms worsen or if the condition fails to improve as anticipated.  I provided 60 minutes of non-face-to-face time during this encounter  Elgie JINNY Crest, LCSW   Participation Level: Active  Behavioral Response: Casual Alert Negative   Type of Therapy: Individual Therapy  Treatment Goals addressed:  STG: Identify and decrease cognitive distortions contributing negatively to mood and behavior by identifying 5-7 cognitive distortions that are present; learn how to come up with replacement thoughts that are more balanced, realistic, and helpful.    LTG: Explore personal core beliefs, rules and assumptions, and cognitive distortions through therapist using Cognitive Behavioral Therapy; learn about Behavioral Activation and Acting As If.   STG: Learn and practice communication techniques such as I statements, open-ended questions, reflective listening, assertiveness, fair fighting rules, active listening, initiating conversations, and more as necessary and taught in session.    STG: Learn about detachment from loved one's negative behaviors to be healthier  emotionally, physically, and spiritually herself and work to detach from loved ones' poor choices.   LTG: Score less than 9 on the PHQ-9 and less than 5 on the GAD-7 as evidenced by intermittent administration of the questionnaires to determine progress in managing depression and anxiety.  STG:  Process life events to the extent needed so that will be able to move forward with various areas of life in a better frame of mind per self-report of improved satisfaction with life 5 out of 7 days over the next 6 months.  LTG: Learn a variety of breathing techniques and grounding strategies, practice in session then report independent application out of session 2-4 times per month or more often, if needed.  STG: Learn about 3 boundary styles and 6-8 types, how to implement them, and how to enforce them, then report feeling more empowered and content with being able to maintain more helpful, appropriate boundaries in the future for a more balanced result.  LTG: Improve self-esteem by engaging in daily affirmations, developing new skills, gratitude journaling, use of SMART goals, increased assertiveness, challenging negative beliefs, and focusing on what patient can control  LTG: Work to learn 10+ coping skills from models like CBT, Stages of Change, DBT, shame resilience theory, ACT, SFBT, MI, trauma-informed therapy and others to be able to manage mental health symptoms, AEB practicing out of session and reporting back.  ProgressTowards Goals: Progressing  Interventions: CBT and Supportive  Summary: Penny Gonzalez is a 28 y.o. female who presents with post-partum depression and anxiety for therapy.  She presented oriented x5 and stated she was feeling I got my apartment and the house sold so I am excited things are coming together.  CSW evaluated patient's medication compliance, use of coping tools, and self-care,  as applicable.  She provided an update on various aspects of her life that are normally  discussed in therapy, including still having difficulty getting medical records from [facility] for her short-term disability and FMLA, her house selling, getting approved for her apartment, and paying off 90% of her debt.  She showed CSW around her house and is visibly excited.  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  When it turned out in fact that she could see that possibly the manager was trying to be helpful and she took it the opposite way, CSW was sure to inform her was not trying to sway her to stay or to leave, just to make decisions based on the truth and what she really wants as a result of that truth.  For instance, she felt that the manager triggered me on purpose right when I was going out on leave, but then concluded that she may have been trying to help me so I wouldn't get fired.  She asked CSW to write a letter to [employer] about what she is being treated for and CSW agreed.  Additional appointments were scheduled through February (1/13, 1/27, and 2/10 at 8am).  Suicidal/Homicidal: No without intent/plan  Therapist Response: Patient is progressing AEB engaging in scheduled therapy session.  Throughout the session, CSW gave patient the opportunity to explore thoughts and feelings associated with current life situations and past/present stressors.   CSW challenged patient gently and appropriately to consider different ways of looking at reported issues. CSW encouraged patient's expression of feelings and validated these using empathy, active listening, open body language, and unconditional positive regard.         Plan/Recommendations:  Return to therapy in 4 weeks to next scheduled appointment on 12/5, reflect on what  was discussed in session, engage in self care behaviors as explored in session, do homework as assigned (use CBT model as she thinks about future jobs), and return to next session prepared to talk about experience with new coping methods.   Diagnosis:  Major depressive disorder, recurrent episode, moderate degree (HCC)  Anxiety disorder, unspecified type  Collaboration of Care: Primary Care Provider AEB - can see that patient is in therapy although may not be able to read notes  Patient/Guardian was advised Release of Information must be obtained prior to any record release in order to collaborate their care with an outside provider. Patient/Guardian was advised if they have not already done so to contact the registration department to sign all necessary forms in order for us  to release information regarding their care.   Consent: Patient/Guardian gives verbal consent for treatment and assignment of benefits for services provided during this visit. Patient/Guardian expressed understanding and agreed to proceed.   Elgie JINNY Crest, LCSW 04/19/2024

## 2024-04-25 ENCOUNTER — Encounter: Payer: Self-pay | Admitting: Family

## 2024-05-17 ENCOUNTER — Encounter (HOSPITAL_COMMUNITY): Payer: Self-pay | Admitting: Clinical

## 2024-05-17 ENCOUNTER — Ambulatory Visit: Payer: Self-pay

## 2024-05-17 ENCOUNTER — Ambulatory Visit (INDEPENDENT_AMBULATORY_CARE_PROVIDER_SITE_OTHER): Admitting: Clinical

## 2024-05-17 DIAGNOSIS — F419 Anxiety disorder, unspecified: Secondary | ICD-10-CM | POA: Diagnosis not present

## 2024-05-17 DIAGNOSIS — F331 Major depressive disorder, recurrent, moderate: Secondary | ICD-10-CM | POA: Diagnosis not present

## 2024-05-17 NOTE — Progress Notes (Signed)
 THERAPIST PROGRESS NOTE  Session Time: 10:05am-11:03am   Session #22  Virtual Visit via Video Note  I connected with Penny Gonzalez on 05/17/24 at 10:00 AM EST by a video enabled telemedicine application and verified that I am speaking with the correct person using two identifiers.  Location: Patient: home Provider: home office   I discussed the limitations of evaluation and management by telemedicine and the availability of in person appointments. The patient expressed understanding and agreed to proceed.  I discussed the assessment and treatment plan with the patient. The patient was provided an opportunity to ask questions and all were answered. The patient agreed with the plan and demonstrated an understanding of the instructions.   The patient was advised to call back or seek an in-person evaluation if the symptoms worsen or if the condition fails to improve as anticipated.  I provided 58 minutes of non-face-to-face time during this encounter  Elgie JINNY Crest, LCSW   Participation Level: Active  Behavioral Response: Casual Alert Negative and Irritable   Type of Therapy: Individual Therapy  Treatment Goals addressed:  STG: Identify and decrease cognitive distortions contributing negatively to mood and behavior by identifying 5-7 cognitive distortions that are present; learn how to come up with replacement thoughts that are more balanced, realistic, and helpful.    LTG: Explore personal core beliefs, rules and assumptions, and cognitive distortions through therapist using Cognitive Behavioral Therapy; learn about Behavioral Activation and Acting As If.   STG: Learn and practice communication techniques such as I statements, open-ended questions, reflective listening, assertiveness, fair fighting rules, active listening, initiating conversations, and more as necessary and taught in session.    STG: Learn about detachment from loved one's negative behaviors to be  healthier emotionally, physically, and spiritually herself and work to detach from loved ones' poor choices.   LTG: Score less than 9 on the PHQ-9 and less than 5 on the GAD-7 as evidenced by intermittent administration of the questionnaires to determine progress in managing depression and anxiety.  STG:  Process life events to the extent needed so that will be able to move forward with various areas of life in a better frame of mind per self-report of improved satisfaction with life 5 out of 7 days over the next 6 months.  LTG: Learn a variety of breathing techniques and grounding strategies, practice in session then report independent application out of session 2-4 times per month or more often, if needed.  STG: Learn about 3 boundary styles and 6-8 types, how to implement them, and how to enforce them, then report feeling more empowered and content with being able to maintain more helpful, appropriate boundaries in the future for a more balanced result.  LTG: Improve self-esteem by engaging in daily affirmations, developing new skills, gratitude journaling, use of SMART goals, increased assertiveness, challenging negative beliefs, and focusing on what patient can control  LTG: Work to learn 10+ coping skills from models like CBT, Stages of Change, DBT, shame resilience theory, ACT, SFBT, MI, trauma-informed therapy and others to be able to manage mental health symptoms, AEB practicing out of session and reporting back.  ProgressTowards Goals: Progressing  Interventions: Supportive and Other: child development, serenity prayer   Summary: Penny Gonzalez is a 28 y.o. female who presents with post-partum depression and anxiety for therapy.  She presented oriented x5 and stated she was feeling going through another whirlwind.  CSW evaluated patient's medication compliance, use of coping tools, and self-care, as applicable.  She  provided an update on various aspects of her life that are normally  discussed in therapy, including an issue that has recurred with her 9yo daughter's father.  They had a big argument over him not seeing their daughter when he was in state/in town and he gave messages to daughter to give her that patient felt were very adult-like and disrespectful.  Daughter is also not doing all she expects her to do, does not follow directions sufficiently, which is quite annoying to patient.  Because of disobedience when allowed to go outside, patient has removed the privilege of playing outside for now, will reconsider in the new year.  A friend has mentioned to her that the child could have attachment issues because father is not typically present in her life, so she would like her daughter to have therapy.  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  She also processed how the job is going and she continues to not be comfortable with her knowledge level, expressed that her training was inadequate compared to the expectations the company places on her.  She is still looking for another position.  She continues to rely on her faith heavily, was encouraged to think about the serenity prayer in relation to not only her job situation but also her daughter, with CSW reminding her that no matter what we say to our children, they learn best through experience and, furthermore, we are unable to change or control them.  CSW told her about the current stage of development, per [place], that her daughter is in, which explains both why she is trying to be independent and why her friends are becoming so much more important than her family.  CSW encouraged patient to look up more child development-related information to feel more prepared to handle her daughter in a loving fashion.  Suicidal/Homicidal: No without intent/plan  Therapist Response: Patient is progressing AEB engaging in scheduled therapy session.  Throughout the  session, CSW gave patient the opportunity to explore thoughts and feelings associated with current life situations and past/present stressors.   CSW challenged patient gently and appropriately to consider different ways of looking at reported issues. CSW encouraged patient's expression of feelings and validated these using empathy, active listening, open body language, and unconditional positive regard.         Plan/Recommendations:  Return to therapy in 4 weeks to next scheduled appointment on 1/13, reflect on what was discussed in session, engage in self care behaviors as explored in session, do homework as assigned (use CBT model as she thinks about future jobs, look up child development material about 9-10yo), and return to next session prepared to talk about experience with new coping methods.  Diagnosis:  Major depressive disorder, recurrent episode, moderate degree (HCC)  Anxiety disorder, unspecified type  Collaboration of Care: Primary Care Provider AEB - can see that patient is in therapy although may not be able to read notes  Patient/Guardian was advised Release of Information must be obtained prior to any record release in order to collaborate their care with an outside provider. Patient/Guardian was advised if they have not already done so to contact the registration department to sign all necessary forms in order for us  to release information regarding their care.   Consent: Patient/Guardian gives verbal consent for treatment and assignment of benefits for services provided during this visit. Patient/Guardian expressed understanding and agreed to proceed.   Sammantha Mehlhaff  JINNY Crest, LCSW 05/17/2024

## 2024-05-27 ENCOUNTER — Ambulatory Visit: Admitting: Family

## 2024-05-27 ENCOUNTER — Other Ambulatory Visit: Payer: Self-pay | Admitting: Family

## 2024-05-27 ENCOUNTER — Other Ambulatory Visit

## 2024-05-27 DIAGNOSIS — R8290 Unspecified abnormal findings in urine: Secondary | ICD-10-CM

## 2024-05-27 DIAGNOSIS — E1165 Type 2 diabetes mellitus with hyperglycemia: Secondary | ICD-10-CM

## 2024-05-27 DIAGNOSIS — E611 Iron deficiency: Secondary | ICD-10-CM

## 2024-05-27 DIAGNOSIS — I10 Essential (primary) hypertension: Secondary | ICD-10-CM

## 2024-05-27 DIAGNOSIS — N760 Acute vaginitis: Secondary | ICD-10-CM

## 2024-05-27 DIAGNOSIS — E782 Mixed hyperlipidemia: Secondary | ICD-10-CM

## 2024-05-27 DIAGNOSIS — E559 Vitamin D deficiency, unspecified: Secondary | ICD-10-CM

## 2024-05-27 DIAGNOSIS — R5383 Other fatigue: Secondary | ICD-10-CM

## 2024-05-27 DIAGNOSIS — Z6831 Body mass index (BMI) 31.0-31.9, adult: Secondary | ICD-10-CM

## 2024-05-27 DIAGNOSIS — Z202 Contact with and (suspected) exposure to infections with a predominantly sexual mode of transmission: Secondary | ICD-10-CM

## 2024-05-27 DIAGNOSIS — E538 Deficiency of other specified B group vitamins: Secondary | ICD-10-CM

## 2024-05-27 LAB — POCT URINALYSIS DIPSTICK
Bilirubin, UA: NEGATIVE
Blood, UA: POSITIVE
Glucose, UA: NEGATIVE
Ketones, UA: NEGATIVE
Nitrite, UA: NEGATIVE
Protein, UA: POSITIVE — AB
Spec Grav, UA: 1.025 (ref 1.010–1.025)
Urobilinogen, UA: 0.2 U/dL
pH, UA: 7 (ref 5.0–8.0)

## 2024-05-28 LAB — CMP14+EGFR
ALT: 11 IU/L (ref 0–32)
AST: 14 IU/L (ref 0–40)
Albumin: 4.7 g/dL (ref 4.0–5.0)
Alkaline Phosphatase: 61 IU/L (ref 41–116)
BUN/Creatinine Ratio: 12 (ref 9–23)
BUN: 8 mg/dL (ref 6–20)
Bilirubin Total: 0.3 mg/dL (ref 0.0–1.2)
CO2: 25 mmol/L (ref 20–29)
Calcium: 9.5 mg/dL (ref 8.7–10.2)
Chloride: 103 mmol/L (ref 96–106)
Creatinine, Ser: 0.69 mg/dL (ref 0.57–1.00)
Globulin, Total: 2.5 g/dL (ref 1.5–4.5)
Glucose: 95 mg/dL (ref 70–99)
Potassium: 4.3 mmol/L (ref 3.5–5.2)
Sodium: 140 mmol/L (ref 134–144)
Total Protein: 7.2 g/dL (ref 6.0–8.5)
eGFR: 120 mL/min/1.73 (ref >59)

## 2024-05-28 LAB — LIPID PANEL
Chol/HDL Ratio: 2.4 ratio (ref 0.0–4.4)
Cholesterol, Total: 156 mg/dL (ref 100–199)
HDL: 64 mg/dL (ref >39)
LDL Chol Calc (NIH): 68 mg/dL (ref 0–99)
Triglycerides: 142 mg/dL (ref 0–149)
VLDL Cholesterol Cal: 24 mg/dL (ref 5–40)

## 2024-05-28 LAB — IRON,TIBC AND FERRITIN PANEL
Ferritin: 73 ng/mL (ref 15–150)
Iron Saturation: 14 % — ABNORMAL LOW (ref 15–55)
Iron: 52 ug/dL (ref 27–159)
Total Iron Binding Capacity: 375 ug/dL (ref 250–450)
UIBC: 323 ug/dL (ref 131–425)

## 2024-05-28 LAB — URINALYSIS, ROUTINE W REFLEX MICROSCOPIC
Bilirubin, UA: NEGATIVE
Glucose, UA: NEGATIVE
Ketones, UA: NEGATIVE
Nitrite, UA: NEGATIVE
Specific Gravity, UA: 1.028 (ref 1.005–1.030)
Urobilinogen, Ur: 0.2 mg/dL (ref 0.2–1.0)
pH, UA: 7 (ref 5.0–7.5)

## 2024-05-28 LAB — CBC WITH DIFFERENTIAL/PLATELET
Basophils Absolute: 0 x10E3/uL (ref 0.0–0.2)
Basos: 0 %
EOS (ABSOLUTE): 0.1 x10E3/uL (ref 0.0–0.4)
Eos: 2 %
Hematocrit: 38.4 % (ref 34.0–46.6)
Hemoglobin: 12.3 g/dL (ref 11.1–15.9)
Immature Grans (Abs): 0 x10E3/uL (ref 0.0–0.1)
Immature Granulocytes: 0 %
Lymphocytes Absolute: 2.9 x10E3/uL (ref 0.7–3.1)
Lymphs: 44 %
MCH: 27.3 pg (ref 26.6–33.0)
MCHC: 32 g/dL (ref 31.5–35.7)
MCV: 85 fL (ref 79–97)
Monocytes Absolute: 0.3 x10E3/uL (ref 0.1–0.9)
Monocytes: 5 %
Neutrophils Absolute: 3.3 x10E3/uL (ref 1.4–7.0)
Neutrophils: 49 %
Platelets: 314 x10E3/uL (ref 150–450)
RBC: 4.51 x10E6/uL (ref 3.77–5.28)
RDW: 15.8 % — ABNORMAL HIGH (ref 11.7–15.4)
WBC: 6.7 x10E3/uL (ref 3.4–10.8)

## 2024-05-28 LAB — HEMOGLOBIN A1C
Est. average glucose Bld gHb Est-mCnc: 114 mg/dL
Hgb A1c MFr Bld: 5.6 % (ref 4.8–5.6)

## 2024-05-28 LAB — TSH: TSH: 0.651 u[IU]/mL (ref 0.450–4.500)

## 2024-05-28 LAB — MICROSCOPIC EXAMINATION
Bacteria, UA: NONE SEEN
Casts: NONE SEEN /LPF

## 2024-05-28 LAB — VITAMIN D 25 HYDROXY (VIT D DEFICIENCY, FRACTURES): Vit D, 25-Hydroxy: 34.8 ng/mL (ref 30.0–100.0)

## 2024-05-28 LAB — VITAMIN B12: Vitamin B-12: 541 pg/mL (ref 232–1245)

## 2024-05-29 LAB — NUSWAB VG+, HSV
Candida albicans, NAA: NEGATIVE
Candida glabrata, NAA: NEGATIVE
Chlamydia trachomatis, NAA: NEGATIVE
HSV 1 NAA: NEGATIVE
HSV 2 NAA: NEGATIVE
Neisseria gonorrhoeae, NAA: NEGATIVE
Trich vag by NAA: NEGATIVE

## 2024-05-29 LAB — URINE CULTURE

## 2024-06-05 ENCOUNTER — Ambulatory Visit: Payer: Self-pay

## 2024-06-19 ENCOUNTER — Emergency Department
Admission: EM | Admit: 2024-06-19 | Discharge: 2024-06-19 | Disposition: A | Discharge status: Home / Self Care | Attending: Emergency Medicine | Admitting: Emergency Medicine

## 2024-06-19 ENCOUNTER — Other Ambulatory Visit: Payer: Self-pay

## 2024-06-19 DIAGNOSIS — E119 Type 2 diabetes mellitus without complications: Secondary | ICD-10-CM | POA: Diagnosis not present

## 2024-06-19 DIAGNOSIS — J101 Influenza due to other identified influenza virus with other respiratory manifestations: Secondary | ICD-10-CM | POA: Insufficient documentation

## 2024-06-19 DIAGNOSIS — R519 Headache, unspecified: Secondary | ICD-10-CM | POA: Diagnosis present

## 2024-06-19 LAB — COMPREHENSIVE METABOLIC PANEL WITH GFR
ALT: 20 U/L (ref 0–44)
AST: 17 U/L (ref 15–41)
Albumin: 4.4 g/dL (ref 3.5–5.0)
Alkaline Phosphatase: 52 U/L (ref 38–126)
Anion gap: 10 (ref 5–15)
BUN: 7 mg/dL (ref 6–20)
CO2: 26 mmol/L (ref 22–32)
Calcium: 9.1 mg/dL (ref 8.9–10.3)
Chloride: 104 mmol/L (ref 98–111)
Creatinine, Ser: 0.58 mg/dL (ref 0.44–1.00)
GFR, Estimated: >60 mL/min
Glucose, Bld: 101 mg/dL — ABNORMAL HIGH (ref 70–99)
Potassium: 4.1 mmol/L (ref 3.5–5.1)
Sodium: 140 mmol/L (ref 135–145)
Total Bilirubin: 0.4 mg/dL (ref 0.0–1.2)
Total Protein: 7.1 g/dL (ref 6.5–8.1)

## 2024-06-19 LAB — CBC
HCT: 36.9 % (ref 36.0–46.0)
Hemoglobin: 11.8 g/dL — ABNORMAL LOW (ref 12.0–15.0)
MCH: 27.2 pg (ref 26.0–34.0)
MCHC: 32 g/dL (ref 30.0–36.0)
MCV: 85 fL (ref 80.0–100.0)
Platelets: 265 K/uL (ref 150–400)
RBC: 4.34 MIL/uL (ref 3.87–5.11)
RDW: 15.8 % — ABNORMAL HIGH (ref 11.5–15.5)
WBC: 5.7 K/uL (ref 4.0–10.5)
nRBC: 0 % (ref 0.0–0.2)

## 2024-06-19 MED ORDER — DEXAMETHASONE SOD PHOSPHATE PF 10 MG/ML IJ SOLN
10.0000 mg | Freq: Once | INTRAMUSCULAR | Status: AC
  Administered 2024-06-19: 10 mg via INTRAMUSCULAR
  Filled 2024-06-19: qty 1

## 2024-06-19 NOTE — ED Provider Notes (Signed)
 "  [facility] Provider Note    Event Date/Time   First MD Initiated Contact with Patient 06/19/24 [phone]     (approximate)  History   Chief Complaint: Headache  HPI  Penny Gonzalez is a 29 y.o. female with a past medical history of anemia, diabetes, presents to the emergency department for continued headaches dizziness fatigue chills.  According to the patient approximately 12 days ago she was diagnosed with influenza.  Patient states she had been feeling somewhat better over the last few days until yesterday then began feeling worse once again.  Patient describes intermittent headaches describes fullness sensation in her ears and feels like her congestion is returning.  Has not measured a temperature recently.  Patient gives a good history.  Appears well, nontoxic  Physical Exam   Triage Vital Signs: ED Triage Vitals  Encounter Vitals Group     BP 06/19/24 0710 (!) 131/90     Girls Systolic BP Percentile --      Girls Diastolic BP Percentile --      Boys Systolic BP Percentile --      Boys Diastolic BP Percentile --      Pulse Rate 06/19/24 0710 93     Resp 06/19/24 0710 16     Temp 06/19/24 0710 98.3 F (36.8 C)     Temp Source 06/19/24 0710 Oral     SpO2 06/19/24 0709 96 %     Weight 06/19/24 0711 226 lb 6.6 oz (102.7 kg)     Height --      Head Circumference --      Peak Flow --      Pain Score 06/19/24 0711 8     Pain Loc --      Pain Education --      Exclude from Growth Chart --     Most recent vital signs: Vitals:   06/19/24 0709 06/19/24 0710  BP:  (!) 131/90  Pulse:  93  Resp:  16  Temp:  98.3 F (36.8 C)  SpO2: 96% 98%    General: Awake, no distress.  No tympanic membrane erythema or bulging. CV:  Good peripheral perfusion.  Regular rate and rhythm  Resp:  Normal effort.  Equal breath sounds bilaterally.  No wheeze rales or rhonchi. Abd:  No distention.   ED Results / Procedures / Treatments   MEDICATIONS ORDERED IN  ED: Medications  dexamethasone  (DECADRON ) injection 10 mg (has no administration in time range)     IMPRESSION / MDM / ASSESSMENT AND PLAN / ED COURSE  I reviewed the triage vital signs and the nursing notes.  Patient's presentation is most consistent with acute illness / injury with system symptoms.  Patient presents to the emergency department for continued headaches congestion ear fullness approximately 12 days after influenza symptoms began.  Patient was diagnosed 12/26 with influenza A.  Overall the patient appears well reassuring physical exam.  Reassuring vital signs.  Discussed with the patient likelihood of continued influenza symptoms, recommended continued supportive care.  Given the patient's head congestion we will also dose a one-time dose of IM Decadron  to help with symptom resolution.  We will check basic labs including a CBC and chemistry as a precaution.  Patient agreeable to plan.  Patient's lab work is reassuring with a normal CBC normal chemistry including normal anion gap normal renal function normal electrolytes.  We will discharge with supportive care.  Patient agreeable to plan of care.  FINAL CLINICAL IMPRESSION(S) /  ED DIAGNOSES   Influenza A   Note:  This document was prepared using Dragon voice recognition software and may include unintentional dictation errors.   Dorothyann Drivers, MD 06/19/24 [phone]  "

## 2024-06-19 NOTE — ED Triage Notes (Signed)
 C/O headache, bilateral ear pain, right worse than left this morning, cough, congestion.  Had flu just after Christmas, son recently had flu.  Patient states she had started to get better and then symptoms worsened again yesterday.  Has taken tylenol  and Ibuprofen  for headache without relief. Last taken last night.  AAOx3. Skin warm and dry. NAD. Ambulates with easy and steady gait.

## 2024-06-19 NOTE — ED Notes (Addendum)
 Pt stated that they had the flu after christmas, took tamiflu  which started making her nauseous towards the end of the course of the medications. Pt states that they feel like their symptoms have reverted back to when they had the flu. Pt stated that they have been forgetting things, having weird dreams which they normally don't have and sweats, and a throbbing H/A that started yesterday and is more prevalent on the front and left side of their head. Pt also reports the room spinning this morning and felt like they were going to pass out.

## 2024-06-25 ENCOUNTER — Ambulatory Visit (INDEPENDENT_AMBULATORY_CARE_PROVIDER_SITE_OTHER): Admitting: Clinical

## 2024-06-25 ENCOUNTER — Encounter (HOSPITAL_COMMUNITY): Payer: Self-pay | Admitting: Clinical

## 2024-06-25 DIAGNOSIS — F411 Generalized anxiety disorder: Secondary | ICD-10-CM | POA: Diagnosis not present

## 2024-06-25 DIAGNOSIS — F331 Major depressive disorder, recurrent, moderate: Secondary | ICD-10-CM | POA: Diagnosis not present

## 2024-06-25 NOTE — Progress Notes (Signed)
 THERAPIST PROGRESS NOTE  Session Time: 8:02am-9:02am   Session #23  Virtual Visit via Video Note  I connected with Penny Gonzalez on 06/25/2024 at  8:00 AM EST by a video enabled telemedicine application and verified that I am speaking with the correct person using two identifiers.  Location: Patient: home Provider: home office   I discussed the limitations of evaluation and management by telemedicine and the availability of in person appointments. The patient expressed understanding and agreed to proceed.  I discussed the assessment and treatment plan with the patient. The patient was provided an opportunity to ask questions and all were answered. The patient agreed with the plan and demonstrated an understanding of the instructions.   The patient was advised to call back or seek an in-person evaluation if the symptoms worsen or if the condition fails to improve as anticipated.  I provided 60 minutes of non-face-to-face time during this encounter  Elgie JINNY Crest, LCSW   Participation Level: Active  Behavioral Response: Casual Alert Anxious, Depressed, and Tearful   Type of Therapy: Individual Therapy  Treatment Goals addressed:  STG: Identify and decrease cognitive distortions contributing negatively to mood and behavior by identifying 5-7 cognitive distortions that are present; learn how to come up with replacement thoughts that are more balanced, realistic, and helpful.    LTG: Explore personal core beliefs, rules and assumptions, and cognitive distortions through therapist using Cognitive Behavioral Therapy; learn about Behavioral Activation and Acting As If.   STG: Learn and practice communication techniques such as I statements, open-ended questions, reflective listening, assertiveness, fair fighting rules, active listening, initiating conversations, and more as necessary and taught in session.    STG: Learn about detachment from loved one's negative behaviors  to be healthier emotionally, physically, and spiritually herself and work to detach from loved ones' poor choices.   LTG: Score less than 9 on the PHQ-9 and less than 5 on the GAD-7 as evidenced by intermittent administration of the questionnaires to determine progress in managing depression and anxiety.  STG:  Process life events to the extent needed so that will be able to move forward with various areas of life in a better frame of mind per self-report of improved satisfaction with life 5 out of 7 days over the next 6 months.  LTG: Learn a variety of breathing techniques and grounding strategies, practice in session then report independent application out of session 2-4 times per month or more often, if needed.  STG: Learn about 3 boundary styles and 6-8 types, how to implement them, and how to enforce them, then report feeling more empowered and content with being able to maintain more helpful, appropriate boundaries in the future for a more balanced result.  LTG: Improve self-esteem by engaging in daily affirmations, developing new skills, gratitude journaling, use of SMART goals, increased assertiveness, challenging negative beliefs, and focusing on what patient can control  LTG: Work to learn 10+ coping skills from models like CBT, Stages of Change, DBT, shame resilience theory, ACT, SFBT, MI, trauma-informed therapy and others to be able to manage mental health symptoms, AEB practicing out of session and reporting back.  ProgressTowards Goals: Progressing  Interventions: Supportive and Other: symptom assessment, arranging referral/doctor appointment, suicide risk assessment   Summary: Penny Gonzalez is a 29 y.o. female who presents with post-partum depression and anxiety for therapy.  Patient presented tearful and visibly depressed, stating, Jesus officially turned into a psych patient. I seriously need to be referred to the psychiatrist.  Ive gone crazy. She reported having the flu with  complications shortly after Christmas, during which both children were also ill. Since that time, she has experienced significant mood instability and frequent crying. Patient endorsed increased anxiety related to her job, with persistent rumination that something is wrong at work. She cited multiple workplace stressors, including cancellation of the pension and a mandatory address by the company president to the entire workforce.  Patient described symptoms consistent with panic episodes, though stated she is unsure what panic attacks feel like. Symptoms include chest pain, severe headaches, clammy hands, jitteriness, inability to calm herself, rapid breathing, and heightened awareness of her heartbeat. She reported needing to leave work and lie down due to symptom severity. Patient also disclosed setting a boundary with her father regarding his manner of speaking to her, identifying it as triggering.  Depressive symptoms have markedly worsened. Patient reported sleeping no more than four hours total per night for an extended period, frequent crying, decreased appetite, and forgetting to eat to the extent that her 64-year-old daughter has reminded her. She described low frustration tolerance, social withdrawal, and difficulty engaging in conversations. CSW assessed for suicidal ideation, plan, and means; patient denied all.  Patient discussed ongoing communication with her babys father but stated she does not want to resume a romantic relationship. She has allowed him to watch the baby a few times to assess reliability and has clearly communicated her needs. She reported he continues to place responsibility on her to ask for help rather than offering it, reinforcing her sense of being alone in parenting. Patient expressed a preference to parent independently rather than continue without adequate support.  Patient also discussed upcoming medical appointments for an iron  infusion and colonoscopy and stated  she may cancel them, feeling her psychiatric stability needs to be addressed first.  Suicidal/Homicidal: No without intent/plan  Therapist Response: Patient is progressing AEB engaging in scheduled therapy session.  Throughout the session, CSW gave patient the opportunity to explore thoughts and feelings associated with current life situations and past/present stressors.   CSW challenged patient gently and appropriately to consider different ways of looking at reported issues. CSW encouraged patients expression of feelings and validated these using empathy, active listening, open body language, and unconditional positive regard.   Patient is experiencing a significant exacerbation of depressive and anxiety symptoms, likely influenced by recent illness, sleep deprivation, occupational stress, and limited perceived support. Presentation included tearfulness, hopeless cognitions, somatic anxiety symptoms, impaired sleep and appetite, and emotional exhaustion. Insight was intact, and judgment appeared fair. No suicidal or homicidal ideation reported at this time. Symptoms suggest possible panic attacks and major depressive features, with patient appropriately requesting psychiatric evaluation.      Plan/Recommendations:  Return to therapy at next scheduled appointment on 1/27, reflect on what was discussed in session, engage in self care behaviors as explored in session, do homework as assigned (go to doctor appointment with open mind about medicine), and return to next session prepared to talk about experience with new coping methods.  CSW provided emotional support and normalization of patients distress in the context of cumulative stressors. Psychoeducation provided regarding anxiety and panic symptoms, including the mind-body response. Encouraged patient to pursue referral to psychiatry for medication evaluation and stabilization. Discussed importance of sleep, nutrition, and basic self-care; collaboratively  identified small, achievable steps to support eating and rest. Explored boundaries with family and co-parenting expectations, reinforcing patients right to request support and clarity. Continued monitoring of mood, anxiety, sleep, and  safety planned. Patient will continue with regular therapy sessions, and CSW will reassess risk and symptom severity ongoing.  Diagnosis:  Major depressive disorder, recurrent episode, moderate degree (HCC)  Generalized anxiety disorder  Collaboration of Care: Primary Care Provider AEB - can see that patient is in therapy although may not be able to read notes  Patient/Guardian was advised Release of Information must be obtained prior to any record release in order to collaborate their care with an outside provider. Patient/Guardian was advised if they have not already done so to contact the registration department to sign all necessary forms in order for us  to release information regarding their care.   Consent: Patient/Guardian gives verbal consent for treatment and assignment of benefits for services provided during this visit. Patient/Guardian expressed understanding and agreed to proceed.   Elgie JINNY Crest, LCSW 06/25/2024
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
## 2024-07-04 ENCOUNTER — Encounter: Payer: Self-pay | Admitting: Psychiatry

## 2024-07-04 ENCOUNTER — Ambulatory Visit: Admitting: Psychiatry

## 2024-07-04 ENCOUNTER — Other Ambulatory Visit: Payer: Self-pay

## 2024-07-04 VITALS — BP 135/89 | HR 87 | Temp 97.8°F | Ht 68.0 in | Wt 223.6 lb

## 2024-07-04 DIAGNOSIS — F331 Major depressive disorder, recurrent, moderate: Secondary | ICD-10-CM | POA: Diagnosis not present

## 2024-07-04 DIAGNOSIS — F411 Generalized anxiety disorder: Secondary | ICD-10-CM

## 2024-07-04 NOTE — Patient Instructions (Signed)
 Next appointment- 3/10 at 2:30

## 2024-07-05 ENCOUNTER — Telehealth: Payer: Self-pay | Admitting: Oncology

## 2024-07-05 NOTE — Telephone Encounter (Signed)
 Pt lvm to cancel all appts in march. I called pt back and lvm to confirm that appts have been canceled

## 2024-07-06 ENCOUNTER — Other Ambulatory Visit: Payer: Self-pay | Admitting: Family

## 2024-07-09 ENCOUNTER — Ambulatory Visit (INDEPENDENT_AMBULATORY_CARE_PROVIDER_SITE_OTHER): Admitting: Clinical

## 2024-07-09 ENCOUNTER — Encounter (HOSPITAL_COMMUNITY): Payer: Self-pay | Admitting: Clinical

## 2024-07-09 DIAGNOSIS — F331 Major depressive disorder, recurrent, moderate: Secondary | ICD-10-CM | POA: Diagnosis not present

## 2024-07-09 DIAGNOSIS — F411 Generalized anxiety disorder: Secondary | ICD-10-CM | POA: Diagnosis not present

## 2024-07-09 NOTE — Progress Notes (Signed)
 THERAPIST PROGRESS NOTE  Session Time: 8:02am-9:02am   Session #24  Virtual Visit via Video Note  I connected with Penny Gonzalez on 07/09/24 at  8:00 AM EST by a video enabled telemedicine application and verified that I am speaking with the correct person using two identifiers.  Location: Patient: home Provider: home office   I discussed the limitations of evaluation and management by telemedicine and the availability of in person appointments. The patient expressed understanding and agreed to proceed.  I discussed the assessment and treatment plan with the patient. The patient was provided an opportunity to ask questions and all were answered. The patient agreed with the plan and demonstrated an understanding of the instructions.   The patient was advised to call back or seek an in-person evaluation if the symptoms worsen or if the condition fails to improve as anticipated.  I provided 60 minutes of non-face-to-face time during this encounter  Elgie JINNY Crest, LCSW   Participation Level: Active  Behavioral Response: Casual Alert Anxious and Euthymic   Type of Therapy: Individual Therapy  Treatment Goals addressed:  STG: Identify and decrease cognitive distortions contributing negatively to mood and behavior by identifying 5-7 cognitive distortions that are present; learn how to come up with replacement thoughts that are more balanced, realistic, and helpful.    LTG: Explore personal core beliefs, rules and assumptions, and cognitive distortions through therapist using Cognitive Behavioral Therapy; learn about Behavioral Activation and Acting As If.   STG: Learn and practice communication techniques such as I statements, open-ended questions, reflective listening, assertiveness, fair fighting rules, active listening, initiating conversations, and more as necessary and taught in session.    STG: Learn about detachment from loved one's negative behaviors to be  healthier emotionally, physically, and spiritually herself and work to detach from loved ones' poor choices.   LTG: Score less than 9 on the PHQ-9 and less than 5 on the GAD-7 as evidenced by intermittent administration of the questionnaires to determine progress in managing depression and anxiety.  STG:  Process life events to the extent needed so that will be able to move forward with various areas of life in a better frame of mind per self-report of improved satisfaction with life 5 out of 7 days over the next 6 months.  LTG: Learn a variety of breathing techniques and grounding strategies, practice in session then report independent application out of session 2-4 times per month or more often, if needed.  STG: Learn about 3 boundary styles and 6-8 types, how to implement them, and how to enforce them, then report feeling more empowered and content with being able to maintain more helpful, appropriate boundaries in the future for a more balanced result.  LTG: Improve self-esteem by engaging in daily affirmations, developing new skills, gratitude journaling, use of SMART goals, increased assertiveness, challenging negative beliefs, and focusing on what patient can control  LTG: Work to learn 10+ coping skills from models like CBT, Stages of Change, DBT, shame resilience theory, ACT, SFBT, MI, trauma-informed therapy and others to be able to manage mental health symptoms, AEB practicing out of session and reporting back.  ProgressTowards Goals: Progressing  Interventions: Assertiveness Training and Supportive   Summary: Penny Gonzalez is a 29 y.o. female who presents with post-partum depression and anxiety for therapy.  Patient reported feeling a little better since the last session and stated that, with the psychiatrists support, she is not ready to begin psychiatric medication at this time, preferring to continue  managing anxiety and panic attacks through coping strategies and skills. She  reflected that she often becomes involved in other peoples drama and initially stated she should isolate herself; however, insight developed that not all situations are problems unless she internalizes them as such. Patient shared that she recently saw her estranged best friend and that the interaction was positive, though she is not ready to resume the relationship as it previously existed. She stated, I have learned my boundaries and I love that about myself. This was pointed out as a great step of growth.  Patient described making intentional choices not to allow her friends personal news to emotionally control her. She was also praised for positive self-care, including choosing to nap while her son napped. Patient shared additional examples of peripheral involvement in others crises, including her deported brother being shot and her ex-boyfriends mother experiencing a mental health breakdown, and described maintaining appropriate emotional boundaries in these situations. She demonstrated progress by showing CSW that she has begun decorating her own room in her new apartment.  CSW reviewed boundary styles and boundary types using on-screen handouts to reinforce learning; materials were also sent via email due to patient not recalling prior receipt. CSW discussed the concept of healthy detachment and forwarded an [employer] brochure for additional support and education.  Suicidal/Homicidal: No without intent/plan  Therapist Response: Patient demonstrated increased insight into boundary-setting, emotional responsibility, and healthy detachment. She is showing improved ability to differentiate between her own responsibilities and those of others, resulting in reduced emotional reactivity. Mood appeared improved compared to prior session. Anxiety remains present but is being managed more effectively through intentional coping strategies. No safety concerns reported      Plan/Recommendations:  Return to  therapy at next scheduled appointment on 2/10, reflect on what was discussed in session, engage in self care behaviors as explored in session.  Continue therapy focused on anxiety management, boundary maintenance, and emotional regulation. Patient will continue practicing coping skills and self-care strategies between sessions.  Diagnosis:  GAD (generalized anxiety disorder)  MDD (major depressive disorder), recurrent episode, moderate (HCC)  Collaboration of Care: Primary Care Provider AEB - can see that patient is in therapy although may not be able to read notes  Patient/Guardian was advised Release of Information must be obtained prior to any record release in order to collaborate their care with an outside provider. Patient/Guardian was advised if they have not already done so to contact the registration department to sign all necessary forms in order for us  to release information regarding their care.   Consent: Patient/Guardian gives verbal consent for treatment and assignment of benefits for services provided during this visit. Patient/Guardian expressed understanding and agreed to proceed.   Elgie JINNY Crest, LCSW 07/09/2024

## 2024-07-23 ENCOUNTER — Ambulatory Visit (HOSPITAL_COMMUNITY): Admitting: Clinical

## 2024-08-12 ENCOUNTER — Inpatient Hospital Stay

## 2024-08-13 ENCOUNTER — Inpatient Hospital Stay: Admitting: Oncology

## 2024-08-13 ENCOUNTER — Inpatient Hospital Stay

## 2024-08-14 ENCOUNTER — Ambulatory Visit (HOSPITAL_COMMUNITY): Admitting: Clinical

## 2024-08-20 ENCOUNTER — Ambulatory Visit: Admitting: Psychiatry
